# Patient Record
Sex: Female | Born: 1935 | Race: White | Hispanic: No | Smoking: Former smoker
Health system: Southern US, Community
[De-identification: ages and names within clinical notes are randomized; demographics above are authoritative.]

## PROBLEM LIST (undated history)

## (undated) DIAGNOSIS — G250 Essential tremor: Secondary | ICD-10-CM

## (undated) DIAGNOSIS — N816 Rectocele: Secondary | ICD-10-CM

## (undated) DIAGNOSIS — Z85828 Personal history of other malignant neoplasm of skin: Secondary | ICD-10-CM

## (undated) DIAGNOSIS — K5730 Diverticulosis of large intestine without perforation or abscess without bleeding: Secondary | ICD-10-CM

## (undated) DIAGNOSIS — R112 Nausea with vomiting, unspecified: Secondary | ICD-10-CM

## (undated) DIAGNOSIS — R579 Shock, unspecified: Secondary | ICD-10-CM

## (undated) DIAGNOSIS — F411 Generalized anxiety disorder: Secondary | ICD-10-CM

## (undated) DIAGNOSIS — G459 Transient cerebral ischemic attack, unspecified: Secondary | ICD-10-CM

## (undated) DIAGNOSIS — E538 Deficiency of other specified B group vitamins: Secondary | ICD-10-CM

## (undated) DIAGNOSIS — I480 Paroxysmal atrial fibrillation: Secondary | ICD-10-CM

## (undated) DIAGNOSIS — I10 Essential (primary) hypertension: Secondary | ICD-10-CM

## (undated) DIAGNOSIS — I442 Atrioventricular block, complete: Secondary | ICD-10-CM

## (undated) DIAGNOSIS — K219 Gastro-esophageal reflux disease without esophagitis: Secondary | ICD-10-CM

## (undated) DIAGNOSIS — Z87898 Personal history of other specified conditions: Secondary | ICD-10-CM

## (undated) DIAGNOSIS — Z9889 Other specified postprocedural states: Secondary | ICD-10-CM

## (undated) HISTORY — PX: TUBAL LIGATION: SHX77

## (undated) HISTORY — DX: Deficiency of other specified B group vitamins: E53.8

## (undated) HISTORY — DX: Generalized anxiety disorder: F41.1

## (undated) HISTORY — DX: Transient cerebral ischemic attack, unspecified: G45.9

## (undated) HISTORY — DX: Essential (primary) hypertension: I10

## (undated) HISTORY — DX: Atrioventricular block, complete: I44.2

## (undated) HISTORY — PX: CATARACT EXTRACTION W/ INTRAOCULAR LENS  IMPLANT, BILATERAL: SHX1307

## (undated) HISTORY — DX: Personal history of other specified conditions: Z87.898

## (undated) HISTORY — PX: OTHER SURGICAL HISTORY: SHX169

## (undated) HISTORY — DX: Paroxysmal atrial fibrillation: I48.0

## (undated) HISTORY — PX: COLONOSCOPY: SHX174

## (undated) HISTORY — DX: Shock, unspecified: R57.9

---

## 1986-03-03 HISTORY — PX: BREAST EXCISIONAL BIOPSY: SUR124

## 1993-03-03 HISTORY — PX: BREAST EXCISIONAL BIOPSY: SUR124

## 1997-10-18 ENCOUNTER — Other Ambulatory Visit: Admission: RE | Admit: 1997-10-18 | Discharge: 1997-10-18 | Payer: Self-pay | Admitting: Obstetrics and Gynecology

## 1998-05-18 ENCOUNTER — Encounter: Payer: Self-pay | Admitting: Obstetrics and Gynecology

## 1998-05-18 ENCOUNTER — Ambulatory Visit (HOSPITAL_COMMUNITY): Admission: RE | Admit: 1998-05-18 | Discharge: 1998-05-18 | Payer: Self-pay | Admitting: Obstetrics and Gynecology

## 1998-05-31 ENCOUNTER — Other Ambulatory Visit: Admission: RE | Admit: 1998-05-31 | Discharge: 1998-05-31 | Payer: Self-pay | Admitting: Obstetrics and Gynecology

## 1998-10-01 ENCOUNTER — Ambulatory Visit (HOSPITAL_COMMUNITY): Admission: RE | Admit: 1998-10-01 | Discharge: 1998-10-01 | Payer: Self-pay | Admitting: Obstetrics and Gynecology

## 1998-10-01 ENCOUNTER — Encounter: Payer: Self-pay | Admitting: Obstetrics and Gynecology

## 1998-10-05 ENCOUNTER — Other Ambulatory Visit: Admission: RE | Admit: 1998-10-05 | Discharge: 1998-10-05 | Payer: Self-pay | Admitting: Obstetrics and Gynecology

## 1998-10-05 ENCOUNTER — Encounter (INDEPENDENT_AMBULATORY_CARE_PROVIDER_SITE_OTHER): Payer: Self-pay | Admitting: Specialist

## 1999-05-21 ENCOUNTER — Encounter: Payer: Self-pay | Admitting: Obstetrics and Gynecology

## 1999-05-21 ENCOUNTER — Encounter: Admission: RE | Admit: 1999-05-21 | Discharge: 1999-05-21 | Payer: Self-pay | Admitting: Obstetrics and Gynecology

## 1999-05-24 ENCOUNTER — Other Ambulatory Visit: Admission: RE | Admit: 1999-05-24 | Discharge: 1999-05-24 | Payer: Self-pay | Admitting: Obstetrics and Gynecology

## 2000-05-15 ENCOUNTER — Other Ambulatory Visit: Admission: RE | Admit: 2000-05-15 | Discharge: 2000-05-15 | Payer: Self-pay | Admitting: Obstetrics and Gynecology

## 2000-05-22 ENCOUNTER — Encounter: Payer: Self-pay | Admitting: Obstetrics and Gynecology

## 2000-05-22 ENCOUNTER — Encounter: Admission: RE | Admit: 2000-05-22 | Discharge: 2000-05-22 | Payer: Self-pay | Admitting: Obstetrics and Gynecology

## 2000-05-26 ENCOUNTER — Encounter: Payer: Self-pay | Admitting: Obstetrics and Gynecology

## 2000-05-26 ENCOUNTER — Encounter: Admission: RE | Admit: 2000-05-26 | Discharge: 2000-05-26 | Payer: Self-pay | Admitting: Obstetrics and Gynecology

## 2001-05-17 ENCOUNTER — Other Ambulatory Visit: Admission: RE | Admit: 2001-05-17 | Discharge: 2001-05-17 | Payer: Self-pay | Admitting: Obstetrics and Gynecology

## 2001-05-28 ENCOUNTER — Encounter: Admission: RE | Admit: 2001-05-28 | Discharge: 2001-05-28 | Payer: Self-pay | Admitting: Obstetrics and Gynecology

## 2001-05-28 ENCOUNTER — Encounter: Payer: Self-pay | Admitting: Obstetrics and Gynecology

## 2001-12-29 ENCOUNTER — Encounter (INDEPENDENT_AMBULATORY_CARE_PROVIDER_SITE_OTHER): Payer: Self-pay | Admitting: Specialist

## 2001-12-29 ENCOUNTER — Ambulatory Visit (HOSPITAL_BASED_OUTPATIENT_CLINIC_OR_DEPARTMENT_OTHER): Admission: RE | Admit: 2001-12-29 | Discharge: 2001-12-29 | Payer: Self-pay | Admitting: *Deleted

## 2002-04-20 ENCOUNTER — Ambulatory Visit (HOSPITAL_COMMUNITY): Admission: RE | Admit: 2002-04-20 | Discharge: 2002-04-20 | Payer: Self-pay | Admitting: Ophthalmology

## 2002-05-04 ENCOUNTER — Ambulatory Visit (HOSPITAL_COMMUNITY): Admission: RE | Admit: 2002-05-04 | Discharge: 2002-05-04 | Payer: Self-pay | Admitting: *Deleted

## 2002-05-20 ENCOUNTER — Ambulatory Visit (HOSPITAL_COMMUNITY): Admission: RE | Admit: 2002-05-20 | Discharge: 2002-05-20 | Payer: Self-pay | Admitting: Obstetrics and Gynecology

## 2002-05-20 ENCOUNTER — Encounter: Payer: Self-pay | Admitting: Obstetrics and Gynecology

## 2002-05-31 ENCOUNTER — Encounter: Payer: Self-pay | Admitting: Obstetrics and Gynecology

## 2002-05-31 ENCOUNTER — Encounter: Admission: RE | Admit: 2002-05-31 | Discharge: 2002-05-31 | Payer: Self-pay | Admitting: Obstetrics and Gynecology

## 2003-06-02 ENCOUNTER — Encounter: Admission: RE | Admit: 2003-06-02 | Discharge: 2003-06-02 | Payer: Self-pay | Admitting: Surgery

## 2004-06-03 ENCOUNTER — Encounter: Admission: RE | Admit: 2004-06-03 | Discharge: 2004-06-03 | Payer: Self-pay | Admitting: Obstetrics and Gynecology

## 2005-06-05 ENCOUNTER — Encounter: Admission: RE | Admit: 2005-06-05 | Discharge: 2005-06-05 | Payer: Self-pay | Admitting: Obstetrics and Gynecology

## 2006-06-08 ENCOUNTER — Encounter: Admission: RE | Admit: 2006-06-08 | Discharge: 2006-06-08 | Payer: Self-pay | Admitting: Obstetrics and Gynecology

## 2007-06-10 ENCOUNTER — Encounter: Admission: RE | Admit: 2007-06-10 | Discharge: 2007-06-10 | Payer: Self-pay | Admitting: Obstetrics and Gynecology

## 2007-07-23 ENCOUNTER — Encounter: Admission: RE | Admit: 2007-07-23 | Discharge: 2007-07-23 | Payer: Self-pay | Admitting: Obstetrics and Gynecology

## 2007-07-27 ENCOUNTER — Encounter (INDEPENDENT_AMBULATORY_CARE_PROVIDER_SITE_OTHER): Payer: Self-pay | Admitting: Obstetrics and Gynecology

## 2007-07-27 ENCOUNTER — Ambulatory Visit (HOSPITAL_BASED_OUTPATIENT_CLINIC_OR_DEPARTMENT_OTHER): Admission: RE | Admit: 2007-07-27 | Discharge: 2007-07-27 | Payer: Self-pay | Admitting: Obstetrics and Gynecology

## 2007-11-26 ENCOUNTER — Encounter: Admission: RE | Admit: 2007-11-26 | Discharge: 2007-11-26 | Payer: Self-pay | Admitting: Obstetrics and Gynecology

## 2008-06-12 ENCOUNTER — Encounter: Admission: RE | Admit: 2008-06-12 | Discharge: 2008-06-12 | Payer: Self-pay | Admitting: Obstetrics and Gynecology

## 2009-06-13 ENCOUNTER — Encounter: Admission: RE | Admit: 2009-06-13 | Discharge: 2009-06-13 | Payer: Self-pay | Admitting: Internal Medicine

## 2010-03-03 DIAGNOSIS — Z9889 Other specified postprocedural states: Secondary | ICD-10-CM

## 2010-03-03 DIAGNOSIS — Z85828 Personal history of other malignant neoplasm of skin: Secondary | ICD-10-CM

## 2010-03-03 HISTORY — DX: Personal history of other malignant neoplasm of skin: Z85.828

## 2010-03-03 HISTORY — DX: Personal history of other malignant neoplasm of skin: Z98.890

## 2010-05-07 ENCOUNTER — Other Ambulatory Visit: Payer: Self-pay | Admitting: Internal Medicine

## 2010-05-07 DIAGNOSIS — Z1231 Encounter for screening mammogram for malignant neoplasm of breast: Secondary | ICD-10-CM

## 2010-06-11 ENCOUNTER — Ambulatory Visit (HOSPITAL_COMMUNITY)
Admission: RE | Admit: 2010-06-11 | Discharge: 2010-06-11 | Disposition: A | Discharge status: Home / Self Care | Payer: MEDICARE | Source: Ambulatory Visit | Attending: Surgery | Admitting: Surgery

## 2010-06-11 DIAGNOSIS — Z8371 Family history of colonic polyps: Secondary | ICD-10-CM | POA: Insufficient documentation

## 2010-06-11 DIAGNOSIS — Z83719 Family history of colon polyps, unspecified: Secondary | ICD-10-CM | POA: Insufficient documentation

## 2010-06-11 DIAGNOSIS — Z1211 Encounter for screening for malignant neoplasm of colon: Secondary | ICD-10-CM | POA: Insufficient documentation

## 2010-06-11 DIAGNOSIS — K5730 Diverticulosis of large intestine without perforation or abscess without bleeding: Secondary | ICD-10-CM | POA: Insufficient documentation

## 2010-06-17 ENCOUNTER — Ambulatory Visit
Admission: RE | Admit: 2010-06-17 | Discharge: 2010-06-17 | Disposition: A | Discharge status: Home / Self Care | Payer: MEDICARE | Source: Ambulatory Visit | Attending: Internal Medicine | Admitting: Internal Medicine

## 2010-06-17 DIAGNOSIS — Z1231 Encounter for screening mammogram for malignant neoplasm of breast: Secondary | ICD-10-CM

## 2010-06-17 NOTE — Op Note (Signed)
  NAMEDEVERY, ODWYER                ACCOUNT NO.:  [id number]  MEDICAL RECORD NO.:  [id number]           PATIENT TYPE:  O  LOCATION:  [facility]                         FACILITY:  [facility]  PHYSICIAN:  Sandria Bales. Ezzard Standing, M.D.  DATE OF BIRTH:  November 19, 1932  DATE OF PROCEDURE:  06/11/2010                              OPERATIVE REPORT   POSTOPERATIVE DIAGNOSIS:  Screening colonoscopy.  POSTOPERATIVE DIAGNOSIS:  Significant sigmoid colonic diverticulosis with rare scattered diverticula throughout colon. No mucosal lesions.  PROCEDURE:  Flexible colonoscopy.  SURGEON:  Sandria Bales. Ezzard Standing, M.D.  ANESTHESIA:  A 75 mcg of fentanyl, 6 mg of Versed.  COMPLICATIONS:  None.  INDICATIONS FOR PROCEDURE:  Ms. Sangiovanni is a 77 year old white female, patient of Dr. Buren Kos, who comes for colonoscopy.  She has never had a complete colonoscopy, she has had a sigmoidoscopy in the past. She has a son who has colonic polyps and I think this has prompted her screening colonoscopy.  The indications, potential complications were explained to the patient. The potential complications include, but are not limited to, perforation of bowel and bleeding if we were to remove a polyp.  She has completed a HalfLytely prep at home.  OPERATIVE PROCEDURE:  The patient was placed in left lateral decubitus position with IV in her right arm.  She was on 2 L nasal O2 and monitored with a pulse oximetry, EKG and blood pressure cuff.  A time out was held.  I used the Pentax flexible colonoscope and passed this upper rectum.  I had some difficulty negotiating her sigmoid colon in that she had significant sigmoid colonic diverticulosis with a fair amount of spasm of the colon wall but I was able to get through the sigmoid colon and get around to the cecum.    I visualized the ileocecal valve and cecum. The right colon had one or two scattered diverticula.  The transverse colon was unremarkable.  Left colon was unremarkable  except for an occasional diverticula.  The sigmoid colon had significant sigmoid colon diverticulosis with colonic spasm and her rectum was unremarkable.  There was, however, no mucosal lesion or polyp identified.    She is 77 years of age.  She could have her next colonoscopy in 10 years depending a lot on her overall health.  CONDITION ON DISCHARGE:  Discharge condition is good.   Sandria Bales. Ezzard Standing, M.D., FACS   DHN/MEDQ  D:  06/11/2010  T:  06/11/2010  Job:  [id number]  cc:   Kari Baars, M.D. Fax: [phone]  Malachi Pro. Ambrose Mantle, M.D. Fax: [phone]  Electronically Signed by Ovidio Kin M.D. on 06/16/2010 12:32:48 PM

## 2010-07-16 NOTE — Op Note (Signed)
Charlene Dickerson, Charlene Dickerson                ACCOUNT NO.:  [id number]   MEDICAL RECORD NO.:  [id number]          PATIENT TYPE:  AMB   LOCATION:  [facility]                         FACILITY:  [facility]   PHYSICIAN:  Malachi Pro. Ambrose Mantle, M.D. DATE OF BIRTH:  Apr 06, 1932   DATE OF PROCEDURE:  07/27/2007  DATE OF DISCHARGE:                               OPERATIVE REPORT   PREOPERATIVE DIAGNOSIS:  Cervical stenosis, endometrial lesion.   POSTOPERATIVE DIAGNOSIS:  Endometrial polyp and what appeared to be a  solid lesion on the right anterior endometrial surface.   OPERATION:  D&C hysteroscopy, removal of endometrial polyp, resection of  the right anterior endometrial lesion.   OPERATOR:  Malachi Pro. Ambrose Mantle, M.D.   ANESTHESIA:  General anesthesia.   DESCRIPTION OF PROCEDURE:  The patient was brought to the operating room  and given general anesthesia.  She was placed in lithotomy position.  The vulva, vagina and perineum were prepped with Betadine solution.  There was a moderate uterine prolapse.  The cervix came to the introitus  and there was a significant rectocele.  The rectocele was probably  second to third degree.  Exam revealed the uterus to be anterior and  normal-sized.  The adnexa were free of masses.  The cervix were quite  stenotic.  The area was then draped as a sterile field with a collection  chamber underneath the buttocks to collect the sorbitol.  The cervix was  drawn into the operative field after weighted speculum was placed  posteriorly and there was a dimple where the cervix had been.   I initially tried with a #7 Hank dilator and could not get any entry  into the endometrial cavity.  I asked for smaller dilators and adult  flexible dilators which were not effective.  So I tried again with a #7  Hank and this time I was able to enter the endometrial cavity.  I then  dilated the cervix progressively to a 30 Hank dilator and looked with  the hysteroscope and there was a large polyp  filling the entire  endometrial cavity.  I initially tried to remove it with the polyp  forceps.  That was ineffective.  I tried with the uterine dressing  forceps.  That was ineffective and finally with the small ring forceps,  I was able to grasp the polyp and pull it out as basically 1piece.  The  polyp measured about 2.5 cm long x 1-1.5 cm wide and 1.5 cm thick.   I then took another look with the hysteroscope, was able to see the  entire endometrial cavity at this point and there was a white lesion  just under the endometrial surface on the right anterior part of the  endometrium.  This appeared to be a fibroid but I went ahead and dilated  the cervix up more with Shawnie Pons dilators and used the resectoscope to  remove this lesion with a cutting current.  There was never any  bleeding.  I removed it in 3 pieces.  I then did an  endocervical curettage followed by an endometrial curettage.  The cavity  of the uterus appeared completely atrophic and without any lesions.  At  this point a picture was taken and the procedure was terminated.  Blood  loss was negligible.  The patient was returned to recovery in  satisfactory condition.      Malachi Pro. Ambrose Mantle, M.D.  Electronically Signed     TFH/MEDQ  D:  07/27/2007  T:  07/27/2007  Job:  [id number]

## 2010-07-18 ENCOUNTER — Encounter (INDEPENDENT_AMBULATORY_CARE_PROVIDER_SITE_OTHER): Payer: Self-pay | Admitting: Surgery

## 2010-11-27 LAB — COMPREHENSIVE METABOLIC PANEL
ALT: 23
Calcium: 9.9
Creatinine, Ser: 0.74
GFR calc non Af Amer: >60
Glucose, Bld: 98
Sodium: 139
Total Protein: 6.4

## 2010-11-27 LAB — CBC
Hemoglobin: 14.6
MCHC: 34.8
MCV: 96.2
RDW: 13.4

## 2011-03-28 ENCOUNTER — Ambulatory Visit (INDEPENDENT_AMBULATORY_CARE_PROVIDER_SITE_OTHER): Payer: Self-pay | Admitting: Surgery

## 2011-05-09 ENCOUNTER — Ambulatory Visit (INDEPENDENT_AMBULATORY_CARE_PROVIDER_SITE_OTHER): Payer: Medicare Other | Admitting: Surgery

## 2011-05-09 ENCOUNTER — Encounter (INDEPENDENT_AMBULATORY_CARE_PROVIDER_SITE_OTHER): Payer: Self-pay | Admitting: Surgery

## 2011-05-09 ENCOUNTER — Other Ambulatory Visit: Payer: Self-pay | Admitting: Internal Medicine

## 2011-05-09 VITALS — BP 146/88 | HR 70 | Temp 97.9°F | Resp 18 | Ht 62.5 in | Wt 149.0 lb

## 2011-05-09 DIAGNOSIS — Z1231 Encounter for screening mammogram for malignant neoplasm of breast: Secondary | ICD-10-CM

## 2011-05-09 DIAGNOSIS — N6019 Diffuse cystic mastopathy of unspecified breast: Secondary | ICD-10-CM

## 2011-05-09 HISTORY — DX: Diffuse cystic mastopathy of unspecified breast: N60.19

## 2011-05-09 NOTE — Progress Notes (Signed)
[facility]  Charlene Kin, MD,  FACS [address] [place]  [address] [place], [place]    [address] Phone:  [phone] FAX:  [phone]   Re:   Charlene Dickerson DOB:   10-Dec-1932 MRN:   [id number]  ASSESSMENT AND PLAN: 1.  Diffuse mastopathy.  Neg mammogram April 2012, she is up for mammogram next month.  Doing well.  She will see me in one year.  2.  Basal cell removed from right wrist - 02/11/2010  HISTORY OF PRESENT ILLNESS: Chief Complaint  Patient presents with  . Breast Cancer Long Term Follow Up  . Follow-up    Right wrist lesion    Charlene Dickerson is a 78 y.o. (DOB: October 07, 1932)  white female who is a patient of Charlene Baars, MD, MD and comes to me today for breast evaluation. She has a family history of breast cancer in which a sister and a daughter have had breast cancer.  Her last breast biopsy was by Charlene Dickerson of the left breast in 05/31/1993 and showed fibrocystic changes.  She is doing well.  She has no new mass or concern.  She is in good spirits.  Her son, Charlene Dickerson, had a 5 vessel bypass about 6 months ago. He's having some sternal problems now. We talked a while about Charlene Dickerson. She brought me some candy.  PHYSICAL EXAM: BP 146/88  Pulse 70  Temp(Src) 97.9 F (36.6 C) (Temporal)  Resp 18  Ht 5' 2.5" (1.588 m)  Wt 149 lb (67.586 kg)  BMI 26.82 kg/m2  HEENT:  Pupils equal.  Dentition good.  No injury. NECK:  Supple.  No thyroid mass. LYMPH NODES:  No cervical, supraclavicular, or axillary adenopathy. BREASTS -  RIGHT:  No palpable mass or nodule.  No nipple discharge.   LEFT:  No palpable mass or nodule.  No nipple discharge. UPPER EXTREMITIES:  No evidence of lymphedema.  DATA REVIEWED: Mammogram- 06/17/2010 - neg.  She gets her mammograms in April.   Charlene Kin, MD, FACS Office:  [phone]

## 2011-06-18 ENCOUNTER — Ambulatory Visit
Admission: RE | Admit: 2011-06-18 | Discharge: 2011-06-18 | Disposition: A | Discharge status: Home / Self Care | Payer: PRIVATE HEALTH INSURANCE | Source: Ambulatory Visit | Attending: Internal Medicine | Admitting: Internal Medicine

## 2011-06-18 DIAGNOSIS — Z1231 Encounter for screening mammogram for malignant neoplasm of breast: Secondary | ICD-10-CM

## 2012-05-17 ENCOUNTER — Other Ambulatory Visit: Payer: Self-pay

## 2012-05-17 DIAGNOSIS — Z1231 Encounter for screening mammogram for malignant neoplasm of breast: Secondary | ICD-10-CM

## 2012-06-18 ENCOUNTER — Ambulatory Visit
Admission: RE | Admit: 2012-06-18 | Discharge: 2012-06-18 | Disposition: A | Discharge status: Home / Self Care | Payer: Medicare Other | Source: Ambulatory Visit

## 2012-06-18 DIAGNOSIS — Z1231 Encounter for screening mammogram for malignant neoplasm of breast: Secondary | ICD-10-CM

## 2012-06-21 ENCOUNTER — Other Ambulatory Visit: Payer: Self-pay | Admitting: Internal Medicine

## 2012-06-21 DIAGNOSIS — R928 Other abnormal and inconclusive findings on diagnostic imaging of breast: Secondary | ICD-10-CM

## 2012-07-01 ENCOUNTER — Ambulatory Visit
Admission: RE | Admit: 2012-07-01 | Discharge: 2012-07-01 | Disposition: A | Discharge status: Home / Self Care | Payer: Medicare Other | Source: Ambulatory Visit | Attending: Internal Medicine | Admitting: Internal Medicine

## 2012-07-01 DIAGNOSIS — R928 Other abnormal and inconclusive findings on diagnostic imaging of breast: Secondary | ICD-10-CM

## 2012-12-01 ENCOUNTER — Encounter (INDEPENDENT_AMBULATORY_CARE_PROVIDER_SITE_OTHER): Payer: Self-pay | Admitting: Surgery

## 2012-12-01 ENCOUNTER — Ambulatory Visit (INDEPENDENT_AMBULATORY_CARE_PROVIDER_SITE_OTHER): Payer: Medicare Other | Admitting: Surgery

## 2012-12-01 VITALS — BP 130/76 | HR 67 | Temp 98.0°F | Resp 18 | Ht 63.0 in | Wt 150.0 lb

## 2012-12-01 DIAGNOSIS — N6019 Diffuse cystic mastopathy of unspecified breast: Secondary | ICD-10-CM

## 2012-12-01 NOTE — Progress Notes (Addendum)
[facility]  Ovidio Kin, MD,  FACS [address] [place]  [address] [place], [place]    [address] Phone:  [phone] FAX:  [phone]   Re:   Charlene Dickerson DOB:   1933/01/09 MRN:   [id number]  ASSESSMENT AND PLAN: 1.  Diffuse mastopathy.  Neg mammogram 08/03/12.  Doing well.  She will see me in one year.  2.  Basal cell removed from right wrist - 02/11/2010 3.  Crossed toes with nail impingement on the right.  HISTORY OF PRESENT ILLNESS: Chief Complaint  Patient presents with  . Breast Cancer Long Term Follow Up    Charlene Dickerson is a 79 y.o. (DOB: 05/22/1932)  white female who is a patient of Kari Baars, MD and comes to me today for breast evaluation.   She is doing well.  She has no new mass or concern.  She also showed me a place on her lateral lower left leg.  Breast History: She has a family history of breast cancer in which a sister and a daughter have had breast cancer.  Her last breast biopsy was by Dr. Elvina Mattes of the left breast in 05/31/1993 and showed fibrocystic changes.  She comes by herselft.  Review of Systems: Neuro:  She has had "far/near" implants for her vision and does not require glasses. GI:  I did a colonoscopy on her on 06/11/2010.  She had some diverticulosis. GYN:  Followed by Dr. Ronnell Freshwater.  Social History: Her husband, Deniece Portela, died August 03, 2008. Her son, [name], had a 5 vessel bypass around October 2012.  Her other son, Freida Busman, had 3 cardiac stents placed in 08-04-10. Her daughter, [name], and her husband, Jennette Kettle, have retired at age 74.  They are going to the beach near [place] to retire. She brought me some candy.  PHYSICAL EXAM: BP 130/76  Pulse 67  Temp(Src) 98 F (36.7 C)  Resp 18  Ht 5\' 3"  (1.6 m)  Wt 150 lb (68.04 kg)  BMI 26.58 kg/m2  HEENT:  Pupils equal.  Dentition good.  No injury.  Has slight head tremor. NECK:  Supple.  No thyroid mass. LYMPH NODES:  No cervical, supraclavicular, or axillary  adenopathy. BREASTS -  RIGHT:  No palpable mass or nodule.  No nipple discharge.  Scar at 9 o'clock.   LEFT:  No palpable mass or nodule.  No nipple discharge.  Scar at 9 o'clock and peri-areolar. UPPER EXTREMITIES:  No evidence of lymphedema.  DATA REVIEWED: Mammogram- 07/01/2012 - Negative.  0.7 cm cyst central left breast.  Ovidio Kin, MD, FACS Office:  [phone]

## 2013-05-23 ENCOUNTER — Other Ambulatory Visit: Payer: Self-pay

## 2013-05-23 DIAGNOSIS — Z1231 Encounter for screening mammogram for malignant neoplasm of breast: Secondary | ICD-10-CM

## 2013-06-20 ENCOUNTER — Ambulatory Visit
Admission: RE | Admit: 2013-06-20 | Discharge: 2013-06-20 | Disposition: A | Discharge status: Home / Self Care | Payer: Medicare Other | Source: Ambulatory Visit

## 2013-06-20 DIAGNOSIS — Z1231 Encounter for screening mammogram for malignant neoplasm of breast: Secondary | ICD-10-CM

## 2014-02-10 ENCOUNTER — Ambulatory Visit: Payer: Medicare Other | Admitting: Rehabilitative and Restorative Service Providers"

## 2014-02-14 ENCOUNTER — Ambulatory Visit: Payer: Medicare Other | Attending: Internal Medicine | Admitting: Rehabilitative and Restorative Service Providers"

## 2014-02-14 ENCOUNTER — Encounter: Payer: Self-pay | Admitting: Rehabilitative and Restorative Service Providers"

## 2014-02-14 DIAGNOSIS — R269 Unspecified abnormalities of gait and mobility: Secondary | ICD-10-CM | POA: Diagnosis present

## 2014-02-14 NOTE — Therapy (Signed)
[facility] [address] [place], [place], [address] Phone: [phone]   Fax:  [phone]  Physical Therapy Evaluation  Patient Details  Name: Charlene Dickerson MRN: [id number] Date of Birth: 07/20/1932  Encounter Date: 02/14/2014      PT End of Session - 02/14/14 1401    Visit Number 1  G code (1)   Number of Visits 8   Date for PT Re-Evaluation 03/16/14   PT Start Time 1145   PT Stop Time 1240   PT Time Calculation (min) 55 min   Activity Tolerance Patient tolerated treatment well      Past Medical History  Diagnosis Date  . Hypertension   . Osteoporosis     Past Surgical History  Procedure Laterality Date  . Breast surgery  1988    breast biopsy  . Breast surgery  1996    breast biopsy  . Basal cell lesion removed  2012    right wrist    There were no vitals taken for this visit.  Visit Diagnosis:  Abnormality of gait - Plan: PT plan of care cert/re-cert      Subjective Assessment - 02/14/14 1150    Symptoms The patient reports sudden onset of vertigo on 12/16/2013 while standing and turning.  Sensation of vertigo was accompanied by imbalance and nausea/vomitting.  Symptoms lasted for 4 hours and then began to improve.  She continues with intermittent spells of dizziness that begin with a sensation of nausea and progress to worsening balance and nausea with some reported dizziness.  Overall, she does not feel a sensation of true vertigo with room spinning.  She also notes tinnitus and a popping sensation in her head with turns.  Her symptoms have gotten worse in the past week and she is having more frequent episodes of instability.  Her dizziness is improved with meclizine and she has started taking it every 8 hours in the past week because she felt her symptoms were occuring more frequently.     Patient Stated Goals "Get rid of vertigo"   Currently in Pain? Yes   Pain Score --  not rated   Pain Location Head   Pain Orientation Right;Posterior   Pain Descriptors / Indicators Pressure   Pain Onset 1 to 4 weeks ago   Pain Frequency Several days a week   Aggravating Factors  Reading a computer use.   Pain Relieving Factors Rest.   Effect of Pain on Daily Activities "Feels like a tension headache"   Multiple Pain Sites No          [facility] PT Assessment - 02/14/14 0001    Balance Screen   Has the patient fallen in the past 6 months No   Has the patient had a decrease in activity level because of a fear of falling?  No   Is the patient reluctant to leave their home because of a fear of falling?  Yes  occasionally   Home Environment   Living Enviornment Private residence   Living Arrangements Alone   Type of Home House   Observation/Other Assessments   Focus on Therapeutic Outcomes (FOTO)  97   Other Surveys  --  DHI=26%   Ambulation/Gait   Ambulation/Gait Yes   Ambulation/Gait Assistance 7: Independent   Ambulation Distance (Feet) --  200   Gait Pattern Within Functional Limits   Standardized Balance Assessment   Standardized Balance Assessment Berg Balance Test   Balance Master Testing Sensory Organization Test  37%  equilibrium score with dependence on ground for balance   Berg Balance Test   Sit to Stand Able to stand without using hands and stabilize independently   Standing Unsupported Able to stand safely 2 minutes   Sitting with Back Unsupported but Feet Supported on Floor or Stool Able to sit safely and securely 2 minutes   Stand to Sit Sits safely with minimal use of hands   Transfers Able to transfer safely, minor use of hands   Standing Unsupported with Eyes Closed Able to stand 10 seconds safely   Standing Ubsupported with Feet Together Able to place feet together independently and stand 1 minute safely   From Standing, Reach Forward with Outstretched Arm Can reach confidently >25 cm (10")   From Standing Position, Pick up Object from Floor Able to pick up shoe safely and  easily   From Standing Position, Turn to Look Behind Over each Shoulder Looks behind from both sides and weight shifts well   Turn 360 Degrees Able to turn 360 degrees safely one side only in 4 seconds or less  Increased time to R with step to recover from loss of balanc   Standing Unsupported, Alternately Place Feet on Step/Stool Able to stand independently and safely and complete 8 steps in 20 seconds   Standing Unsupported, One Foot in Front Able to take small step independently and hold 30 seconds   Standing on One Leg Tries to lift leg/unable to hold 3 seconds but remains standing independently   Total Score 50/56 indicating low fall risk.            PT Education - 02/14/14 1400    Education provided Yes   Education Details Recommended patient take Meclizine as prescribed "as needed" and call MD if further questions on use of meds.  Home walking on level outdoor surfaces to return to prior walking program safely.   Person(s) Educated Patient   Methods Explanation   Comprehension Verbalized understanding          PT Short Term Goals - 02/14/14 1407    PT SHORT TERM GOAL #1   Title The patient will be indep with HEP for gaze x 1 viewing, high level balance and general mobility. Target date 03/16/2014.   Time 4   Period Weeks   PT SHORT TERM GOAL #2   Title The patient will improve sensory organization test from 37% up to 48 % to demo improved use of sensory systems for balance.  Target date 03/16/2014.   Time 4   Period Weeks   PT SHORT TERM GOAL #3   Title The patient will improve single limb stance to > or equal to 5 seconds bilaterally to demo improved balance control.  Target date 03/16/2014.   Time 4   Period Weeks   PT SHORT TERM GOAL #4   Title The patient will return to regular walking program per verbal report.  Target date 03/16/2014.   Time 4   Period Weeks          PT Long Term Goals - 02/14/14 1412    PT LONG TERM GOAL #1   Title The patient will be indep  with post d/c HEP for dynamic gait, high level balance and gaze activities as indicated.  Target date: 04/17/2014.   Time 8   Period Weeks   PT LONG TERM GOAL #2   Title The patient will improve sensory organization testing from 37% up to 55% to demo improving use of  sensory systems for balance.  Target date 04/03/2014.   Time 8   Period Weeks   PT LONG TERM GOAL #3   Title The patient will improve DGI by 4 points to demo improving dynamic gait.  Target date 04/03/2014.   Time 8   Period Weeks   PT LONG TERM GOAL #4   Title The patient will decrease dizziness handicap inventory from 26% down to 15% to demo improving subjective reports of dizziness. Target date 04/03/2014.   Time 8   Period Weeks          Plan - 02/14/14 1402    Clinical Impression Statement The patient is a 80 yo female with sudden onset of dizziness, imbalance and n/v in 12/2013.  Since that time, she has continued with intermittent spells of dizziness, nausea and imbalance that are hindering her abilities to participate in home walking program and regular community/social activities.  At today's evaluation, the patient was negative for positional vertigo, however she took meclizine prior to our session.  Her symptoms were most provoked by multi-sensory environments on the balance master (sensory organization testing) revealing dependence on the surface for balance and moderately diminished use of visual cues for balance with significantly diminished use of vestibular inputs for balance.  PT to reassess positional testing as indicated.  The patient also presented with decreased vestibular ocular reflex (gaze adaptations) noted through inability to gaze fixate during head turns. This may be hindering her ability to turn quickly during functional activities.                                                                                   Pt will benefit from skilled therapeutic intervention in order to improve on the following  deficits Abnormal gait;Decreased balance;Decreased mobility;Difficulty walking   Rehab Potential Good   PT Frequency 1x / week   PT Duration 8 weeks   PT Treatment/Interventions Functional mobility training;Neuromuscular re-education;Gait training;Balance training;Therapeutic exercise;Therapeutic activities;Patient/family education   PT Next Visit Plan Begin HEP: Gaze x 1 adaptation in sitting/standing, corner balance activities on compliant surfaces.  Dynamic gait training, high level balance training in clinic.   Consulted and Agree with Plan of Care Patient          G-Codes - 02/14/14 1414    Functional Assessment Tool Used Berg Balance Scale=50/56.  Sensory organization testing=37% compared to age/height normative value of 65% with significantly reduced use of visual and vestibular inputs for balance.  Dizziness handicap index=26%.     Functional Limitation Mobility: Walking and moving around   Mobility: Walking and Moving Around Current Status [phone](G8978) At least 1 percent but less than 20 percent impaired, limited or restricted   Mobility: Walking and Moving Around Goal Status [phone](G8979) At least 1 percent but less than 20 percent impaired, limited or restricted             Vestibular Assessment - 02/14/14 1202    Type of Dizziness Unsteady with head/body turns   Frequency of Dizziness frequent episodes   Duration of Dizziness minutes to hours   Aggravating Factors Turning body quickly;Turning head quickly   Relieving Factors Head stationary   Occulomotor Alignment Normal  Spontaneous Absent   Gaze-induced Absent   Smooth Pursuits Intact   Saccades Intact   VOR 1 Head Only (x 1 viewing) --  Unable to maintain gaze x 1 viewing with head thrust test Bilat   VOR to Slow Head Movement Positive bilaterally  difficult to maintain gaze on target with head movements   Dix-Hallpike Dix-Hallpike Right;Dix-Hallpike Left   Sidelying Test Sidelying Right;Sidelying Left   Horizontal  Canal Testing Horizontal Canal Right;Horizontal Canal Left   Dix-Hallpike Right Symptoms No nystagmus  No dizziness   Dix-Hallpike Left Symptoms No nystagmus  No dizziness   Sidelying Right Symptoms No nystagmus  no dizziness   Sidelying Left Symptoms No nystagmus  no dizziness   Horizontal Canal Right Symptoms Normal   Horizontal Canal Left Symptoms Normal      SELF CARE/HOME MANAGEMENT: Discussed safe return to home walking program and results from evaluation with recommendations to avoid unlevel ground until after further activity/exercise in the clinic.  Problem List Patient Active Problem List   Diagnosis Date Noted  . Diffuse cystic mastopathy of breast 05/09/2011     Thank you for the referral of this patient.   Margretta Ditty, PT, MPT 02/14/2014 2:21 PM [facility] Phone: [phone] Fax: [phone]  Riann Oman 02/14/2014, 2:17 PM

## 2014-02-22 ENCOUNTER — Encounter: Payer: Self-pay | Admitting: Rehabilitative and Restorative Service Providers"

## 2014-02-22 ENCOUNTER — Ambulatory Visit: Payer: Medicare Other | Admitting: Rehabilitative and Restorative Service Providers"

## 2014-02-22 DIAGNOSIS — R269 Unspecified abnormalities of gait and mobility: Secondary | ICD-10-CM

## 2014-02-22 NOTE — Patient Instructions (Signed)
  Gaze Stabilization: Tip Card 1.Target must remain in focus, not blurry, and appear stationary while head is in motion. 2.Perform exercises with small head movements (45 to either side of midline). 3.Increase speed of head motion so long as target is in focus. 4.If you wear eyeglasses, be sure you can see target through lens (therapist will give specific instructions for bifocal / progressive lenses). 5.These exercises may provoke dizziness or nausea. Work through these symptoms. If too dizzy, slow head movement slightly. Rest between each exercise. 6.Exercises demand concentration; avoid distractions. 7.For safety, perform standing exercises close to a counter, wall, corner, or next to someone.  Copyright  VHI. All rights reserved.  Gaze Stabilization: Standing Feet Apart   Feet shoulder width apart, keeping eyes on target on wall __3__ feet away, tilt head down 15-30 and move head side to side for __30__ seconds.  Do __2__ sessions per day.  Copyright  VHI. All rights reserved.  Feet Together (Compliant Surface) Varied Arm Positions - Eyes Closed   Stand on compliant surface: pillow with feet together and arms by your side. Close eyes and visualize upright position. Hold__30__ seconds. Repeat _3___ times per session. Do _2___ sessions per day.  Copyright  VHI. All rights reserved.  Feet Apart (Compliant Surface) Head Motion - Eyes Open   With eyes open, standing on compliant surface: __pillow_, feet shoulder width apart, move head slowly: side to side. Repeat __5__ times per session. Do _2___ sessions per day.  Copyright  VHI. All rights reserved.  Horizontal Abduction (Resistive Band)   With arms at shoulder level, keep elbows straight. HOLD THERABAND IN YOUR HANDS WITH PALMS UP.  Move hands apart bringing your arms out to the sides.  Repeat _10_ times. Do __2__ sessions per day.  Copyright  VHI. All rights reserved.

## 2014-02-22 NOTE — Therapy (Signed)
[facility] [address] [place], [place], [address] Phone: [phone]   Fax:  [phone]  Physical Therapy Treatment  Patient Details  Name: Charlene Dickerson MRN: [id number] Date of Birth: 10-Jan-1933  Encounter Date: 02/22/2014      PT End of Session - 02/22/14 1503    Visit Number 2  G code (2)   Number of Visits 8   Date for PT Re-Evaluation 03/16/14   PT Start Time 1405   PT Stop Time 1450   PT Time Calculation (min) 45 min   Activity Tolerance Patient tolerated treatment well      Past Medical History  Diagnosis Date  . Hypertension   . Osteoporosis     Past Surgical History  Procedure Laterality Date  . Breast surgery  1988    breast biopsy  . Breast surgery  1996    breast biopsy  . Basal cell lesion removed  2012    right wrist    There were no vitals taken for this visit.  Visit Diagnosis:  Abnormality of gait      Subjective Assessment - 02/22/14 1410    Symptoms The patient reports improvement since last session.  She has not taken any meclizine.  She reports no dizziness with bed mobility.  She has experienced passing symptoms of nausea and dizziness, but they lasted  short periods and cleared quickly.    Currently in Pain? Yes   Pain Score 0-No pain   Pain Location Head   Pain Orientation Right;Posterior   Pain Descriptors / Indicators Pressure   Pain Onset 1 to 4 weeks ago   Pain Frequency Several days a week   Aggravating Factors  reading and computer use.     NEUROMUSCULAR RE-EDUCATION: Gaze x 1 viewing x 30 seconds horiz/vertical x 3 repetitions. Corner balance with feet apart eyes closed, progressing to feet together. Corner balance on foam with head turns. Posture re-education with scapular retraction.  Gait: Ambulation with head turns in horiz and vertical planes without loss of balance. Discussed return to home walking program with patient and she plans to have friend  walk with her when initially [place].           PT Education - 02/22/14 1501    Education provided Yes   Education Details HEP: Gaze x 1 viewing, feet together with eyes closed, feet apart head turns, and scapular retraction.   Person(s) Educated Patient   Methods Explanation   Comprehension Verbalized understanding          PT Short Term Goals - 02/14/14 1407    PT SHORT TERM GOAL #1   Title The patient will be indep with HEP for gaze x 1 viewing, high level balance and general mobility. Target date 03/16/2014.   Time 4   Period Weeks   PT SHORT TERM GOAL #2   Title The patient will improve sensory organization test from 37% up to 48 % to demo improved use of sensory systems for balance.  Target date 03/16/2014.   Time 4   Period Weeks   PT SHORT TERM GOAL #3   Title The patient will improve single limb stance to > or equal to 5 seconds bilaterally to demo improved balance control.  Target date 03/16/2014.   Time 4   Period Weeks   PT SHORT TERM GOAL #4   Title The patient will return to regular walking program per verbal report.  Target date 03/16/2014.   Time 4   Period Weeks           PT Long Term Goals - 02/14/14 1412    PT LONG TERM GOAL #1   Title The patient will be indep with post d/c HEP for dynamic gait, high level balance and gaze activities as indicated.  Target date: 04/17/2014.   Time 8   Period Weeks   PT LONG TERM GOAL #2   Title The patient will improve sensory organization testing from 37% up to 55% to demo improving use of sensory systems for balance.  Target date 04/03/2014.   Time 8   Period Weeks   PT LONG TERM GOAL #3   Title The patient will improve DGI by 4 points to demo improving dynamic gait.  Target date 04/03/2014.   Time 8   Period Weeks   PT LONG TERM GOAL #4   Title The patient will decrease dizziness handicap inventory from 26% down to 15% to demo improving subjective reports of dizziness. Target date  04/03/2014.   Time 8   Period Weeks               Plan - 02/22/14 1503    Clinical Impression Statement The patient subjectively reports significantly improved symptoms since last session. PT added HEP to improve use of vestibular inputs and balance, as well as postural exercise.   PT Next Visit Plan Check HEP; d/c if patient returned to prior walking program and without subjective reports of vertigo.   Consulted and Agree with Plan of Care Patient        Problem List Patient Active Problem List   Diagnosis Date Noted  . Diffuse cystic mastopathy of breast 05/09/2011    Charlene Dickerson, PT 02/22/2014, 3:06 PM  [place] [address] [place], [place], [address] Phone: [phone]   Fax:  [phone]

## 2014-03-10 ENCOUNTER — Ambulatory Visit: Payer: Medicare Other | Attending: Internal Medicine | Admitting: Rehabilitative and Restorative Service Providers"

## 2014-03-10 ENCOUNTER — Encounter: Payer: Self-pay | Admitting: Rehabilitative and Restorative Service Providers"

## 2014-03-10 DIAGNOSIS — R269 Unspecified abnormalities of gait and mobility: Secondary | ICD-10-CM | POA: Diagnosis not present

## 2014-03-10 NOTE — Therapy (Signed)
[place] [address] [place], [place], [address] Phone: [phone]   Fax:  [phone]  Patient Details  Name: Charlene Dickerson MRN: [id number] Date of Birth: 1932/08/28 Referring Provider:  No ref. provider found  Encounter Date: 03/10/2014 PHYSICAL THERAPY DISCHARGE SUMMARY  Visits from Start of Care: 3  Current functional level related to goals / functional outcomes: PT Short Term Goals - 03/10/14 1540    PT SHORT TERM GOAL #1   Title The patient will be indep with HEP for gaze x 1 viewing, high level balance and general mobility. Target date 03/16/2014.   Baseline Goal met 03/10/2014-pt indep with HEP.   Time 4   Period Weeks   Status Achieved   PT SHORT TERM GOAL #2   Title The patient will improve sensory organization test from 37% up to 48 % to demo improved use of sensory systems for balance. Target date 03/16/2014.   Baseline Goal met on 03/10/2014- patient improved from 37% on sensory organization testing up to 62%. Normative value for age/height is 64%.   Time 4   Period Weeks   Status Achieved   PT SHORT TERM GOAL #3   Title The patient will improve single limb stance to > or equal to 5 seconds bilaterally to demo improved balance control. Target date 03/16/2014.   Baseline Goal met on 03/10/2014.   Time 4   Period Weeks   Status Achieved   PT SHORT TERM GOAL #4   Title The patient will return to regular walking program per verbal report. Target date 03/16/2014.   Baseline Goal met 03/10/2014. Pt has returned to walking in the park for exercise. She has also walked [place] a [place] and across unlevel parking lots without difficulty.   Time 4   Period Weeks   Status Achieved           PT Long Term Goals - 03/10/14 1615    PT LONG TERM GOAL #1   Title The patient will be indep with post d/c HEP for dynamic gait, high level  balance and gaze activities as indicated. Target date: 04/17/2014.   Baseline Goal met 03/10/2014.   Time 8   Period Weeks   Status Achieved   PT LONG TERM GOAL #2   Title The patient will improve sensory organization testing from 37% up to 55% to demo improving use of sensory systems for balance. Target date 04/03/2014.   Baseline Goal met 03/10/2014-see STG for status.   Time 8   Period Weeks   Status Achieved   PT LONG TERM GOAL #3   Title The patient will improve DGI by 4 points to demo improving dynamic gait. Target date 04/03/2014.   Baseline Patient demonstrated fast progress and DGI was not performed due to patient's return to prior walking program.   Time 8   Period Weeks   Status Deferred   PT LONG TERM GOAL #4   Title The patient will decrease dizziness handicap inventory from 26% down to 15% to demo improving subjective reports of dizziness. Target date 04/03/2014.   Baseline Goal met 03/10/2014 with patient improving DHI from 26% down to 0% indicating resolution of dizziness.   Time 8   Period Weeks   Status Achieved            Plan - 03/10/14 1626    Clinical Impression Statement The patient met 4/4 STGs and 3/4 LTGs. She has returned to prior activity level and is  independent with HEP to continue progressing high level balance activities. The patient is motivated to return to her prior activity level.    PT Next Visit Plan d/c from PT today.   Consulted and Agree with Plan of Care Patient        Remaining deficits: Addressed via HEP.   Education / Equipment: HEP, home walking program.  Plan: Patient agrees to discharge.  Patient goals were met. Patient is being discharged due to meeting the stated rehab goals.  ?????    Thank you for the referral of this patient.    [place], PT 03/10/2014, 4:36 PM

## 2014-03-10 NOTE — Patient Instructions (Signed)
Single Leg - Eyes Open   Holding support, lift right leg while maintaining balance over other leg. Progress to removing hands from support surface for longer periods of time. Hold_10___ seconds. Repeat __3__ times per session. Do __1-2__ sessions per day.  Copyright  VHI. All rights reserved.  Feet Heel-Toe "Tandem"   Arms outstretched, stand with feet positioned in a straight line.  Hold 30 seconds.  Switch feet and do 3 times with each foot forward. 1-2 times/day. Copyright  VHI. All rights reserved.

## 2014-03-10 NOTE — Therapy (Signed)
[facility] [address] [place], [place], [address] Phone: [phone]   Fax:  [phone]  Physical Therapy Treatment  Patient Details  Name: Charlene Dickerson MRN: [id number] Date of Birth: Jul 17, 1932 Referring Provider:  Marton Redwood, MD  Encounter Date: 03/10/2014      PT End of Session - 03/10/14 1614    Visit Number 3  G code (3)   Number of Visits 8   Date for PT Re-Evaluation 03/16/14   PT Start Time 1535   PT Stop Time 1610   PT Time Calculation (min) 35 min   Activity Tolerance Patient tolerated treatment well      Past Medical History  Diagnosis Date  . Hypertension   . Osteoporosis     Past Surgical History  Procedure Laterality Date  . Breast surgery  1988    breast biopsy  . Breast surgery  1996    breast biopsy  . Basal cell lesion removed  2012    right wrist    There were no vitals taken for this visit.  Visit Diagnosis:  Abnormality of gait      Subjective Assessment - 03/10/14 1538    Symptoms The patient reports no dizziness and she has returned to prior level of function.     Currently in Pain? No/denies          [facility] PT Assessment - 03/10/14 1543    Standardized Balance Assessment   Standardized Balance Assessment Berg Balance Test;Dynamic Gait Index   Berg Balance Test   Sit to Stand Able to stand without using hands and stabilize independently   Standing Unsupported Able to stand safely 2 minutes   Sitting with Back Unsupported but Feet Supported on Floor or Stool Able to sit safely and securely 2 minutes   Stand to Sit Sits safely with minimal use of hands   Transfers Able to transfer safely, minor use of hands   Standing Unsupported with Eyes Closed Able to stand 10 seconds safely   Standing Ubsupported with Feet Together Able to place feet together independently and stand 1 minute safely   From Standing, Reach Forward with Outstretched Arm Can reach confidently >25  cm (10")   From Standing Position, Pick up Object from Floor Able to pick up shoe safely and easily   From Standing Position, Turn to Look Behind Over each Shoulder Looks behind from both sides and weight shifts well   Turn 360 Degrees Able to turn 360 degrees safely in 4 seconds or less   Standing Unsupported, Alternately Place Feet on Step/Stool Able to stand independently and safely and complete 8 steps in 20 seconds   Standing Unsupported, One Foot in Front Able to plae foot ahead of the other independently and hold 30 seconds   Standing on One Leg Able to lift leg independently and hold 5-10 seconds   Total Score 54/56 indicating low fall risk.      NEUROMUSCULAR RE-EDUCATION: Berg=54/56 Sensory organization testing=62% with WNLs use of sensory systems for balance. Reviewed prior HEP and progressed to include single limb and tandem stance.  SELF CARE/HOME MANAGEMENT: Recommended patient continue to progress home walking program and continue current HEP until activities are easy to perform.        PT Education - 03/10/14 1613    Education provided Yes   Education Details HEP: single leg stance and tandem stance.   Person(s) Educated Patient   Methods Explanation   Comprehension Verbalized understanding  PT Short Term Goals - 03/10/14 1540    PT SHORT TERM GOAL #1   Title The patient will be indep with HEP for gaze x 1 viewing, high level balance and general mobility. Target date 03/16/2014.   Baseline Goal met 03/10/2014-pt indep with HEP.   Time 4   Period Weeks   Status Achieved   PT SHORT TERM GOAL #2   Title The patient will improve sensory organization test from 37% up to 48 % to demo improved use of sensory systems for balance.  Target date 03/16/2014.   Baseline Goal met on 03/10/2014- patient improved from 37% on sensory organization testing up to 62%.  Normative value for age/height is 64%.   Time 4   Period Weeks   Status Achieved   PT SHORT TERM GOAL #3    Title The patient will improve single limb stance to > or equal to 5 seconds bilaterally to demo improved balance control.  Target date 03/16/2014.   Baseline Goal met on 03/10/2014.   Time 4   Period Weeks   Status Achieved   PT SHORT TERM GOAL #4   Title The patient will return to regular walking program per verbal report.  Target date 03/16/2014.   Baseline Goal met 03/10/2014.  Pt has returned to walking in the park for exercise. She has also walked [place] and across un[place] parking lots without difficulty.   Time 4   Period Weeks   Status Achieved           PT Long Term Goals - 03/10/14 1615    PT LONG TERM GOAL #1   Title The patient will be indep with post d/c HEP for dynamic gait, high level balance and gaze activities as indicated.  Target date: 04/17/2014.   Baseline Goal met 03/10/2014.   Time 8   Period Weeks   Status Achieved   PT LONG TERM GOAL #2   Title The patient will improve sensory organization testing from 37% up to 55% to demo improving use of sensory systems for balance.  Target date 04/03/2014.   Baseline Goal met 03/10/2014-see STG for status.   Time 8   Period Weeks   Status Achieved   PT LONG TERM GOAL #3   Title The patient will improve DGI by 4 points to demo improving dynamic gait.  Target date 04/03/2014.   Baseline Patient demonstrated fast progress and DGI was not performed due to patient's return to prior walking program.   Time 8   Period Weeks   Status Deferred   PT LONG TERM GOAL #4   Title The patient will decrease dizziness handicap inventory from 26% down to 15% to demo improving subjective reports of dizziness. Target date 04/03/2014.   Baseline Goal met 03/10/2014 with patient improving DHI from 26% down to 0% indicating resolution of dizziness.   Time 8   Period Weeks   Status Achieved               Plan - 03/10/14 1626    Clinical Impression Statement The patient met 4/4 STGs and 3/4 LTGs.  She has returned to prior activity  level and is independent with HEP to continue progressing high level balance activities.  The patient is motivated to return to her prior activity level.    PT Next Visit Plan d/c from PT today.   Consulted and Agree with Plan of Care Patient          G-Codes -  03/10/14 1627    Functional Assessment Tool Used Berg=54/56, SOT=62%, dizziness handicap index=0%   Functional Limitation Mobility: Walking and moving around   Mobility: Walking and Moving Around Goal Status [phone]) At least 1 percent but less than 20 percent impaired, limited or restricted   Mobility: Walking and Moving Around Discharge Status [phone]) At least 1 percent but less than 20 percent impaired, limited or restricted      Problem List Patient Active Problem List   Diagnosis Date Noted  . Diffuse cystic mastopathy of breast 05/09/2011   Thank you for the referral of this patient.   L[place], [place] 03/10/2014, 4:30 PM,  [place] [address] [place], [place], [address] Phone: [phone]   Fax:  [phone]

## 2014-03-17 ENCOUNTER — Encounter: Payer: Medicare Other | Admitting: Rehabilitative and Restorative Service Providers"

## 2014-03-22 ENCOUNTER — Encounter: Payer: Medicare Other | Admitting: Rehabilitative and Restorative Service Providers"

## 2014-04-12 ENCOUNTER — Other Ambulatory Visit: Payer: Self-pay | Admitting: Surgery

## 2014-05-18 ENCOUNTER — Other Ambulatory Visit: Payer: Self-pay

## 2014-05-18 DIAGNOSIS — Z1231 Encounter for screening mammogram for malignant neoplasm of breast: Secondary | ICD-10-CM

## 2014-06-22 ENCOUNTER — Other Ambulatory Visit: Payer: Self-pay | Admitting: Surgery

## 2014-06-23 ENCOUNTER — Encounter (INDEPENDENT_AMBULATORY_CARE_PROVIDER_SITE_OTHER): Payer: Self-pay

## 2014-06-23 ENCOUNTER — Ambulatory Visit
Admission: RE | Admit: 2014-06-23 | Discharge: 2014-06-23 | Disposition: A | Discharge status: Home / Self Care | Payer: Medicare Other | Source: Ambulatory Visit

## 2014-06-23 DIAGNOSIS — Z1231 Encounter for screening mammogram for malignant neoplasm of breast: Secondary | ICD-10-CM

## 2014-06-26 ENCOUNTER — Other Ambulatory Visit: Payer: Self-pay | Admitting: Obstetrics and Gynecology

## 2014-06-26 DIAGNOSIS — R928 Other abnormal and inconclusive findings on diagnostic imaging of breast: Secondary | ICD-10-CM

## 2014-06-30 ENCOUNTER — Ambulatory Visit
Admission: RE | Admit: 2014-06-30 | Discharge: 2014-06-30 | Disposition: A | Discharge status: Home / Self Care | Payer: Medicare Other | Source: Ambulatory Visit | Attending: Obstetrics and Gynecology | Admitting: Obstetrics and Gynecology

## 2014-06-30 DIAGNOSIS — R928 Other abnormal and inconclusive findings on diagnostic imaging of breast: Secondary | ICD-10-CM

## 2015-05-23 ENCOUNTER — Other Ambulatory Visit: Payer: Self-pay

## 2015-05-23 DIAGNOSIS — Z1231 Encounter for screening mammogram for malignant neoplasm of breast: Secondary | ICD-10-CM

## 2015-06-26 ENCOUNTER — Ambulatory Visit
Admission: RE | Admit: 2015-06-26 | Discharge: 2015-06-26 | Disposition: A | Discharge status: Home / Self Care | Payer: Medicare Other | Source: Ambulatory Visit

## 2015-06-26 DIAGNOSIS — Z1231 Encounter for screening mammogram for malignant neoplasm of breast: Secondary | ICD-10-CM

## 2016-05-20 ENCOUNTER — Other Ambulatory Visit: Payer: Self-pay | Admitting: Internal Medicine

## 2016-05-20 DIAGNOSIS — Z1231 Encounter for screening mammogram for malignant neoplasm of breast: Secondary | ICD-10-CM

## 2016-06-26 ENCOUNTER — Ambulatory Visit
Admission: RE | Admit: 2016-06-26 | Discharge: 2016-06-26 | Disposition: A | Discharge status: Home / Self Care | Payer: Medicare Other | Source: Ambulatory Visit | Attending: Internal Medicine | Admitting: Internal Medicine

## 2016-06-26 DIAGNOSIS — Z1231 Encounter for screening mammogram for malignant neoplasm of breast: Secondary | ICD-10-CM

## 2017-05-20 ENCOUNTER — Other Ambulatory Visit: Payer: Self-pay | Admitting: Internal Medicine

## 2017-05-20 DIAGNOSIS — Z1231 Encounter for screening mammogram for malignant neoplasm of breast: Secondary | ICD-10-CM

## 2017-07-08 ENCOUNTER — Ambulatory Visit
Admission: RE | Admit: 2017-07-08 | Discharge: 2017-07-08 | Disposition: A | Discharge status: Home / Self Care | Payer: Medicare Other | Source: Ambulatory Visit | Attending: Internal Medicine | Admitting: Internal Medicine

## 2017-07-08 DIAGNOSIS — Z1231 Encounter for screening mammogram for malignant neoplasm of breast: Secondary | ICD-10-CM

## 2017-10-25 ENCOUNTER — Other Ambulatory Visit: Payer: Self-pay | Admitting: Obstetrics and Gynecology

## 2017-11-19 ENCOUNTER — Encounter (HOSPITAL_BASED_OUTPATIENT_CLINIC_OR_DEPARTMENT_OTHER): Payer: Self-pay | Admitting: *Deleted

## 2017-11-19 ENCOUNTER — Other Ambulatory Visit: Payer: Self-pay

## 2017-11-19 NOTE — Progress Notes (Addendum)
Spoke w/ pt via phone for pre-op interview.  Npo after mn.  Arrive at [facility].  Will take propranolol am dos w/ sips of water.  Getting CBCdiff, cmet, t&s, and ekg done Monday 11-23-2017.  Reviewed [facility] guidelines, asked pt to bring home meds in prescription bottles.  Received pt pcp clearance via fax from dr Ambrose Mantle, placed w/ chart.

## 2017-11-23 ENCOUNTER — Encounter (HOSPITAL_COMMUNITY)
Admission: RE | Admit: 2017-11-23 | Discharge: 2017-11-23 | Disposition: A | Discharge status: Home / Self Care | Payer: Medicare Other | Source: Ambulatory Visit | Attending: Obstetrics and Gynecology | Admitting: Obstetrics and Gynecology

## 2017-11-23 DIAGNOSIS — Z800 Family history of malignant neoplasm of digestive organs: Secondary | ICD-10-CM | POA: Diagnosis not present

## 2017-11-23 DIAGNOSIS — I10 Essential (primary) hypertension: Secondary | ICD-10-CM | POA: Diagnosis not present

## 2017-11-23 DIAGNOSIS — N816 Rectocele: Secondary | ICD-10-CM | POA: Insufficient documentation

## 2017-11-23 DIAGNOSIS — Z01818 Encounter for other preprocedural examination: Secondary | ICD-10-CM

## 2017-11-23 DIAGNOSIS — Z85828 Personal history of other malignant neoplasm of skin: Secondary | ICD-10-CM | POA: Diagnosis not present

## 2017-11-23 DIAGNOSIS — Z881 Allergy status to other antibiotic agents status: Secondary | ICD-10-CM | POA: Diagnosis not present

## 2017-11-23 DIAGNOSIS — K219 Gastro-esophageal reflux disease without esophagitis: Secondary | ICD-10-CM | POA: Diagnosis not present

## 2017-11-23 DIAGNOSIS — Z79899 Other long term (current) drug therapy: Secondary | ICD-10-CM | POA: Diagnosis not present

## 2017-11-23 DIAGNOSIS — H409 Unspecified glaucoma: Secondary | ICD-10-CM | POA: Diagnosis not present

## 2017-11-23 DIAGNOSIS — Z803 Family history of malignant neoplasm of breast: Secondary | ICD-10-CM | POA: Diagnosis not present

## 2017-11-23 DIAGNOSIS — R159 Full incontinence of feces: Secondary | ICD-10-CM | POA: Diagnosis not present

## 2017-11-23 DIAGNOSIS — M8580 Other specified disorders of bone density and structure, unspecified site: Secondary | ICD-10-CM | POA: Diagnosis not present

## 2017-11-23 DIAGNOSIS — Z87891 Personal history of nicotine dependence: Secondary | ICD-10-CM | POA: Diagnosis not present

## 2017-11-23 DIAGNOSIS — N8110 Cystocele, unspecified: Secondary | ICD-10-CM | POA: Diagnosis not present

## 2017-11-23 LAB — COMPREHENSIVE METABOLIC PANEL
ALT: 20 U/L (ref 0–44)
AST: 21 U/L (ref 15–41)
Albumin: 3.9 g/dL (ref 3.5–5.0)
Alkaline Phosphatase: 31 U/L — ABNORMAL LOW (ref 38–126)
Anion gap: 8 (ref 5–15)
BUN: 19 mg/dL (ref 8–23)
CALCIUM: 10.1 mg/dL (ref 8.9–10.3)
CHLORIDE: 103 mmol/L (ref 98–111)
CO2: 30 mmol/L (ref 22–32)
Creatinine, Ser: 0.91 mg/dL (ref 0.44–1.00)
GFR calc Af Amer: >60 mL/min (ref >60)
GFR calc non Af Amer: 57 mL/min — ABNORMAL LOW (ref >60)
Glucose, Bld: 87 mg/dL (ref 70–99)
POTASSIUM: 4.2 mmol/L (ref 3.5–5.1)
SODIUM: 141 mmol/L (ref 135–145)
TOTAL PROTEIN: 6.8 g/dL (ref 6.5–8.1)
Total Bilirubin: 0.7 mg/dL (ref 0.3–1.2)

## 2017-11-23 LAB — CBC WITH DIFFERENTIAL/PLATELET
BASOS ABS: 0 10*3/uL (ref 0.0–0.1)
Basophils Relative: 0 %
EOS ABS: 0.1 10*3/uL (ref 0.0–0.7)
EOS PCT: 2 %
HCT: 41.3 % (ref 36.0–46.0)
Hemoglobin: 13.6 g/dL (ref 12.0–15.0)
LYMPHS PCT: 27 %
Lymphs Abs: 1.3 10*3/uL (ref 0.7–4.0)
MCH: 32.9 pg (ref 26.0–34.0)
MCHC: 32.9 g/dL (ref 30.0–36.0)
MCV: 99.8 fL (ref 78.0–100.0)
Monocytes Absolute: 0.5 10*3/uL (ref 0.1–1.0)
Monocytes Relative: 9 %
Neutro Abs: 3 10*3/uL (ref 1.7–7.7)
Neutrophils Relative %: 62 %
PLATELETS: 210 10*3/uL (ref 150–400)
RBC: 4.14 MIL/uL (ref 3.87–5.11)
RDW: 13 % (ref 11.5–15.5)
WBC: 4.9 10*3/uL (ref 4.0–10.5)

## 2017-11-24 LAB — ABO/RH: ABO/RH(D): O POS

## 2017-11-24 NOTE — H&P (Signed)
NAME: Charlene Dickerson, Charlene Dickerson MEDICAL RECORD RX:[id number] ACCOUNT [id number] DATE OF BIRTH:[birth year] FACILITY: [facility] LOCATION: [facility] PHYSICIAN:Ainsleigh Kakos Corwin Levins, MD  HISTORY AND PHYSICAL  DATE OF ADMISSION:  11/26/2017  HISTORY OF PRESENT ILLNESS:  This is an 84 year old white female, para 3-0-0-3, who was admitted to the hospital for repair of a rectocele that she considers very symptomatic.  The patient has been noted to have a 4th degree rectocele.  Since 09/2015 she  did not complain significantly about the problem until 10/07/2017, when she came in for a breast and pelvic exam and stated categorically that she wanted her rectocele repaired.  She said that it was bothering her to sit down.  She could feel the bulge  when she sat.  She did not have a complaint of digital manipulation of her stool.  She said that when she would take a mirror and look at it, she did not like the idea that part of her body was protruding out through her vaginal introitus.  She does have  fecal incontinence.  She says that she has fecal incontinence a couple of times a week, sometimes it is with an urge, sometimes she just finds stool in her panties.  I advised her without any equivocation, that I would not expect that to necessarily  improve with repair of the rectocele.  I have offered her a visit to see a rectal surgeon and she declined.  She wants the rectocele repaired.  She understands that it will not necessarily favorably impact on this fecal incontinence.  She has a small  cystocele, but I do not want to repair the cystocele because it is asymptomatic and because I want to have full access to her cervix and she hopes to have sexual activity in the future and that would probably decrease the likelihood of successful  intercourse.  She was offered a pessary to insert to see if that would improve her symptoms and she has no desire to use a pessary.  PAST MEDICAL HISTORY:  Reveals  ALLERGY TO CLINDAMYCIN.  She  states that it caused C. difficile colitis.  MEDICATIONS:  She uses 0.25 mg of alprazolam on a p.r.n. basis.  She takes vitamin D, hydrochlorothiazide 25 mg daily.  She takes latanoprost eyedrops 0.005%, one drop in both eyes every day, losartan 100 mg every day, propranolol 10 mg twice a day.  FAMILY HISTORY:  Her sister has cancer of the breast and a cancer of the pancreas, died at age 65.  Maternal grandmother tumor of the breast, malignant.  Maternal aunt with malignant tumor of the breast and a daughter with malignant tumor of the breast.  SOCIAL HISTORY:  The patient does not smoke, never has smoked.  She drinks occasionally.  Denies illicit drugs.  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  She is widowed.  PAST SURGICAL HISTORY:  She had a T and A.  She had an endometrial biopsy.  She had a benign endometrial polyp removed at hysteroscopy.  Her last colonoscopy was in 06/2013.  Tubal ligation in 1974.  Last mammogram was in 07/2017.  Pap smear was done in  2014 and was normal.  She had 3 vaginal deliveries from [phone].  She had a basal cell carcinoma removed from her right wrist.  She has high blood pressure.  She has had osteopenia and she has had a workup for microscopic hematuria.  She also has  glaucoma.  PHYSICAL EXAMINATION: VITAL SIGNS:  Blood pressure is 146/90, pulse is 70,  5 feet 2 inches, 149 pounds.  BMI is 27.3.   HEENT:  No cranial abnormalities.  Extraocular movements are intact.  Nose and pharynx are clear. NECK:  Supple, without thyromegaly. HEART:  Normal size and sounds, no murmurs. LUNGS:  Clear to auscultation. BREASTS:  Soft, without masses. EXTREMITIES:  There is a prior biopsy scar in each breast.   ABDOMEN:  Soft and nontender.  No masses are palpable.   PELVIC:  The vulva is free of lesions.  Labia majora and minora are normal.  Introitus is normal.  The Bartholin glands are normal.  There is a stage IV rectocele, much smaller cystocele, stage II is present.   Uterus is normal size.  Cervix is clean.   Adnexa:  The left pelvis is thickened as it has been since 2013.  ASSESSMENT AND PLAN:  Fourth-degree rectocele, but smaller cystocele.    The patient has been advised that a planned repair of the rectocele and the cystocele only if that seems to be protruding quite a bit.  I want to preserve access to her cervix and want to preserve her vagina as much as possible for future sexual  activity, if the situation arises.  She has glaucoma, high blood pressure.  She has a history of having basal cell carcinoma removed from her skin patient has been counseled about the risks of surgery including but not limited to heart and lung problems,  hemorrhage with need for reoperation and transfusion, especially that rectocele is very close to her vagina and injury to the rectum is possible requiring repair and possible development of a rectovaginal fistula.  She understands these risks and is  ready to proceed.  She is very clear in her desire to eliminate the rectocele.  AN/NUANCE  D:11/23/2017 T:11/23/2017 JOB:[id number]

## 2017-11-26 ENCOUNTER — Ambulatory Visit (HOSPITAL_BASED_OUTPATIENT_CLINIC_OR_DEPARTMENT_OTHER): Payer: Medicare Other | Admitting: Anesthesiology

## 2017-11-26 ENCOUNTER — Encounter (HOSPITAL_BASED_OUTPATIENT_CLINIC_OR_DEPARTMENT_OTHER)
Admission: RE | Disposition: A | Discharge status: Home / Self Care | Payer: Self-pay | Source: Ambulatory Visit | Attending: Obstetrics and Gynecology

## 2017-11-26 ENCOUNTER — Encounter (HOSPITAL_BASED_OUTPATIENT_CLINIC_OR_DEPARTMENT_OTHER): Payer: Self-pay | Admitting: *Deleted

## 2017-11-26 ENCOUNTER — Ambulatory Visit (HOSPITAL_BASED_OUTPATIENT_CLINIC_OR_DEPARTMENT_OTHER)
Admission: RE | Admit: 2017-11-26 | Discharge: 2017-11-27 | Disposition: A | Discharge status: Home / Self Care | Payer: Medicare Other | Source: Ambulatory Visit | Attending: Obstetrics and Gynecology | Admitting: Obstetrics and Gynecology

## 2017-11-26 DIAGNOSIS — N8110 Cystocele, unspecified: Secondary | ICD-10-CM | POA: Insufficient documentation

## 2017-11-26 DIAGNOSIS — R159 Full incontinence of feces: Secondary | ICD-10-CM | POA: Insufficient documentation

## 2017-11-26 DIAGNOSIS — Z800 Family history of malignant neoplasm of digestive organs: Secondary | ICD-10-CM | POA: Insufficient documentation

## 2017-11-26 DIAGNOSIS — Z881 Allergy status to other antibiotic agents status: Secondary | ICD-10-CM | POA: Diagnosis not present

## 2017-11-26 DIAGNOSIS — N816 Rectocele: Secondary | ICD-10-CM

## 2017-11-26 DIAGNOSIS — Z79899 Other long term (current) drug therapy: Secondary | ICD-10-CM | POA: Insufficient documentation

## 2017-11-26 DIAGNOSIS — Z803 Family history of malignant neoplasm of breast: Secondary | ICD-10-CM | POA: Insufficient documentation

## 2017-11-26 DIAGNOSIS — M8580 Other specified disorders of bone density and structure, unspecified site: Secondary | ICD-10-CM | POA: Insufficient documentation

## 2017-11-26 DIAGNOSIS — Z85828 Personal history of other malignant neoplasm of skin: Secondary | ICD-10-CM | POA: Insufficient documentation

## 2017-11-26 DIAGNOSIS — H409 Unspecified glaucoma: Secondary | ICD-10-CM | POA: Insufficient documentation

## 2017-11-26 DIAGNOSIS — K219 Gastro-esophageal reflux disease without esophagitis: Secondary | ICD-10-CM | POA: Insufficient documentation

## 2017-11-26 DIAGNOSIS — Z87891 Personal history of nicotine dependence: Secondary | ICD-10-CM | POA: Insufficient documentation

## 2017-11-26 DIAGNOSIS — I10 Essential (primary) hypertension: Secondary | ICD-10-CM | POA: Insufficient documentation

## 2017-11-26 HISTORY — DX: Personal history of other malignant neoplasm of skin: Z85.828

## 2017-11-26 HISTORY — DX: Diverticulosis of large intestine without perforation or abscess without bleeding: K57.30

## 2017-11-26 HISTORY — DX: Rectocele: N81.6

## 2017-11-26 HISTORY — PX: RECTOCELE REPAIR: SHX761

## 2017-11-26 HISTORY — DX: Gastro-esophageal reflux disease without esophagitis: K21.9

## 2017-11-26 HISTORY — DX: Other specified postprocedural states: Z98.890

## 2017-11-26 HISTORY — DX: Rectocele: N81.10

## 2017-11-26 HISTORY — DX: Essential tremor: G25.0

## 2017-11-26 HISTORY — DX: Other specified postprocedural states: R11.2

## 2017-11-26 LAB — TYPE AND SCREEN
ABO/RH(D): O POS
Antibody Screen: NEGATIVE

## 2017-11-26 SURGERY — COLPORRHAPHY, POSTERIOR, FOR RECTOCELE REPAIR
Anesthesia: General | Site: Vagina

## 2017-11-26 MED ORDER — LACTATED RINGERS IV SOLN
INTRAVENOUS | Status: DC
  Administered 2017-11-26 (×2): via INTRAVENOUS
  Filled 2017-11-26: qty 1000

## 2017-11-26 MED ORDER — LIDOCAINE 2% (20 MG/ML) 5 ML SYRINGE
INTRAMUSCULAR | Status: AC
  Filled 2017-11-26: qty 5

## 2017-11-26 MED ORDER — ROCURONIUM BROMIDE 10 MG/ML (PF) SYRINGE
PREFILLED_SYRINGE | INTRAVENOUS | Status: DC | PRN
  Administered 2017-11-26: 40 mg via INTRAVENOUS
  Administered 2017-11-26 (×2): 10 mg via INTRAVENOUS

## 2017-11-26 MED ORDER — PROPOFOL 10 MG/ML IV BOLUS
INTRAVENOUS | Status: DC | PRN
  Administered 2017-11-26: 100 mg via INTRAVENOUS

## 2017-11-26 MED ORDER — MIDAZOLAM HCL 2 MG/2ML IJ SOLN
INTRAMUSCULAR | Status: DC | PRN
  Administered 2017-11-26: 0.5 mg via INTRAVENOUS

## 2017-11-26 MED ORDER — ONDANSETRON HCL 4 MG/2ML IJ SOLN
INTRAMUSCULAR | Status: AC
  Filled 2017-11-26: qty 2

## 2017-11-26 MED ORDER — ACETAMINOPHEN 10 MG/ML IV SOLN
INTRAVENOUS | Status: AC
  Filled 2017-11-26: qty 100

## 2017-11-26 MED ORDER — ACETAMINOPHEN 10 MG/ML IV SOLN
1000.0000 mg | Freq: Once | INTRAVENOUS | Status: DC | PRN
  Filled 2017-11-26: qty 100

## 2017-11-26 MED ORDER — ACETAMINOPHEN 325 MG PO TABS
650.0000 mg | ORAL_TABLET | Freq: Four times a day (QID) | ORAL | Status: DC | PRN
  Filled 2017-11-26: qty 2

## 2017-11-26 MED ORDER — LIDOCAINE 2% (20 MG/ML) 5 ML SYRINGE
INTRAMUSCULAR | Status: DC | PRN
  Administered 2017-11-26: 40 mg via INTRAVENOUS

## 2017-11-26 MED ORDER — ENOXAPARIN SODIUM 40 MG/0.4ML ~~LOC~~ SOLN
40.0000 mg | SUBCUTANEOUS | Status: DC
  Filled 2017-11-26: qty 0.4

## 2017-11-26 MED ORDER — DEXAMETHASONE SODIUM PHOSPHATE 10 MG/ML IJ SOLN
INTRAMUSCULAR | Status: DC | PRN
  Administered 2017-11-26: 8 mg via INTRAVENOUS

## 2017-11-26 MED ORDER — PROMETHAZINE HCL 25 MG/ML IJ SOLN
6.2500 mg | INTRAMUSCULAR | Status: DC | PRN
  Filled 2017-11-26: qty 1

## 2017-11-26 MED ORDER — FENTANYL CITRATE (PF) 250 MCG/5ML IJ SOLN
INTRAMUSCULAR | Status: AC
  Filled 2017-11-26: qty 5

## 2017-11-26 MED ORDER — PROPRANOLOL HCL 10 MG PO TABS
10.0000 mg | ORAL_TABLET | Freq: Two times a day (BID) | ORAL | Status: DC
  Administered 2017-11-26 (×2): 10 mg via ORAL
  Filled 2017-11-26: qty 1

## 2017-11-26 MED ORDER — FENTANYL CITRATE (PF) 100 MCG/2ML IJ SOLN
INTRAMUSCULAR | Status: AC
  Filled 2017-11-26: qty 2

## 2017-11-26 MED ORDER — DEXAMETHASONE SODIUM PHOSPHATE 10 MG/ML IJ SOLN
INTRAMUSCULAR | Status: AC
  Filled 2017-11-26: qty 1

## 2017-11-26 MED ORDER — PROPOFOL 500 MG/50ML IV EMUL
INTRAVENOUS | Status: AC
  Filled 2017-11-26: qty 50

## 2017-11-26 MED ORDER — PROPOFOL 10 MG/ML IV BOLUS
INTRAVENOUS | Status: AC
  Filled 2017-11-26: qty 20

## 2017-11-26 MED ORDER — FENTANYL CITRATE (PF) 100 MCG/2ML IJ SOLN
INTRAMUSCULAR | Status: DC | PRN
  Administered 2017-11-26 (×2): 50 ug via INTRAVENOUS

## 2017-11-26 MED ORDER — ROCURONIUM BROMIDE 10 MG/ML (PF) SYRINGE
PREFILLED_SYRINGE | INTRAVENOUS | Status: AC
  Filled 2017-11-26: qty 10

## 2017-11-26 MED ORDER — ONDANSETRON HCL 4 MG/2ML IJ SOLN
4.0000 mg | Freq: Four times a day (QID) | INTRAMUSCULAR | Status: DC | PRN
  Filled 2017-11-26: qty 2

## 2017-11-26 MED ORDER — SUGAMMADEX SODIUM 200 MG/2ML IV SOLN
INTRAVENOUS | Status: DC | PRN
  Administered 2017-11-26: 150 mg via INTRAVENOUS

## 2017-11-26 MED ORDER — PHENYLEPHRINE 40 MCG/ML (10ML) SYRINGE FOR IV PUSH (FOR BLOOD PRESSURE SUPPORT)
PREFILLED_SYRINGE | INTRAVENOUS | Status: AC
  Filled 2017-11-26: qty 10

## 2017-11-26 MED ORDER — ENOXAPARIN SODIUM 40 MG/0.4ML ~~LOC~~ SOLN
40.0000 mg | SUBCUTANEOUS | Status: AC
  Administered 2017-11-26: 40 mg via SUBCUTANEOUS
  Filled 2017-11-26: qty 0.4

## 2017-11-26 MED ORDER — LOSARTAN POTASSIUM 50 MG PO TABS
100.0000 mg | ORAL_TABLET | Freq: Every morning | ORAL | Status: DC
  Administered 2017-11-26: 100 mg via ORAL
  Filled 2017-11-26: qty 2

## 2017-11-26 MED ORDER — ENOXAPARIN SODIUM 40 MG/0.4ML ~~LOC~~ SOLN
SUBCUTANEOUS | Status: AC
  Filled 2017-11-26: qty 0.4

## 2017-11-26 MED ORDER — ENOXAPARIN SODIUM 40 MG/0.4ML ~~LOC~~ SOLN
40.0000 mg | SUBCUTANEOUS | Status: DC
  Administered 2017-11-27: 40 mg via SUBCUTANEOUS
  Filled 2017-11-26: qty 0.4

## 2017-11-26 MED ORDER — PHENYLEPHRINE 40 MCG/ML (10ML) SYRINGE FOR IV PUSH (FOR BLOOD PRESSURE SUPPORT)
PREFILLED_SYRINGE | INTRAVENOUS | Status: DC | PRN
  Administered 2017-11-26: 80 ug via INTRAVENOUS

## 2017-11-26 MED ORDER — CEFAZOLIN SODIUM-DEXTROSE 2-4 GM/100ML-% IV SOLN
2.0000 g | INTRAVENOUS | Status: AC
  Administered 2017-11-26: 2 g via INTRAVENOUS
  Filled 2017-11-26: qty 100

## 2017-11-26 MED ORDER — ONDANSETRON HCL 4 MG PO TABS
4.0000 mg | ORAL_TABLET | Freq: Four times a day (QID) | ORAL | Status: DC | PRN
  Filled 2017-11-26: qty 1

## 2017-11-26 MED ORDER — OXYCODONE HCL 5 MG/5ML PO SOLN
5.0000 mg | Freq: Once | ORAL | Status: DC | PRN
  Filled 2017-11-26: qty 5

## 2017-11-26 MED ORDER — ACETAMINOPHEN 10 MG/ML IV SOLN
INTRAVENOUS | Status: DC | PRN
  Administered 2017-11-26: 1000 mg via INTRAVENOUS

## 2017-11-26 MED ORDER — FENTANYL CITRATE (PF) 100 MCG/2ML IJ SOLN
25.0000 ug | INTRAMUSCULAR | Status: DC | PRN
  Administered 2017-11-26: 25 ug via INTRAVENOUS
  Filled 2017-11-26: qty 1

## 2017-11-26 MED ORDER — LATANOPROST 0.005 % OP SOLN
1.0000 [drp] | Freq: Every day | OPHTHALMIC | Status: DC
  Administered 2017-11-26: 1 [drp] via OPHTHALMIC
  Filled 2017-11-26: qty 2.5

## 2017-11-26 MED ORDER — LORATADINE 10 MG PO TABS
10.0000 mg | ORAL_TABLET | Freq: Every day | ORAL | Status: DC | PRN
  Administered 2017-11-26: 10 mg via ORAL
  Filled 2017-11-26: qty 1

## 2017-11-26 MED ORDER — ALPRAZOLAM 0.5 MG PO TABS
0.5000 mg | ORAL_TABLET | Freq: Three times a day (TID) | ORAL | Status: DC | PRN
  Administered 2017-11-26: 0.5 mg via ORAL
  Filled 2017-11-26: qty 1

## 2017-11-26 MED ORDER — PROPOFOL 500 MG/50ML IV EMUL
INTRAVENOUS | Status: DC | PRN
  Administered 2017-11-26: 100 ug/kg/min via INTRAVENOUS

## 2017-11-26 MED ORDER — SUGAMMADEX SODIUM 200 MG/2ML IV SOLN
INTRAVENOUS | Status: AC
  Filled 2017-11-26: qty 2

## 2017-11-26 MED ORDER — MIDAZOLAM HCL 2 MG/2ML IJ SOLN
INTRAMUSCULAR | Status: AC
  Filled 2017-11-26: qty 2

## 2017-11-26 MED ORDER — HYDROCHLOROTHIAZIDE 25 MG PO TABS
25.0000 mg | ORAL_TABLET | Freq: Every morning | ORAL | Status: DC
  Administered 2017-11-26: 25 mg via ORAL
  Filled 2017-11-26: qty 1

## 2017-11-26 MED ORDER — OXYCODONE HCL 5 MG PO TABS
5.0000 mg | ORAL_TABLET | Freq: Once | ORAL | Status: DC | PRN
  Filled 2017-11-26: qty 1

## 2017-11-26 MED ORDER — OXYCODONE HCL 5 MG PO TABS
5.0000 mg | ORAL_TABLET | ORAL | Status: DC | PRN
  Filled 2017-11-26: qty 1

## 2017-11-26 MED ORDER — ONDANSETRON HCL 4 MG/2ML IJ SOLN
INTRAMUSCULAR | Status: DC | PRN
  Administered 2017-11-26: 4 mg via INTRAVENOUS

## 2017-11-26 MED ORDER — ARTIFICIAL TEARS OPHTHALMIC OINT
TOPICAL_OINTMENT | OPHTHALMIC | Status: AC
  Filled 2017-11-26: qty 3.5

## 2017-11-26 MED ORDER — CEFAZOLIN SODIUM-DEXTROSE 2-4 GM/100ML-% IV SOLN
INTRAVENOUS | Status: AC
  Filled 2017-11-26: qty 100

## 2017-11-26 MED ORDER — DEXTROSE IN LACTATED RINGERS 5 % IV SOLN
INTRAVENOUS | Status: DC
  Administered 2017-11-26 – 2017-11-27 (×2): via INTRAVENOUS
  Filled 2017-11-26 (×2): qty 1000

## 2017-11-26 SURGICAL SUPPLY — 22 items
CONT PATH 16OZ SNAP LID 3702 (MISCELLANEOUS) IMPLANT
DECANTER SPIKE VIAL GLASS SM (MISCELLANEOUS) IMPLANT
DRAPE STERI URO 9X17 APER PCH (DRAPES) IMPLANT
GAUZE PACKING IODOFORM 2 (PACKING) ×4 IMPLANT
GLOVE BIO SURGEON STRL SZ7.5 (GLOVE) ×8 IMPLANT
GLOVE BIOGEL PI IND STRL 7.0 (GLOVE) ×2 IMPLANT
GLOVE BIOGEL PI IND STRL 7.5 (GLOVE) ×4 IMPLANT
GLOVE BIOGEL PI INDICATOR 7.0 (GLOVE) ×2
GLOVE BIOGEL PI INDICATOR 7.5 (GLOVE) ×4
GOWN STRL REUS W/TWL LRG LVL3 (GOWN DISPOSABLE) ×8 IMPLANT
NS IRRIG 1000ML POUR BTL (IV SOLUTION) ×4 IMPLANT
PACK VAGINAL WOMENS (CUSTOM PROCEDURE TRAY) ×4 IMPLANT
SUT VIC AB 0 CT1 18XCR BRD8 (SUTURE) ×4 IMPLANT
SUT VIC AB 0 CT1 27 (SUTURE) ×8
SUT VIC AB 0 CT1 27XBRD ANBCTR (SUTURE) ×4 IMPLANT
SUT VIC AB 0 CT1 8-18 (SUTURE) ×8
SUT VIC AB 3-0 PS2 18 (SUTURE)
SUT VIC AB 3-0 PS2 18XBRD (SUTURE) IMPLANT
SUT VIC AB 3-0 SH 27 (SUTURE) ×4
SUT VIC AB 3-0 SH 27X BRD (SUTURE) ×2 IMPLANT
TOWEL OR 17X24 6PK STRL BLUE (TOWEL DISPOSABLE) ×8 IMPLANT
TRAY FOLEY W/BAG SLVR 14FR (SET/KITS/TRAYS/PACK) ×4 IMPLANT

## 2017-11-26 NOTE — Anesthesia Postprocedure Evaluation (Signed)
Anesthesia Post Note  Patient: Charlene Dickerson  Procedure(s) Performed: POSTERIOR REPAIR (RECTOCELE) (N/A Vagina )     Patient location during evaluation: PACU Anesthesia Type: General Level of consciousness: awake and alert Pain management: pain level controlled Vital Signs Assessment: post-procedure vital signs reviewed and stable Respiratory status: spontaneous breathing, nonlabored ventilation, respiratory function stable and patient connected to nasal cannula oxygen Cardiovascular status: blood pressure returned to baseline and stable Postop Assessment: no apparent nausea or vomiting Anesthetic complications: no    Last Vitals:  Vitals:   11/26/17 1125 11/26/17 1157  BP: 129/76 127/74  Pulse: 76 78  Resp: 16   Temp: (!) 36.4 C   SpO2: 98%     Last Pain:  Vitals:   11/26/17 1100  TempSrc:   PainSc: 2                  Dariah Mcsorley S

## 2017-11-26 NOTE — Transfer of Care (Signed)
Immediate Anesthesia Transfer of Care Note  Patient: Charlene Dickerson  Procedure(s) Performed: Procedure(s) (LRB): POSTERIOR REPAIR (RECTOCELE) (N/A)  Patient Location: PACU  Anesthesia Type: General  Level of Consciousness: awake, alert  and oriented  Airway & Oxygen Therapy: Patient Spontanous Breathing and Patient connected to face mask oxygen  Post-op Assessment: Report given to PACU RN and Post -op Vital signs reviewed and stable  Post vital signs: Reviewed and stable  Complications: No apparent anesthesia complications  Last Vitals:  Vitals Value Taken Time  BP    Temp    Pulse    Resp    SpO2      Last Pain:  Vitals:   11/26/17 1100  TempSrc:   PainSc: 2       Patients Stated Pain Goal: 9 (11/26/17 1050)

## 2017-11-26 NOTE — Op Note (Signed)
NAME: Charlene Dickerson, [place] MEDICAL RECORD [id number] ACCOUNT [id number] DATE OF BIRTH:Oct 20, 1932 FACILITY: [facility] LOCATION: [facility] PHYSICIAN:Delquan Poucher Corwin Levins, MD  OPERATIVE REPORT  DATE OF PROCEDURE:  11/26/2017  PREOPERATIVE DIAGNOSIS:  Fourth-degree rectocele that is quite symptomatic.  POSTOPERATIVE DIAGNOSIS:  Fourth-degree rectocele that is quite symptomatic.  OPERATION:  Repair of 4th degree rectocele.    SURGEON:  Tracey Harries, MD  ASSISTANT:  Dr. Mindi Slicker  ANESTHESIA:  General anesthesia.  DESCRIPTION OF PROCEDURE:  The patient was brought to the operating room and placed under satisfactory general anesthesia and placed in the lithotomy position with the Allen stirrups.  The vulva, vagina, perineum and urethra were prepped with Betadine  solution and a Foley catheter was inserted to drainage and left in situ.  The patient's posterior vaginal wall was on the perineum without any straining well outside the introitus.  There was a smaller cystocele that we had decided not to repair because  I wanted to preserve access to her cervix and also preserve her vagina as much as possible for future potential sexual activity.  The perineum was grasped with Allis clamps at the level of the introitus.  A transverse incision was made through the skin  and then the Mayo scissors were undermined the vaginal mucosa in the distal vagina, taking great care to avoid injury to the rectum.  After the introitus was navigated, the remainder of the vaginal mucosa separated very easily from the rectocele.  I went  up close to the cervix, but felt that I was above the upper most part of the rectocele.  I then developed the rectocele by sharply dissecting the membrane from the inner aspect of the vaginal mucosa.  The only area that did not dissect well was at the  lower most point of the rectocele on her left and this did not dissect at all, so I did not make any continued attempt to divide this tissue.  After  the rectocele had been developed, I did a rectal exam to be sure there had been no rectal injury.  I then  imbricated the fibromuscular tissue above the rectum with interrupted sutures of 0 Vicryl starting at the top of the rectocele and proceeding down to the perineum.  I then did another rectal exam to ensure that there were no sutures in the vagina.  The  rectocele seemed to have been obliterated.  I cut off redundant vaginal mucosa reapproximated the vaginal mucosa in the midline using multiple interrupted figure-of-eight sutures of 0 Vicryl.  The perineum was reconstructed with 3-0 Vicryl.   Again, another rectal exam was done and it appeared that the rectocele had been obliterated.  Support was good and hemostasis was adequate.  A 2-inch iodoform pack was placed into the vagina and I will leave this in overnight.  Foley catheter was left in  place and she will have this for 7 hours postop.  No complications were encountered during the surgery either from a surgical or anesthetic standpoint.  The patient was returned to recovery.  She will be evaluated there.  Blood loss was estimated at less  than 50 mL.  TN/NUANCE  D:11/26/2017 T:11/26/2017 JOB:[id number]

## 2017-11-26 NOTE — Interval H&P Note (Signed)
History and Physical Interval Note:  11/26/2017 8:30 AM  Charlene Dickerson  has presented today for surgery, with the diagnosis of rectocele, poss cystocele  The various methods of treatment have been discussed with the patient and family. After consideration of risks, benefits and other options for treatment, the patient has consented to  Procedure(s) with comments: POSTERIOR REPAIR (RECTOCELE) (N/A) - OUTPT IN BED ANTERIOR REPAIR (CYSTOCELE) (N/A) - possible as a surgical intervention .  The patient's history has been reviewed, patient examined, no change in status, stable for surgery.  I have reviewed the patient's chart and labs.  Questions were answered to the patient's satisfaction.     Bing Plume

## 2017-11-26 NOTE — Progress Notes (Signed)
VS stable. She is in no pain Pack removed minimally stained with blood. Large amount of clear yellow urine in the bag.

## 2017-11-26 NOTE — Anesthesia Preprocedure Evaluation (Addendum)
Anesthesia Evaluation  Patient identified by MRN, date of birth, ID band Patient awake    Reviewed: Allergy & Precautions, NPO status , Patient's Chart, lab work & pertinent test results  History of Anesthesia Complications (+) PONV  Airway Mallampati: II  TM Distance: >3 FB Neck ROM: Full    Dental no notable dental hx.    Pulmonary neg pulmonary ROS, former smoker,    Pulmonary exam normal breath sounds clear to auscultation       Cardiovascular hypertension, Normal cardiovascular exam Rhythm:Regular Rate:Normal     Neuro/Psych negative neurological ROS  negative psych ROS   GI/Hepatic Neg liver ROS, GERD  ,  Endo/Other  negative endocrine ROS  Renal/GU negative Renal ROS  negative genitourinary   Musculoskeletal negative musculoskeletal ROS (+)   Abdominal   Peds negative pediatric ROS (+)  Hematology negative hematology ROS (+)   Anesthesia Other Findings   Reproductive/Obstetrics negative OB ROS                             Anesthesia Physical Anesthesia Plan  ASA: II  Anesthesia Plan: General   Post-op Pain Management:    Induction: Intravenous  PONV Risk Score and Plan: 4 or greater and Ondansetron, Dexamethasone, Treatment may vary due to age or medical condition and TIVA  Airway Management Planned: Oral ETT  Additional Equipment:   Intra-op Plan:   Post-operative Plan: Extubation in OR  Informed Consent: I have reviewed the patients History and Physical, chart, labs and discussed the procedure including the risks, benefits and alternatives for the proposed anesthesia with the patient or authorized representative who has indicated his/her understanding and acceptance.   Dental advisory given  Plan Discussed with: CRNA and Surgeon  Anesthesia Plan Comments:        Anesthesia Quick Evaluation

## 2017-11-26 NOTE — Op Note (Signed)
Op note:   Pre op dx : 4th degree rectocele Post op dx: Same   Op: Repair of rectocele Op: Milbert Bixler Asst: Bang[place] General anesthesia EBL < 50 cc's.

## 2017-11-26 NOTE — Anesthesia Procedure Notes (Signed)
Procedure Name: Intubation Date/Time: 11/26/2017 8:40 AM Performed by: Myrtie Soman, MD Pre-anesthesia Checklist: Patient identified, Emergency Drugs available, Suction available and Patient being monitored Patient Re-evaluated:Patient Re-evaluated prior to induction Oxygen Delivery Method: Circle system utilized Preoxygenation: Pre-oxygenation with 100% oxygen Induction Type: IV induction Ventilation: Mask ventilation without difficulty Laryngoscope Size: Mac and 3 Grade View: Grade I Tube type: Oral Tube size: 7.0 mm Number of attempts: 1 Airway Equipment and Method: Stylet and Oral airway Placement Confirmation: ETT inserted through vocal cords under direct vision,  positive ETCO2 and breath sounds checked- equal and bilateral Secured at: 21 cm Tube secured with: Tape Dental Injury: Teeth and Oropharynx as per pre-operative assessment

## 2017-11-27 ENCOUNTER — Encounter (HOSPITAL_BASED_OUTPATIENT_CLINIC_OR_DEPARTMENT_OTHER): Payer: Self-pay | Admitting: Obstetrics and Gynecology

## 2017-11-27 DIAGNOSIS — N816 Rectocele: Secondary | ICD-10-CM | POA: Diagnosis not present

## 2017-11-27 LAB — CREATININE, SERUM
CREATININE: 0.78 mg/dL (ref 0.44–1.00)
GFR calc Af Amer: >60 mL/min (ref >60)
GFR calc non Af Amer: >60 mL/min (ref >60)

## 2017-11-27 LAB — CBC
HCT: 37.2 % (ref 36.0–46.0)
Hemoglobin: 12.6 g/dL (ref 12.0–15.0)
MCH: 33.2 pg (ref 26.0–34.0)
MCHC: 33.9 g/dL (ref 30.0–36.0)
MCV: 98.2 fL (ref 78.0–100.0)
PLATELETS: 185 10*3/uL (ref 150–400)
RBC: 3.79 MIL/uL — AB (ref 3.87–5.11)
RDW: 12.8 % (ref 11.5–15.5)
WBC: 10.1 10*3/uL (ref 4.0–10.5)

## 2017-11-27 MED ORDER — ENOXAPARIN SODIUM 40 MG/0.4ML ~~LOC~~ SOLN
SUBCUTANEOUS | Status: AC
  Filled 2017-11-27: qty 0.4

## 2017-11-27 NOTE — Discharge Summary (Signed)
NAME: Charlene Dickerson, Charlene Dickerson MEDICAL RECORD [id number] ACCOUNT [id number] DATE OF BIRTH:1932-09-28 FACILITY: [facility] LOCATION: [facility] PHYSICIAN:Leasa Kincannon Corwin Levins, MD  DISCHARGE SUMMARY  DATE OF DISCHARGE:  11/27/2017  NO DICTATION.Redictated 12-10-17  AN/NUANCE D:11/27/2017 T:11/27/2017 JOB:[id number]

## 2017-11-27 NOTE — Discharge Instructions (Signed)
No vaginal entrance. Call with temp > 100.4 degrees,avoid constipation. Call with severe pain or any other problems+

## 2017-11-27 NOTE — Progress Notes (Signed)
Patient ID: Charlene Dickerson, female   DOB: Apr 18, 1932, 84 y.o.   MRN: [id number] #1 afebrile no problems. Tolerating a regular diet, ambulating well, no vaginal bleeding. Pack was removed yesterday. For discharge

## 2017-12-10 NOTE — Discharge Summary (Signed)
NAME: Charlene Dickerson, [place] MEDICAL RECORD [id number] ACCOUNT [id number] DATE OF BIRTH:11/21/1932 FACILITY: [facility] LOCATION: [facility] PHYSICIAN:Kamali Nephew Corwin Levins, MD  DISCHARGE SUMMARY  DATE OF DISCHARGE:  11/27/2017  This is an 84 year old white female who was admitted to the hospital for repair of rectocele.  The patient underwent a posterior colporrhaphy by Dr. Ambrose Mantle with Dr. Mindi Slicker assisting under general anesthesia without complications.  She was kept overnight.   Her Foley catheter was removed at 5 p.m. on the day of surgery.  The vaginal packing was removed shortly thereafter.  She did well overnight and was discharged on the first postop day without complications.  FINAL DIAGNOSIS:  Fourth degree rectocele, very symptomatic.  OPERATION:  Posterior repair.    FINAL CONDITION:  Improved.    INSTRUCTIONS:  No vaginal instruments.  No heavy lifting or strenuous activity.  Call with any fever above 100.4 degrees.  Call with any unusual problems.  Return to the office in 2 weeks for followup examination.  Resume her regular medications at home.   Tylenol for pain.  TN/NUANCE D:12/10/2017 T:12/10/2017 JOB:[id number]

## 2018-02-18 ENCOUNTER — Emergency Department (HOSPITAL_COMMUNITY): Payer: Medicare Other

## 2018-02-18 ENCOUNTER — Inpatient Hospital Stay (HOSPITAL_COMMUNITY)
Admission: EM | Admit: 2018-02-18 | Discharge: 2018-02-24 | DRG: 246 | Disposition: A | Discharge status: Home Health | Payer: Medicare Other | Attending: Cardiology | Admitting: Cardiology

## 2018-02-18 ENCOUNTER — Encounter (HOSPITAL_COMMUNITY): Payer: Self-pay | Admitting: Student

## 2018-02-18 ENCOUNTER — Inpatient Hospital Stay (HOSPITAL_COMMUNITY)
Admission: EM | Disposition: A | Discharge status: Home Health | Payer: Self-pay | Source: Home / Self Care | Attending: Cardiology

## 2018-02-18 DIAGNOSIS — I213 ST elevation (STEMI) myocardial infarction of unspecified site: Secondary | ICD-10-CM

## 2018-02-18 DIAGNOSIS — I2119 ST elevation (STEMI) myocardial infarction involving other coronary artery of inferior wall: Principal | ICD-10-CM | POA: Diagnosis present

## 2018-02-18 DIAGNOSIS — R579 Shock, unspecified: Secondary | ICD-10-CM | POA: Diagnosis not present

## 2018-02-18 DIAGNOSIS — I442 Atrioventricular block, complete: Secondary | ICD-10-CM

## 2018-02-18 DIAGNOSIS — Z85828 Personal history of other malignant neoplasm of skin: Secondary | ICD-10-CM

## 2018-02-18 DIAGNOSIS — G250 Essential tremor: Secondary | ICD-10-CM | POA: Diagnosis present

## 2018-02-18 DIAGNOSIS — I2510 Atherosclerotic heart disease of native coronary artery without angina pectoris: Secondary | ICD-10-CM

## 2018-02-18 DIAGNOSIS — I10 Essential (primary) hypertension: Secondary | ICD-10-CM | POA: Diagnosis present

## 2018-02-18 DIAGNOSIS — R570 Cardiogenic shock: Secondary | ICD-10-CM | POA: Diagnosis present

## 2018-02-18 DIAGNOSIS — E876 Hypokalemia: Secondary | ICD-10-CM | POA: Diagnosis not present

## 2018-02-18 DIAGNOSIS — E872 Acidosis: Secondary | ICD-10-CM | POA: Diagnosis present

## 2018-02-18 DIAGNOSIS — I351 Nonrheumatic aortic (valve) insufficiency: Secondary | ICD-10-CM | POA: Diagnosis not present

## 2018-02-18 DIAGNOSIS — Z8249 Family history of ischemic heart disease and other diseases of the circulatory system: Secondary | ICD-10-CM | POA: Diagnosis not present

## 2018-02-18 DIAGNOSIS — J9601 Acute respiratory failure with hypoxia: Secondary | ICD-10-CM | POA: Diagnosis present

## 2018-02-18 DIAGNOSIS — I219 Acute myocardial infarction, unspecified: Secondary | ICD-10-CM | POA: Diagnosis present

## 2018-02-18 DIAGNOSIS — Z803 Family history of malignant neoplasm of breast: Secondary | ICD-10-CM | POA: Diagnosis not present

## 2018-02-18 DIAGNOSIS — T80212A Local infection due to central venous catheter, initial encounter: Secondary | ICD-10-CM

## 2018-02-18 DIAGNOSIS — I2129 ST elevation (STEMI) myocardial infarction involving other sites: Secondary | ICD-10-CM | POA: Diagnosis not present

## 2018-02-18 DIAGNOSIS — I4891 Unspecified atrial fibrillation: Secondary | ICD-10-CM | POA: Diagnosis present

## 2018-02-18 DIAGNOSIS — R0989 Other specified symptoms and signs involving the circulatory and respiratory systems: Secondary | ICD-10-CM | POA: Diagnosis not present

## 2018-02-18 DIAGNOSIS — Z955 Presence of coronary angioplasty implant and graft: Secondary | ICD-10-CM

## 2018-02-18 DIAGNOSIS — I253 Aneurysm of heart: Secondary | ICD-10-CM | POA: Diagnosis present

## 2018-02-18 DIAGNOSIS — I4819 Other persistent atrial fibrillation: Secondary | ICD-10-CM | POA: Diagnosis not present

## 2018-02-18 DIAGNOSIS — Z789 Other specified health status: Secondary | ICD-10-CM | POA: Diagnosis not present

## 2018-02-18 DIAGNOSIS — N179 Acute kidney failure, unspecified: Secondary | ICD-10-CM | POA: Diagnosis present

## 2018-02-18 DIAGNOSIS — K7200 Acute and subacute hepatic failure without coma: Secondary | ICD-10-CM | POA: Diagnosis present

## 2018-02-18 DIAGNOSIS — I361 Nonrheumatic tricuspid (valve) insufficiency: Secondary | ICD-10-CM | POA: Diagnosis not present

## 2018-02-18 DIAGNOSIS — Z452 Encounter for adjustment and management of vascular access device: Secondary | ICD-10-CM

## 2018-02-18 DIAGNOSIS — I2111 ST elevation (STEMI) myocardial infarction involving right coronary artery: Secondary | ICD-10-CM | POA: Diagnosis not present

## 2018-02-18 HISTORY — DX: ST elevation (STEMI) myocardial infarction of unspecified site: I21.3

## 2018-02-18 HISTORY — PX: CORONARY STENT INTERVENTION: CATH118234

## 2018-02-18 HISTORY — PX: LEFT HEART CATH AND CORONARY ANGIOGRAPHY: CATH118249

## 2018-02-18 HISTORY — PX: TEMPORARY PACEMAKER: CATH118268

## 2018-02-18 LAB — CBC
HCT: 32.9 % — ABNORMAL LOW (ref 36.0–46.0)
Hemoglobin: 11.1 g/dL — ABNORMAL LOW (ref 12.0–15.0)
MCH: 33.8 pg (ref 26.0–34.0)
MCHC: 33.7 g/dL (ref 30.0–36.0)
MCV: 100.3 fL — ABNORMAL HIGH (ref 80.0–100.0)
Platelets: 99 10*3/uL — ABNORMAL LOW (ref 150–400)
RBC: 3.28 MIL/uL — ABNORMAL LOW (ref 3.87–5.11)
RDW: 13.1 % (ref 11.5–15.5)
WBC: 10.6 10*3/uL — ABNORMAL HIGH (ref 4.0–10.5)
nRBC: 0 % (ref 0.0–0.2)

## 2018-02-18 LAB — COMPREHENSIVE METABOLIC PANEL
ALT: 972 U/L — ABNORMAL HIGH (ref 0–44)
AST: 970 U/L — ABNORMAL HIGH (ref 15–41)
Albumin: 3.7 g/dL (ref 3.5–5.0)
Alkaline Phosphatase: 26 U/L — ABNORMAL LOW (ref 38–126)
Anion gap: 20 — ABNORMAL HIGH (ref 5–15)
BUN: 66 mg/dL — ABNORMAL HIGH (ref 8–23)
CO2: 16 mmol/L — ABNORMAL LOW (ref 22–32)
Calcium: 9.9 mg/dL (ref 8.9–10.3)
Chloride: 96 mmol/L — ABNORMAL LOW (ref 98–111)
Creatinine, Ser: 3.8 mg/dL — ABNORMAL HIGH (ref 0.44–1.00)
GFR calc Af Amer: 12 mL/min — ABNORMAL LOW (ref >60)
GFR, EST NON AFRICAN AMERICAN: 10 mL/min — AB (ref >60)
Glucose, Bld: 103 mg/dL — ABNORMAL HIGH (ref 70–99)
Potassium: 5.1 mmol/L (ref 3.5–5.1)
Sodium: 132 mmol/L — ABNORMAL LOW (ref 135–145)
Total Bilirubin: 2.5 mg/dL — ABNORMAL HIGH (ref 0.3–1.2)
Total Protein: 6.2 g/dL — ABNORMAL LOW (ref 6.5–8.1)

## 2018-02-18 LAB — I-STAT CHEM 8, ED
BUN: 64 mg/dL — AB (ref 8–23)
CREATININE: 4 mg/dL — AB (ref 0.44–1.00)
Calcium, Ion: 1.22 mmol/L (ref 1.15–1.40)
Chloride: 100 mmol/L (ref 98–111)
Glucose, Bld: 100 mg/dL — ABNORMAL HIGH (ref 70–99)
HCT: 37 % (ref 36.0–46.0)
Hemoglobin: 12.6 g/dL (ref 12.0–15.0)
Potassium: 5 mmol/L (ref 3.5–5.1)
Sodium: 130 mmol/L — ABNORMAL LOW (ref 135–145)
TCO2: 19 mmol/L — ABNORMAL LOW (ref 22–32)

## 2018-02-18 LAB — I-STAT TROPONIN, ED: Troponin i, poc: 23.56 ng/mL (ref 0.00–0.08)

## 2018-02-18 LAB — CBC WITH DIFFERENTIAL/PLATELET
Abs Immature Granulocytes: 0.09 10*3/uL — ABNORMAL HIGH (ref 0.00–0.07)
Basophils Absolute: 0 10*3/uL (ref 0.0–0.1)
Basophils Relative: 0 %
Eosinophils Absolute: 0 10*3/uL (ref 0.0–0.5)
Eosinophils Relative: 0 %
HCT: 38.9 % (ref 36.0–46.0)
Hemoglobin: 12.4 g/dL (ref 12.0–15.0)
IMMATURE GRANULOCYTES: 1 %
Lymphocytes Relative: 9 %
Lymphs Abs: 1.1 10*3/uL (ref 0.7–4.0)
MCH: 32.5 pg (ref 26.0–34.0)
MCHC: 31.9 g/dL (ref 30.0–36.0)
MCV: 102.1 fL — ABNORMAL HIGH (ref 80.0–100.0)
Monocytes Absolute: 1.5 10*3/uL — ABNORMAL HIGH (ref 0.1–1.0)
Monocytes Relative: 12 %
NEUTROS PCT: 78 %
NRBC: 0 % (ref 0.0–0.2)
Neutro Abs: 9.8 10*3/uL — ABNORMAL HIGH (ref 1.7–7.7)
Platelets: 102 10*3/uL — ABNORMAL LOW (ref 150–400)
RBC: 3.81 MIL/uL — ABNORMAL LOW (ref 3.87–5.11)
RDW: 13 % (ref 11.5–15.5)
WBC: 12.6 10*3/uL — ABNORMAL HIGH (ref 4.0–10.5)

## 2018-02-18 LAB — I-STAT CG4 LACTIC ACID, ED: Lactic Acid, Venous: 5.37 mmol/L (ref 0.5–1.9)

## 2018-02-18 LAB — PROTIME-INR
INR: 1.63
PROTHROMBIN TIME: 19.1 s — AB (ref 11.4–15.2)

## 2018-02-18 LAB — LIPID PANEL
Cholesterol: 168 mg/dL (ref 0–200)
HDL: 62 mg/dL (ref >40)
LDL Cholesterol: 92 mg/dL (ref 0–99)
Total CHOL/HDL Ratio: 2.7 RATIO
Triglycerides: 70 mg/dL (ref <150)
VLDL: 14 mg/dL (ref 0–40)

## 2018-02-18 LAB — POCT I-STAT 3, ART BLOOD GAS (G3+)
Acid-base deficit: 6 mmol/L — ABNORMAL HIGH (ref 0.0–2.0)
BICARBONATE: 19.3 mmol/L — AB (ref 20.0–28.0)
O2 Saturation: 100 %
Patient temperature: 97.2
TCO2: 20 mmol/L — AB (ref 22–32)
pCO2 arterial: 34.1 mmHg (ref 32.0–48.0)
pH, Arterial: 7.356 (ref 7.350–7.450)
pO2, Arterial: 391 mmHg — ABNORMAL HIGH (ref 83.0–108.0)

## 2018-02-18 LAB — POCT ACTIVATED CLOTTING TIME
Activated Clotting Time: 439 seconds
Activated Clotting Time: 213 seconds

## 2018-02-18 LAB — GLUCOSE, CAPILLARY: Glucose-Capillary: 97 mg/dL (ref 70–99)

## 2018-02-18 LAB — TROPONIN I
Troponin I: 39.64 ng/mL (ref <0.03)
Troponin I: >65 ng/mL (ref <0.03)
Troponin I: >65 ng/mL (ref <0.03)

## 2018-02-18 LAB — APTT: aPTT: 30 seconds (ref 24–36)

## 2018-02-18 LAB — MRSA PCR SCREENING: MRSA by PCR: NEGATIVE

## 2018-02-18 SURGERY — LEFT HEART CATH AND CORONARY ANGIOGRAPHY
Anesthesia: LOCAL

## 2018-02-18 MED ORDER — ATROPINE SULFATE 1 MG/10ML IJ SOSY
1.0000 mg | PREFILLED_SYRINGE | Freq: Once | INTRAMUSCULAR | Status: AC
  Administered 2018-02-18: 1 mg via INTRAVENOUS

## 2018-02-18 MED ORDER — ONDANSETRON HCL 4 MG/2ML IJ SOLN
4.0000 mg | Freq: Four times a day (QID) | INTRAMUSCULAR | Status: DC | PRN
  Administered 2018-02-21: 4 mg via INTRAVENOUS
  Filled 2018-02-18: qty 2

## 2018-02-18 MED ORDER — TIROFIBAN (AGGRASTAT) BOLUS VIA INFUSION
25.0000 ug/kg | Freq: Once | INTRAVENOUS | Status: DC
  Filled 2018-02-18: qty 34

## 2018-02-18 MED ORDER — LABETALOL HCL 5 MG/ML IV SOLN: 10.0000 mg | INTRAVENOUS | Status: AC | PRN

## 2018-02-18 MED ORDER — SODIUM CHLORIDE 0.9 % IV SOLN
INTRAVENOUS | Status: DC
  Administered 2018-02-18 – 2018-02-19 (×2): via INTRAVENOUS

## 2018-02-18 MED ORDER — SODIUM CHLORIDE 0.9% FLUSH: 3.0000 mL | INTRAVENOUS | Status: DC | PRN

## 2018-02-18 MED ORDER — BIVALIRUDIN TRIFLUOROACETATE 250 MG IV SOLR
INTRAVENOUS | Status: AC
  Filled 2018-02-18: qty 250

## 2018-02-18 MED ORDER — ASPIRIN 81 MG PO CHEW
324.0000 mg | CHEWABLE_TABLET | Freq: Once | ORAL | Status: DC
  Filled 2018-02-18: qty 4

## 2018-02-18 MED ORDER — MIDAZOLAM BOLUS VIA INFUSION
INTRAVENOUS | Status: DC | PRN
  Administered 2018-02-18 (×2): 3 mg via INTRAVENOUS

## 2018-02-18 MED ORDER — HEPARIN SODIUM (PORCINE) 1000 UNIT/ML IJ SOLN
4000.0000 [IU] | Freq: Once | INTRAMUSCULAR | Status: AC
  Administered 2018-02-18: 4000 [IU] via INTRAVENOUS

## 2018-02-18 MED ORDER — HEPARIN SODIUM (PORCINE) 5000 UNIT/ML IJ SOLN: 60.0000 [IU]/kg | Freq: Once | INTRAMUSCULAR | Status: DC

## 2018-02-18 MED ORDER — FENTANYL 2500MCG IN NS 250ML (10MCG/ML) PREMIX INFUSION
0.0000 ug/h | INTRAVENOUS | Status: DC
  Filled 2018-02-18: qty 250

## 2018-02-18 MED ORDER — SODIUM CHLORIDE 0.9 % IV SOLN
INTRAVENOUS | Status: AC
  Administered 2018-02-18: 18:00:00 via INTRAVENOUS

## 2018-02-18 MED ORDER — SODIUM CHLORIDE 0.9 % IV SOLN
INTRAVENOUS | Status: DC | PRN
  Administered 2018-02-18: 1 mg/kg/h via INTRAVENOUS

## 2018-02-18 MED ORDER — HEPARIN (PORCINE) IN NACL 1000-0.9 UT/500ML-% IV SOLN
INTRAVENOUS | Status: AC
  Filled 2018-02-18: qty 500

## 2018-02-18 MED ORDER — BIVALIRUDIN BOLUS VIA INFUSION - CUPID
INTRAVENOUS | Status: DC | PRN
  Administered 2018-02-18: 51 mg via INTRAVENOUS

## 2018-02-18 MED ORDER — ASPIRIN 300 MG RE SUPP
300.0000 mg | Freq: Once | RECTAL | Status: AC
  Administered 2018-02-18: 300 mg via RECTAL

## 2018-02-18 MED ORDER — NOREPINEPHRINE BITARTRATE 1 MG/ML IV SOLN
0.0000 ug/min | INTRAVENOUS | Status: DC
  Administered 2018-02-18 – 2018-02-19 (×2): 10 ug/min via INTRAVENOUS
  Administered 2018-02-19: 14 ug/min via INTRAVENOUS
  Administered 2018-02-19: 17 ug/min via INTRAVENOUS
  Administered 2018-02-19 (×2): 16 ug/min via INTRAVENOUS
  Administered 2018-02-20: 11 ug/min via INTRAVENOUS
  Administered 2018-02-20: 16 ug/min via INTRAVENOUS
  Administered 2018-02-20: 20 ug/min via INTRAVENOUS
  Administered 2018-02-20 (×2): 18 ug/min via INTRAVENOUS
  Administered 2018-02-20: 22 ug/min via INTRAVENOUS
  Administered 2018-02-21 (×2): 16 ug/min via INTRAVENOUS
  Filled 2018-02-18 (×13): qty 4

## 2018-02-18 MED ORDER — TICAGRELOR 90 MG PO TABS
90.0000 mg | ORAL_TABLET | Freq: Two times a day (BID) | ORAL | Status: DC
  Administered 2018-02-18 – 2018-02-23 (×10): 90 mg via ORAL
  Filled 2018-02-18 (×11): qty 1

## 2018-02-18 MED ORDER — FENTANYL CITRATE (PF) 100 MCG/2ML IJ SOLN
50.0000 ug | Freq: Once | INTRAMUSCULAR | Status: AC
  Administered 2018-02-18: 50 ug via INTRAVENOUS

## 2018-02-18 MED ORDER — HYDRALAZINE HCL 20 MG/ML IJ SOLN: 5.0000 mg | INTRAMUSCULAR | Status: AC | PRN

## 2018-02-18 MED ORDER — ETOMIDATE 2 MG/ML IV SOLN
15.0000 mg | Freq: Once | INTRAVENOUS | Status: AC
  Administered 2018-02-18: 15 mg via INTRAVENOUS

## 2018-02-18 MED ORDER — ATROPINE SULFATE 1 MG/10ML IJ SOSY
PREFILLED_SYRINGE | INTRAMUSCULAR | Status: AC
  Filled 2018-02-18: qty 10

## 2018-02-18 MED ORDER — "THROMBI-PAD 3""X3"" EX PADS"
1.0000 | MEDICATED_PAD | Freq: Once | CUTANEOUS | Status: AC
  Administered 2018-02-18: 1 via TOPICAL
  Filled 2018-02-18: qty 1

## 2018-02-18 MED ORDER — ROCURONIUM BROMIDE 50 MG/5ML IV SOLN
1.0000 mg/kg | Freq: Once | INTRAVENOUS | Status: AC
  Administered 2018-02-18: 50 mg via INTRAVENOUS

## 2018-02-18 MED ORDER — ASPIRIN 81 MG PO CHEW
81.0000 mg | CHEWABLE_TABLET | Freq: Every day | ORAL | Status: DC
  Administered 2018-02-19 – 2018-02-24 (×6): 81 mg via ORAL
  Filled 2018-02-18 (×7): qty 1

## 2018-02-18 MED ORDER — MIDAZOLAM BOLUS VIA INFUSION
2.0000 mg | Freq: Once | INTRAVENOUS | Status: AC
  Administered 2018-02-18: 2 mg via INTRAVENOUS
  Filled 2018-02-18: qty 2

## 2018-02-18 MED ORDER — LIDOCAINE HCL (PF) 1 % IJ SOLN
INTRAMUSCULAR | Status: DC | PRN
  Administered 2018-02-18: 15 mL

## 2018-02-18 MED ORDER — TICAGRELOR 90 MG PO TABS
ORAL_TABLET | ORAL | Status: AC
  Filled 2018-02-18: qty 1

## 2018-02-18 MED ORDER — MIDAZOLAM HCL 2 MG/2ML IJ SOLN: 1.0000 mg | INTRAMUSCULAR | Status: DC | PRN

## 2018-02-18 MED ORDER — FENTANYL BOLUS VIA INFUSION
25.0000 ug | INTRAVENOUS | Status: DC | PRN
  Filled 2018-02-18: qty 25

## 2018-02-18 MED ORDER — TICAGRELOR 90 MG PO TABS
ORAL_TABLET | ORAL | Status: DC | PRN
  Administered 2018-02-18: 180 mg via ORAL

## 2018-02-18 MED ORDER — TIROFIBAN HCL IN NACL 5-0.9 MG/100ML-% IV SOLN
INTRAVENOUS | Status: AC
  Filled 2018-02-18: qty 100

## 2018-02-18 MED ORDER — SODIUM CHLORIDE 0.9 % IV SOLN: 250.0000 mL | INTRAVENOUS | Status: DC | PRN

## 2018-02-18 MED ORDER — TIROFIBAN (AGGRASTAT) BOLUS VIA INFUSION
INTRAVENOUS | Status: DC | PRN
  Administered 2018-02-18: 1700 ug via INTRAVENOUS

## 2018-02-18 MED ORDER — TIROFIBAN HCL IV 12.5 MG/250 ML
0.0750 ug/kg/min | INTRAVENOUS | Status: DC
  Administered 2018-02-18: 0.075 ug/kg/min via INTRAVENOUS
  Filled 2018-02-18: qty 250

## 2018-02-18 MED ORDER — SODIUM CHLORIDE 0.9 % IV SOLN
INTRAVENOUS | Status: AC | PRN
  Administered 2018-02-18: 10 mL/h via INTRAVENOUS

## 2018-02-18 MED ORDER — LIDOCAINE HCL (PF) 1 % IJ SOLN
INTRAMUSCULAR | Status: AC
  Filled 2018-02-18: qty 30

## 2018-02-18 MED ORDER — TIROFIBAN HCL IN NACL 5-0.9 MG/100ML-% IV SOLN
INTRAVENOUS | Status: AC | PRN
  Administered 2018-02-18: 0.075 ug/kg/min via INTRAVENOUS

## 2018-02-18 MED ORDER — IOHEXOL 350 MG/ML SOLN
INTRAVENOUS | Status: DC | PRN
  Administered 2018-02-18: 190 mL via INTRA_ARTERIAL

## 2018-02-18 MED ORDER — SODIUM CHLORIDE 0.9 % IV SOLN
0.5000 mg/h | INTRAVENOUS | Status: DC
  Filled 2018-02-18: qty 10

## 2018-02-18 MED ORDER — SODIUM CHLORIDE 0.9 % IV SOLN
0.0000 mg/h | INTRAVENOUS | Status: DC
  Administered 2018-02-18: 0.5 mg/h via INTRAVENOUS
  Filled 2018-02-18: qty 10

## 2018-02-18 MED ORDER — PANTOPRAZOLE SODIUM 40 MG IV SOLR
40.0000 mg | INTRAVENOUS | Status: DC
  Administered 2018-02-18: 40 mg via INTRAVENOUS
  Filled 2018-02-18: qty 40

## 2018-02-18 MED ORDER — SODIUM CHLORIDE 0.9% FLUSH
3.0000 mL | Freq: Two times a day (BID) | INTRAVENOUS | Status: DC
  Administered 2018-02-19 – 2018-02-24 (×10): 3 mL via INTRAVENOUS

## 2018-02-18 MED ORDER — FENTANYL 2500MCG IN NS 250ML (10MCG/ML) PREMIX INFUSION
25.0000 ug/h | INTRAVENOUS | Status: DC
  Administered 2018-02-18: 25 ug/h via INTRAVENOUS

## 2018-02-18 MED ORDER — ACETAMINOPHEN 325 MG PO TABS
650.0000 mg | ORAL_TABLET | ORAL | Status: DC | PRN
  Filled 2018-02-18: qty 2

## 2018-02-18 MED ORDER — MIDAZOLAM BOLUS VIA INFUSION
1.0000 mg | INTRAVENOUS | Status: DC | PRN
  Filled 2018-02-18: qty 2

## 2018-02-18 MED FILL — Medication: Qty: 1 | Status: AC

## 2018-02-18 SURGICAL SUPPLY — 31 items
BALLN EMERGE MR 2.0X8 (BALLOONS) ×3
BALLN EMERGE MR 2.5X8 (BALLOONS) ×3
BALLN SAPPHIRE 1.5X12 (BALLOONS) ×3
BALLN SAPPHIRE 2.0X12 (BALLOONS) ×6
BALLN SAPPHIRE 2.25X20 (BALLOONS) ×3
BALLN ~~LOC~~ EMERGE MR 2.75X12 (BALLOONS) ×3
BALLN ~~LOC~~ EMERGE MR 2.75X6 (BALLOONS) ×3
BALLOON EMERGE MR 2.0X8 (BALLOONS) ×2 IMPLANT
BALLOON EMERGE MR 2.5X8 (BALLOONS) ×2 IMPLANT
BALLOON SAPPHIRE 1.5X12 (BALLOONS) ×2 IMPLANT
BALLOON SAPPHIRE 2.0X12 (BALLOONS) ×4 IMPLANT
BALLOON SAPPHIRE 2.25X20 (BALLOONS) ×2 IMPLANT
BALLOON ~~LOC~~ EMERGE MR 2.75X12 (BALLOONS) ×2 IMPLANT
BALLOON ~~LOC~~ EMERGE MR 2.75X6 (BALLOONS) ×2 IMPLANT
CABLE ADAPT CONN TEMP 6FT (ADAPTER) ×3 IMPLANT
CATH EXTRAC PRONTO LP 6F RND (CATHETERS) ×3 IMPLANT
CATH INFINITI 5FR MULTPACK ANG (CATHETERS) ×3 IMPLANT
CATH LAUNCHER 6FR JR4 (CATHETERS) ×3 IMPLANT
CATH S G BIP PACING (CATHETERS) ×3 IMPLANT
GUIDELINER 6F (CATHETERS) ×3 IMPLANT
KIT ENCORE 26 ADVANTAGE (KITS) ×3 IMPLANT
KIT HEART LEFT (KITS) ×3 IMPLANT
PACK CARDIAC CATHETERIZATION (CUSTOM PROCEDURE TRAY) ×3 IMPLANT
SHEATH PINNACLE 6F 10CM (SHEATH) ×6 IMPLANT
STENT SYNERGY DES 2.50X8 (Permanent Stent) ×3 IMPLANT
STENT SYNERGY DES 2.75X16 (Permanent Stent) ×3 IMPLANT
STENT SYNERGY DES 3X12 (Permanent Stent) ×3 IMPLANT
TRANSDUCER W/STOPCOCK (MISCELLANEOUS) ×3 IMPLANT
TUBING CIL FLEX 10 FLL-RA (TUBING) ×3 IMPLANT
WIRE ASAHI PROWATER 180CM (WIRE) ×3 IMPLANT
WIRE EMERALD 3MM-J .035X150CM (WIRE) ×3 IMPLANT

## 2018-02-18 NOTE — ED Provider Notes (Addendum)
[facility] Provider Note   CSN: [id number] Arrival date & time: 02/18/18  1354     History   Chief Complaint No chief complaint on file.   HPI Charlene Dickerson is a 84 y.o. female.  HPI   84 yo F with PMHx HTN, GERD here w/ hypotension and confusion. History limited 2/2 marked confusion on arrival. Pt reportedly was found confused today. Last normal was yesterday. Pt found confused by neighbor who called 911.  Per EMS, pt bradycardic to 20s en route, encephalopathic. On arrival, pt arousable but confused. Unable to provide history. Daughter is en route.  Past Medical History:  Diagnosis Date  . Diverticulosis of colon   . Essential tremor   . GERD (gastroesophageal reflux disease)    occasional (no meds)  . History of basal cell carcinoma excision 2012  . Hypertension   . Osteoporosis   . PONV (postoperative nausea and vomiting)   . Rectocele    symptomatic    Patient Active Problem List   Diagnosis Date Noted  . STEMI (ST elevation myocardial infarction) (HCC) 02/18/2018  . Rectocele 11/26/2017  . Cystocele with rectocele 11/26/2017  . Diffuse cystic mastopathy of breast 05/09/2011    Past Surgical History:  Procedure Laterality Date  . BREAST EXCISIONAL BIOPSY Left 1995  . BREAST EXCISIONAL BIOPSY Right 1988  . CATARACT EXTRACTION W/ INTRAOCULAR LENS  IMPLANT, BILATERAL  right 09/ 2013;  left 10/ 2013  . COLONOSCOPY  last one 04/10/ 2012  . D & C HYSTEROSCOPY W/ RESECTION ENDOMETRIAL POLYP AND LESION  07-27-2007   dr Ambrose Mantlehenley @[facility]   . RECTOCELE REPAIR N/A 11/26/2017   Procedure: POSTERIOR REPAIR (RECTOCELE);  Surgeon: Tracey HarriesHenley, Thomas, MD;  [facility];  Service: Gynecology;  Laterality: N/A;  OUTPT IN BED  . TUBAL LIGATION Bilateral yrs ago     OB History   No obstetric history on file.      Home Medications    Prior to Admission medications   Medication Sig Start Date End Date Taking?  Authorizing Provider  ALPRAZolam Prudy Feeler(XANAX) 0.5 MG tablet Take 0.5 mg by mouth 3 (three) times daily as needed for anxiety.    [provider]  latanoprost (XALATAN) 0.005 % ophthalmic solution Place 1 drop into both eyes at bedtime.  04/17/11   [provider]  losartan (COZAAR) 100 MG tablet Take 100 mg by mouth every morning.    [provider]  propranolol (INDERAL) 10 MG tablet Take 10 mg by mouth 2 (two) times daily. (2) 1 times daily     [provider]    Family History Family History  Problem Relation Age of Onset  . Cancer Sister        Breast  . Heart disease Sister   . Breast cancer Sister   . Heart disease Father   . Cancer Daughter        breast  . Breast cancer Daughter   . Cancer Maternal Aunt        breast  . Breast cancer Maternal Aunt   . Cancer Maternal Grandmother        breast  . Breast cancer Maternal Grandmother     Social History Social History   Tobacco Use  . Smoking status: Former Smoker    Last attempt to quit: 05/08/1961    Years since quitting: 56.8  . Smokeless tobacco: Never Used  Substance Use Topics  . Alcohol use: No  .  Drug use: Not on file     Allergies   Clindamycin/lincomycin   Review of Systems Review of Systems  Unable to perform ROS: Mental status change     Physical Exam Updated Vital Signs BP (!) 85/69   Pulse (!) 36   Resp 19   Ht 5\' 3"  (1.6 m)   Wt 68 kg   SpO2 92%   BMI 26.56 kg/m   Physical Exam Vitals signs reviewed.  Constitutional:      Appearance: She is ill-appearing and diaphoretic.     Comments: Drowsy, confused.  HENT:     Head: Normocephalic and atraumatic.     Mouth/Throat:     Mouth: Mucous membranes are moist.  Eyes:     Pupils: Pupils are equal, round, and reactive to light.  Neck:     Musculoskeletal: Neck supple.  Cardiovascular:     Rate and Rhythm: Bradycardia present. Rhythm irregular.  Pulmonary:     Effort: Pulmonary effort is normal.    Musculoskeletal:        General: No swelling.  Skin:    Capillary Refill: Capillary refill takes 2 to 3 seconds.     Coloration: Skin is pale.  Neurological:     Comments: Drowsy, slurred speech, intermittently awake and responsive then falls asleep. Noted to move all extremities spontaneously. When awake, will answer questions inappropriately. No facial asymmetry. Tongue is midline. Withdraws to painful stimuli b/l UE and LE.      ED Treatments / Results  Labs (all labs ordered are listed, but only abnormal results are displayed) Labs Reviewed  CBC WITH DIFFERENTIAL/PLATELET - Abnormal; Notable for the following components:      Result Value   WBC 12.6 (*)    RBC 3.81 (*)    MCV 102.1 (*)    Platelets 102 (*)    Neutro Abs 9.8 (*)    Monocytes Absolute 1.5 (*)    Abs Immature Granulocytes 0.09 (*)    All other components within normal limits  PROTIME-INR - Abnormal; Notable for the following components:   Prothrombin Time 19.1 (*)    All other components within normal limits  COMPREHENSIVE METABOLIC PANEL - Abnormal; Notable for the following components:   Sodium 132 (*)    Chloride 96 (*)    CO2 16 (*)    Glucose, Bld 103 (*)    BUN 66 (*)    Creatinine, Ser 3.80 (*)    Total Protein 6.2 (*)    AST 970 (*)    ALT 972 (*)    Alkaline Phosphatase 26 (*)    Total Bilirubin 2.5 (*)    GFR calc non Af Amer 10 (*)    GFR calc Af Amer 12 (*)    Anion gap 20 (*)    All other components within normal limits  TROPONIN I - Abnormal; Notable for the following components:   Troponin I 39.64 (*)    All other components within normal limits  I-STAT CHEM 8, ED - Abnormal; Notable for the following components:   Sodium 130 (*)    BUN 64 (*)    Creatinine, Ser 4.00 (*)    Glucose, Bld 100 (*)    TCO2 19 (*)    All other components within normal limits  I-STAT TROPONIN, ED - Abnormal; Notable for the following components:   Troponin i, poc 23.56 (*)    All other components  within normal limits  I-STAT CG4 LACTIC ACID, ED - Abnormal; Notable for  the following components:   Lactic Acid, Venous 5.37 (*)    All other components within normal limits  APTT  LIPID PANEL  I-STAT CG4 LACTIC ACID, ED    EKG EKG Interpretation  Date/Time:  Thursday February 18 2018 13:58:16 EST Ventricular Rate:  29 PR Interval:    QRS Duration: 111 QT Interval:  571 QTC Calculation: 396 R Axis:   89 Text Interpretation:  Uncertain rhythm: review Prolonged PR interval Consider right atrial enlargement Inferior infarct, acute (RCA) Anteroseptal infarct, age indeterminate Lateral leads are also involved Probable RV involvement, suggest recording right precordial leads >>> Acute MI <<< Confirmed by Shaune Pollack [phone]) on 02/18/2018 2:03:43 PM   Radiology Dg Chest Portable 1 View  Result Date: 02/18/2018 CLINICAL DATA:  84 y/o  F; status post intubation and STEMI. EXAM: PORTABLE CHEST 1 VIEW COMPARISON:  11/26/2007 chest radiograph. FINDINGS: Stable cardiac silhouette given projection and technique. Transcutaneous pacing pads noted. Endotracheal tube tip is just above the carina, the area is obscured by electronic device. Enteric tube tip projects over the proximal stomach. Pulmonary vascular congestion. No consolidation, effusion, or pneumothorax. No acute osseous abnormality is evident. IMPRESSION: Endotracheal tube tip just above the carina, the area is obscured by electronic device, consider repeat radiographs. Enteric tube tip projects over proximal stomach. Pulmonary vascular congestion. Electronically Signed   By: Mitzi Hansen M.D.   On: 02/18/2018 14:40    Procedures .Critical Care Performed by: Shaune Pollack, MD Authorized by: Shaune Pollack, MD   Critical care provider statement:    Critical care time (minutes):  65   Critical care time was exclusive of:  Separately billable procedures and treating other patients and teaching time   Critical care was  necessary to treat or prevent imminent or life-threatening deterioration of the following conditions:  Circulatory failure and cardiac failure   Critical care was time spent personally by me on the following activities:  Development of treatment plan with patient or surrogate, discussions with consultants, evaluation of patient's response to treatment, examination of patient, obtaining history from patient or surrogate, ordering and performing treatments and interventions, ordering and review of laboratory studies, ordering and review of radiographic studies, pulse oximetry, re-evaluation of patient's condition and review of old charts   I assumed direction of critical care for this patient from another provider in my specialty: no   External pacer Date/Time: 02/18/2018 3:38 PM Performed by: Shaune Pollack, MD Authorized by: Shaune Pollack, MD  Consent: The procedure was performed in an emergent situation.  Sedation: Patient sedated: yes Sedatives: etomidate Analgesia: fentanyl  Patient tolerance: Patient tolerated the procedure well with no immediate complications  Procedure Name: Intubation Date/Time: 02/18/2018 3:38 PM Performed by: Shaune Pollack, MD Pre-anesthesia Checklist: Patient identified, Patient being monitored, Emergency Drugs available, Timeout performed and Suction available Oxygen Delivery Method: Non-rebreather mask Preoxygenation: Pre-oxygenation with 100% oxygen Induction Type: Rapid sequence Ventilation: Mask ventilation without difficulty Laryngoscope Size: Glidescope and 3 Grade View: Grade I Tube size: 7.5 mm Number of attempts: 1 Airway Equipment and Method: Rigid stylet and Video-laryngoscopy Placement Confirmation: ETT inserted through vocal cords under direct vision,  CO2 detector and Breath sounds checked- equal and bilateral Tube secured with: Tape Dental Injury: Teeth and Oropharynx as per pre-operative assessment  Future Recommendations: Recommend-  induction with short-acting agent, and alternative techniques readily available      (including critical care time)  Medications Ordered in ED Medications  0.9 %  sodium chloride infusion ( Intravenous New Bag/Given 02/18/18 1505)  aspirin chewable tablet 324 mg ( Oral MAR Hold 02/18/18 1438)  norepinephrine (LEVOPHED) 4 mg in dextrose 5 % 250 mL (0.016 mg/mL) infusion (5 mcg/min Intravenous Rate/Dose Change 02/18/18 1431)  fentaNYL in NS (71mcg/ml) infusion-PREMIX (25 mcg/hr Intravenous Rate/Dose Change 02/18/18 1500)  midazolam (VERSED) 50 mg in sodium chloride 0.9 % 50 mL (1 mg/mL) infusion (0.5 mg/hr Intravenous Rate/Dose Change 02/18/18 1459)  lidocaine (PF) (XYLOCAINE) 1 % injection (15 mLs Infiltration Given 02/18/18 1450)  tirofiban (AGGRASTAT) bolus via infusion 1,700 mcg (has no administration in time range)  tirofiban (AGGRASTAT) infusion 50 mcg/mL 250 mL (has no administration in time range)  bivalirudin (ANGIOMAX) BOLUS via infusion (51 mg Intravenous Given 02/18/18 1505)  bivalirudin (ANGIOMAX) 250 mg in sodium chloride 0.9 % 50 mL (5 mg/mL) infusion (1 mg/kg/hr  68 kg Intravenous New Bag/Given 02/18/18 1508)  tirofiban (AGGRASTAT) bolus via infusion (1,700 mcg Intravenous Given 02/18/18 1515)  tirofiban (AGGRASTAT) infusion 50 mcg/mL 100 mL (0.075 mcg/kg/min  68 kg Intravenous New Bag/Given 02/18/18 1520)  ticagrelor (BRILINTA) tablet (180 mg Oral Given 02/18/18 1514)  atropine 1 MG/10ML injection 1 mg (1 mg Intravenous Given 02/18/18 1415)  atropine 1 MG/10ML injection 1 mg (1 mg Intravenous Given 02/18/18 1400)  etomidate (AMIDATE) injection 15 mg (15 mg Intravenous Given 02/18/18 1417)  rocuronium (ZEMURON) injection 1 mg/kg (50 mg Intravenous Given 02/18/18 1420)  heparin injection 4,000 Units (4,000 Units Intravenous Given 02/18/18 1436)  aspirin suppository 300 mg (300 mg Rectal Given 02/18/18 1436)  midazolam (VERSED) bolus via infusion 2 mg (2 mg  Intravenous Bolus from Bag 02/18/18 1458)     Initial Impression / Assessment and Plan / ED Course  I have reviewed the triage vital signs and the nursing notes.  Pertinent labs & imaging results that were available during my care of the patient were reviewed by me and considered in my medical decision making (see chart for details).     84 yo F here with bradycardia, AMS. Initial EKG shows complete heart block. Pt cool, diaphoretic on exam. Due to new onset CHB with low voltage but flat inferior ST segments, CODE STEMI activated. Repeat EKG shows inferior STEMI with CHB. Pt given atropine, placed on pads immediately on arrival. Minimal to no response noted with atropine. Cards at bedside and decision made to intubate for airway protection. Levophed gtt started while intubating and pt paced once sedated, with good electrical-mechanical capture. BP improved after intubation. CXR shows ET tube slightly deep - retracted 1 cm, OG in place. Taken to cath lab emergently. ASA, heparin administered during resuscitation.  Final Clinical Impressions(s) / ED Diagnoses   Final diagnoses:  ST elevation myocardial infarction (STEMI) involving other coronary artery (HCC)  Complete heart block [facility])    ED Discharge Orders    None       Shaune Pollack, MD 02/18/18 1541    Shaune Pollack, MD 02/18/18 [phone]

## 2018-02-18 NOTE — ED Notes (Signed)
Patient transcutaneously paced at 70, initiated by Dr Erma Heritage.

## 2018-02-18 NOTE — H&P (Addendum)
Cardiology Admission History and Physical:   Patient ID: Charlene Footancy C Falls MRN: [id number]; DOB: 1932-09-18   Admission date: 02/18/2018  Primary Care Provider: Martha ClanShaw, William, MD Primary Cardiologist: New Patient Primary Electrophysiologist:  None   Chief Complaint:  Altered Mental Status   Patient Profile:   Charlene Dickerson is a 84 y.o. female with a history of hypertension but no known cardiac history who [facility] after being found by a neighbor with acute onset of altered mental status. She was found to have an inferior STEMI with complete heart block.   History of Present Illness:   History was obtained from patient's daughter due to patients critical condition and need to be intubated: Patient has reportedly had two days of indigestion and the "stomach flu" with nausea, vomiting, and diarrhea. Patient also reportedly told a friend yesterday that she was having some fluttering in her chest.  A family member called the patient this morning around 7am to check on her but she did not answer the phone. Family member then went over to her house but patient did not open the door. Family member went back over around 9am and knocked on patient's window. Patient eventually came to door and said she felt a little "foggy" in her head but was otherwise feeling well. Daughter called patient again around 12:30pm but patient did not answer the phone. Daughter asked her neighbor to go and check on the patient, and patient was found sitting on the couch with her pants down. Patient appeared altered, was unable to speak clearly, and was unable to lift her arms. Neighbor was on the phone with patient's daughter. Upon hearing this report from the neighbor, her daughter thought she may be having a stroke and told the neighbor to call 911.  ED Course:  Patient was severely bradycardic and hypotensive in the ED with heart rates in the 30's and systolic BP in the 60's. EKG showed inferior  STEMI with complete heart block.  Initial troponin 23.56. Lactic acid 5.37. Na 130, K 5.0, Glucose 100, SCr 4.00. Patient was started on transcutaneous pacer and was given pressors. Systolic BP improved to as high as the 160's once patient was being paced. She was intubated by Dr. Erma HeritageIsaacs. She was given Aspirin suppository and Heparin bolus. Patient then taken to cath lab for emergency cardiac catheterization and temporary pacing wire.  Past Medical History:  Diagnosis Date  . Diverticulosis of colon   . Essential tremor   . GERD (gastroesophageal reflux disease)    occasional (no meds)  . History of basal cell carcinoma excision 2012  . Hypertension   . Osteoporosis   . PONV (postoperative nausea and vomiting)   . Rectocele    symptomatic    Past Surgical History:  Procedure Laterality Date  . BREAST EXCISIONAL BIOPSY Left 1995  . BREAST EXCISIONAL BIOPSY Right 1988  . CATARACT EXTRACTION W/ INTRAOCULAR LENS  IMPLANT, BILATERAL  right 09/ 2013;  left 10/ 2013  . COLONOSCOPY  last one 04/10/ 2012  . D & C HYSTEROSCOPY W/ RESECTION ENDOMETRIAL POLYP AND LESION  07-27-2007   dr Ambrose Mantlehenley @[facility]   . RECTOCELE REPAIR N/A 11/26/2017   Procedure: POSTERIOR REPAIR (RECTOCELE);  Surgeon: Tracey HarriesHenley, Thomas, MD;  [facility];  Service: Gynecology;  Laterality: N/A;  OUTPT IN BED  . TUBAL LIGATION Bilateral yrs ago     Medications Prior to Admission: Prior to Admission medications   Medication Sig Start Date  End Date Taking? Authorizing Provider  ALPRAZolam Prudy Feeler) 0.5 MG tablet Take 0.5 mg by mouth 3 (three) times daily as needed for anxiety.    [provider]  latanoprost (XALATAN) 0.005 % ophthalmic solution Place 1 drop into both eyes at bedtime.  04/17/11   [provider]  losartan (COZAAR) 100 MG tablet Take 100 mg by mouth every morning.    [provider]  propranolol (INDERAL) 10 MG tablet Take 10 mg by mouth 2 (two) times daily. (2) 1 times  daily     [provider]     Allergies:    Allergies  Allergen Reactions  . Clindamycin/Lincomycin Diarrhea    "c-diff"    Social History:   Social History   Socioeconomic History  . Marital status: Widowed    Spouse name: Not on file  . Number of children: Not on file  . Years of education: Not on file  . Highest education level: Not on file  Occupational History  . Not on file  Social Needs  . Financial resource strain: Not on file  . Food insecurity:    Worry: Not on file    Inability: Not on file  . Transportation needs:    Medical: Not on file    Non-medical: Not on file  Tobacco Use  . Smoking status: Former Smoker    Last attempt to quit: 05/08/1961    Years since quitting: 56.8  . Smokeless tobacco: Never Used  Substance and Sexual Activity  . Alcohol use: No  . Drug use: Not on file  . Sexual activity: Not on file  Lifestyle  . Physical activity:    Days per week: Not on file    Minutes per session: Not on file  . Stress: Not on file  Relationships  . Social connections:    Talks on phone: Not on file    Gets together: Not on file    Attends religious service: Not on file    Active member of club or organization: Not on file    Attends meetings of clubs or organizations: Not on file    Relationship status: Not on file  . Intimate partner violence:    Fear of current or ex partner: Not on file    Emotionally abused: Not on file    Physically abused: Not on file    Forced sexual activity: Not on file  Other Topics Concern  . Not on file  Social History Narrative  . Not on file    Family History:   The patient's family history includes Breast cancer in her daughter, maternal aunt, maternal grandmother, and sister; Cancer in her daughter, maternal aunt, maternal grandmother, and sister; Heart disease in her father and sister.    ROS:  Please see the history of present illness.  Review of Systems  Unable to perform ROS: Acuity of  condition (requiring intubation)    Physical Exam/Data:   Vitals:   02/18/18 1402 02/18/18 1409 02/18/18 1410 02/18/18 1500  BP: (!) 65/26 (!) 85/69 (!) 85/69   Pulse: 69 (!) 42 (!) 36   Resp: 14 (!) 21 19   SpO2: (!) 64% 98% 92%   Weight:    68 kg  Height:  5\' 3"  (1.6 m)     No intake or output data in the 24 hours ending 02/18/18 1523 Filed Weights   02/18/18 1500  Weight: 68 kg   Body mass index is 26.56 kg/m.  General:  85 year  old Caucasian female in acute distress with some altered mental status. HEENT: Normal. Neck: JVD unable to be assessed due to patient's position. Endocrine:  No thyromegaly. Vascular:  Radial pulses and distal pedal pulses present. Cardiac:  Severely bradycardic with heart rate in the 30's with regular rhythm. No significant murmurs, gallops, or rubs appreciated. Lungs: Clear to auscultation anteriorly (unable to sit patient up due to critical condition). No significant crackles appreciated. Abd: Soft, non-tender, and non-distended. Ext: No lower extremity edema. Musculoskeletal:  No deformities. Skin: Warm and dry  Neuro:  No focal deficits noted. Psych:  Slightly agitated at times with altered mental status.   EKG:  The ECG that was done was personally reviewed and demonstrates ST elevations in inferior leads with complete heart block with ventricular rate of 29 bpm.  Relevant CV Studies: None.  Laboratory Data:  Chemistry Recent Labs  Lab 02/18/18 1411  NA 130*  K 5.0  CL 100  GLUCOSE 100*  BUN 64*  CREATININE 4.00*    No results for input(s): PROT, ALBUMIN, AST, ALT, ALKPHOS, BILITOT in the last 168 hours. Hematology Recent Labs  Lab 02/18/18 1405 02/18/18 1411  WBC 12.6*  --   RBC 3.81*  --   HGB 12.4 12.6  HCT 38.9 37.0  MCV 102.1*  --   MCH 32.5  --   MCHC 31.9  --   RDW 13.0  --   PLT 102*  --    Cardiac EnzymesNo results for input(s): TROPONINI in the last 168 hours.  Recent Labs  Lab 02/18/18 1410  TROPIPOC  23.56*    BNPNo results for input(s): BNP, PROBNP in the last 168 hours.  DDimer No results for input(s): DDIMER in the last 168 hours.  Radiology/Studies:  Dg Chest Portable 1 View  Result Date: 02/18/2018 CLINICAL DATA:  84 y/o  F; status post intubation and STEMI. EXAM: PORTABLE CHEST 1 VIEW COMPARISON:  11/26/2007 chest radiograph. FINDINGS: Stable cardiac silhouette given projection and technique. Transcutaneous pacing pads noted. Endotracheal tube tip is just above the carina, the area is obscured by electronic device. Enteric tube tip projects over the proximal stomach. Pulmonary vascular congestion. No consolidation, effusion, or pneumothorax. No acute osseous abnormality is evident. IMPRESSION: Endotracheal tube tip just above the carina, the area is obscured by electronic device, consider repeat radiographs. Enteric tube tip projects over proximal stomach. Pulmonary vascular congestion. Electronically Signed   By: Mitzi Hansen M.D.   On: 02/18/2018 14:40    Assessment and Plan:   Inferior STEMI with Complete Heart Block - Patient presented to the ED via EMS with altered mental status. - Initial EKG showed complete heart blocker. Repeat EKG showed ST elevations in inferior lead and complete heart block with ventricular rate of 29 bpm. - Initial troponin 23.56 - Lactic acid 5.37. - Serum creatinine 4.0. - In the ED: Patient received atropine with no improvement. Patient then transcutaneously paced, given Levophed, and intubated. She also received Aspirin suppository and Heparin bolus. - Patient taken to cath lab for emergency cardiac catheterization and temporary pacing wire. - Critical care consulted.  Severity of Illness: The appropriate patient status for this patient is INPATIENT. Inpatient status is judged to be reasonable and necessary in order to provide the required intensity of service to ensure the patient's safety. The patient's presenting symptoms, physical  exam findings, and initial radiographic and laboratory data in the context of their chronic comorbidities is felt to place them at high risk for further clinical deterioration.  Furthermore, it is not anticipated that the patient will be medically stable for discharge from the hospital within 2 midnights of admission. The following factors support the patient status of inpatient.   " The patient's presenting symptoms include altered mental status. " The worrisome physical exam findings include severe bradycardia and hypotension. " The initial radiographic and laboratory data are worrisome because of troponin of 23.56, lactic acid 5.37, serum creatinine 4.0. EKG showed inferior STEMI with complete heart block. " The chronic co-morbidities include hypertension.   * I certify that at the point of admission it is my clinical judgment that the patient will require inpatient hospital care spanning beyond 2 midnights from the point of admission due to high intensity of service, high risk for further deterioration and high frequency of surveillance required.*    For questions or updates, please contact [facility] Please consult [email or web address] for contact info under        Signed, Corrin Parker, PA-C  02/18/2018 3:23 PM

## 2018-02-18 NOTE — Consult Note (Addendum)
NAME:  Charlene Dickerson, MRN:  [id number], DOB:  [birth year], LOS: 0 ADMISSION DATE:  02/18/2018, CONSULTATION DATE:  02/18/18 REFERRING MD:  Allyson SabalBerry - Cardiology-, CHIEF COMPLAINT:  AMS, STEMI  Brief History   Charlene Dickerson is an 84 yo F with PMH significant for HTN who was found with acute AMS. She was found to have an inferior STEMI with associated complete heart block and underwent PCI and is admitted to [facility] ICU. She remains intubated, sedated.  History of present illness   Patient is 84 yo female presenting to ED with acute onset AMS, arriving via EMS transport. Patient reportedly experienced feelings of n/v/d, indigestion x 2 days and palpitations x 1 day. At approximately 0900 the patient was seen by a family member and complained of head fogginess at this time. Per family request, a neighbor checked on the patient in the afternoon after the patient did not answer the phone at approx 1230. The patient was found with acute AMS, impaired speech, impaired upper extremity mobility. 911 was called at this time with concern for CVA and the patient was subsequently transported to ED.  Upon arrival to the ED the patient was hypotensive with SBPs 60s and bradycardic to HR 30s. ECG revealed inferior STEMI with complete heart block. Patient received atropine without improvement and was then transcutaneously paced to rate 70 yielding SBP improvement to 160s. Initial labs significant for trop-I 39.64, Serum Creatinine 4.00, BUN 64, K+ 5.0, Na+ 130, Lactic Acid 5.37, Hgb/HCT 12.6/37. While in the ED the patient was intubated, given ASA and started heparin bolus. She was then transported for cardiac catheterization.  After completion of procedure patient transported to ICU for recover. PCCM asked to consult for management of vent, shock.   Past Medical History  HTN  Significant Hospital Events   12/19- Presentation to ED, inferior STEMI 12/19- cardiac cath   Consults:  PCCM consulted to manage vent,  pressors/shock   Procedures:  12/19 ETT intubation (in ED) 12/19 Cardiac Catheterization- Middle RCA to distal RCA 100% stenosed. Prox RCA 99% stenosis. Circumflex 80% stenosed. LAD 50% stenosis.  3x drug eluting stents at RCA    Significant Diagnostic Tests:  12/19 CXR- personally reviewed- TC pacing pads partially obscure lung field view, cardiac silhouette. Appreciate appearance of generalized pulmonary vascular congestion.  Micro Data:  N/a at this time  Antimicrobials:  N/a at this time   Interim history/subjective:  12/19 Patient presented to ED with AMS, hypotension and bradycardia requiring TC pacing. She was found to have inferior STEMI with complete heart block and underwent cardiac catheterization with subsequent admission to ICU. On 2.5 norepi, fentanyl gtt, midazolam gtt for sedation, intubated.  Objective   Blood pressure 112/67, pulse 70, resp. rate 20, height 5\' 3"  (1.6 m), weight 68 kg, SpO2 90 %.    FiO2 (%):  [100 %] 100 %  No intake or output data in the 24 hours ending 02/18/18 1656 Filed Weights   02/18/18 1500  Weight: 68 kg    Examination: General: frail appearing elderly adult female, sedated, on vent.  HENT: Normocephalic, atraumatic. Patent nares. Moist mucus mumbranes.  Lungs: ETT present, on vent. Lungs CTA  Cardiovascular: Mild JVD. Dopplerable pedal pulses bilaterally 1+ radial pulses bilaterally. S1S2 no r/g/m. Abdomen: Soft, flat, normoactive x4 quadrants.  Extremities: No obvious joint deformity. No pedal edema.  Neuro: RASS -2.  Unable to assess CAM-ICU. Does not follow commands.  Skin: Warm, dry. R femoral dressing with serous fluid.   Resolved  Hospital Problem list     Assessment & Plan:   Shock -favor cardiogenic origin given inferior STEMI -CXR reveals pulmonary vasculature congestion  -SBP 60s HR 30s on arrival to ED -s/p PCI now Plan -wean norepinephrine gtt as able for MAP gal > 65  -per bedside ECHO, IVC diameter suggests  adequate to hypervolemic status.  -Recommend KVO at this time.  Inferior STEMI with complete heart block -Trop-I 39 -ECG reviewed -Patient underwent PCI  -Hgb/HCT 12.6/37 -ASA and Heparin given -Transvenous pacer placed 12/19 Plan -Manage per primary team (Cardiology)  -trend trops q6hr -Continuous cardiac monitoring  -Monitor R fem sheath site for bleeding -Trend H/H with hgb goal > 8.0 with CBC qD -Antiplt/anticoag per primary    Respiratory Failure in post-procedural setting -Intubated in ED in anticipation of PCI -Remains intubated in ICU Plan -Wean vent as able -Sedation-- Fentanyl gtt and Midazolam gtt -Wean sedation as patient tolerates and as safely able -Avoid Dexmet, prop given r/o hemodynamic instability   Acute Kidney Injury -BUN Cr on arrival 64 and 4.00 respectively -Likely etiology related to malperfusion 2/2/ cardiogenic shock  Plan -Trend daily BMP to assess kidney function -Maintain MAPs > 65 -Monitor for metabolic derangements  Lactic Acidosis -LA 5.37 on arrival to ED -likely secondary to shock Plan -Maintain SBP > 65 -trend LA PRN   Transaminitis -etiology likely related to shock -AST 970 ALT 972 Plan -Trend PRN -Monitor for clinical signs of synthetic liver dysfunction (bleeding)  HTN -chronic disease process -Holding home Cozaar, propranolol Plan -PRN labetalol, hydralazine for high blood pressure -while in ICU care, MAP goal > 65 -prior to discharge will need close coordination and planning for home cardiovascular medication regimen   Best practice:  Diet: NPO Pain/Anxiety/Delirium protocol (if indicated): Fentanyl, Midazolam VAP protocol (if indicated): Yes DVT prophylaxis: per primary GI prophylaxis: Protonix 40mg  qD  Glucose control:  Mobility: bedrest Code Status: Full  Family Communication: Daughter at bedside Disposition: ICU   Labs   CBC: Recent Labs  Lab 02/18/18 1405 02/18/18 1411  WBC 12.6*  --   NEUTROABS  9.8*  --   HGB 12.4 12.6  HCT 38.9 37.0  MCV 102.1*  --   PLT 102*  --     Basic Metabolic Panel: Recent Labs  Lab 02/18/18 1405 02/18/18 1411  NA 132* 130*  K 5.1 5.0  CL 96* 100  CO2 16*  --   GLUCOSE 103* 100*  BUN 66* 64*  CREATININE 3.80* 4.00*  CALCIUM 9.9  --    GFR: Estimated Creatinine Clearance: 9.9 mL/min (A) (by C-G formula based on SCr of 4 mg/dL (H)). Recent Labs  Lab 02/18/18 1405 02/18/18 1412  WBC 12.6*  --   LATICACIDVEN  --  5.37*    Liver Function Tests: Recent Labs  Lab 02/18/18 1405  AST 970*  ALT 972*  ALKPHOS 26*  BILITOT 2.5*  PROT 6.2*  ALBUMIN 3.7   No results for input(s): LIPASE, AMYLASE in the last 168 hours. No results for input(s): AMMONIA in the last 168 hours.  ABG    Component Value Date/Time   TCO2 19 (L) 02/18/2018 1411     Coagulation Profile: Recent Labs  Lab 02/18/18 1405  INR 1.63    Cardiac Enzymes: Recent Labs  Lab 02/18/18 1405  TROPONINI 39.64*    HbA1C: No results found for: HGBA1C  CBG: No results for input(s): GLUCAP in the last 168 hours.  Review of Systems:   Unable to obtain as  patient is encephalopathic, intubated, sedated  Past Medical History  She,  has a past medical history of Diverticulosis of colon, Essential tremor, GERD (gastroesophageal reflux disease), History of basal cell carcinoma excision (2012), Hypertension, Osteoporosis, PONV (postoperative nausea and vomiting), and Rectocele.   Surgical History    Past Surgical History:  Procedure Laterality Date  . BREAST EXCISIONAL BIOPSY Left 1995  . BREAST EXCISIONAL BIOPSY Right 1988  . CATARACT EXTRACTION W/ INTRAOCULAR LENS  IMPLANT, BILATERAL  right 09/ 2013;  left 10/ 2013  . COLONOSCOPY  last one 04/10/ 2012  . D & C HYSTEROSCOPY W/ RESECTION ENDOMETRIAL POLYP AND LESION  07-27-2007   dr Ambrose Mantle @[facility]   . RECTOCELE REPAIR N/A 11/26/2017   Procedure: POSTERIOR REPAIR (RECTOCELE);  Surgeon: Tracey Harries, MD;  Location:  [facility];  Service: Gynecology;  Laterality: N/A;  OUTPT IN BED  . TUBAL LIGATION Bilateral yrs ago     Social History   reports that she quit smoking about 56 years ago. She has never used smokeless tobacco. She reports that she does not drink alcohol.   Family History   Her family history includes Breast cancer in her daughter, maternal aunt, maternal grandmother, and sister; Cancer in her daughter, maternal aunt, maternal grandmother, and sister; Heart disease in her father and sister.   Allergies Allergies  Allergen Reactions  . Clindamycin/Lincomycin Diarrhea    "c-diff"     Home Medications  Prior to Admission medications   Medication Sig Start Date End Date Taking? Authorizing Provider  ALPRAZolam Prudy Feeler) 0.5 MG tablet Take 0.5 mg by mouth 3 (three) times daily as needed for anxiety.    [provider]  latanoprost (XALATAN) 0.005 % ophthalmic solution Place 1 drop into both eyes at bedtime.  04/17/11   [provider]  losartan (COZAAR) 100 MG tablet Take 100 mg by mouth every morning.    [provider]  propranolol (INDERAL) 10 MG tablet Take 10 mg by mouth 2 (two) times daily. (2) 1 times daily     [provider]     Critical care time:     CRITICAL CARE Performed by: Lanier Clam MSN, AGACNP-BC   Total critical care time: 50 minutes  Critical care time was exclusive of separately billable procedures and treating other patients. Critical care was necessary to treat or prevent imminent or life-threatening deterioration.  Critical care was time spent personally by me on the following activities: development of treatment plan with patient and/or surrogate as well as nursing, discussions with consultants, evaluation of patient's response to treatment, examination of patient, obtaining history from patient or surrogate, ordering and performing treatments and interventions, ordering and review of laboratory studies,  ordering and review of radiographic studies, pulse oximetry and re-evaluation of patient's condition.

## 2018-02-18 NOTE — Progress Notes (Signed)
ACT 135. Femoral arterial sheath removed. Vascular site level 0. Levophed was titrated to maintain MAP > 65. No complications.

## 2018-02-18 NOTE — ED Notes (Signed)
CRITICAL VALUE ALERT  Critical Value:  Lactic 5.37  Date & Time Notied:  02/18/18 1416  Provider Notified: isaacs  Orders Received/Actions taken: MD in room, code stemi

## 2018-02-18 NOTE — Progress Notes (Signed)
PT was intubated and bagged to Cath Lab

## 2018-02-19 ENCOUNTER — Encounter (HOSPITAL_COMMUNITY): Payer: Self-pay | Admitting: Cardiovascular Disease

## 2018-02-19 ENCOUNTER — Inpatient Hospital Stay (HOSPITAL_COMMUNITY): Payer: Medicare Other

## 2018-02-19 DIAGNOSIS — I351 Nonrheumatic aortic (valve) insufficiency: Secondary | ICD-10-CM

## 2018-02-19 DIAGNOSIS — I2111 ST elevation (STEMI) myocardial infarction involving right coronary artery: Secondary | ICD-10-CM

## 2018-02-19 DIAGNOSIS — N179 Acute kidney failure, unspecified: Secondary | ICD-10-CM

## 2018-02-19 DIAGNOSIS — J9601 Acute respiratory failure with hypoxia: Secondary | ICD-10-CM

## 2018-02-19 DIAGNOSIS — I361 Nonrheumatic tricuspid (valve) insufficiency: Secondary | ICD-10-CM

## 2018-02-19 DIAGNOSIS — K7200 Acute and subacute hepatic failure without coma: Secondary | ICD-10-CM

## 2018-02-19 LAB — BASIC METABOLIC PANEL
ANION GAP: 18 — AB (ref 5–15)
BUN: 73 mg/dL — ABNORMAL HIGH (ref 8–23)
CO2: 15 mmol/L — ABNORMAL LOW (ref 22–32)
Calcium: 8.9 mg/dL (ref 8.9–10.3)
Chloride: 102 mmol/L (ref 98–111)
Creatinine, Ser: 2.94 mg/dL — ABNORMAL HIGH (ref 0.44–1.00)
GFR calc Af Amer: 16 mL/min — ABNORMAL LOW (ref >60)
GFR calc non Af Amer: 14 mL/min — ABNORMAL LOW (ref >60)
Glucose, Bld: 102 mg/dL — ABNORMAL HIGH (ref 70–99)
POTASSIUM: 4.8 mmol/L (ref 3.5–5.1)
Sodium: 135 mmol/L (ref 135–145)

## 2018-02-19 LAB — ECHOCARDIOGRAM COMPLETE
Height: 63 in
Weight: 2398.6 oz

## 2018-02-19 LAB — CBC
HCT: 37.4 % (ref 36.0–46.0)
Hemoglobin: 12.1 g/dL (ref 12.0–15.0)
MCH: 33.3 pg (ref 26.0–34.0)
MCHC: 32.4 g/dL (ref 30.0–36.0)
MCV: 103 fL — ABNORMAL HIGH (ref 80.0–100.0)
NRBC: 0 % (ref 0.0–0.2)
Platelets: 115 10*3/uL — ABNORMAL LOW (ref 150–400)
RBC: 3.63 MIL/uL — ABNORMAL LOW (ref 3.87–5.11)
RDW: 13.1 % (ref 11.5–15.5)
WBC: 16.9 10*3/uL — ABNORMAL HIGH (ref 4.0–10.5)

## 2018-02-19 LAB — POCT ACTIVATED CLOTTING TIME: ACTIVATED CLOTTING TIME: 136 s

## 2018-02-19 LAB — TROPONIN I: Troponin I: 57.84 ng/mL (ref <0.03)

## 2018-02-19 MED ORDER — ENOXAPARIN SODIUM 30 MG/0.3ML ~~LOC~~ SOLN
30.0000 mg | SUBCUTANEOUS | Status: DC
  Administered 2018-02-20 – 2018-02-21 (×2): 30 mg via SUBCUTANEOUS
  Filled 2018-02-19 (×2): qty 0.3

## 2018-02-19 MED ORDER — FUROSEMIDE 10 MG/ML IJ SOLN
80.0000 mg | Freq: Once | INTRAMUSCULAR | Status: AC
  Administered 2018-02-19: 80 mg via INTRAVENOUS
  Filled 2018-02-19: qty 8

## 2018-02-19 MED ORDER — ORAL CARE MOUTH RINSE
15.0000 mL | OROMUCOSAL | Status: DC
  Administered 2018-02-19 (×4): 15 mL via OROMUCOSAL

## 2018-02-19 MED ORDER — PANTOPRAZOLE SODIUM 40 MG PO PACK
40.0000 mg | PACK | Freq: Every day | ORAL | Status: DC
  Administered 2018-02-19 – 2018-02-21 (×3): 40 mg
  Filled 2018-02-19 (×5): qty 20

## 2018-02-19 MED ORDER — CHLORHEXIDINE GLUCONATE 0.12% ORAL RINSE (MEDLINE KIT)
15.0000 mL | Freq: Two times a day (BID) | OROMUCOSAL | Status: DC
  Administered 2018-02-19: 15 mL via OROMUCOSAL

## 2018-02-19 MED FILL — Heparin Sod (Porcine)-NaCl IV Soln 1000 Unit/500ML-0.9%: INTRAVENOUS | Qty: 1000 | Status: AC

## 2018-02-19 NOTE — Procedures (Signed)
Extubation Procedure Note  Patient Details:   Name: Charlene Dickerson DOB: 08-13-1932 MRN: [id number]   Airway Documentation:    Vent end date: 02/19/18 Vent end time: 1222   Evaluation  O2 sats: stable throughout Complications: No apparent complications Patient did tolerate procedure well. Bilateral Breath Sounds: Clear, Diminished   Yes   Patient extubated per order to 4L [place] with no apparent complications. Positive cuff leak was noted prior to extubation. Patient is alert and oriented to place and is able to speak. Vitals are stable. RT will continue to monitor.   Terris Germano Lajuana Ripple 02/19/2018, 12:26 PM

## 2018-02-19 NOTE — Progress Notes (Signed)
SLP Cancellation Note  Patient Details Name: Charlene Dickerson MRN: [id number] DOB: [birth year]   Cancelled treatment:       Reason Eval/Treat Not Completed: Patient unavailable (Per RN, pt passed initial screen, and is tolerating ice chips.) Will continue efforts to assess swallow function and safety.  Emmalou Hunger B. [facility], CCC-SLP Speech Language Pathologist [phone]  Leigh Aurora 02/19/2018, 1:22 PM

## 2018-02-19 NOTE — Progress Notes (Signed)
NAME:  Charlene Dickerson, MRN:  [id number], DOB:  May 12, 1932, LOS: 1 ADMISSION DATE:  02/18/2018, CONSULTATION DATE:  02/18/2018 REFERRING MD:  Erlene QuanBerry, J - [facility], CHIEF COMPLAINT:  Respiratory failure.   HPI/course in hospital  84 year old woman referred for ventilator management. She suffered an IW STEMI with cardiogenic shock, requiring vasopressors and intubation. She had two Synergy DES deployed into the proximal RCA, placement of a temporary pacing wire. She was maintained on Aggrastat infusion to control luminal thrombus.   Past Medical History  She,  has a past medical history of Diverticulosis of colon, Essential tremor, GERD (gastroesophageal reflux disease), History of basal cell carcinoma excision (2012), Hypertension, Osteoporosis, PONV (postoperative nausea and vomiting), and Rectocele.  Past Surgical History:  Procedure Laterality Date  . BREAST EXCISIONAL BIOPSY Left 1995  . BREAST EXCISIONAL BIOPSY Right 1988  . CATARACT EXTRACTION W/ INTRAOCULAR LENS  IMPLANT, BILATERAL  right 09/ 2013;  left 10/ 2013  . COLONOSCOPY  last one 04/10/ 2012  . CORONARY STENT INTERVENTION N/A 02/18/2018   Procedure: CORONARY STENT INTERVENTION;  Surgeon: Runell GessBerry, Jonathan J, MD;  [facility]: [facility];  Service: Cardiovascular;  Laterality: N/A;  . D & C HYSTEROSCOPY W/ RESECTION ENDOMETRIAL POLYP AND LESION  07-27-2007   dr Ambrose Mantlehenley @[facility]   . LEFT HEART CATH AND CORONARY ANGIOGRAPHY N/A 02/18/2018   Procedure: LEFT HEART CATH AND CORONARY ANGIOGRAPHY;  Surgeon: Runell GessBerry, Jonathan J, MD;  Location: [facility];  Service: Cardiovascular;  Laterality: N/A;  . RECTOCELE REPAIR N/A 11/26/2017   Procedure: POSTERIOR REPAIR (RECTOCELE);  Surgeon: Tracey HarriesHenley, Thomas, MD;  [facility];  Service: Gynecology;  Laterality: N/A;  OUTPT IN BED  . TEMPORARY PACEMAKER N/A 02/18/2018   Procedure: TEMPORARY PACEMAKER;  Surgeon: Runell GessBerry, Jonathan J, MD;  Location: [facility] INVASIVE CV LAB;   Service: Cardiovascular;  Laterality: N/A;  . TUBAL LIGATION Bilateral yrs ago      Interim history/subjective:  Bleeding from cath site controlled with overstitch and thrombi-pad. Tolerating SBT this morning.  Objective   Blood pressure 93/77, pulse 70, temperature 98.4 F (36.9 C), temperature source Oral, resp. rate 20, height 5\' 3"  (1.6 m), weight 68 kg, SpO2 100 %.    Vent Mode: PSV;CPAP FiO2 (%):  [40 %-100 %] 40 % Set Rate:  [18 bmp] 18 bmp Vt Set:  [420 mL] 420 mL PEEP:  [5 cmH20] 5 cmH20 Pressure Support:  [10 cmH20] 10 cmH20 Plateau Pressure:  [12 cmH20-16 cmH20] 13 cmH20   Intake/Output Summary (Last 24 hours) at 02/19/2018 1005 Last data filed at 02/19/2018 1000 Gross per 24 hour  Intake 1079.3 ml  Output 735 ml  Net 344.3 ml   Filed Weights   02/18/18 1500  Weight: 68 kg    Examination: Physical Exam  Constitutional: She appears well-developed.  HENT:  Head: Normocephalic and atraumatic.  Eyes: Pupils are equal, round, and reactive to light.  Neck: Neck supple. JVD present.  Cardiovascular: Normal heart sounds. Exam reveals no gallop and no friction rub.  No murmur heard. Respiratory: Effort normal and breath sounds normal.  No ventilator asynchrony  GI: Soft. Bowel sounds are normal. There is no abdominal tenderness.  Neurological: She is alert. She has normal strength. GCS eye subscore is 4. GCS verbal subscore is 1. GCS motor subscore is 6.  Skin: Skin is warm, dry and intact. No cyanosis. Nails show no clubbing.     Ancillary tests (personally reviewed)  CBC: Recent Labs  Lab 02/18/18 1405 02/18/18 1411 02/18/18 1840 02/19/18 0516  WBC 12.6*  --  10.6* 16.9*  NEUTROABS 9.8*  --   --   --   HGB 12.4 12.6 11.1* 12.1  HCT 38.9 37.0 32.9* 37.4  MCV 102.1*  --  100.3* 103.0*  PLT 102*  --  99* 115*    Basic Metabolic Panel: Recent Labs  Lab 02/18/18 1405 02/18/18 1411 02/19/18 0516  NA 132* 130* 135  K 5.1 5.0 4.8  CL 96* 100 102    CO2 16*  --  15*  GLUCOSE 103* 100* 102*  BUN 66* 64* 73*  CREATININE 3.80* 4.00* 2.94*  CALCIUM 9.9  --  8.9   GFR: Estimated Creatinine Clearance: 13.4 mL/min (A) (by C-G formula based on SCr of 2.94 mg/dL (H)). Recent Labs  Lab 02/18/18 1405 02/18/18 1412 02/18/18 1840 02/19/18 0516  WBC 12.6*  --  10.6* 16.9*  LATICACIDVEN  --  5.37*  --   --     Liver Function Tests: Recent Labs  Lab 02/18/18 1405  AST 970*  ALT 972*  ALKPHOS 26*  BILITOT 2.5*  PROT 6.2*  ALBUMIN 3.7   No results for input(s): LIPASE, AMYLASE in the last 168 hours. No results for input(s): AMMONIA in the last 168 hours.  ABG    Component Value Date/Time   PHART 7.356 02/18/2018 1828   PCO2ART 34.1 02/18/2018 1828   PO2ART 391.0 (H) 02/18/2018 1828   HCO3 19.3 (L) 02/18/2018 1828   TCO2 20 (L) 02/18/2018 1828   ACIDBASEDEF 6.0 (H) 02/18/2018 1828   O2SAT 100.0 02/18/2018 1828     Coagulation Profile: Recent Labs  Lab 02/18/18 1405  INR 1.63    Cardiac Enzymes: Recent Labs  Lab 02/18/18 1405 02/18/18 1840 02/18/18 2245 02/19/18 0516  TROPONINI 39.64* >65.00* >65.00* 57.84*    HbA1C: No results found for: HGBA1C  CBG: Recent Labs  Lab 02/18/18 1852  GLUCAP 97   Echocardiogram (personally reviewed): Inferior LV WMA, but global function normal. RV at least. moderately enlarged and moderately impaired function. Elevated left-sided filling pressures by Doppler.  Assessment & Plan:   Critically ill due to acute respiratory failure requiring mechanical ventilation Cardiogenic/vasodilatory shock requiring titration of Levophed. S/P STEMI with evidence of RV involvement. AKI  Plan: SBT today and potential extubation. May need BiPAP as backup Extubation may help RV function. Attempt to diurese to facilitate separation from mechanical ventilation. Follow renal function and monitor volume status. Continue to pace at rate of 90 to improve CO especially in face of RV  dysfunction.  Best practice:  Diet: NPO, but advance once extubated.  Pain/Anxiety/Delirium protocol (if indicated): fentanyl and versed. Titrate to RASS 0. At high risk of accumulation given age and renal function. VAP protocol (if indicated): bundle in place. DVT prophylaxis: On Aggrestat. GI prophylaxis: Famotidine Glucose control: Phase I Mobility: Bed rest, Phase I Cardiac rehabilitation once extubated. Code Status: Full. Family Communication: Updated at bedside. Disposition:  ICU   Critical care time: 40 min including assessment of patient, review of echo and labs, and supervision and adjustment of mechanical ventilation.    Lynnell Catalan, MD [facility] ICU Physician [facility]  Pager: [phone] Mobile: [phone] After hours: [phone].  02/19/2018, 10:05 AM

## 2018-02-19 NOTE — Care Management (Signed)
#  3.  S/W  LEANNE  @  [employer]  RX # [phone]  TICAGRELOR : NONE FORMULARY  BRILINTA 90 MG BID COVER- YES CO-PAY- $ 2,531.00 TIER- 2 DRUG PRIOR APPROVAL- NO  DEDUCTIBLE : NOT MET  PREFERRED PHARMACY : YES -  [facility]  ALTERNATIVE: 1. CLOPIDOGREL  75 MG  DAILY COVER- YES CO-PAY - $ 5.45 TIER- 1 DRUG PRIOR APPROVAL- NO  2. CLOPIDOGREL 75 MG BID COVER- YES CO-PAY- $ 10.00 TIER- 1 DRUG PRIOR APPROVAL- NO

## 2018-02-19 NOTE — Plan of Care (Signed)
  Problem: Activity: Goal: Ability to return to baseline activity level will improve Outcome: Progressing   Problem: Cardiovascular: Goal: Ability to achieve and maintain adequate cardiovascular perfusion will improve Outcome: Progressing Goal: Vascular access site(s) Level 0-1 will be maintained Outcome: Progressing   

## 2018-02-19 NOTE — Progress Notes (Signed)
 of Fentanyl and  75ml of Versed wasted in waste bin in med room. Toniann Fail witnessed waste.

## 2018-02-19 NOTE — Progress Notes (Signed)
  Echocardiogram 2D Echocardiogram has been performed.  Adamae Ricklefs T Dan Dissinger 02/19/2018, 9:58 AM

## 2018-02-19 NOTE — Progress Notes (Signed)
Progress Note  Patient Name: Charlene Dickerson Date of Encounter: 02/19/2018  Primary Cardiologist: Buford Dresser, MD, PhD (new)  Subjective   Intubated this AM but awake and alert. When I was talking to her daughter, she was making hand signals to try to correct information. Weaning pressors, but blood pressure still borderline. Sedation on hold for SBT and possible extubation.  Confirmed with daughter that she was very healthy at baseline, walked 3 miles/day, nonsmoker, lived independently. No prior cardiac history.  Inpatient Medications    Scheduled Meds: . aspirin  81 mg Oral Daily  . chlorhexidine gluconate (MEDLINE KIT)  15 mL Mouth Rinse BID  . mouth rinse  15 mL Mouth Rinse 10 times per day  . pantoprazole sodium  40 mg Per Tube QHS  . sodium chloride flush  3 mL Intravenous Q12H  . ticagrelor  90 mg Oral BID   Continuous Infusions: . sodium chloride 50 mL/hr at 02/18/18 1615  . sodium chloride    . fentaNYL infusion INTRAVENOUS 25 mcg/hr (02/19/18 0600)  . midazolam (VERSED) infusion 0.5 mg/hr (02/19/18 0600)  . norepinephrine (LEVOPHED) Adult infusion 17 mcg/min (02/19/18 0751)   PRN Meds: sodium chloride, acetaminophen, fentaNYL, midazolam, ondansetron (ZOFRAN) IV, sodium chloride flush   Vital Signs    Vitals:   02/19/18 0746 02/19/18 0747 02/19/18 0758 02/19/18 0800  BP: (!) 106/92   93/77  Pulse: 76   70  Resp: (!) 26   20  Temp:      TempSrc:      SpO2: 100% 100% 100% 100%  Weight:      Height:        Intake/Output Summary (Last 24 hours) at 02/19/2018 0924 Last data filed at 02/19/2018 0600 Gross per 24 hour  Intake 1079.3 ml  Output 585 ml  Net 494.3 ml   Filed Weights   02/18/18 1500  Weight: 68 kg    Telemetry    RV paced- Personally Reviewed  ECG    This am, baseline artifact, question afib vs junctional rhythm at a rate near 50. Resolving inferior ST elevations with developing Q waves - Personally Reviewed  Physical  Exam   GEN: Intubated but alert, responds to questions and commands. Neck: JVD just above clavicle at 30 degrees Cardiac: regular paced rhythm on exam, no murmurs, rubs, or gallops.  Respiratory: Clear to auscultation, mechanical vent sounds GI: Soft, nontender, non-distended  MS: No edema; No deformity. Neuro:  Nonfocal. Moves all extremities independently, follows commands Psych: intubated but not agitated off sedation  Labs    Chemistry Recent Labs  Lab 02/18/18 1405 02/18/18 1411 02/19/18 0516  NA 132* 130* 135  K 5.1 5.0 4.8  CL 96* 100 102  CO2 16*  --  15*  GLUCOSE 103* 100* 102*  BUN 66* 64* 73*  CREATININE 3.80* 4.00* 2.94*  CALCIUM 9.9  --  8.9  PROT 6.2*  --   --   ALBUMIN 3.7  --   --   AST 970*  --   --   ALT 972*  --   --   ALKPHOS 26*  --   --   BILITOT 2.5*  --   --   GFRNONAA 10*  --  14*  GFRAA 12*  --  16*  ANIONGAP 20*  --  18*     Hematology Recent Labs  Lab 02/18/18 1405 02/18/18 1411 02/18/18 1840 02/19/18 0516  WBC 12.6*  --  10.6* 16.9*  RBC 3.81*  --  3.28* 3.63*  HGB 12.4 12.6 11.1* 12.1  HCT 38.9 37.0 32.9* 37.4  MCV 102.1*  --  100.3* 103.0*  MCH 32.5  --  33.8 33.3  MCHC 31.9  --  33.7 32.4  RDW 13.0  --  13.1 13.1  PLT 102*  --  99* 115*    Cardiac Enzymes Recent Labs  Lab 02/18/18 1405 02/18/18 1840 02/18/18 2245 02/19/18 0516  TROPONINI 39.64* >65.00* >65.00* 57.84*    Recent Labs  Lab 02/18/18 1410  TROPIPOC 23.56*     BNPNo results for input(s): BNP, PROBNP in the last 168 hours.   DDimer No results for input(s): DDIMER in the last 168 hours.   Radiology    Dg Chest Portable 1 View  Result Date: 02/18/2018 CLINICAL DATA:  84 y/o  F; status post intubation and STEMI. EXAM: PORTABLE CHEST 1 VIEW COMPARISON:  11/26/2007 chest radiograph. FINDINGS: Stable cardiac silhouette given projection and technique. Transcutaneous pacing pads noted. Endotracheal tube tip is just above the carina, the area is obscured  by electronic device. Enteric tube tip projects over the proximal stomach. Pulmonary vascular congestion. No consolidation, effusion, or pneumothorax. No acute osseous abnormality is evident. IMPRESSION: Endotracheal tube tip just above the carina, the area is obscured by electronic device, consider repeat radiographs. Enteric tube tip projects over proximal stomach. Pulmonary vascular congestion. Electronically Signed   By: Kristine Garbe M.D.   On: 02/18/2018 14:40    Cardiac Studies   Emergent cath 02/18/18 [name] had an acute inferior STEMI complicated by complete heart block and cardiogenic shock.  Her heart rate improved with placement of a temporary transvenous pacemaker.  Her RCA intervention was difficult because of the proximal lesion which was highly calcified and resistant to balloon dilatation as well as extensive thrombus throughout the remainder of the vessel.  Ultimately I was able to restore antegrade flow with placement of 3 stents and aggressive angioplasty.  She was placed on Aggrastat drip.  The Angiomax was discontinued at the end of the case.  Patient Profile     84 y.o. female with a history of hypertension but no known cardiac history who presented to the [facility] ED via EMS after being found by a neighbor with acute onset of altered mental status. She was found to have an inferior STEMI with complete heart block.  Assessment & Plan    Inferior STEMI, complicated by complete heart block, cardiogenic shock, acute kidney injury, and acute liver injury, with elevated lactate: -presented with hypotension and altered mental status, required emergent intubation for airway protection, temporary transcutaneous pacing, and vasopressors -she is now s/p emergent cath for STEMI on 12/19, receiving 3 RCA stents as noted -troponin peaked >65, now downtrending -she continues to required transvenous pacing, will continue to monitor. If extubated, will recheck this afternoon  to see if her HR improves post extubation -remains hypotensive, requiring levophed -echo pending -appreciate PCCM assistance with vent and pressor management -will continue to monitor liver injury, secondary to hypoperfusion. Holding statin initiation given shock liver -acute kidney injury improving, making urine at >0.5 ml/kg/hr -continue aspirin and ticagrelor, aggrastat complete -will need cardiac rehab eval when ambulatory  She was very functional and healthy prior to this. I suspect this infarct had started at least several hours prior to presentation, possibly with intermittent episodes prior appearing as GI symptoms, given the lactate, troponin, liver enzymes, and Creatinine on presentation.   As she recovers from acute episode, we will see what her cardiac  function is and if she will require permanent pacing.  CRITICAL CARE Patient is critically ill with multiple organ systems affected and requires high complexity decision making. Total critical care time: 50 minutes. This time includes gathering of history, evaluation of patient's response to treatment, examination of patient, review of laboratory and imaging studies, and coordination with consultants.   For questions or updates, please contact [place] Please consult [email or web address] for contact info under     Signed, Buford Dresser, MD  02/19/2018, 9:24 AM

## 2018-02-19 NOTE — Evaluation (Signed)
Clinical/Bedside Swallow Evaluation Patient Details  Name: Charlene Dickerson MRN: [id number] Date of Birth: 03-07-1932  Today's Date: 02/19/2018 Time: SLP Start Time (ACUTE ONLY): 1440 SLP Stop Time (ACUTE ONLY): 1551 SLP Time Calculation (min) (ACUTE ONLY): 71 min  Past Medical History:  Past Medical History:  Diagnosis Date  . Diverticulosis of colon   . Essential tremor   . GERD (gastroesophageal reflux disease)    occasional (no meds)  . History of basal cell carcinoma excision 2012  . Hypertension   . Osteoporosis   . PONV (postoperative nausea and vomiting)   . Rectocele    symptomatic   Past Surgical History:  Past Surgical History:  Procedure Laterality Date  . BREAST EXCISIONAL BIOPSY Left 1995  . BREAST EXCISIONAL BIOPSY Right 1988  . CATARACT EXTRACTION W/ INTRAOCULAR LENS  IMPLANT, BILATERAL  right 09/ 2013;  left 10/ 2013  . COLONOSCOPY  last one 04/10/ 2012  . CORONARY STENT INTERVENTION N/A 02/18/2018   Procedure: CORONARY STENT INTERVENTION;  Surgeon: Runell Gess, MD;  Location: [facility] INVASIVE [facility];  Service: Cardiovascular;  Laterality: N/A;  . D & C HYSTEROSCOPY W/ RESECTION ENDOMETRIAL POLYP AND LESION  07-27-2007   dr Ambrose Mantle @[facility]   . LEFT HEART CATH AND CORONARY ANGIOGRAPHY N/A 02/18/2018   Procedure: LEFT HEART CATH AND CORONARY ANGIOGRAPHY;  Surgeon: Runell Gess, MD;  Location: [facility];  Service: Cardiovascular;  Laterality: N/A;  . RECTOCELE REPAIR N/A 11/26/2017   Procedure: POSTERIOR REPAIR (RECTOCELE);  Surgeon: Tracey Harries, MD;  Location: [facility];  Service: Gynecology;  Laterality: N/A;  OUTPT IN BED  . TEMPORARY PACEMAKER N/A 02/18/2018   Procedure: TEMPORARY PACEMAKER;  Surgeon: Runell Gess, MD;  Location: [facility] INVASIVE CV LAB;  Service: Cardiovascular;  Laterality: N/A;  . TUBAL LIGATION Bilateral yrs ago   HPI:  Pt suffered an IW STEMI with cardiogenic shock, requiring vasopressors and intubation,  extubated next day.    Assessment / Plan / Recommendation Clinical Impression  Pt demonstrates signs of a very mild post extubation dysphagia. Vocal quality is mildly dysphonic. 3 oz water test resulted in a slight throat clear, no cough. Pt grimaces and reports mild pain with swallow. She habitually takes small careful sips. Pt may advance diet as tolerated with close RN supervision. No f/u warranted, reorder SLP if pt has poor diet tolerance.  SLP Visit Diagnosis: Dysphagia, oropharyngeal phase (R13.12)    Aspiration Risk  Mild aspiration risk    Diet Recommendation Regular;Thin liquid   Liquid Administration via: Cup;Straw Medication Administration: Whole meds with liquid Supervision: Patient able to self feed    Other  Recommendations     Follow up Recommendations None      Frequency and Duration            Prognosis        Swallow Study   General HPI: Pt suffered an IW STEMI with cardiogenic shock, requiring vasopressors and intubation, extubated next day.  Type of Study: Bedside Swallow Evaluation Diet Prior to this Study: Regular;Thin liquids Respiratory Status: Nasal cannula History of Recent Intubation: Yes Length of Intubations (days): 1 days Date extubated: 02/19/18 Behavior/Cognition: Alert;Cooperative;Pleasant mood Oral Cavity Assessment: Within Functional Limits Oral Care Completed by SLP: No Oral Cavity - Dentition: Adequate natural dentition Self-Feeding Abilities: Able to feed self Baseline Vocal Quality: Hoarse    Oral/Motor/Sensory Function Overall Oral Motor/Sensory Function: Within functional limits   Ice Chips     Thin Liquid Thin  Liquid: Impaired Presentation: Cup;Straw Pharyngeal  Phase Impairments: Throat Clearing - Immediate    Nectar Thick Nectar Thick Liquid: Not tested   Honey Thick Honey Thick Liquid: Not tested   Puree Puree: Within functional limits   Solid     Solid: Not tested     [name], MA CCC-SLP  Acute  Rehabilitation Services Pager [phone] Office [phone]  [name], [name] 02/19/2018,3:01 PM

## 2018-02-20 ENCOUNTER — Encounter (HOSPITAL_COMMUNITY): Payer: Self-pay

## 2018-02-20 ENCOUNTER — Inpatient Hospital Stay (HOSPITAL_COMMUNITY): Payer: Medicare Other

## 2018-02-20 ENCOUNTER — Other Ambulatory Visit: Payer: Self-pay

## 2018-02-20 ENCOUNTER — Inpatient Hospital Stay: Payer: Self-pay

## 2018-02-20 DIAGNOSIS — R0989 Other specified symptoms and signs involving the circulatory and respiratory systems: Secondary | ICD-10-CM

## 2018-02-20 DIAGNOSIS — Z789 Other specified health status: Secondary | ICD-10-CM

## 2018-02-20 LAB — COMPREHENSIVE METABOLIC PANEL
ALT: 1524 U/L — ABNORMAL HIGH (ref 0–44)
AST: 814 U/L — ABNORMAL HIGH (ref 15–41)
Albumin: 2.9 g/dL — ABNORMAL LOW (ref 3.5–5.0)
Alkaline Phosphatase: 33 U/L — ABNORMAL LOW (ref 38–126)
Anion gap: 15 (ref 5–15)
BUN: 57 mg/dL — ABNORMAL HIGH (ref 8–23)
CO2: 20 mmol/L — ABNORMAL LOW (ref 22–32)
Calcium: 8.4 mg/dL — ABNORMAL LOW (ref 8.9–10.3)
Chloride: 99 mmol/L (ref 98–111)
Creatinine, Ser: 1.82 mg/dL — ABNORMAL HIGH (ref 0.44–1.00)
GFR calc Af Amer: 29 mL/min — ABNORMAL LOW (ref >60)
GFR calc non Af Amer: 25 mL/min — ABNORMAL LOW (ref >60)
Glucose, Bld: 143 mg/dL — ABNORMAL HIGH (ref 70–99)
POTASSIUM: 3.5 mmol/L (ref 3.5–5.1)
Sodium: 134 mmol/L — ABNORMAL LOW (ref 135–145)
Total Bilirubin: 2.3 mg/dL — ABNORMAL HIGH (ref 0.3–1.2)
Total Protein: 5.6 g/dL — ABNORMAL LOW (ref 6.5–8.1)

## 2018-02-20 LAB — CBC
HCT: 33.8 % — ABNORMAL LOW (ref 36.0–46.0)
Hemoglobin: 11.6 g/dL — ABNORMAL LOW (ref 12.0–15.0)
MCH: 33.6 pg (ref 26.0–34.0)
MCHC: 34.3 g/dL (ref 30.0–36.0)
MCV: 98 fL (ref 80.0–100.0)
Platelets: 115 10*3/uL — ABNORMAL LOW (ref 150–400)
RBC: 3.45 MIL/uL — ABNORMAL LOW (ref 3.87–5.11)
RDW: 12.9 % (ref 11.5–15.5)
WBC: 11.3 10*3/uL — ABNORMAL HIGH (ref 4.0–10.5)
nRBC: 0.2 % (ref 0.0–0.2)

## 2018-02-20 LAB — LACTIC ACID, PLASMA
Lactic Acid, Venous: 2.3 mmol/L (ref 0.5–1.9)
Lactic Acid, Venous: 1.8 mmol/L (ref 0.5–1.9)

## 2018-02-20 MED ORDER — LATANOPROST 0.005 % OP SOLN
1.0000 [drp] | Freq: Every day | OPHTHALMIC | Status: DC
  Administered 2018-02-20 – 2018-02-24 (×4): 1 [drp] via OPHTHALMIC
  Filled 2018-02-20 (×2): qty 2.5

## 2018-02-20 MED ORDER — NAPHAZOLINE-GLYCERIN 0.012-0.2 % OP SOLN
1.0000 [drp] | Freq: Four times a day (QID) | OPHTHALMIC | Status: DC | PRN
  Administered 2018-02-20: 1 [drp] via OPHTHALMIC
  Filled 2018-02-20: qty 15

## 2018-02-20 MED ORDER — BOOST / RESOURCE BREEZE PO LIQD CUSTOM
1.0000 | Freq: Three times a day (TID) | ORAL | Status: DC
  Administered 2018-02-21 – 2018-02-24 (×9): 1 via ORAL

## 2018-02-20 NOTE — Progress Notes (Signed)
Progress Note  Patient Name: Charlene Dickerson Date of Encounter: 02/20/2018  Primary Cardiologist: Jodelle Red, MD   Subjective   Denies CP or dyspnea  Inpatient Medications    Scheduled Meds: . aspirin  81 mg Oral Daily  . enoxaparin (LOVENOX) injection  30 mg Subcutaneous Q24H  . pantoprazole sodium  40 mg Per Tube QHS  . sodium chloride flush  3 mL Intravenous Q12H  . ticagrelor  90 mg Oral BID   Continuous Infusions: . sodium chloride 10 mL/hr at 02/20/18 0600  . sodium chloride    . norepinephrine (LEVOPHED) Adult infusion 11 mcg/min (02/20/18 0743)   PRN Meds: sodium chloride, acetaminophen, ondansetron (ZOFRAN) IV, sodium chloride flush   Vital Signs    Vitals:   02/20/18 0730 02/20/18 0745 02/20/18 0800 02/20/18 0810  BP: (!) 87/61 99/64 101/65   Pulse: 88 89 89   Resp: 19 16 14    Temp:    98 F (36.7 C)  TempSrc:    Oral  SpO2: 98% 100% 100%   Weight:      Height:        Intake/Output Summary (Last 24 hours) at 02/20/2018 0901 Last data filed at 02/20/2018 0600 Gross per 24 hour  Intake 1393.14 ml  Output 3100 ml  Net -1706.86 ml   Filed Weights   02/18/18 1500  Weight: 68 kg    Telemetry    Vpaced; possible underlying atrial fibrillation- Personally Reviewed   Physical Exam   GEN: No acute distress.   Neck: No JVD Cardiac: irregular Respiratory: Clear to auscultation bilaterally. GI: Soft, nontender, non-distended  MS: No edema; Right groin with pacing wire in place Neuro:  Nonfocal  Psych: Normal affect   Labs    Chemistry Recent Labs  Lab 02/18/18 1405 02/18/18 1411 02/19/18 0516 02/20/18 0329  NA 132* 130* 135 134*  K 5.1 5.0 4.8 3.5  CL 96* 100 102 99  CO2 16*  --  15* 20*  GLUCOSE 103* 100* 102* 143*  BUN 66* 64* 73* 57*  CREATININE 3.80* 4.00* 2.94* 1.82*  CALCIUM 9.9  --  8.9 8.4*  PROT 6.2*  --   --  5.6*  ALBUMIN 3.7  --   --  2.9*  AST 970*  --   --  814*  ALT 972*  --   --  1,524*  ALKPHOS 26*   --   --  33*  BILITOT 2.5*  --   --  2.3*  GFRNONAA 10*  --  14* 25*  GFRAA 12*  --  16* 29*  ANIONGAP 20*  --  18* 15     Hematology Recent Labs  Lab 02/18/18 1840 02/19/18 0516 02/20/18 0329  WBC 10.6* 16.9* 11.3*  RBC 3.28* 3.63* 3.45*  HGB 11.1* 12.1 11.6*  HCT 32.9* 37.4 33.8*  MCV 100.3* 103.0* 98.0  MCH 33.8 33.3 33.6  MCHC 33.7 32.4 34.3  RDW 13.1 13.1 12.9  PLT 99* 115* 115*    Cardiac Enzymes Recent Labs  Lab 02/18/18 1405 02/18/18 1840 02/18/18 2245 02/19/18 0516  TROPONINI 39.64* >65.00* >65.00* 57.84*    Recent Labs  Lab 02/18/18 1410  TROPIPOC 23.56*      Radiology    Dg Chest Port 1 View  Result Date: 02/19/2018 CLINICAL DATA:  Status post intravenous line placement. EXAM: PORTABLE CHEST 1 VIEW COMPARISON:  Portable chest x-ray of February 18, 2018 FINDINGS: No PICC line or internal jugular or subclavian catheter is observed. There is  a tubular structure that appears new that may reflect a femoral venous catheter. Its tip appears to overlie the electrode connection for 1 of the external pacemaker defibrillator pads. This may be in the right atrium. The trachea and esophagus have been extubated. There is a small left pleural effusion. The cardiac silhouette is top-normal in size. The pulmonary vascularity is not engorged. The bony thorax exhibits no acute abnormality. IMPRESSION: Patient may have undergone placement of a femoral venous catheter. The tip is difficult to localized precisely but it may lie in the right ventricle. Correlation with the type of venous catheter placed and the desire target location of its tip is the. Small left pleural effusion. Stable cardiomegaly without pulmonary vascular congestion. Electronically Signed   By: David  SwazilandJordan M.D.   On: 02/19/2018 14:37   Dg Chest Portable 1 View  Result Date: 02/18/2018 CLINICAL DATA:  84 y/o  F; status post intubation and STEMI. EXAM: PORTABLE CHEST 1 VIEW COMPARISON:  11/26/2007 chest  radiograph. FINDINGS: Stable cardiac silhouette given projection and technique. Transcutaneous pacing pads noted. Endotracheal tube tip is just above the carina, the area is obscured by electronic device. Enteric tube tip projects over the proximal stomach. Pulmonary vascular congestion. No consolidation, effusion, or pneumothorax. No acute osseous abnormality is evident. IMPRESSION: Endotracheal tube tip just above the carina, the area is obscured by electronic device, consider repeat radiographs. Enteric tube tip projects over proximal stomach. Pulmonary vascular congestion. Electronically Signed   By: Mitzi HansenLance  Furusawa-Stratton M.D.   On: 02/18/2018 14:40    Patient Profile     84 y.o. female with a history of hypertension but no known cardiac history who [facility] after being found by a neighbor with acute onset of altered mental status. She was found to have an inferior STEMI with complete heart block.  Cardiac catheterization December 19 showed an 80% proximal left circumflex, 50% proximal LAD, 99% proximal right coronary artery followed by 100% mid.  Patient had PCI of the RCA.  Echocardiogram shows ejection fraction 50 to 55% with inferobasal wall motion abnormality.  There is mild aortic insufficiency, right side enlargement and severe RV dysfunction.  Assessment & Plan    1 status post inferior infarct-complicated by complete heart block and RV infarct.  Patient slowly improving.  Continue aspirin and Brilinta.  Will add statin as liver functions improved.  No beta-blocker at present given recent complete heart block and hypotension.  2 complete heart block-pacemaker rate decreased to 50.  Patient now has a heart rate of 60 but may be in atrial fibrillation.  Will check electrocardiogram.  This should improve with time.  Backup rate will be left at 50.  3 hypotension-this is likely secondary to RV infarct.  Wean pressors as tolerated.  We will gently hydrate.  4  acute renal insufficiency-renal function is improving.  Will hydrate and follow.  5 elevated liver functions-likely secondary to shock liver.  Continue to follow.  For questions or updates, please contact [facility] Please consult [email or web address] for contact info under        Signed, Olga MillersBrian Lanesha Azzaro, MD  02/20/2018, 9:01 AM

## 2018-02-20 NOTE — Progress Notes (Signed)
Paged on call MD as pt looks like she is in a slow atrial flutter and despite significant efforts nursing has not been able to wean the levophed down. MD verbal order for lactic acid stat, and to call E-link for central line placement so that we don't have to run levophed peripherally. EKG done and MD notified. Lactic acid drawn and sent. MD at bedside to have central line placed. Will continue to monitor closely.  Delories Heinz, RN

## 2018-02-20 NOTE — Progress Notes (Signed)
Attempted to place PICC in RA basilic vessel.  Able to pass guidewire easily, but unable to thread PICC. LUA assessed, will attempt PICC placement in LA on 02/21/18.  Pt and dtr agreeable and aware of the plan.  RN notified.

## 2018-02-20 NOTE — Procedures (Signed)
Central Venous Catheter Insertion Procedure Note AMORA RUBLEY [id number] 1932-04-14  Procedure: Insertion of Central Venous Catheter Indications: Assessment of intravascular volume, Drug and/or fluid administration, Frequent blood sampling and vasoactive infusions  Procedure Details Consent: Risks of procedure as well as the alternatives and risks of each were explained to the (patient/caregiver).  Consent for procedure obtained. Time Out: Verified patient identification, verified procedure, site/side was marked, verified correct patient position, special equipment/implants available, medications/allergies/relevent history reviewed, required imaging and test results available.  Performed  Maximum sterile technique was used including antiseptics, cap, gloves, gown, hand hygiene, mask and sheet. Skin prep: Chlorhexidine; local anesthetic administered Pt placed in trendelenburg for procedure  A antimicrobial bonded/coated triple lumen catheter was placed in the right internal jugular vein using the Seldinger technique.  Evaluation Blood flow good Complications: No apparent complications Patient did tolerate procedure well. Chest X-ray ordered to verify placement.  CXR: pending.  Gypsy Balsam Scatliffe 02/20/2018, 9:15 PM

## 2018-02-20 NOTE — Progress Notes (Signed)
PT Cancellation Note  Patient Details Name: Charlene Dickerson MRN: [id number] DOB: [birth year]   Cancelled Treatment:    Reason Eval/Treat Not Completed: Medical issues which prohibited therapy Awaiting removal of groin pacer for mobility assessment.  Laurina Bustle, PT, DPT Acute Rehabilitation Services Pager [phone] Office [phone]    Vanetta Mulders 02/20/2018, 1:30 PM

## 2018-02-20 NOTE — Progress Notes (Addendum)
NAME:  Charlene Dickerson, MRN:  [id number], DOB:  1933-01-26, LOS: 2 ADMISSION DATE:  02/18/2018, CONSULTATION DATE:  02/18/2018 REFERRING MD:  Erlene Quan - [facility], CHIEF COMPLAINT:  Respiratory failure.   HPI/course in hospital  84 year old woman referred for ventilator management. She suffered an IW STEMI with cardiogenic shock, requiring vasopressors and intubation. She had two Synergy DES deployed into the proximal RCA, placement of a temporary pacing wire. She was maintained on Aggrastat infusion to control luminal thrombus.   Past Medical History  She,  has a past medical history of Diverticulosis of colon, Essential tremor, GERD (gastroesophageal reflux disease), History of basal cell carcinoma excision (2012), Hypertension, Osteoporosis, PONV (postoperative nausea and vomiting), and Rectocele.  Past Surgical History:  Procedure Laterality Date  . BREAST EXCISIONAL BIOPSY Left 1995  . BREAST EXCISIONAL BIOPSY Right 1988  . CATARACT EXTRACTION W/ INTRAOCULAR LENS  IMPLANT, BILATERAL  right 09/ 2013;  left 10/ 2013  . COLONOSCOPY  last one 04/10/ 2012  . CORONARY STENT INTERVENTION N/A 02/18/2018   Procedure: CORONARY STENT INTERVENTION;  Surgeon: Runell Gess, MD;  Location: [facility] INVASIVE CV LAB;  Service: Cardiovascular;  Laterality: N/A;  . D & C HYSTEROSCOPY W/ RESECTION ENDOMETRIAL POLYP AND LESION  07-27-2007   dr Ambrose Mantle @[facility]   . LEFT HEART CATH AND CORONARY ANGIOGRAPHY N/A 02/18/2018   Procedure: LEFT HEART CATH AND CORONARY ANGIOGRAPHY;  Surgeon: Runell Gess, MD;  Location: [facility] INVASIVE CV LAB;  Service: Cardiovascular;  Laterality: N/A;  . RECTOCELE REPAIR N/A 11/26/2017   Procedure: POSTERIOR REPAIR (RECTOCELE);  Surgeon: Tracey Harries, MD;  Location: [facility];  Service: Gynecology;  Laterality: N/A;  OUTPT IN BED  . TEMPORARY PACEMAKER N/A 02/18/2018   Procedure: TEMPORARY PACEMAKER;  Surgeon: Runell Gess, MD;  Location: [facility] INVASIVE CV LAB;   Service: Cardiovascular;  Laterality: N/A;  . TUBAL LIGATION Bilateral yrs ago      Interim history/subjective:  Bleeding from cath site controlled with overstitch and thrombi-pad. Tolerating SBT this morning.  Objective   Blood pressure (!) 85/53, pulse (!) 56, temperature 98.1 F (36.7 C), temperature source Oral, resp. rate (!) 24, height 5\' 3"  (1.6 m), weight 68 kg, SpO2 100 %.        Intake/Output Summary (Last 24 hours) at 02/20/2018 1419 Last data filed at 02/20/2018 1300 Gross per 24 hour  Intake 1797.02 ml  Output 2625 ml  Net -827.98 ml   Filed Weights   02/18/18 1500  Weight: 68 kg       Ancillary tests (personally reviewed)  CBC: Recent Labs  Lab 02/18/18 1405 02/18/18 1411 02/18/18 1840 02/19/18 0516 02/20/18 0329  WBC 12.6*  --  10.6* 16.9* 11.3*  NEUTROABS 9.8*  --   --   --   --   HGB 12.4 12.6 11.1* 12.1 11.6*  HCT 38.9 37.0 32.9* 37.4 33.8*  MCV 102.1*  --  100.3* 103.0* 98.0  PLT 102*  --  99* 115* 115*    Basic Metabolic Panel: Recent Labs  Lab 02/18/18 1405 02/18/18 1411 02/19/18 0516 02/20/18 0329  NA 132* 130* 135 134*  K 5.1 5.0 4.8 3.5  CL 96* 100 102 99  CO2 16*  --  15* 20*  GLUCOSE 103* 100* 102* 143*  BUN 66* 64* 73* 57*  CREATININE 3.80* 4.00* 2.94* 1.82*  CALCIUM 9.9  --  8.9 8.4*   GFR: Estimated Creatinine Clearance: 21.7 mL/min (A) (by C-G formula based  on SCr of 1.82 mg/dL (H)). Recent Labs  Lab 02/18/18 1405 02/18/18 1412 02/18/18 1840 02/19/18 0516 02/20/18 0329  WBC 12.6*  --  10.6* 16.9* 11.3*  LATICACIDVEN  --  5.37*  --   --   --     Liver Function Tests: Recent Labs  Lab 02/18/18 1405 02/20/18 0329  AST 970* 814*  ALT 972* 1,524*  ALKPHOS 26* 33*  BILITOT 2.5* 2.3*  PROT 6.2* 5.6*  ALBUMIN 3.7 2.9*   No results for input(s): LIPASE, AMYLASE in the last 168 hours. No results for input(s): AMMONIA in the last 168 hours.  ABG    Component Value Date/Time   PHART 7.356 02/18/2018 1828    PCO2ART 34.1 02/18/2018 1828   PO2ART 391.0 (H) 02/18/2018 1828   HCO3 19.3 (L) 02/18/2018 1828   TCO2 20 (L) 02/18/2018 1828   ACIDBASEDEF 6.0 (H) 02/18/2018 1828   O2SAT 100.0 02/18/2018 1828     Coagulation Profile: Recent Labs  Lab 02/18/18 1405  INR 1.63    Cardiac Enzymes: Recent Labs  Lab 02/18/18 1405 02/18/18 1840 02/18/18 2245 02/19/18 0516  TROPONINI 39.64* >65.00* >65.00* 57.84*    HbA1C: No results found for: HGBA1C  CBG: Recent Labs  Lab 02/18/18 1852  GLUCAP 97   Echocardiogram (personally reviewed): Inferior LV WMA, but global function normal. RV at least. moderately enlarged and moderately impaired function. Elevated left-sided filling pressures by Doppler.  Assessment & Plan:   Critically ill due to acute respiratory failure requiring mechanical ventilation; resolved. Cardiogenic/vasodilatory shock requiring titration of Levophed. S/P STEMI with evidence of RV involvement. AKI  Plan: On nasal canula o2. Maintain spo2 > 92% RV dysfunction persists; receiving iv fluids per cardiology's recommendation and peripheral levophed; will assess for PICC insertion due to the unknown duration of levophed administration. Follow renal function and monitor volume status. Continue to pace at rate of 90 to improve CO especially in face of RV dysfunction.  Best practice:  Diet: PO  Pain/Anxiety/Delirium protocol (if indicated): fentanyl and versed. Titrate to RASS 0. At high risk of accumulation given age and renal function. VAP protocol (if indicated): bundle in place. DVT prophylaxis: On Aggrestat. GI prophylaxis: Famotidine Glucose control: Phase I Mobility: Bed rest, Phase I Cardiac rehabilitation once extubated, when cleared by cardiology. Code Status: Full. Family Communication: Updated patient at bedside. Disposition:  ICU   Critical care time: 35 min including assessment of patient, review of echo and labs, and supervision and adjustment of  mechanical ventilation.    Jannet AskewMichael Davelle Anselmi, MD [facility]  [phone].  02/20/2018, 2:19 PM

## 2018-02-21 ENCOUNTER — Encounter (HOSPITAL_COMMUNITY): Payer: Self-pay

## 2018-02-21 LAB — BASIC METABOLIC PANEL
Anion gap: 12 (ref 5–15)
BUN: 32 mg/dL — ABNORMAL HIGH (ref 8–23)
CHLORIDE: 100 mmol/L (ref 98–111)
CO2: 24 mmol/L (ref 22–32)
Calcium: 8.4 mg/dL — ABNORMAL LOW (ref 8.9–10.3)
Creatinine, Ser: 1.17 mg/dL — ABNORMAL HIGH (ref 0.44–1.00)
GFR calc Af Amer: 50 mL/min — ABNORMAL LOW (ref >60)
GFR calc non Af Amer: 43 mL/min — ABNORMAL LOW (ref >60)
Glucose, Bld: 157 mg/dL — ABNORMAL HIGH (ref 70–99)
Potassium: 3.4 mmol/L — ABNORMAL LOW (ref 3.5–5.1)
Sodium: 136 mmol/L (ref 135–145)

## 2018-02-21 LAB — HEPARIN LEVEL (UNFRACTIONATED): Heparin Unfractionated: 0.39 IU/mL (ref 0.30–0.70)

## 2018-02-21 LAB — CBC
HCT: 33.8 % — ABNORMAL LOW (ref 36.0–46.0)
Hemoglobin: 11.1 g/dL — ABNORMAL LOW (ref 12.0–15.0)
MCH: 32.6 pg (ref 26.0–34.0)
MCHC: 32.8 g/dL (ref 30.0–36.0)
MCV: 99.1 fL (ref 80.0–100.0)
NRBC: 0.3 % — AB (ref 0.0–0.2)
Platelets: 124 10*3/uL — ABNORMAL LOW (ref 150–400)
RBC: 3.41 MIL/uL — AB (ref 3.87–5.11)
RDW: 12.8 % (ref 11.5–15.5)
WBC: 10.5 10*3/uL (ref 4.0–10.5)

## 2018-02-21 MED ORDER — NOREPINEPHRINE 16 MG/250ML-% IV SOLN
0.0000 ug/min | INTRAVENOUS | Status: DC
  Administered 2018-02-21: 12 ug/min via INTRAVENOUS
  Administered 2018-02-21: 14 ug/min via INTRAVENOUS
  Administered 2018-02-22: 9 ug/min via INTRAVENOUS
  Filled 2018-02-21 (×2): qty 250

## 2018-02-21 MED ORDER — IBUPROFEN 200 MG PO TABS
400.0000 mg | ORAL_TABLET | Freq: Once | ORAL | Status: AC
  Administered 2018-02-21: 400 mg via ORAL
  Filled 2018-02-21: qty 2

## 2018-02-21 MED ORDER — ATORVASTATIN CALCIUM 40 MG PO TABS
40.0000 mg | ORAL_TABLET | Freq: Every day | ORAL | Status: DC
  Administered 2018-02-21 – 2018-02-23 (×3): 40 mg via ORAL
  Filled 2018-02-21 (×3): qty 1

## 2018-02-21 MED ORDER — DOCUSATE SODIUM 100 MG PO CAPS
100.0000 mg | ORAL_CAPSULE | Freq: Every day | ORAL | Status: DC
  Administered 2018-02-21 – 2018-02-23 (×3): 100 mg via ORAL
  Filled 2018-02-21 (×4): qty 1

## 2018-02-21 MED ORDER — POTASSIUM CHLORIDE CRYS ER 20 MEQ PO TBCR
60.0000 meq | EXTENDED_RELEASE_TABLET | Freq: Once | ORAL | Status: AC
  Administered 2018-02-21: 60 meq via ORAL
  Filled 2018-02-21: qty 3

## 2018-02-21 MED ORDER — SODIUM CHLORIDE 0.9 % IV BOLUS
250.0000 mL | Freq: Once | INTRAVENOUS | Status: AC
  Administered 2018-02-21: 250 mL via INTRAVENOUS

## 2018-02-21 MED ORDER — HEPARIN (PORCINE) 25000 UT/250ML-% IV SOLN
950.0000 [IU]/h | INTRAVENOUS | Status: DC
  Administered 2018-02-21: 950 [IU]/h via INTRAVENOUS
  Filled 2018-02-21 (×2): qty 250

## 2018-02-21 MED ORDER — POLYETHYLENE GLYCOL 3350 17 G PO PACK: 17.0000 g | PACK | Freq: Every day | ORAL | Status: DC | PRN

## 2018-02-21 NOTE — Plan of Care (Signed)
  Problem: Education: Goal: Understanding of CV disease, CV risk reduction, and recovery process will improve Outcome: Progressing   Problem: Activity: Goal: Ability to return to baseline activity level will improve Outcome: Progressing Note:  Pt now up to and from [facility] with assistance    Problem: Cardiovascular: Goal: Ability to achieve and maintain adequate cardiovascular perfusion will improve Outcome: Progressing Goal: Vascular access site(s) Level 0-1 will be maintained Outcome: Progressing   Problem: Education: Goal: Understanding of medication regimen will improve Outcome: Progressing   Problem: Activity: Goal: Ability to tolerate increased activity will improve Outcome: Progressing   Problem: Education: Goal: Knowledge of General Education information will improve Description Including pain rating scale, medication(s)/side effects and non-pharmacologic comfort measures Outcome: Progressing   Problem: Health Behavior/Discharge Planning: Goal: Ability to manage health-related needs will improve Outcome: Progressing   Problem: Clinical Measurements: Goal: Ability to maintain clinical measurements within normal limits will improve Outcome: Progressing Goal: Will remain free from infection Outcome: Progressing Goal: Diagnostic test results will improve Outcome: Progressing Goal: Respiratory complications will improve Outcome: Progressing Goal: Cardiovascular complication will be avoided Outcome: Progressing   Problem: Nutrition: Goal: Adequate nutrition will be maintained Outcome: Progressing   Problem: Coping: Goal: Level of anxiety will decrease Outcome: Progressing   Problem: Elimination: Goal: Will not experience complications related to bowel motility Outcome: Progressing Note:  Started on Colace first dose scheduled for 2100  Goal: Will not experience complications related to urinary retention Outcome: Progressing   Problem: Pain  Managment: Goal: General experience of comfort will improve Outcome: Progressing   Problem: Safety: Goal: Ability to remain free from injury will improve Outcome: Progressing   Problem: Skin Integrity: Goal: Risk for impaired skin integrity will decrease Outcome: Progressing

## 2018-02-21 NOTE — Progress Notes (Addendum)
Progress Note  Patient Name: Charlene Dickerson Date of Encounter: 02/21/2018  Primary Cardiologist: Jodelle Red, MD   Subjective   No CP or dyspnea  Inpatient Medications    Scheduled Meds: . aspirin  81 mg Oral Daily  . enoxaparin (LOVENOX) injection  30 mg Subcutaneous Q24H  . feeding supplement  1 Container Oral TID BM  . latanoprost  1 drop Both Eyes QHS  . pantoprazole sodium  40 mg Per Tube QHS  . sodium chloride flush  3 mL Intravenous Q12H  . ticagrelor  90 mg Oral BID   Continuous Infusions: . sodium chloride    . norepinephrine (LEVOPHED) Adult infusion 14 mcg/min (02/21/18 0834)   PRN Meds: sodium chloride, acetaminophen, naphazoline-glycerin, ondansetron (ZOFRAN) IV, sodium chloride flush   Vital Signs    Vitals:   02/21/18 0815 02/21/18 0821 02/21/18 0830 02/21/18 0841  BP: (!) 65/50 114/67 116/70   Pulse: 87 71 96 82  Resp: (!) 22 (!) 23 (!) 22 (!) 21  Temp:      TempSrc:      SpO2: 97% 98% 97% 96%  Weight:      Height:        Intake/Output Summary (Last 24 hours) at 02/21/2018 0846 Last data filed at 02/21/2018 0800 Gross per 24 hour  Intake 2787.12 ml  Output 1425 ml  Net 1362.12 ml   Filed Weights   02/18/18 1500  Weight: 68 kg    Telemetry    Atrial fibrillation- Personally Reviewed   Physical Exam   GEN: No acute distress.  WD WN Neck: supple Cardiac: irregular, no murmur Respiratory: CTA GI: Soft, NT/ND, right groin with no hematoma MS: trace edema Neuro:  grossly intact  Labs    Chemistry Recent Labs  Lab 02/18/18 1405  02/19/18 0516 02/20/18 0329 02/21/18 0440  NA 132*   < > 135 134* 136  K 5.1   < > 4.8 3.5 3.4*  CL 96*   < > 102 99 100  CO2 16*  --  15* 20* 24  GLUCOSE 103*   < > 102* 143* 157*  BUN 66*   < > 73* 57* 32*  CREATININE 3.80*   < > 2.94* 1.82* 1.17*  CALCIUM 9.9  --  8.9 8.4* 8.4*  PROT 6.2*  --   --  5.6*  --   ALBUMIN 3.7  --   --  2.9*  --   AST 970*  --   --  814*  --   ALT  972*  --   --  1,524*  --   ALKPHOS 26*  --   --  33*  --   BILITOT 2.5*  --   --  2.3*  --   GFRNONAA 10*  --  14* 25* 43*  GFRAA 12*  --  16* 29* 50*  ANIONGAP 20*  --  18* 15 12   < > = values in this interval not displayed.     Hematology Recent Labs  Lab 02/19/18 0516 02/20/18 0329 02/21/18 0440  WBC 16.9* 11.3* 10.5  RBC 3.63* 3.45* 3.41*  HGB 12.1 11.6* 11.1*  HCT 37.4 33.8* 33.8*  MCV 103.0* 98.0 99.1  MCH 33.3 33.6 32.6  MCHC 32.4 34.3 32.8  RDW 13.1 12.9 12.8  PLT 115* 115* 124*    Cardiac Enzymes Recent Labs  Lab 02/18/18 1405 02/18/18 1840 02/18/18 2245 02/19/18 0516  TROPONINI 39.64* >65.00* >65.00* 57.84*    Recent Labs  Lab  02/18/18 1410  TROPIPOC 23.56*      Radiology    Dg Chest Port 1 View  Result Date: 02/20/2018 CLINICAL DATA:  Central line placement EXAM: PORTABLE CHEST 1 VIEW COMPARISON:  One day prior FINDINGS: Right-sided femoral line terminating over the right ventricle. Placement of a right internal jugular line with tip at high right atrium. Numerous leads and wires project over the chest. Midline trachea. Mild cardiomegaly. Small left pleural effusion. No pneumothorax. Resolution of pulmonary venous congestion. Persistent mild left base airspace disease. IMPRESSION: 1. Placement of a right internal jugular line with tip at high right atrium. No pneumothorax. 2. Cardiomegaly with resolution of pulmonary venous congestion. 3. Persistent small left pleural effusion with adjacent atelectasis. Electronically Signed   By: Jeronimo Greaves M.D.   On: 02/20/2018 21:42   Dg Chest Port 1 View  Result Date: 02/19/2018 CLINICAL DATA:  Status post intravenous line placement. EXAM: PORTABLE CHEST 1 VIEW COMPARISON:  Portable chest x-ray of February 18, 2018 FINDINGS: No PICC line or internal jugular or subclavian catheter is observed. There is a tubular structure that appears new that may reflect a femoral venous catheter. Its tip appears to overlie the  electrode connection for 1 of the external pacemaker defibrillator pads. This may be in the right atrium. The trachea and esophagus have been extubated. There is a small left pleural effusion. The cardiac silhouette is top-normal in size. The pulmonary vascularity is not engorged. The bony thorax exhibits no acute abnormality. IMPRESSION: Patient may have undergone placement of a femoral venous catheter. The tip is difficult to localized precisely but it may lie in the right ventricle. Correlation with the type of venous catheter placed and the desire target location of its tip is the. Small left pleural effusion. Stable cardiomegaly without pulmonary vascular congestion. Electronically Signed   By: David  [place] M.D.   On: 02/19/2018 14:37   [place] Ekg Site Rite  Result Date: 02/20/2018 If Site Rite image not attached, placement could not be confirmed due to current cardiac rhythm.   Patient Profile     84 y.o. female with a history of hypertension but no known cardiac history who presented to the [facility] ED via EMS after being found by a neighbor with acute onset of altered mental status. She was found to have an inferior STEMI with complete heart block.  Cardiac catheterization December 19 showed an 80% proximal left circumflex, 50% proximal LAD, 99% proximal right coronary artery followed by 100% mid.  Patient had PCI of the RCA.  Echocardiogram shows ejection fraction 50 to 55% with inferobasal wall motion abnormality.  There is mild aortic insufficiency, right side enlargement and severe RV dysfunction.  Assessment & Plan    1 status post inferior infarct-course was complicated by complete heart block requiring temporary pacing and also RV involvement.  Patient continues to improve.  Heart rate much better today.  Will discontinue temporary pacemaker.  Blood pressure also improving.  Continue to wean pressors as tolerated.  Continue aspirin and Brilinta.  Add Lipitor 40 mg daily.  We will continue  to avoid beta-blocker for now until heart rate better.    2 complete heart block-patient now in atrial fibrillation with heart rate in the 80s.  Discontinue temporary pacemaker and follow.  3 hypotension-felt secondary to RV involvement.  Slowly improving.  Wean pressors as tolerated.  4 acute renal insufficiency-renal function continues to improve.  We will continue to follow.  5 elevated liver functions-likely secondary to shock  liver.  Recheck in AM  6 atrial fibrillation-patient now in atrial fibrillation.  We will begin IV heparin following pacemaker removal and will need transition to apixaban.  Planned cardioversion 4 weeks following discharge if atrial fibrillation persists.  Given need for apixaban will change Brilinta to Plavix at discharge.  7 hypokalemia-supplement  For questions or updates, please contact [facility] Please consult [email or web address] for contact info under        Signed, Olga MillersBrian Jaquarius Seder, MD  02/21/2018, 8:46 AM

## 2018-02-21 NOTE — Progress Notes (Signed)
TVP wires removed, minimal ectopy x 1 pvc noted. Sheath removed, pressure held x 20 mins. Gauze and tegaderm placed, no noted bleeding or hematoma. Dressing CDI. SBP remains stable, titrating levo

## 2018-02-21 NOTE — Progress Notes (Signed)
Called critical lab value Lactic acid 2.3 to cardiology on call. Also spoke with cards regarding plan for tonight for pt, plan to leave pt on pacer with a back up rate of 50. Orders to discontinue normal saline fluids running at 75 mls an hour. RN will continue to monitor pt closely.

## 2018-02-21 NOTE — Progress Notes (Signed)
ANTICOAGULATION CONSULT NOTE   Pharmacy Consult for heparin Indication: atrial fibrillation  Allergies  Allergen Reactions  . Clindamycin/Lincomycin Diarrhea    "c-diff"    Patient Measurements: Height: 5\' 3"  (160 cm) Weight: 149 lb 14.6 oz (68 kg) IBW/kg (Calculated) : 52.4 Heparin Dosing Weight: 68kg  Vital Signs: Temp: 98 F (36.7 C) (12/22 1558) Temp Source: Oral (12/22 1558) BP: 91/51 (12/22 2030) Pulse Rate: 62 (12/22 2030)  Labs: Recent Labs    02/18/18 2245  02/19/18 0516 02/20/18 0329 02/21/18 0440 02/21/18 1932  HGB  --    < > 12.1 11.6* 11.1*  --   HCT  --   --  37.4 33.8* 33.8*  --   PLT  --   --  115* 115* 124*  --   HEPARINUNFRC  --   --   --   --   --  0.39  CREATININE  --   --  2.94* 1.82* 1.17*  --   TROPONINI >65.00*  --  57.84*  --   --   --    < > = values in this interval not displayed.    Estimated Creatinine Clearance: 33.7 mL/min (A) (by C-G formula based on SCr of 1.17 mg/dL (H)).  Assessment: 84 year old female s/p MI with PCI to RCA last week. Pt started on heparin for new afib. Heparin level 0.39 (therapeutic) on gtt at 950 units/hr. No bleeding noted.  Hgb stable in 11s, plt count low but stable at 124.   Goal of Therapy:  Heparin level 0.3-0.7 units/ml Monitor platelets by anticoagulation protocol: Yes   Plan:  Continue heparin infusion at 950 units/hr F/u daily heparin level and CBC   Christoper Fabian, PharmD, BCPS Clinical pharmacist  **Pharmacist phone directory can now be found on [email or web address] ([email or web address]).  Listed under [facility]. 02/21/2018 8:36 PM

## 2018-02-21 NOTE — Plan of Care (Signed)
  Problem: Cardiovascular: Goal: Vascular access site(s) Level 0-1 will be maintained Outcome: Progressing   Problem: Activity: Goal: Ability to return to baseline activity level will improve Outcome: Not Progressing Note:  Pt on bed rest due to transvenous pacer

## 2018-02-21 NOTE — Progress Notes (Signed)
NAME:  Charlene Dickerson, MRN:  [id number], DOB:  05/24/1932, LOS: 3 ADMISSION DATE:  02/18/2018, CONSULTATION DATE:  02/18/2018 REFERRING MD:  Erlene Quan - [facility], CHIEF COMPLAINT:  Respiratory failure.   HPI/course in hospital  84 year old woman referred for ventilator management. She suffered an IW STEMI with cardiogenic shock, requiring vasopressors and intubation. She had two Synergy DES deployed into the proximal RCA, placement of a temporary pacing wire. She was maintained on Aggrastat infusion to control luminal thrombus.   Past Medical History  She,  has a past medical history of Diverticulosis of colon, Essential tremor, GERD (gastroesophageal reflux disease), History of basal cell carcinoma excision (2012), Hypertension, Osteoporosis, PONV (postoperative nausea and vomiting), and Rectocele.  Past Surgical History:  Procedure Laterality Date  . BREAST EXCISIONAL BIOPSY Left 1995  . BREAST EXCISIONAL BIOPSY Right 1988  . CATARACT EXTRACTION W/ INTRAOCULAR LENS  IMPLANT, BILATERAL  right 09/ 2013;  left 10/ 2013  . COLONOSCOPY  last one 04/10/ 2012  . CORONARY STENT INTERVENTION N/A 02/18/2018   Procedure: CORONARY STENT INTERVENTION;  Surgeon: Runell Gess, MD;  Location: [facility] INVASIVE CV LAB;  Service: Cardiovascular;  Laterality: N/A;  . D & C HYSTEROSCOPY W/ RESECTION ENDOMETRIAL POLYP AND LESION  07-27-2007   dr Ambrose Mantle @[facility]   . LEFT HEART CATH AND CORONARY ANGIOGRAPHY N/A 02/18/2018   Procedure: LEFT HEART CATH AND CORONARY ANGIOGRAPHY;  Surgeon: Runell Gess, MD;  Location: [facility] INVASIVE CV LAB;  Service: Cardiovascular;  Laterality: N/A;  . RECTOCELE REPAIR N/A 11/26/2017   Procedure: POSTERIOR REPAIR (RECTOCELE);  Surgeon: Tracey Harries, MD;  Location: [facility];  Service: Gynecology;  Laterality: N/A;  OUTPT IN BED  . TEMPORARY PACEMAKER N/A 02/18/2018   Procedure: TEMPORARY PACEMAKER;  Surgeon: Runell Gess, MD;  Location: [facility] INVASIVE CV LAB;   Service: Cardiovascular;  Laterality: N/A;  . TUBAL LIGATION Bilateral yrs ago      Interim history/subjective:  Bleeding from cath site controlled with overstitch and thrombi-pad. Tolerating SBT this morning.  Objective   Blood pressure (!) 119/96, pulse 77, temperature 98 F (36.7 C), temperature source Oral, resp. rate (!) 22, height 5\' 3"  (1.6 m), weight 68 kg, SpO2 100 %. CVP:  [9 mmHg-25 mmHg] 18 mmHg      Intake/Output Summary (Last 24 hours) at 02/21/2018 1754 Last data filed at 02/21/2018 1652 Gross per 24 hour  Intake 3113.24 ml  Output 2020 ml  Net 1093.24 ml   Filed Weights   02/18/18 1500  Weight: 68 kg   PE: HEENT: PERL, No icterus. Chest: clear to auscultation. No wheezes, crackles or rhonchi. Cor: s1s2, irregular rhythm. No murmur. No gallop. Abd: soft, non tender. No hepatomegaly. Ext: warm to touch. +1 pulses. No mottling, cyanosis. Trace pedal edema. Neuro: awake, alert and oriented. No gross motor deficits.    Ancillary tests (personally reviewed)  CBC: Recent Labs  Lab 02/18/18 1405 02/18/18 1411 02/18/18 1840 02/19/18 0516 02/20/18 0329 02/21/18 0440  WBC 12.6*  --  10.6* 16.9* 11.3* 10.5  NEUTROABS 9.8*  --   --   --   --   --   HGB 12.4 12.6 11.1* 12.1 11.6* 11.1*  HCT 38.9 37.0 32.9* 37.4 33.8* 33.8*  MCV 102.1*  --  100.3* 103.0* 98.0 99.1  PLT 102*  --  99* 115* 115* 124*    Basic Metabolic Panel: Recent Labs  Lab 02/18/18 1405 02/18/18 1411 02/19/18 0516 02/20/18 0329 02/21/18 0440  NA 132* 130* 135 134* 136  K 5.1 5.0 4.8 3.5 3.4*  CL 96* 100 102 99 100  CO2 16*  --  15* 20* 24  GLUCOSE 103* 100* 102* 143* 157*  BUN 66* 64* 73* 57* 32*  CREATININE 3.80* 4.00* 2.94* 1.82* 1.17*  CALCIUM 9.9  --  8.9 8.4* 8.4*   GFR: Estimated Creatinine Clearance: 33.7 mL/min (A) (by C-G formula based on SCr of 1.17 mg/dL (H)). Recent Labs  Lab 02/18/18 1412 02/18/18 1840 02/19/18 0516 02/20/18 0329 02/20/18 2021 02/20/18 2305  02/21/18 0440  WBC  --  10.6* 16.9* 11.3*  --   --  10.5  LATICACIDVEN 5.37*  --   --   --  2.3* 1.8  --     Liver Function Tests: Recent Labs  Lab 02/18/18 1405 02/20/18 0329  AST 970* 814*  ALT 972* 1,524*  ALKPHOS 26* 33*  BILITOT 2.5* 2.3*  PROT 6.2* 5.6*  ALBUMIN 3.7 2.9*   No results for input(s): LIPASE, AMYLASE in the last 168 hours. No results for input(s): AMMONIA in the last 168 hours.  ABG    Component Value Date/Time   PHART 7.356 02/18/2018 1828   PCO2ART 34.1 02/18/2018 1828   PO2ART 391.0 (H) 02/18/2018 1828   HCO3 19.3 (L) 02/18/2018 1828   TCO2 20 (L) 02/18/2018 1828   ACIDBASEDEF 6.0 (H) 02/18/2018 1828   O2SAT 100.0 02/18/2018 1828     Coagulation Profile: Recent Labs  Lab 02/18/18 1405  INR 1.63    Cardiac Enzymes: Recent Labs  Lab 02/18/18 1405 02/18/18 1840 02/18/18 2245 02/19/18 0516  TROPONINI 39.64* >65.00* >65.00* 57.84*    HbA1C: No results found for: HGBA1C  CBG: Recent Labs  Lab 02/18/18 1852  GLUCAP 97   Echocardiogram (personally reviewed): Inferior LV WMA, but global function normal. RV at least. moderately enlarged and moderately impaired function. Elevated left-sided filling pressures by Doppler.  Assessment & Plan:   Critically ill due to acute respiratory failure requiring mechanical ventilation; resolved. Cardiogenic/vasodilatory shock requiring titration of Levophed. S/P STEMI with evidence of RV involvement. AKI  Plan: On nasal canula o2. Maintain spo2 > 92% RV dysfunction persists; receiving iv fluids per cardiology's recommendation and peripheral levophed; will assess for PICC insertion due to the unknown duration of levophed administration. Reduced to 10 mcg overnight.  Follow renal function and monitor volume status. Pacemaker removed by cardiology. Patient current in a. Fib, rate 85/min  Best practice:  Diet: PO  Pain/Anxiety/Delirium protocol (if indicated): n/a. VAP protocol (if  indicated):n/a DVT prophylaxis: On Aggrestat. GI prophylaxis: Famotidine Glucose control: Phase I Mobility: Bed rest, Phase I Cardiac rehabilitation once extubated, when cleared by cardiology. Code Status: Full. Family Communication: Updated patient at bedside. Disposition:  ICU   Critical care time: 35 min including assessment of patient, review of echo and labs, and supervision and adjustment of mechanical ventilation.    Jannet AskewMichael Kathy Wares, MD [facility]  [phone].  02/21/2018, 5:54 PM

## 2018-02-21 NOTE — Progress Notes (Addendum)
At bedside to place PICC.  RN states CVC was placed last HS and no need for PICC placement.  Pt A&O aware of conversation. Will complete PICC order.  RN aware to d/c all PIV's that are not needed.

## 2018-02-21 NOTE — Progress Notes (Signed)
ANTICOAGULATION CONSULT NOTE - Initial Consult  Pharmacy Consult for heparin Indication: atrial fibrillation  Allergies  Allergen Reactions  . Clindamycin/Lincomycin Diarrhea    "c-diff"    Patient Measurements: Height: 5\' 3"  (160 cm) Weight: 149 lb 14.6 oz (68 kg) IBW/kg (Calculated) : 52.4 Heparin Dosing Weight: 68kg  Vital Signs: Temp: 97.8 F (36.6 C) (12/22 0400) Temp Source: Oral (12/22 0400) BP: 129/91 (12/22 0915) Pulse Rate: 85 (12/22 0915)  Labs: Recent Labs    02/18/18 1405  02/18/18 1840 02/18/18 2245 02/19/18 0516 02/20/18 0329 02/21/18 0440  HGB 12.4   < > 11.1*  --  12.1 11.6* 11.1*  HCT 38.9   < > 32.9*  --  37.4 33.8* 33.8*  PLT 102*  --  99*  --  115* 115* 124*  APTT 30  --   --   --   --   --   --   LABPROT 19.1*  --   --   --   --   --   --   INR 1.63  --   --   --   --   --   --   CREATININE 3.80*   < >  --   --  2.94* 1.82* 1.17*  TROPONINI 39.64*  --  >65.00* >65.00* 57.84*  --   --    < > = values in this interval not displayed.    Estimated Creatinine Clearance: 33.7 mL/min (A) (by C-G formula based on SCr of 1.17 mg/dL (H)).   Medical History: Past Medical History:  Diagnosis Date  . Diverticulosis of colon   . Essential tremor   . GERD (gastroesophageal reflux disease)    occasional (no meds)  . History of basal cell carcinoma excision 2012  . Hypertension   . Osteoporosis   . PONV (postoperative nausea and vomiting)   . Rectocele    symptomatic   Assessment: 84 year old female s/p MI with PCI to RCA last week. Patient now with new afib and orders to start IV heparin.   Hgb stable in 11s, plt count low but stable at 124.   Lovenox 30mg  given this am ~0800.   Goal of Therapy:  Heparin level 0.3-0.7 units/ml Monitor platelets by anticoagulation protocol: Yes   Plan:  Will not bolus with lovenox given at this am Start heparin infusion at 950 units/hr Check anti-Xa level in 8 hours and daily while on heparin Continue  to monitor H&H and platelets  Sheppard Coil PharmD., BCPS Clinical Pharmacist 02/21/2018 10:02 AM

## 2018-02-21 NOTE — Progress Notes (Signed)
Nonpitting slightly puffy lower extremities. Seems intervascularly dry. Trying to wean levophed, bp map still hanging around 60 at times, sbp 80-115. Asymptomatic even with ambulation to commode when bp increased. Order obtained for ns 250 bolus. Given now

## 2018-02-21 NOTE — Progress Notes (Signed)
OT Cancellation Note  Patient Details Name: Charlene Dickerson MRN: [id number] DOB: [birth year]   Cancelled Treatment:    Reason Eval/Treat Not Completed: Active bedrest order;Medical issues which prohibited therapy(transvenous pacer) Will return as pt is medically ready and as schedule allows. Thank you.   Kasheena Sambrano M Jama Krichbaum Delorese Sellin MSOT, OTR/L Acute Rehab Pager: [phone] Office: [phone] 02/21/2018, 7:49 AM

## 2018-02-21 NOTE — Progress Notes (Signed)
Neuro intact, denies pain. Afebrile. Levo gtts weaned to 10, SBP 80-110, MAP >60. Asymptomatic. RC afib, no noted ectopy. Heparin gtts infusing. RA LSCTA. + pulses, trace nonpitting edema in lower extremities. Fair appetite, fluids encouraged, no further nausea since zofran this AM. Bowel meds added. Foley dc'd, + void using BSC without issue. Labs WNL. Up with 1-2 assist, unsteady. Family @ bedside throughout shift

## 2018-02-21 NOTE — Progress Notes (Signed)
eLink Physician-Brief Progress Note Patient Name: Charlene Dickerson DOB: 07-Nov-1932 MRN: [id number]   Date of Service  02/21/2018  HPI/Events of Note  Patient c/o foot pain. Foot is reported to by warm and DP and PT pulses intact. Tylenol ordered, however, AST/ALT elevated several days ago. Creatinine = 1.17.  eICU Interventions  Will order: 1. Motrin 400 mg PO X 1 now.  2. CMP already pending for AM.     Intervention Category Intermediate Interventions: Pain - evaluation and management  Sommer,Steven Eugene 02/21/2018, 8:08 PM

## 2018-02-22 DIAGNOSIS — R579 Shock, unspecified: Secondary | ICD-10-CM

## 2018-02-22 DIAGNOSIS — I4819 Other persistent atrial fibrillation: Secondary | ICD-10-CM

## 2018-02-22 LAB — COMPREHENSIVE METABOLIC PANEL
ALT: 840 U/L — ABNORMAL HIGH (ref 0–44)
AST: 171 U/L — ABNORMAL HIGH (ref 15–41)
Albumin: 2.4 g/dL — ABNORMAL LOW (ref 3.5–5.0)
Alkaline Phosphatase: 50 U/L (ref 38–126)
Anion gap: 8 (ref 5–15)
BUN: 22 mg/dL (ref 8–23)
CO2: 26 mmol/L (ref 22–32)
Calcium: 7.8 mg/dL — ABNORMAL LOW (ref 8.9–10.3)
Chloride: 101 mmol/L (ref 98–111)
Creatinine, Ser: 1 mg/dL (ref 0.44–1.00)
GFR calc Af Amer: >60 mL/min (ref >60)
GFR, EST NON AFRICAN AMERICAN: 52 mL/min — AB (ref >60)
Glucose, Bld: 121 mg/dL — ABNORMAL HIGH (ref 70–99)
Potassium: 3.6 mmol/L (ref 3.5–5.1)
Sodium: 135 mmol/L (ref 135–145)
Total Bilirubin: 1.2 mg/dL (ref 0.3–1.2)
Total Protein: 5.1 g/dL — ABNORMAL LOW (ref 6.5–8.1)

## 2018-02-22 LAB — CBC
HCT: 32.3 % — ABNORMAL LOW (ref 36.0–46.0)
Hemoglobin: 10.8 g/dL — ABNORMAL LOW (ref 12.0–15.0)
MCH: 34.3 pg — ABNORMAL HIGH (ref 26.0–34.0)
MCHC: 33.4 g/dL (ref 30.0–36.0)
MCV: 102.5 fL — ABNORMAL HIGH (ref 80.0–100.0)
Platelets: 152 10*3/uL (ref 150–400)
RBC: 3.15 MIL/uL — ABNORMAL LOW (ref 3.87–5.11)
RDW: 13.3 % (ref 11.5–15.5)
WBC: 8.6 10*3/uL (ref 4.0–10.5)
nRBC: 0.6 % — ABNORMAL HIGH (ref 0.0–0.2)

## 2018-02-22 LAB — CORTISOL: CORTISOL PLASMA: 24.7 ug/dL

## 2018-02-22 LAB — HEPARIN LEVEL (UNFRACTIONATED): Heparin Unfractionated: 0.35 IU/mL (ref 0.30–0.70)

## 2018-02-22 MED ORDER — APIXABAN 5 MG PO TABS
5.0000 mg | ORAL_TABLET | Freq: Two times a day (BID) | ORAL | Status: DC
  Administered 2018-02-22 – 2018-02-24 (×5): 5 mg via ORAL
  Filled 2018-02-22 (×5): qty 1

## 2018-02-22 MED ORDER — PANTOPRAZOLE SODIUM 40 MG PO TBEC
40.0000 mg | DELAYED_RELEASE_TABLET | Freq: Every day | ORAL | Status: DC
  Administered 2018-02-22 – 2018-02-23 (×2): 40 mg via ORAL
  Filled 2018-02-22 (×2): qty 1

## 2018-02-22 NOTE — Evaluation (Signed)
Occupational Therapy Evaluation Patient Details Name: Charlene Dickerson MRN: [id number] DOB: Sep 23, 1932 Today's Date: 02/22/2018    History of Present Illness 84 y.o. female with a history of hypertension but no known cardiac history who [facility] after being found by a neighbor with acute onset of altered mental status. She was found to have an inferior STEMI with complete heart block.  Cardiac catheterization December 19 showed an 80% proximal left circumflex, 50% proximal LAD, 99% proximal right coronary artery followed by 100% mid.  Patient had PCI of the RCA   Clinical Impression   Pt was independent and active prior to admission. Presents with generalized weakness, decreased activity tolerance and impaired standing balance. Pt with decreased awareness of deficits. Pt will need post acute rehab prior to return home as she lives alone with intermittent assistance of her neighbors. Will follow.   Follow Up Recommendations  CIR    Equipment Recommendations  3 in 1 bedside commode    Recommendations for Other Services       Precautions / Restrictions Precautions Precautions: Fall Precaution Comments: watch VS and BP Restrictions Weight Bearing Restrictions: No      Mobility Bed Mobility               General bed mobility comments: received in chair  Transfers Overall transfer level: Needs assistance Equipment used: Rolling walker (2 wheeled) Transfers: Sit to/from Stand Sit to Stand: Min assist;+2 physical assistance;+2 safety/equipment         General transfer comment: Min assist for stability, posterior list in upright. heavy relaince on RW    Balance Overall balance assessment: Needs assistance Sitting-balance support: Feet supported Sitting balance-Leahy Scale: Fair   Postural control: Posterior lean Standing balance support: During functional activity;Bilateral upper extremity supported Standing balance-Leahy Scale:  Poor Standing balance comment: reliance on RW for UE support in addition to external assist due to posterior list                           ADL either performed or assessed with clinical judgement   ADL Overall ADL's : Needs assistance/impaired Eating/Feeding: Independent;Sitting   Grooming: Standing;Min guard   Upper Body Bathing: Minimal assistance;Sitting   Lower Body Bathing: Moderate assistance;Sit to/from stand   Upper Body Dressing : Minimal assistance;Sitting   Lower Body Dressing: Moderate assistance;Sit to/from stand   Toilet Transfer: Minimal assistance;RW;Transfer board;+2 for safety/equipment   Toileting- Clothing Manipulation and Hygiene: Minimal assistance;Sit to/from stand       Functional mobility during ADLs: +2 for safety/equipment;Minimal assistance;Rolling walker       Vision Patient Visual Report: No change from baseline       Perception     Praxis      Pertinent Vitals/Pain Pain Assessment: Faces Faces Pain Scale: Hurts little more Pain Location: calves Pain Descriptors / Indicators: Sore Pain Intervention(s): Monitored during session     Hand Dominance Right   Extremity/Trunk Assessment Upper Extremity Assessment Upper Extremity Assessment: Generalized weakness   Lower Extremity Assessment Lower Extremity Assessment: Generalized weakness       Communication Communication Communication: HOH   Cognition Arousal/Alertness: Awake/alert Behavior During Therapy: WFL for tasks assessed/performed Overall Cognitive Status: Impaired/Different from baseline Area of Impairment: Following commands;Safety/judgement;Awareness;Problem solving                       Following Commands: Follows multi-step commands with increased time Safety/Judgement: Decreased  awareness of safety Awareness: Emergent Problem Solving: Requires verbal cues;Requires tactile cues     General Comments       Exercises     Shoulder  Instructions      Home Living Family/patient expects to be discharged to:: Private residence Living Arrangements: Alone Available Help at Discharge: Family;Friend(s);Available PRN/intermittently Type of Home: House Home Access: Stairs to enter [employer] of Steps: 3 Entrance Stairs-Rails: Right;Left;Can reach both Home Layout: Two level;Able to live on main level with bedroom/bathroom Alternate Level Stairs-Number of Steps: flight Alternate Level Stairs-Rails: Can reach both Bathroom Shower/Tub: Producer, television/film/video: Standard     Home Equipment: Cane - single point;Walker - 4 wheels;Shower seat - built in          Prior Functioning/Environment Level of Independence: Independent        Comments: walks in the park every day (weather permitting)        OT Problem List: Decreased activity tolerance;Impaired balance (sitting and/or standing);Decreased knowledge of use of DME or AE;Pain;Decreased cognition;Decreased safety awareness;Decreased strength      OT Treatment/Interventions: Self-care/ADL training;Energy conservation;DME and/or AE instruction;Cognitive remediation/compensation;Patient/family education;Balance training;Therapeutic activities    OT Goals(Current goals can be found in the care plan section) Acute Rehab OT Goals Patient Stated Goal: to go home OT Goal Formulation: With patient Time For Goal Achievement: 03/08/18 Potential to Achieve Goals: Good ADL Goals Pt Will Perform Grooming: with supervision;standing Pt Will Perform Upper Body Bathing: with set-up;sitting Pt Will Perform Lower Body Bathing: with supervision;sit to/from stand Pt Will Perform Upper Body Dressing: with set-up;sitting Pt Will Perform Lower Body Dressing: with supervision;sit to/from stand Pt Will Transfer to Toilet: with supervision;ambulating;bedside commode(over toilet) Pt Will Perform Toileting - Clothing Manipulation and hygiene: with supervision;sit  to/from stand  OT Frequency: Min 2X/week   Barriers to D/C: Decreased caregiver support          Co-evaluation PT/OT/SLP Co-Evaluation/Treatment: Yes Reason for Co-Treatment: Complexity of the patient's impairments (multi-system involvement) PT goals addressed during session: Mobility/safety with mobility;Balance OT goals addressed during session: ADL's and self-care      AM-PAC OT "6 Clicks" Daily Activity     Outcome Measure Help from another person eating meals?: None Help from another person taking care of personal grooming?: A Little Help from another person toileting, which includes using toliet, bedpan, or urinal?: A Little Help from another person bathing (including washing, rinsing, drying)?: A Lot Help from another person to put on and taking off regular upper body clothing?: A Little Help from another person to put on and taking off regular lower body clothing?: A Lot 6 Click Score: 17   End of Session Equipment Utilized During Treatment: Gait belt;Rolling walker Nurse Communication: Mobility status  Activity Tolerance: Patient tolerated treatment well(VSS) Patient left: in chair;with call bell/phone within reach  OT Visit Diagnosis: Unsteadiness on feet (R26.81);Other abnormalities of gait and mobility (R26.89);Muscle weakness (generalized) (M62.81);Pain;Other symptoms and signs involving cognitive function                Time: 4585-9292 OT Time Calculation (min): 21 min Charges:  OT General Charges $OT Visit: 1 Visit OT Evaluation $OT Eval Moderate Complexity: 1 Mod  Martie Round, OTR/L Acute Rehabilitation Services Pager: [phone] Office: [phone]  Evern Bio 02/22/2018, 10:38 AM

## 2018-02-22 NOTE — Care Management Note (Signed)
Case Management Note  Patient Details  Name: CHARMA MOCARSKI MRN: [id number] Date of Birth: [birth year]  Subjective/Objective: 84 yo patient presented after being found with AMS; s/p cath with PCI of the RCA.                   Action/Plan: CM met with patient to discuss transitional needs and PT/OT recommendations for CIR. Patient reports living at home alone and was independent with ADLs PTA; walked 2 miles in the park every day when weather permitted. PCP verified as: Dr. Marton Redwood; pharmacy of choice: [facility]. CM discussed PT/OT recommendation for CIR, with patient stating her daughter from [place] has been visiting and would be able to assist post-transition and right now she's "not sure" about CIR and then stated,"I don't think I will need it." CM team will continue following to assist with dispositional plan.   Expected Discharge Date:                  Expected Discharge Plan:  [place]  In-House Referral:  NA  Discharge planning Services  CM Consult  Post Acute Care Choice:  NA Choice offered to:  NA  DME Arranged:  N/A DME Agency:  NA  HH Arranged:  NA HH Agency:  NA  Status of Service:  In process, will continue to follow  If discussed at Long Length of Stay Meetings, dates discussed:    Additional Comments:  Midge Minium RN, BSN, NCM-BC, ACM-RN [phone] 02/22/2018, 2:53 PM

## 2018-02-22 NOTE — Progress Notes (Signed)
NAME:  Charlene Dickerson [place], MRN:  [id number], DOB:  10-01-1932, LOS: 4 ADMISSION DATE:  02/18/2018, CONSULTATION DATE:  02/18/2018 REFERRING MD:  Erlene QuanBerry, J - [facility], CHIEF COMPLAINT:  Respiratory failure.   BRIEF  84 year old woman referred for ventilator management. She suffered an IW STEMI with cardiogenic shock, requiring vasopressors and intubation. She had two Synergy DES deployed into the proximal RCA, placement of a temporary pacing wire. She was maintained on Aggrastat infusion to control luminal thrombus.    EVENTS 02/18/2018 - admit.  Echocardiogram : Inferior LV WMA, but global function normal. RV at least. moderately enlarged and moderately impaired function. Elevated left-sided filling pressures by Doppler.  02/21/18 - Bleeding from cath site controlled with overstitch and thrombi-pad. Tolerating SBT this morning.   SUBJECTIVE/OVERNIGHT/INTERVAL HX    12/23 - resolved AKI. Extubated. CVL stil in place. Pacer wires off. Ambulating with PT but needing 11mcg of levophed for MAP goal > 65, Afebrile without evidence of sepsis. Feels well. Remains in A Fib with HR 100  Objective   Blood pressure 114/84, pulse 96, temperature 97.9 F (36.6 Dickerson), temperature source Oral, resp. rate (!) 24, height 5\' 3"  (1.6 m), weight 68 kg, SpO2 100 %. CVP:  [10 mmHg-25 mmHg] 13 mmHg      Intake/Output Summary (Last 24 hours) at 02/22/2018 0920 Last data filed at 02/22/2018 0900 Gross per 24 hour  Intake 2616.55 ml  Output 1720 ml  Net 896.55 ml   Filed Weights   02/18/18 1500  Weight: 68 kg   General Appearance:  Looks well. Standing up with PT Head:  Normocephalic, without obvious abnormality, atraumatic Eyes:  PERRL - yes, conjunctiva/corneas - clear     Ears:  Normal external ear canals, both ears Nose:  G tube - no Throat:  ETT TUBE - no , OG tube - no Neck:  Supple,  No enlargement/tenderness/nodules Lungs: Clear to auscultation bilaterally,  Heart:  S1 and S2 normal, no  murmur, CVP - x.  Pressors - levophed + at 11mcg Abdomen:  Soft, no masses, no organomegaly Genitalia / Rectal:  Not done Extremities:  Extremities- intact Skin:  ntact in exposed areas . Neurologic:  Sedation - none -> RASS - +1 . Moves all 4s - yes. CAM-ICU - neg . Orientation - x3+     LABS    PULMONARY Recent Labs  Lab 02/18/18 1411 02/18/18 1828  PHART  --  7.356  PCO2ART  --  34.1  PO2ART  --  391.0*  HCO3  --  19.3*  TCO2 19* 20*  O2SAT  --  100.0    CBC Recent Labs  Lab 02/20/18 0329 02/21/18 0440 02/22/18 0511  HGB 11.6* 11.1* 10.8*  HCT 33.8* 33.8* 32.3*  WBC 11.3* 10.5 8.6  PLT 115* 124* 152    COAGULATION Recent Labs  Lab 02/18/18 1405  INR 1.63    CARDIAC   Recent Labs  Lab 02/18/18 1405 02/18/18 1840 02/18/18 2245 02/19/18 0516  TROPONINI 39.64* >65.00* >65.00* 57.84*   No results for input(s): PROBNP in the last 168 hours.   CHEMISTRY Recent Labs  Lab 02/18/18 1405 02/18/18 1411 02/19/18 0516 02/20/18 0329 02/21/18 0440 02/22/18 0511  NA 132* 130* 135 134* 136 135  K 5.1 5.0 4.8 3.5 3.4* 3.6  CL 96* 100 102 99 100 101  CO2 16*  --  15* 20* 24 26  GLUCOSE 103* 100* 102* 143* 157* 121*  BUN 66* 64* 73* 57* 32* 22  CREATININE 3.80* 4.00* 2.94* 1.82* 1.17* 1.00  CALCIUM 9.9  --  8.9 8.4* 8.4* 7.8*   Estimated Creatinine Clearance: 39.4 mL/min (by Dickerson-G formula based on SCr of 1 mg/dL).   LIVER Recent Labs  Lab 02/18/18 1405 02/20/18 0329 02/22/18 0511  AST 970* 814* 171*  ALT 972* 1,524* 840*  ALKPHOS 26* 33* 50  BILITOT 2.5* 2.3* 1.2  PROT 6.2* 5.6* 5.1*  ALBUMIN 3.7 2.9* 2.4*  INR 1.63  --   --      INFECTIOUS Recent Labs  Lab 02/18/18 1412 02/20/18 2021 02/20/18 2305  LATICACIDVEN 5.37* 2.3* 1.8     ENDOCRINE CBG (last 3)  No results for input(s): GLUCAP in the last 72 hours.       IMAGING x48h  - image(s) personally visualized  -   highlighted in bold Dg Chest Port 1 View  Result Date:  02/20/2018 CLINICAL DATA:  Central line placement EXAM: PORTABLE CHEST 1 VIEW COMPARISON:  One day prior FINDINGS: Right-sided femoral line terminating over the right ventricle. Placement of a right internal jugular line with tip at high right atrium. Numerous leads and wires project over the chest. Midline trachea. Mild cardiomegaly. Small left pleural effusion. No pneumothorax. Resolution of pulmonary venous congestion. Persistent mild left base airspace disease. IMPRESSION: 1. Placement of a right internal jugular line with tip at high right atrium. No pneumothorax. 2. Cardiomegaly with resolution of pulmonary venous congestion. 3. Persistent small left pleural effusion with adjacent atelectasis. Electronically Signed   By: Jeronimo Greaves M.D.   On: 02/20/2018 21:42   [place] Ekg Site Rite  Result Date: 02/20/2018 If Site Rite image not attached, placement could not be confirmed due to current cardiac rhythm.    Assessment & Plan:   Circulatory shock  -due to RV failure; improving  Plan  - reduce map goal to > 60 with sbp goal > 95 an wean pressos off (esp with concomitant A fib) - check lytes -check random cortisol  - if pressor needs do not improve consider hydrocort (will check random cortisol) +/- fluid bolus   Best practice:  Diet: PO  Pain/Anxiety/Delirium protocol (if indicated): n/a. VAP protocol (if indicated):n/a DVT prophylaxis: On Aggrestat. GI prophylaxis: Famotidine Glucose control: Phase I Mobility: Bed rest, Phase I Cardiac rehabilitation once extubated, when cleared by cardiology. Code Status: Full. Family Communication: Updated patient at bedside. D.w Dr Elease Hashimoto at bedside Disposition:  ICU   ATTESTATION & SIGNATURE   The patient Charlene Dickerson is critically ill with multiple organ systems failure and requires high complexity decision making for assessment and support, frequent evaluation and titration of therapies, application of advanced monitoring technologies and  extensive interpretation of multiple databases.   Critical Care Time devoted to patient care services described in this note is  30  Minutes. This time reflects time of care of this signee Dr Kalman Shan. This critical care time does not reflect procedure time, or teaching time or supervisory time of PA/NP/Med student/Med Resident etc but could involve care discussion time     Dr. Kalman Shan, M.D., [facility].Dickerson.P Pulmonary and Critical Care Medicine Staff Physician [place] [facility]  Pulmonary and Critical Care Pager: [phone], If no answer or between  15:00h - 7:00h: call [phone]  02/22/2018 9:21 AM

## 2018-02-22 NOTE — Progress Notes (Addendum)
Rehab Admissions Coordinator Note:  Patient was screened by Clois Dupes for appropriateness for an Inpatient Acute Rehab Consult per PT and OT recommendations.   At this time, we are recommending Inpatient Rehab consult. Please place order when felt appropriate.  Ottie Glazier, RN, MSN Rehab Admissions Coordinator [phone] 02/22/2018 12:00 PM

## 2018-02-22 NOTE — Progress Notes (Signed)
Pt complains of pain 5/10 in the right foot. Pt states it feels achy and sore. Palpable pedal pulse of +2. Tylenol was ordered as a PRN but d/t elevated liver enzymes on 12/21 this RN did not administer. Elink Nurse notified. New orders for PO motrin. RN will continue monitor pt closely.

## 2018-02-22 NOTE — Progress Notes (Signed)
Progress Note  Patient Name: Charlene Dickerson Date of Encounter: 02/22/2018  Primary Cardiologist: Jodelle RedBridgette Christopher, MD   Subjective   84 year old female with a recent inferior wall myocardial infarction complicated by heart block.  She had a temporary pacing.  She also had an RV infarction.  She is gradually improved.  Has gone into atrial fib  HR is now fast.    Inpatient Medications    Scheduled Meds: . aspirin  81 mg Oral Daily  . atorvastatin  40 mg Oral q1800  . docusate sodium  100 mg Oral Daily  . feeding supplement  1 Container Oral TID BM  . latanoprost  1 drop Both Eyes QHS  . pantoprazole sodium  40 mg Per Tube QHS  . sodium chloride flush  3 mL Intravenous Q12H  . ticagrelor  90 mg Oral BID   Continuous Infusions: . sodium chloride    . heparin 950 Units/hr (02/22/18 0800)  . norepinephrine (LEVOPHED) Adult infusion 11 mcg/min (02/22/18 0800)   PRN Meds: sodium chloride, acetaminophen, naphazoline-glycerin, ondansetron (ZOFRAN) IV, polyethylene glycol, sodium chloride flush   Vital Signs    Vitals:   02/22/18 0718 02/22/18 0748 02/22/18 0800 02/22/18 0900  BP: 99/74  (!) 101/58 114/84  Pulse:   (!) 103 96  Resp: (!) 24  18 (!) 24  Temp:  97.9 F (36.6 C)    TempSrc:  Oral    SpO2:   94% 100%  Weight:      Height:        Intake/Output Summary (Last 24 hours) at 02/22/2018 0931 Last data filed at 02/22/2018 0900 Gross per 24 hour  Intake 2616.55 ml  Output 1720 ml  Net 896.55 ml   Filed Weights   02/18/18 1500  Weight: 68 kg    Telemetry      Atrial fib with RVR -  Personally Reviewed  ECG     Physical Exam    GEN:  Elderly female.  No acute distress.    Is in good spirits. Neck: No JVD Cardiac:  Regularly irregular. Respiratory: Clear to auscultation bilaterally. GI: Soft, nontender, non-distended  MS: No edema; No deformity. Neuro:  Nonfocal  Psych: Normal affect   Labs    Chemistry Recent Labs  Lab 02/18/18 1405   02/20/18 0329 02/21/18 0440 02/22/18 0511  NA 132*   < > 134* 136 135  K 5.1   < > 3.5 3.4* 3.6  CL 96*   < > 99 100 101  CO2 16*   < > 20* 24 26  GLUCOSE 103*   < > 143* 157* 121*  BUN 66*   < > 57* 32* 22  CREATININE 3.80*   < > 1.82* 1.17* 1.00  CALCIUM 9.9   < > 8.4* 8.4* 7.8*  PROT 6.2*  --  5.6*  --  5.1*  ALBUMIN 3.7  --  2.9*  --  2.4*  AST 970*  --  814*  --  171*  ALT 972*  --  1,524*  --  840*  ALKPHOS 26*  --  33*  --  50  BILITOT 2.5*  --  2.3*  --  1.2  GFRNONAA 10*   < > 25* 43* 52*  GFRAA 12*   < > 29* 50* >60  ANIONGAP 20*   < > 15 12 8    < > = values in this interval not displayed.     Hematology Recent Labs  Lab 02/20/18 [id number] 02/21/18 0440  02/22/18 0511  WBC 11.3* 10.5 8.6  RBC 3.45* 3.41* 3.15*  HGB 11.6* 11.1* 10.8*  HCT 33.8* 33.8* 32.3*  MCV 98.0 99.1 102.5*  MCH 33.6 32.6 34.3*  MCHC 34.3 32.8 33.4  RDW 12.9 12.8 13.3  PLT 115* 124* 152    Cardiac Enzymes Recent Labs  Lab 02/18/18 1405 02/18/18 1840 02/18/18 2245 02/19/18 0516  TROPONINI 39.64* >65.00* >65.00* 57.84*    Recent Labs  Lab 02/18/18 1410  TROPIPOC 23.56*     BNPNo results for input(s): BNP, PROBNP in the last 168 hours.   DDimer No results for input(s): DDIMER in the last 168 hours.   Radiology    Dg Chest Port 1 View  Result Date: 02/20/2018 CLINICAL DATA:  Central line placement EXAM: PORTABLE CHEST 1 VIEW COMPARISON:  One day prior FINDINGS: Right-sided femoral line terminating over the right ventricle. Placement of a right internal jugular line with tip at high right atrium. Numerous leads and wires project over the chest. Midline trachea. Mild cardiomegaly. Small left pleural effusion. No pneumothorax. Resolution of pulmonary venous congestion. Persistent mild left base airspace disease. IMPRESSION: 1. Placement of a right internal jugular line with tip at high right atrium. No pneumothorax. 2. Cardiomegaly with resolution of pulmonary venous congestion. 3.  Persistent small left pleural effusion with adjacent atelectasis. Electronically Signed   By: Jeronimo Greaves M.D.   On: 02/20/2018 21:42   [place] Ekg Site Rite  Result Date: 02/20/2018 If Site Rite image not attached, placement could not be confirmed due to current cardiac rhythm.   Cardiac Studies     Patient Profile     84 y.o. female with a recent inferior wall myocardial infarction complicated by a right ventricular infarction.  Was complicated by complete heart block and she required a temporary pacemaker.  Assessment & Plan    .  Inferior wall myocardial infarction/right ventricular infarction She seems to be doing better.  She is no longer bradycardic.  She is developed atrial fibrillation her rate is on the rapid side.  He still on low-dose Levophed.  I discussed this with Dr. Marchelle Gearing.  We will correct gradually titrate the Levophed to off.  2.  Atrial fibrillation: She remains on heparin. Discontinue heparin and start her on Eliquis. We will likely change the Brilinta to Plavix since she will also be on Eliquis. Spaete stopping aspirin in 1 month.  For questions or updates, please contact [facility] Please consult [email or web address] for contact info under        Signed, Kristeen Miss, MD  02/22/2018, 9:31 AM

## 2018-02-22 NOTE — Evaluation (Signed)
Physical Therapy Evaluation Patient Details Name: Sherlean Footancy C Imler MRN: [id number] DOB: 17-Jun-1932 Today's Date: 02/22/2018   History of Present Illness  84 y.o. female with a history of hypertension but no known cardiac history who [facility] after being found by a neighbor with acute onset of altered mental status. She was found to have an inferior STEMI with complete heart block.  Cardiac catheterization December 19 showed an 80% proximal left circumflex, 50% proximal LAD, 99% proximal right coronary artery followed by 100% mid.  Patient had PCI of the RCA  Clinical Impression  Orders received for PT evaluation. Patient demonstrates deficits in functional mobility as indicated below. Will benefit from continued skilled PT to address deficits and maximize function. Will see as indicated and progress as tolerated.  Prior to admission, patient was extremely independent with activity, [place], lives alone but has good neighbor and family support. Currently, feel patient would benefit from comprehensive inpatient therapiest upon acute discharge to maximize recovery of independence.    Follow Up Recommendations CIR    Equipment Recommendations  (TBD)    Recommendations for Other Services Rehab consult     Precautions / Restrictions Precautions Precautions: Fall Precaution Comments: watch VS and BP Restrictions Weight Bearing Restrictions: No      Mobility  Bed Mobility               General bed mobility comments: received in chair  Transfers Overall transfer level: Needs assistance Equipment used: Rolling walker (2 wheeled) Transfers: Sit to/from Stand Sit to Stand: Min assist;+2 physical assistance;+2 safety/equipment         General transfer comment: Min assist for stability, posterior list in upright. heavy relaince on RW  Ambulation/Gait Ambulation/Gait assistance: Min assist;Mod assist;+2  safety/equipment(mod assist for instability with 3 LOB) Gait Distance (Feet): 18 Feet Assistive device: Rolling walker (2 wheeled) Gait Pattern/deviations: Step-through pattern;Decreased stride length;Shuffle;Trunk flexed Gait velocity: decreased Gait velocity interpretation: <1.31 ft/sec, indicative of household ambulator General Gait Details: patient with posterior bias, min assist for stability during short distance in room ambulation. Heavy reliance on UE support via RW  Stairs            Wheelchair Mobility    Modified Rankin (Stroke Patients Only)       Balance Overall balance assessment: Needs assistance Sitting-balance support: Feet supported Sitting balance-Leahy Scale: Fair   Postural control: Posterior lean Standing balance support: During functional activity;Bilateral upper extremity supported Standing balance-Leahy Scale: Poor Standing balance comment: reliance on RW for UE support in addition to external assist due to posterior list                             Pertinent Vitals/Pain Pain Assessment: Faces Pain Location: right foot Pain Descriptors / Indicators: Sore Pain Intervention(s): Monitored during session    Home Living Family/patient expects to be discharged to:: Private residence Living Arrangements: Alone Available Help at Discharge: Family;Friend(s);Available PRN/intermittently Type of Home: House Home Access: Stairs to enter Entrance Stairs-Rails: Right;Left;Can reach both Entrance Stairs-Number of Steps: 3 Home Layout: Two level;Able to live on main level with bedroom/bathroom Home Equipment: Gilmer MorCane - single point;Walker - 4 wheels;Shower seat - built in      Prior Function Level of Independence: Independent         Comments: walks in the park every day (weather permitting)     Hand Dominance   Dominant  Hand: Right    Extremity/Trunk Assessment   Upper Extremity Assessment Upper Extremity Assessment: Generalized  weakness    Lower Extremity Assessment Lower Extremity Assessment: Generalized weakness       Communication   Communication: HOH  Cognition Arousal/Alertness: Awake/alert Behavior During Therapy: WFL for tasks assessed/performed Overall Cognitive Status: Impaired/Different from baseline Area of Impairment: Following commands;Safety/judgement;Awareness;Problem solving                       Following Commands: Follows multi-step commands with increased time Safety/Judgement: Decreased awareness of safety Awareness: Emergent Problem Solving: Requires verbal cues;Requires tactile cues        General Comments      Exercises     Assessment/Plan    PT Assessment Patient needs continued PT services  PT Problem List Decreased strength;Decreased activity tolerance;Decreased balance;Decreased mobility;Decreased cognition;Decreased knowledge of use of DME;Decreased safety awareness;Cardiopulmonary status limiting activity       PT Treatment Interventions DME instruction;Gait training;Stair training;Functional mobility training;Therapeutic activities;Therapeutic exercise;Balance training;Cognitive remediation;Patient/family education    PT Goals (Current goals can be found in the Care Plan section)  Acute Rehab PT Goals Patient Stated Goal: to go home PT Goal Formulation: With patient Time For Goal Achievement: 03/08/18 Potential to Achieve Goals: Good    Frequency Min 3X/week   Barriers to discharge        Co-evaluation PT/OT/SLP Co-Evaluation/Treatment: Yes Reason for Co-Treatment: Complexity of the patient's impairments (multi-system involvement);For patient/therapist safety;To address functional/ADL transfers PT goals addressed during session: Mobility/safety with mobility;Balance OT goals addressed during session: ADL's and self-care       AM-PAC PT "6 Clicks" Mobility  Outcome Measure Help needed turning from your back to your side while in a flat bed  without using bedrails?: A Little Help needed moving from lying on your back to sitting on the side of a flat bed without using bedrails?: A Little Help needed moving to and from a bed to a chair (including a wheelchair)?: A Little Help needed standing up from a chair using your arms (e.g., wheelchair or bedside chair)?: A Lot Help needed to walk in hospital room?: A Lot Help needed climbing 3-5 steps with a railing? : A Lot 6 Click Score: 15    End of Session   Activity Tolerance: Patient tolerated treatment well;Patient limited by fatigue Patient left: in chair;with call bell/phone within reach Nurse Communication: Mobility status PT Visit Diagnosis: Unsteadiness on feet (R26.81);Muscle weakness (generalized) (M62.81);Difficulty in walking, not elsewhere classified (R26.2)    Time: 9983-3825 PT Time Calculation (min) (ACUTE ONLY): 21 min   Charges:   PT Evaluation $PT Eval Moderate Complexity: 1 Mod          Charlotte Crumb, PT DPT  Board Certified Neurologic Specialist Acute Rehabilitation Services Pager [phone] Office [phone]   Fabio Asa 02/22/2018, 10:24 AM

## 2018-02-22 NOTE — Progress Notes (Addendum)
ANTICOAGULATION CONSULT NOTE   Pharmacy Consult for heparin Indication: atrial fibrillation   Patient Measurements: Height: 5\' 3"  (160 cm) Weight: 149 lb 14.6 oz (68 kg) IBW/kg (Calculated) : 52.4 Heparin Dosing Weight: 68kg  Vital Signs: Temp: 97.9 F (36.6 C) (12/23 0748) Temp Source: Oral (12/23 0748) BP: 101/58 (12/23 0800) Pulse Rate: 103 (12/23 0800)  Labs: Recent Labs    02/20/18 0329 02/21/18 0440 02/21/18 1932 02/22/18 0511  HGB 11.6* 11.1*  --  10.8*  HCT 33.8* 33.8*  --  32.3*  PLT 115* 124*  --  152  HEPARINUNFRC  --   --  0.39 0.35  CREATININE 1.82* 1.17*  --  1.00    Assessment: 84 year old female s/p MI with PCI to RCA last week. Pt started on heparin for new afib. Heparin level is therapeutic at current rate. H/h 10.8/102, plts wnl.  Goal of Therapy:  Heparin level 0.3-0.7 units/ml Monitor platelets by anticoagulation protocol: Yes    Plan:  Continue heparin infusion at 950 units/hr Daily HL, CBC  F/u plan for longterm anticoagulation   Charlene Dickerson Friday 02/22/2018 8:37 AM   Addendum Stopping heparin Changing to Eliquis Eliquis 5 mg po bid Age is only criteria to necessitate lower dose, body weight and Scr are ok.    Charlene Dickerson Friday 02/22/2018 10:08 AM

## 2018-02-22 NOTE — Plan of Care (Signed)
  Problem: Education: Goal: Understanding of CV disease, CV risk reduction, and recovery process will improve Outcome: Progressing   Problem: Activity: Goal: Ability to return to baseline activity level will improve Outcome: Progressing   Problem: Cardiovascular: Goal: Ability to achieve and maintain adequate cardiovascular perfusion will improve Outcome: Progressing Goal: Vascular access site(s) Level 0-1 will be maintained Outcome: Progressing   Problem: Health Behavior/Discharge Planning: Goal: Ability to safely manage health-related needs after discharge will improve Outcome: Progressing   Problem: Cardiac: Goal: Ability to achieve and maintain adequate cardiopulmonary perfusion will improve Outcome: Progressing   Problem: Clinical Measurements: Goal: Ability to maintain clinical measurements within normal limits will improve Outcome: Progressing Goal: Will remain free from infection Outcome: Progressing Goal: Diagnostic test results will improve Outcome: Progressing Goal: Respiratory complications will improve Outcome: Progressing   Problem: Nutrition: Goal: Adequate nutrition will be maintained Outcome: Progressing   Problem: Coping: Goal: Level of anxiety will decrease Outcome: Progressing   Problem: Elimination: Goal: Will not experience complications related to bowel motility Outcome: Progressing

## 2018-02-23 LAB — CBC WITH DIFFERENTIAL/PLATELET
Abs Immature Granulocytes: 0.08 10*3/uL — ABNORMAL HIGH (ref 0.00–0.07)
Basophils Absolute: 0 10*3/uL (ref 0.0–0.1)
Basophils Relative: 0 %
Eosinophils Absolute: 0.3 10*3/uL (ref 0.0–0.5)
Eosinophils Relative: 5 %
HCT: 30.5 % — ABNORMAL LOW (ref 36.0–46.0)
Hemoglobin: 9.9 g/dL — ABNORMAL LOW (ref 12.0–15.0)
IMMATURE GRANULOCYTES: 1 %
Lymphocytes Relative: 13 %
Lymphs Abs: 0.9 10*3/uL (ref 0.7–4.0)
MCH: 33.8 pg (ref 26.0–34.0)
MCHC: 32.5 g/dL (ref 30.0–36.0)
MCV: 104.1 fL — ABNORMAL HIGH (ref 80.0–100.0)
Monocytes Absolute: 1.1 10*3/uL — ABNORMAL HIGH (ref 0.1–1.0)
Monocytes Relative: 16 %
NEUTROS PCT: 65 %
Neutro Abs: 4.5 10*3/uL (ref 1.7–7.7)
Platelets: 154 10*3/uL (ref 150–400)
RBC: 2.93 MIL/uL — ABNORMAL LOW (ref 3.87–5.11)
RDW: 13.4 % (ref 11.5–15.5)
WBC: 6.9 10*3/uL (ref 4.0–10.5)
nRBC: 0.7 % — ABNORMAL HIGH (ref 0.0–0.2)

## 2018-02-23 LAB — BASIC METABOLIC PANEL
Anion gap: 8 (ref 5–15)
BUN: 18 mg/dL (ref 8–23)
CALCIUM: 7.9 mg/dL — AB (ref 8.9–10.3)
CO2: 24 mmol/L (ref 22–32)
Chloride: 103 mmol/L (ref 98–111)
Creatinine, Ser: 0.82 mg/dL (ref 0.44–1.00)
GFR calc Af Amer: >60 mL/min (ref >60)
GFR calc non Af Amer: >60 mL/min (ref >60)
Glucose, Bld: 105 mg/dL — ABNORMAL HIGH (ref 70–99)
Potassium: 3.8 mmol/L (ref 3.5–5.1)
Sodium: 135 mmol/L (ref 135–145)

## 2018-02-23 LAB — HEPATIC FUNCTION PANEL
ALT: 580 U/L — ABNORMAL HIGH (ref 0–44)
AST: 89 U/L — ABNORMAL HIGH (ref 15–41)
Albumin: 2.3 g/dL — ABNORMAL LOW (ref 3.5–5.0)
Alkaline Phosphatase: 45 U/L (ref 38–126)
Bilirubin, Direct: 0.4 mg/dL — ABNORMAL HIGH (ref 0.0–0.2)
Indirect Bilirubin: 0.8 mg/dL (ref 0.3–0.9)
TOTAL PROTEIN: 4.9 g/dL — AB (ref 6.5–8.1)
Total Bilirubin: 1.2 mg/dL (ref 0.3–1.2)

## 2018-02-23 LAB — MAGNESIUM: Magnesium: 1.9 mg/dL (ref 1.7–2.4)

## 2018-02-23 LAB — PHOSPHORUS: PHOSPHORUS: 1.8 mg/dL — AB (ref 2.5–4.6)

## 2018-02-23 MED ORDER — ALPRAZOLAM 0.5 MG PO TABS
0.5000 mg | ORAL_TABLET | Freq: Once | ORAL | Status: AC
  Administered 2018-02-24: 0.5 mg via ORAL
  Filled 2018-02-23: qty 1

## 2018-02-23 MED ORDER — CLOPIDOGREL BISULFATE 75 MG PO TABS
75.0000 mg | ORAL_TABLET | Freq: Every day | ORAL | Status: DC
  Administered 2018-02-24: 75 mg via ORAL
  Filled 2018-02-23: qty 1

## 2018-02-23 MED ORDER — IBUPROFEN 200 MG PO TABS
400.0000 mg | ORAL_TABLET | Freq: Once | ORAL | Status: AC
  Administered 2018-02-23: 400 mg via ORAL
  Filled 2018-02-23: qty 2

## 2018-02-23 MED ORDER — CLOPIDOGREL BISULFATE 300 MG PO TABS
300.0000 mg | ORAL_TABLET | Freq: Once | ORAL | Status: AC
  Administered 2018-02-23: 300 mg via ORAL
  Filled 2018-02-23: qty 1

## 2018-02-23 MED ORDER — SODIUM CHLORIDE 0.9 % IV BOLUS
250.0000 mL | Freq: Once | INTRAVENOUS | Status: AC
  Administered 2018-02-23: 250 mL via INTRAVENOUS

## 2018-02-23 MED ORDER — PROPRANOLOL HCL 10 MG PO TABS
10.0000 mg | ORAL_TABLET | Freq: Two times a day (BID) | ORAL | Status: DC
  Administered 2018-02-23 – 2018-02-24 (×2): 10 mg via ORAL
  Filled 2018-02-23 (×3): qty 1

## 2018-02-23 NOTE — Progress Notes (Signed)
CARDIAC REHAB PHASE I   PRE:  Rate/Rhythm: 103 afib    BP: sitting 95/69    SaO2:   MODE:  Ambulation: 370 ft   POST:  Rate/Rhythm: 121 afib    BP: sitting 107/64     SaO2:   Pt able to walk to bathroom then around unit with RW and gait belt. She is somewhat weak and off balance, esp with standing initially. To recliner. Daughter present and very supportive. She sts she is able to stay with pt here for as long as she needs (daughter is retired, lives at [facility]). Ed completed with pt and daughter. Discussed MI, stent, Brilinta, Eliquis, NTG, diet, ex, and CRPII. Will refer to G'sO CRPII. Also gave her Off the Beat book for new afib. Good reception. Will offer videos as well. Encouraged more walking with staff.  [phone]   Harriet Masson CES, ACSM 02/23/2018 12:42 PM

## 2018-02-23 NOTE — Consult Note (Signed)
Huntley Dec RN aware of Dc central line order

## 2018-02-23 NOTE — Progress Notes (Signed)
Progress Note  Patient Name: Charlene Dickerson Date of Encounter: 02/23/2018  Primary Cardiologist: Jodelle RedBridgette Christopher, MD   Subjective   84 year old female with a recent inferior wall myocardial infarction complicated by heart block.  She had a temporary pacing.  She also had an RV infarction.  She is gradually improved.  Feeling much stronger today Has converted to NSR    Inpatient Medications    Scheduled Meds: . apixaban  5 mg Oral BID  . aspirin  81 mg Oral Daily  . atorvastatin  40 mg Oral q1800  . docusate sodium  100 mg Oral Daily  . feeding supplement  1 Container Oral TID BM  . latanoprost  1 drop Both Eyes QHS  . pantoprazole  40 mg Oral QHS  . sodium chloride flush  3 mL Intravenous Q12H  . ticagrelor  90 mg Oral BID   Continuous Infusions: . sodium chloride    . norepinephrine (LEVOPHED) Adult infusion 3 mcg/min (02/23/18 0827)   PRN Meds: sodium chloride, naphazoline-glycerin, ondansetron (ZOFRAN) IV, polyethylene glycol, sodium chloride flush   Vital Signs    Vitals:   02/23/18 0530 02/23/18 0630 02/23/18 0747 02/23/18 0827  BP: 109/62 (!) 103/52  (!) 99/58  Pulse: 77 70  73  Resp: 16 20  20   Temp:   97.7 F (36.5 C)   TempSrc:   Oral   SpO2: 97% (!) 83%  99%  Weight:      Height:        Intake/Output Summary (Last 24 hours) at 02/23/2018 0835 Last data filed at 02/23/2018 0800 Gross per 24 hour  Intake 1080.7 ml  Output 2450 ml  Net -1369.3 ml   Filed Weights   02/18/18 1500  Weight: 68 kg    Telemetry    NSR -  Personally Reviewed  ECG     Physical Exam   Physical Exam: Blood pressure (!) 99/58, pulse 73, temperature 97.7 F (36.5 C), temperature source Oral, resp. rate 20, height 5\' 3"  (1.6 m), weight 68 kg, SpO2 99 %.  GEN:  Well nourished, well developed in no acute distress HEENT: Normal NECK: No JVD; No carotid bruits LYMPHATICS: No lymphadenopathy CARDIAC: RRR  RESPIRATORY:  Clear to auscultation without rales,  wheezing or rhonchi  ABDOMEN: Soft, non-tender, non-distended MUSCULOSKELETAL:  No edema; No deformity  SKIN: Warm and dry NEUROLOGIC:  Alert and oriented x 3   Labs    Chemistry Recent Labs  Lab 02/20/18 0329 02/21/18 0440 02/22/18 0511 02/23/18 0038 02/23/18 0530  NA 134* 136 135 135  --   K 3.5 3.4* 3.6 3.8  --   CL 99 100 101 103  --   CO2 20* 24 26 24   --   GLUCOSE 143* 157* 121* 105*  --   BUN 57* 32* 22 18  --   CREATININE 1.82* 1.17* 1.00 0.82  --   CALCIUM 8.4* 8.4* 7.8* 7.9*  --   PROT 5.6*  --  5.1*  --  4.9*  ALBUMIN 2.9*  --  2.4*  --  2.3*  AST 814*  --  171*  --  89*  ALT 1,524*  --  840*  --  580*  ALKPHOS 33*  --  50  --  45  BILITOT 2.3*  --  1.2  --  1.2  GFRNONAA 25* 43* 52* >60  --   GFRAA 29* 50* >60 >60  --   ANIONGAP 15 12 8 8   --  Hematology Recent Labs  Lab 02/21/18 0440 02/22/18 0511 02/23/18 0530  WBC 10.5 8.6 6.9  RBC 3.41* 3.15* 2.93*  HGB 11.1* 10.8* 9.9*  HCT 33.8* 32.3* 30.5*  MCV 99.1 102.5* 104.1*  MCH 32.6 34.3* 33.8  MCHC 32.8 33.4 32.5  RDW 12.8 13.3 13.4  PLT 124* 152 154    Cardiac Enzymes Recent Labs  Lab 02/18/18 1405 02/18/18 1840 02/18/18 2245 02/19/18 0516  TROPONINI 39.64* >65.00* >65.00* 57.84*    Recent Labs  Lab 02/18/18 1410  TROPIPOC 23.56*     BNPNo results for input(s): BNP, PROBNP in the last 168 hours.   DDimer No results for input(s): DDIMER in the last 168 hours.   Radiology    No results found.  Cardiac Studies     Patient Profile     84 y.o. female with a recent inferior wall myocardial infarction complicated by a right ventricular infarction.  Was complicated by complete heart block and she required a temporary pacemaker.  Assessment & Plan    1.  Inferior wall myocardial infarction/right ventricular infarction  .  She is progressing steadily.  She is a little bit stronger every day. Blood pressure remains marginal but she is on a tiny dose of Levophed.  Will  discontinue the Levophed.  Assuming that she does well we will transfer her later this morning out to the telemetry floor.  2.  Atrial fibrillation:   Converted back to sinus rhythm today. Continue Eliquis 5 mg twice a day.  For questions or updates, please contact [facility] Please consult [email or web address] for contact info under        Signed, Kristeen Miss, MD  02/23/2018, 8:35 AM

## 2018-02-23 NOTE — Progress Notes (Signed)
Physical Therapy Treatment Patient Details Name: Charlene Dickerson MRN: [id number] DOB: 11-Aug-1932 Today's Date: 02/23/2018    History of Present Illness 84 y.o. female with a history of hypertension but no known cardiac history who [facility] after being found by a neighbor with acute onset of altered mental status. She was found to have an inferior STEMI with complete heart block.  Cardiac catheterization December 19 showed an 80% proximal left circumflex, 50% proximal LAD, 99% proximal right coronary artery followed by 100% mid.  Patient had PCI of the RCA    PT Comments    Patient seen for activity progression. Improvements in overall stability today but continues to demonstrate modest LOB at times. HR elevations with ambulation to upper 140s. Rest break required.  Spoke with patient at length regarding desire to go home and mobility expectations during hospital course. Patient initially stating daughter will come provide 24/7 assist at discharge, but at end of session stated that her neighbor would help her. If daughter is able to provide 24/7 then home with HHPT, however, unclear and inconsistent responses as to who will be helping and their availability. If patient does not have 24/7 then post acute rehab may still be most appropriate.   Follow Up Recommendations  Home health PT;Supervision/Assistance - 24 hour(if can confirm daughter to provide 24/7 )     Equipment Recommendations  (TBD)    Recommendations for Other Services Rehab consult     Precautions / Restrictions Precautions Precautions: Fall Precaution Comments: watch VS and BP Restrictions Weight Bearing Restrictions: No RLE Weight Bearing: Non weight bearing    Mobility  Bed Mobility Overal bed mobility: Needs Assistance Bed Mobility: Sit to Supine       Sit to supine: Min assist   General bed mobility comments: min assist for positioning  Transfers Overall transfer level: Needs  assistance Equipment used: Rolling walker (2 wheeled) Transfers: Sit to/from Stand Sit to Stand: Min assist         General transfer comment: Min assist for stability with Vcs for hand placement and positionign  Ambulation/Gait Ambulation/Gait assistance: Min guard;Min assist(mod assist for instability with 3 LOB) Gait Distance (Feet): 280 Feet Assistive device: Rolling walker (2 wheeled) Gait Pattern/deviations: Step-through pattern;Decreased stride length;Shuffle;Trunk flexed Gait velocity: decreased Gait velocity interpretation: <1.31 ft/sec, indicative of household ambulator General Gait Details: Significant improvements in stability, min guard for safety with 2 incidents of min assist for modest LOB. HR elevation to 140s with activity of ambulation 2 standing rest breaks   Stairs             Wheelchair Mobility    Modified Rankin (Stroke Patients Only)       Balance Overall balance assessment: Needs assistance Sitting-balance support: Feet supported Sitting balance-Leahy Scale: Fair   Postural control: Posterior lean Standing balance support: During functional activity;Bilateral upper extremity supported Standing balance-Leahy Scale: Fair Standing balance comment: Rw for support but able to release UE support for functional task today                            Cognition Arousal/Alertness: Awake/alert Behavior During Therapy: WFL for tasks assessed/performed Overall Cognitive Status: Impaired/Different from baseline Area of Impairment: Following commands;Safety/judgement;Awareness;Problem solving                       Following Commands: Follows multi-step commands with increased time Safety/Judgement: Decreased awareness of  safety Awareness: Emergent Problem Solving: Requires verbal cues;Requires tactile cues        Exercises      General Comments        Pertinent Vitals/Pain Pain Assessment: Faces Faces Pain Scale: Hurts  little more Pain Location: calves Pain Descriptors / Indicators: Sore Pain Intervention(s): Monitored during session    Home Living                      Prior Function            PT Goals (current goals can now be found in the care plan section) Acute Rehab PT Goals Patient Stated Goal: to go home PT Goal Formulation: With patient Time For Goal Achievement: 03/08/18 Potential to Achieve Goals: Good Progress towards PT goals: Progressing toward goals    Frequency    Min 3X/week      PT Plan Discharge plan needs to be updated    Co-evaluation              AM-PAC PT "6 Clicks" Mobility   Outcome Measure  Help needed turning from your back to your side while in a flat bed without using bedrails?: None Help needed moving from lying on your back to sitting on the side of a flat bed without using bedrails?: A Little Help needed moving to and from a bed to a chair (including a wheelchair)?: A Little Help needed standing up from a chair using your arms (e.g., wheelchair or bedside chair)?: A Little Help needed to walk in hospital room?: A Lot Help needed climbing 3-5 steps with a railing? : A Lot 6 Click Score: 17    End of Session   Activity Tolerance: Patient tolerated treatment well;Patient limited by fatigue Patient left: in bed;with call bell/phone within reach Nurse Communication: Mobility status PT Visit Diagnosis: Unsteadiness on feet (R26.81);Muscle weakness (generalized) (M62.81);Difficulty in walking, not elsewhere classified (R26.2)     Time: 3335-4562 PT Time Calculation (min) (ACUTE ONLY): 17 min  Charges:  $Gait Training: 8-22 mins                     Charlotte Crumb, PT DPT  Board Certified Neurologic Specialist Acute Rehabilitation Services Pager [phone] Office [phone]    Charlene Dickerson 02/23/2018, 11:57 AM

## 2018-02-23 NOTE — Progress Notes (Signed)
MD aware of hypotension, patient asymptomatic. MD ordered NS bolus.

## 2018-02-23 NOTE — Progress Notes (Signed)
Sitting eating Cardiology weaning off levophed pccm will sign off     SIGNATURE    Dr. Kalman Shan, M.D., F.C.C.P,  Pulmonary and Critical Care Medicine Staff Physician, [facility] Director - Interstitial Lung Disease  Program  Pulmonary Fibrosis [facility] [place], [place], [address]  Pager: [phone], If no answer or between  15:00h - 7:00h: call [phone] Telephone: [phone]  9:12 AM 02/23/2018

## 2018-02-24 LAB — CBC WITH DIFFERENTIAL/PLATELET
Abs Immature Granulocytes: 0.1 10*3/uL — ABNORMAL HIGH (ref 0.00–0.07)
Basophils Absolute: 0 10*3/uL (ref 0.0–0.1)
Basophils Relative: 0 %
Eosinophils Absolute: 0.3 10*3/uL (ref 0.0–0.5)
Eosinophils Relative: 5 %
HCT: 29.6 % — ABNORMAL LOW (ref 36.0–46.0)
Hemoglobin: 9.7 g/dL — ABNORMAL LOW (ref 12.0–15.0)
IMMATURE GRANULOCYTES: 2 %
Lymphocytes Relative: 13 %
Lymphs Abs: 0.8 10*3/uL (ref 0.7–4.0)
MCH: 33.8 pg (ref 26.0–34.0)
MCHC: 32.8 g/dL (ref 30.0–36.0)
MCV: 103.1 fL — ABNORMAL HIGH (ref 80.0–100.0)
Monocytes Absolute: 0.9 10*3/uL (ref 0.1–1.0)
Monocytes Relative: 15 %
NEUTROS PCT: 65 %
NRBC: 0.3 % — AB (ref 0.0–0.2)
Neutro Abs: 3.8 10*3/uL (ref 1.7–7.7)
Platelets: 147 10*3/uL — ABNORMAL LOW (ref 150–400)
RBC: 2.87 MIL/uL — ABNORMAL LOW (ref 3.87–5.11)
RDW: 13.6 % (ref 11.5–15.5)
WBC: 5.9 10*3/uL (ref 4.0–10.5)

## 2018-02-24 LAB — PHOSPHORUS: Phosphorus: 1.7 mg/dL — ABNORMAL LOW (ref 2.5–4.6)

## 2018-02-24 LAB — MAGNESIUM: Magnesium: 1.9 mg/dL (ref 1.7–2.4)

## 2018-02-24 MED ORDER — NITROGLYCERIN 0.4 MG SL SUBL: 0.4000 mg | SUBLINGUAL_TABLET | SUBLINGUAL | 3 refills | Status: DC | PRN

## 2018-02-24 MED ORDER — PANTOPRAZOLE SODIUM 40 MG PO TBEC: 40.0000 mg | DELAYED_RELEASE_TABLET | Freq: Every day | ORAL | 0 refills | Status: DC

## 2018-02-24 MED ORDER — ASPIRIN 81 MG PO CHEW: 81.0000 mg | CHEWABLE_TABLET | Freq: Every day | ORAL | Status: DC

## 2018-02-24 MED ORDER — CLOPIDOGREL BISULFATE 75 MG PO TABS: 75.0000 mg | ORAL_TABLET | Freq: Every day | ORAL | 5 refills | Status: DC

## 2018-02-24 MED ORDER — ATORVASTATIN CALCIUM 40 MG PO TABS: 40.0000 mg | ORAL_TABLET | Freq: Every day | ORAL | 5 refills | Status: DC

## 2018-02-24 MED ORDER — APIXABAN 5 MG PO TABS: 5.0000 mg | ORAL_TABLET | Freq: Two times a day (BID) | ORAL | 5 refills | Status: DC

## 2018-02-24 NOTE — Progress Notes (Signed)
Progress Note  Patient Name: Charlene Dickerson Date of Encounter: 02/24/2018  Primary Cardiologist: Jodelle RedBridgette Christopher, MD   Subjective   84 year old female with a recent inferior wall myocardial infarction complicated by heart block.  She had a temporary pacing.  She also had an RV infarction.  She is gradually improved.  Feeling much stronger today  BP has been marginal but stable She is asymptomatic    Inpatient Medications    Scheduled Meds: . apixaban  5 mg Oral BID  . aspirin  81 mg Oral Daily  . atorvastatin  40 mg Oral q1800  . clopidogrel  75 mg Oral Daily  . docusate sodium  100 mg Oral Daily  . feeding supplement  1 Container Oral TID BM  . latanoprost  1 drop Both Eyes QHS  . pantoprazole  40 mg Oral QHS  . propranolol  10 mg Oral BID  . sodium chloride flush  3 mL Intravenous Q12H   Continuous Infusions: . sodium chloride     PRN Meds: sodium chloride, naphazoline-glycerin, ondansetron (ZOFRAN) IV, polyethylene glycol, sodium chloride flush   Vital Signs    Vitals:   02/24/18 0000 02/24/18 0400 02/24/18 0500 02/24/18 0729  BP: (!) 102/59 97/67 93/70  99/60  Pulse: 66 60 66 80  Resp: (!) 24 (!) 21 17 19   Temp:    98.1 F (36.7 C)  TempSrc:    Oral  SpO2: 99% 99% 100% 100%  Weight:      Height:        Intake/Output Summary (Last 24 hours) at 02/24/2018 0858 Last data filed at 02/23/2018 2000 Gross per 24 hour  Intake 987.77 ml  Output 100 ml  Net 887.77 ml   Filed Weights   02/18/18 1500  Weight: 68 kg    Telemetry    NSR -  Personally Reviewed  ECG     Physical Exam   Physical Exam: Blood pressure 99/60, pulse 80, temperature 98.1 F (36.7 C), temperature source Oral, resp. rate 19, height 5\' 2"  (1.575 m), weight 68 kg, SpO2 100 %.  GEN:  Well nourished, elderly female, no acute distress HEENT: Normal NECK: No JVD; No carotid bruits LYMPHATICS: No lymphadenopathy CARDIAC: RRR   RESPIRATORY:  Clear to auscultation without  rales, wheezing or rhonchi  ABDOMEN: Soft, non-tender, non-distended MUSCULOSKELETAL:  No edema; No deformity  SKIN: Warm and dry NEUROLOGIC:  Alert and oriented x 3    Labs    Chemistry Recent Labs  Lab 02/20/18 0329 02/21/18 0440 02/22/18 0511 02/23/18 0038 02/23/18 0530  NA 134* 136 135 135  --   K 3.5 3.4* 3.6 3.8  --   CL 99 100 101 103  --   CO2 20* 24 26 24   --   GLUCOSE 143* 157* 121* 105*  --   BUN 57* 32* 22 18  --   CREATININE 1.82* 1.17* 1.00 0.82  --   CALCIUM 8.4* 8.4* 7.8* 7.9*  --   PROT 5.6*  --  5.1*  --  4.9*  ALBUMIN 2.9*  --  2.4*  --  2.3*  AST 814*  --  171*  --  89*  ALT 1,524*  --  840*  --  580*  ALKPHOS 33*  --  50  --  45  BILITOT 2.3*  --  1.2  --  1.2  GFRNONAA 25* 43* 52* >60  --   GFRAA 29* 50* >60 >60  --   ANIONGAP 15 12 8 8   --  Hematology Recent Labs  Lab 02/22/18 0511 02/23/18 0530 02/24/18 0240  WBC 8.6 6.9 5.9  RBC 3.15* 2.93* 2.87*  HGB 10.8* 9.9* 9.7*  HCT 32.3* 30.5* 29.6*  MCV 102.5* 104.1* 103.1*  MCH 34.3* 33.8 33.8  MCHC 33.4 32.5 32.8  RDW 13.3 13.4 13.6  PLT 152 154 147*    Cardiac Enzymes Recent Labs  Lab 02/18/18 1405 02/18/18 1840 02/18/18 2245 02/19/18 0516  TROPONINI 39.64* >65.00* >65.00* 57.84*    Recent Labs  Lab 02/18/18 1410  TROPIPOC 23.56*     BNPNo results for input(s): BNP, PROBNP in the last 168 hours.   DDimer No results for input(s): DDIMER in the last 168 hours.   Radiology    No results found.  Cardiac Studies     Patient Profile     84 y.o. female with a recent inferior wall myocardial infarction complicated by a right ventricular infarction.  Was complicated by complete heart block and she required a temporary pacemaker.  Assessment & Plan    1.  Inferior wall myocardial infarction/right ventricular infarction  .  She is progressing steadily.  She is a little bit stronger every day. Blood pressure remains marginal but she is on a tiny dose of Levophed.   Will discontinue the Levophed.  She is ambulating without any difficulty. Will ambulate around the halls.  If she is stable she may be discharged today.  She will be discharged on atorvastatin 40 mg a day, Plavix 75 mg a day, aspirin 81 mg a day,  Eliquis 5 mg twice a day Anticipate stopping aspirin in 1 month.  Because her blood pressure has been low we will be stopping her HCTZ and losartan.   She  takes propranolol 10 mg twice a day for a tremor.  We will continue this medication  2.  Atrial fibrillation:   Converted back to sinus rhythm today. Continue Eliquis 5 mg twice a day.  For questions or updates, please contact [facility] Please consult [email or web address] for contact info under        Signed, Kristeen Miss, MD  02/24/2018, 8:58 AM

## 2018-02-24 NOTE — Plan of Care (Signed)
Patient is stable and well for discharge.  Discussed plan with provider and family as well as patient.

## 2018-02-24 NOTE — Plan of Care (Signed)
Problem: Education: Goal: Understanding of CV disease, CV risk reduction, and recovery process will improve 02/24/2018 1223 by Perlie Mayo, RN Outcome: Completed/Met 02/24/2018 1051 by Perlie Mayo, RN Outcome: Adequate for Discharge Goal: Individualized Educational Video(s) 02/24/2018 1223 by Perlie Mayo, RN Outcome: Completed/Met 02/24/2018 1051 by Perlie Mayo, RN Outcome: Adequate for Discharge   Problem: Activity: Goal: Ability to return to baseline activity level will improve 02/24/2018 1223 by Perlie Mayo, RN Outcome: Completed/Met 02/24/2018 1051 by Perlie Mayo, RN Outcome: Not Met (add Reason)   Problem: Cardiovascular: Goal: Ability to achieve and maintain adequate cardiovascular perfusion will improve 02/24/2018 1223 by Perlie Mayo, RN Outcome: Completed/Met 02/24/2018 1051 by Perlie Mayo, RN Outcome: Adequate for Discharge Goal: Vascular access site(s) Level 0-1 will be maintained 02/24/2018 1223 by Perlie Mayo, RN Outcome: Completed/Met 02/24/2018 1051 by Perlie Mayo, RN Outcome: Adequate for Discharge   Problem: Health Behavior/Discharge Planning: Goal: Ability to safely manage health-related needs after discharge will improve 02/24/2018 1223 by Perlie Mayo, RN Outcome: Completed/Met 02/24/2018 1051 by Perlie Mayo, RN Outcome: Adequate for Discharge   Problem: Education: Goal: Understanding of cardiac disease, CV risk reduction, and recovery process will improve 02/24/2018 1223 by Perlie Mayo, RN Outcome: Completed/Met 02/24/2018 1051 by Perlie Mayo, RN Outcome: Adequate for Discharge Goal: Understanding of medication regimen will improve 02/24/2018 1223 by Perlie Mayo, RN Outcome: Completed/Met 02/24/2018 1051 by Perlie Mayo, RN Outcome: Adequate for Discharge Goal: Individualized Educational Video(s) 02/24/2018 1223 by Perlie Mayo, RN Outcome: Completed/Met 02/24/2018 1051 by Perlie Mayo, RN Outcome: Adequate for Discharge   Problem: Activity: Goal: Ability to tolerate increased activity will improve 02/24/2018 1223 by Perlie Mayo, RN Outcome: Completed/Met 02/24/2018 1051 by Perlie Mayo, RN Outcome: Adequate for Discharge   Problem: Cardiac: Goal: Ability to achieve and maintain adequate cardiopulmonary perfusion will improve 02/24/2018 1223 by Perlie Mayo, RN Outcome: Completed/Met 02/24/2018 1051 by Perlie Mayo, RN Outcome: Adequate for Discharge Goal: Vascular access site(s) Level 0-1 will be maintained 02/24/2018 1223 by Perlie Mayo, RN Outcome: Completed/Met 02/24/2018 1051 by Perlie Mayo, RN Outcome: Adequate for Discharge   Problem: Health Behavior/Discharge Planning: Goal: Ability to safely manage health-related needs after discharge will improve 02/24/2018 1223 by Perlie Mayo, RN Outcome: Completed/Met 02/24/2018 1051 by Perlie Mayo, RN Outcome: Adequate for Discharge   Problem: Activity: Goal: Ability to tolerate increased activity will improve 02/24/2018 1223 by Perlie Mayo, RN Outcome: Completed/Met 02/24/2018 1051 by Perlie Mayo, RN Outcome: Adequate for Discharge   Problem: Education: Goal: Knowledge of cardiac device and self-care will improve 02/24/2018 1223 by Perlie Mayo, RN Outcome: Completed/Met 02/24/2018 1051 by Perlie Mayo, RN Outcome: Adequate for Discharge Goal: Ability to safely manage health related needs after discharge will improve 02/24/2018 1223 by Perlie Mayo, RN Outcome: Completed/Met 02/24/2018 1051 by Perlie Mayo, RN Outcome: Adequate for Discharge Goal: Individualized Educational Video(s) 02/24/2018 1223 by Perlie Mayo, RN Outcome: Completed/Met 02/24/2018 1051 by Perlie Mayo, RN Outcome: Adequate for Discharge   Problem: Cardiac: Goal: Ability to achieve and maintain adequate cardiopulmonary perfusion will improve 02/24/2018 1223 by  Perlie Mayo, RN Outcome: Completed/Met 02/24/2018 1051 by Perlie Mayo, RN Outcome: Adequate for Discharge   Problem: Education: Goal: Knowledge of General Education information will improve Description Including pain rating scale, medication(s)/side effects and non-pharmacologic comfort measures 02/24/2018 1223 by  Perlie Mayo, RN Outcome: Completed/Met 02/24/2018 1051 by Perlie Mayo, RN Outcome: Adequate for Discharge   Problem: Health Behavior/Discharge Planning: Goal: Ability to manage health-related needs will improve 02/24/2018 1223 by Perlie Mayo, RN Outcome: Completed/Met 02/24/2018 1051 by Perlie Mayo, RN Outcome: Adequate for Discharge   Problem: Clinical Measurements: Goal: Ability to maintain clinical measurements within normal limits will improve 02/24/2018 1223 by Perlie Mayo, RN Outcome: Completed/Met 02/24/2018 1051 by Perlie Mayo, RN Outcome: Adequate for Discharge Goal: Will remain free from infection 02/24/2018 1223 by Perlie Mayo, RN Outcome: Completed/Met 02/24/2018 1051 by Perlie Mayo, RN Outcome: Adequate for Discharge Goal: Diagnostic test results will improve 02/24/2018 1223 by Perlie Mayo, RN Outcome: Completed/Met 02/24/2018 1051 by Perlie Mayo, RN Outcome: Adequate for Discharge Goal: Respiratory complications will improve 02/24/2018 1223 by Perlie Mayo, RN Outcome: Completed/Met 02/24/2018 1051 by Perlie Mayo, RN Outcome: Adequate for Discharge Goal: Cardiovascular complication will be avoided 02/24/2018 1223 by Perlie Mayo, RN Outcome: Completed/Met 02/24/2018 1051 by Perlie Mayo, RN Outcome: Adequate for Discharge   Problem: Activity: Goal: Risk for activity intolerance will decrease 02/24/2018 1223 by Perlie Mayo, RN Outcome: Completed/Met 02/24/2018 1051 by Perlie Mayo, RN Outcome: Adequate for Discharge   Problem: Nutrition: Goal: Adequate nutrition will be  maintained 02/24/2018 1223 by Perlie Mayo, RN Outcome: Completed/Met 02/24/2018 1051 by Perlie Mayo, RN Outcome: Adequate for Discharge   Problem: Coping: Goal: Level of anxiety will decrease 02/24/2018 1223 by Perlie Mayo, RN Outcome: Completed/Met 02/24/2018 1051 by Perlie Mayo, RN Outcome: Adequate for Discharge   Problem: Elimination: Goal: Will not experience complications related to bowel motility 02/24/2018 1223 by Perlie Mayo, RN Outcome: Completed/Met 02/24/2018 1051 by Perlie Mayo, RN Outcome: Adequate for Discharge Goal: Will not experience complications related to urinary retention 02/24/2018 1223 by Perlie Mayo, RN Outcome: Completed/Met 02/24/2018 1051 by Perlie Mayo, RN Outcome: Adequate for Discharge   Problem: Pain Managment: Goal: General experience of comfort will improve 02/24/2018 1223 by Perlie Mayo, RN Outcome: Completed/Met 02/24/2018 1051 by Perlie Mayo, RN Outcome: Adequate for Discharge   Problem: Safety: Goal: Ability to remain free from injury will improve 02/24/2018 1223 by Perlie Mayo, RN Outcome: Completed/Met 02/24/2018 1051 by Perlie Mayo, RN Outcome: Adequate for Discharge   Problem: Skin Integrity: Goal: Risk for impaired skin integrity will decrease 02/24/2018 1223 by Perlie Mayo, RN Outcome: Completed/Met 02/24/2018 1051 by Perlie Mayo, RN Outcome: Adequate for Discharge

## 2018-02-24 NOTE — Discharge Summary (Addendum)
Discharge Summary    Patient ID: Charlene Dickerson MRN: [id number]; DOB: 10-18-1932  Admit date: 02/18/2018 Discharge date: 02/24/2018  Primary Care Provider: Martha Clan, MD  Primary Cardiologist: Dr. Elease Hashimoto Primary Electrophysiologist:  None   Discharge Diagnoses    Active Problems:   STEMI (ST elevation myocardial infarction) [facility])   Acute myocardial infarction [facility])   Complete heart block (HCC)   Hemodynamic instability   Central venous catheter in place   Shock circulatory (HCC)   Allergies Allergies  Allergen Reactions  . Clindamycin/Lincomycin Diarrhea    "c-diff"    Diagnostic Studies/Procedures    LHC 02/18/18 Procedures   CORONARY STENT INTERVENTION  LEFT HEART CATH AND CORONARY ANGIOGRAPHY  TEMPORARY PACEMAKER  Conclusion     Prox Cx to Mid Cx lesion is 80% stenosed.  Prox LAD to Mid LAD lesion is 50% stenosed.  Prox RCA lesion is 99% stenosed.  Mid RCA to Dist RCA lesion is 100% stenosed.  A drug-eluting stent was successfully placed.  Post intervention, there is a 0% residual stenosis.  A stent was successfully placed.  Post intervention, there is a 0% residual stenosis.     2D Echo 02/19/18 Study Conclusions  - Left ventricle: The cavity size was normal. Wall thickness was   normal. Systolic function was normal. The estimated ejection   fraction was in the range of 50% to 55%. There is hypokinesis of   the basalinferior myocardium. Doppler parameters are consistent   with high ventricular filling pressure. - Ventricular septum: The contour showed diastolic flattening and   systolic flattening. - Aortic valve: There was mild regurgitation. - Mitral valve: Calcified annulus. - Right ventricle: The cavity size was moderately dilated. Systolic   function was severely reduced. - Right atrium: The atrium was severely dilated. - Atrial septum: There was an atrial septal aneurysm. - Pulmonary arteries: PA peak pressure: 39 mm Hg  (S).  Impressions:  - Inferobasal hypokinesis with overall low normal LV systolic   function; mild AI; moderate RVE with severe RV dysfunction;   severe RAE; mild TR.   Coronary Diagrams   Diagnostic  Dominance: Right    Intervention       History of Present Illness / Hospital Course    Charlene Dickerson is a 84 y.o. female with a history of hypertension but no known cardiac history who presented to the [facility] ED on 02/18/18 via EMS after being found by a neighbor with acute onset of altered mental status. She was found to have an inferior STEMI with complete heart block.   ED Course:  Patient was severely bradycardic and hypotensive in the ED with heart rates in the 30's and systolic BP in the 60's. EKG showed inferior STEMI with complete heart block.  Initial troponin 23.56. Lactic acid 5.37. Na 130, K 5.0, Glucose 100, SCr 4.00. Patient was started on transcutaneous pacer and was given pressors. Systolic BP improved to as high as the 160's once patient was being paced. She was intubated by Dr. Erma Heritage. She was given Aspirin suppository and Heparin bolus. Patient then taken to cath lab for emergency cardiac catheterization and temporary pacing wire.   Her heart rate improved with placement of a temporary transvenous pacemaker. Cath showed 3V CAD but the RCA was the culprit. There was 99% proximal RCA disease and 100% mid RCA occlusion. Both the LAD and LCx had moderate disease, treated medically (50% prox-mid LAD and 80% prox to mid Cx). Her RCA intervention was difficult because  of the proximal lesion which was highly calcified and resistant to balloon dilatation as well as extensive thrombus throughout the remainder of the vessel.  Ultimately Dr. Allyson Sabal was able to restore antegrade flow with placement of 3 stents and aggressive angioplasty.  She was placed on Aggrastat drip. She left the cath lab in stable condition. 2D Echo showed normal LVEF with inferior hypokinesis.   Post PCI,  her CHB resolved and temporary pacer was removed. She did develop atrial fibrillation but rates were controlled. She require addition of Eliquis, 5 mg BID, for stroke prophylaxis. She was also treated w/ ASA and Plavix for newly placed DES. Plan is to stop ASA after 30 days. PPI was added for GI protection. She was also treated with high dose statin, Lipitor. She had issues with low BP and required Levophed, but this was ultimately stopped. BP stabilized but remained soft. Subsequently, her losartan and HCTZ were discontinued. She was however continued on propanolol, which she takes for resting tremor. Pt otherwise had no other major issues. She had no CP or dyspnea post PCI. She ambulated w/ nursing staff prior to discharge and did well w/o difficulty. She was last seen and examined by Dr. Melburn Popper who determined she was stable for discharge home.    Consultants: none    Discharge Vitals Blood pressure 99/60, pulse 80, temperature 98.1 F (36.7 C), temperature source Oral, resp. rate 19, height 5\' 2"  (1.575 m), weight 68 kg, SpO2 100 %.  Filed Weights   02/18/18 1500  Weight: 68 kg    Labs & Radiologic Studies    CBC Recent Labs    02/23/18 0530 02/24/18 0240  WBC 6.9 5.9  NEUTROABS 4.5 3.8  HGB 9.9* 9.7*  HCT 30.5* 29.6*  MCV 104.1* 103.1*  PLT 154 147*   Basic Metabolic Panel Recent Labs    16/10/96 0511 02/23/18 0038 02/23/18 0530 02/24/18 0240  NA 135 135  --   --   K 3.6 3.8  --   --   CL 101 103  --   --   CO2 26 24  --   --   GLUCOSE 121* 105*  --   --   BUN 22 18  --   --   CREATININE 1.00 0.82  --   --   CALCIUM 7.8* 7.9*  --   --   MG  --   --  1.9 1.9  PHOS  --   --  1.8* 1.7*   Liver Function Tests Recent Labs    02/22/18 0511 02/23/18 0530  AST 171* 89*  ALT 840* 580*  ALKPHOS 50 45  BILITOT 1.2 1.2  PROT 5.1* 4.9*  ALBUMIN 2.4* 2.3*   No results for input(s): LIPASE, AMYLASE in the last 72 hours. Cardiac Enzymes No results for input(s):  CKTOTAL, CKMB, CKMBINDEX, TROPONINI in the last 72 hours. BNP Invalid input(s): POCBNP D-Dimer No results for input(s): DDIMER in the last 72 hours. Hemoglobin A1C No results for input(s): HGBA1C in the last 72 hours. Fasting Lipid Panel No results for input(s): CHOL, HDL, LDLCALC, TRIG, CHOLHDL, LDLDIRECT in the last 72 hours. Thyroid Function Tests No results for input(s): TSH, T4TOTAL, T3FREE, THYROIDAB in the last 72 hours.  Invalid input(s): FREET3 _____________  Dg Chest Port 1 View  Result Date: 02/20/2018 CLINICAL DATA:  Central line placement EXAM: PORTABLE CHEST 1 VIEW COMPARISON:  One day prior FINDINGS: Right-sided femoral line terminating over the right ventricle. Placement of a right internal jugular  line with tip at high right atrium. Numerous leads and wires project over the chest. Midline trachea. Mild cardiomegaly. Small left pleural effusion. No pneumothorax. Resolution of pulmonary venous congestion. Persistent mild left base airspace disease. IMPRESSION: 1. Placement of a right internal jugular line with tip at high right atrium. No pneumothorax. 2. Cardiomegaly with resolution of pulmonary venous congestion. 3. Persistent small left pleural effusion with adjacent atelectasis. Electronically Signed   By: Jeronimo Greaves M.D.   On: 02/20/2018 21:42   Dg Chest Port 1 View  Result Date: 02/19/2018 CLINICAL DATA:  Status post intravenous line placement. EXAM: PORTABLE CHEST 1 VIEW COMPARISON:  Portable chest x-ray of February 18, 2018 FINDINGS: No PICC line or internal jugular or subclavian catheter is observed. There is a tubular structure that appears new that may reflect a femoral venous catheter. Its tip appears to overlie the electrode connection for 1 of the external pacemaker defibrillator pads. This may be in the right atrium. The trachea and esophagus have been extubated. There is a small left pleural effusion. The cardiac silhouette is top-normal in size. The pulmonary  vascularity is not engorged. The bony thorax exhibits no acute abnormality. IMPRESSION: Patient may have undergone placement of a femoral venous catheter. The tip is difficult to localized precisely but it may lie in the right ventricle. Correlation with the type of venous catheter placed and the desire target location of its tip is the. Small left pleural effusion. Stable cardiomegaly without pulmonary vascular congestion. Electronically Signed   By: David  [place] M.D.   On: 02/19/2018 14:37   Dg Chest Portable 1 View  Result Date: 02/18/2018 CLINICAL DATA:  84 y/o  F; status post intubation and STEMI. EXAM: PORTABLE CHEST 1 VIEW COMPARISON:  11/26/2007 chest radiograph. FINDINGS: Stable cardiac silhouette given projection and technique. Transcutaneous pacing pads noted. Endotracheal tube tip is just above the carina, the area is obscured by electronic device. Enteric tube tip projects over the proximal stomach. Pulmonary vascular congestion. No consolidation, effusion, or pneumothorax. No acute osseous abnormality is evident. IMPRESSION: Endotracheal tube tip just above the carina, the area is obscured by electronic device, consider repeat radiographs. Enteric tube tip projects over proximal stomach. Pulmonary vascular congestion. Electronically Signed   By: Mitzi Hansen M.D.   On: 02/18/2018 14:40   [place] Ekg Site Rite  Result Date: 02/20/2018 If Site Rite image not attached, placement could not be confirmed due to current cardiac rhythm.  Disposition   Pt is being discharged home today in good condition.  Follow-up Plans & Appointments    Follow-up Information    Kashawn Manzano, Deloris Ping, MD Follow up.   Specialty:  Cardiology Why:  our office will call you with a hospital follow-up visit  Contact information: [address] [place] [address] [phone]          Discharge Instructions    Amb Referral to Cardiac Rehabilitation   Complete by:  As directed     Diagnosis:   Coronary Stents PTCA STEMI     Diet - low sodium heart healthy   Complete by:  As directed    Increase activity slowly   Complete by:  As directed       Discharge Medications   Allergies as of 02/24/2018      Reactions   Clindamycin/lincomycin Diarrhea   "c-diff"      Medication List    STOP taking these medications   ALPRAZolam 0.5 MG tablet Commonly known as:  XANAX   aspirin 325 MG EC tablet Replaced by:  aspirin 81 MG chewable tablet   hydrochlorothiazide 25 MG tablet Commonly known as:  HYDRODIURIL   losartan 100 MG tablet Commonly known as:  COZAAR     TAKE these medications   acetaminophen 500 MG tablet Commonly known as:  TYLENOL Take 1,000 mg by mouth as needed for mild pain.   apixaban 5 MG Tabs tablet Commonly known as:  ELIQUIS Take 1 tablet (5 mg total) by mouth 2 (two) times daily.   aspirin 81 MG chewable tablet Chew 1 tablet (81 mg total) by mouth daily. Start taking on:  February 25, 2018 Replaces:  aspirin 325 MG EC tablet   atorvastatin 40 MG tablet Commonly known as:  LIPITOR Take 1 tablet (40 mg total) by mouth daily at 6 PM.   calcium citrate 950 MG tablet Commonly known as:  CALCITRATE - dosed in mg elemental calcium Take 200 mg of elemental calcium by mouth daily.   clopidogrel 75 MG tablet Commonly known as:  PLAVIX Take 1 tablet (75 mg total) by mouth daily.   fluticasone 50 MCG/ACT nasal spray Commonly known as:  FLONASE Place 1 spray into both nostrils as needed for allergies or rhinitis.   latanoprost 0.005 % ophthalmic solution Commonly known as:  XALATAN Place 1 drop into both eyes at bedtime.   loratadine 10 MG tablet Commonly known as:  CLARITIN Take 10 mg by mouth daily.   multivitamin with minerals Tabs tablet Take 1 tablet by mouth daily.   nitroGLYCERIN 0.4 MG SL tablet Commonly known as:  NITROSTAT Place 1 tablet (0.4 mg total) under the tongue every 5 (five) minutes as needed for chest  pain.   pantoprazole 40 MG tablet Commonly known as:  PROTONIX Take 1 tablet (40 mg total) by mouth at bedtime.   PEPCID AC 10 MG tablet Generic drug:  famotidine Take 10 mg by mouth as needed for heartburn or indigestion.   propranolol 10 MG tablet Commonly known as:  INDERAL Take 10 mg by mouth 2 (two) times daily. (2) 1 times daily   simethicone 80 MG chewable tablet Commonly known as:  MYLICON Chew 160 mg by mouth every 6 (six) hours as needed for flatulence.   Vitamin D (Ergocalciferol) 1.25 MG (50000 UT) Caps capsule Commonly known as:  DRISDOL Take 1 capsule by mouth once a week.        Acute coronary syndrome (MI, NSTEMI, STEMI, etc) this admission?: Yes.     AHA/ACC Clinical Performance & Quality Measures: 1. Aspirin prescribed? - Yes 2. ADP Receptor Inhibitor (Plavix/Clopidogrel, Brilinta/Ticagrelor or Effient/Prasugrel) prescribed (includes medically managed patients)? - Yes 3. Beta Blocker prescribed? - Yes 4. High Intensity Statin (Lipitor 40-80mg  or Crestor 20-40mg ) prescribed? - Yes 5. EF assessed during THIS hospitalization? - Yes 6. For EF <40%, was ACEI/ARB prescribed? - No - Reason:  hypotension 7. For EF <40%, Aldosterone Antagonist (Spironolactone or Eplerenone) prescribed? - Not Applicable (EF >/= 40%) 8. Cardiac Rehab Phase II ordered (Included Medically managed Patients)? - Yes     Outstanding Labs/Studies   FLP and HFTs 6-8 weeks, LDL goal < 70   Duration of Discharge Encounter   Greater than 30 minutes including physician time.  Signed, Robbie Lis, PA-C 02/24/2018, 11:17 AM  Attending Note:   The patient was seen and examined.  Agree with assessment and plan as noted above.  Changes made to the above note as needed.  Patient seen and independently examined  with Robbie LisBrittainy Simmons, PA .   We discussed all aspects of the encounter. I agree with the assessment and plan as stated above.  1.  Status post inferior wall myocardial  infarction complicated by right ventricular infarction: Charlene Dickerson seems to be doing better.  She has had hypertension for several days.  She required Levophed for several days but now is slowly improving.  I have encouraged her to stay well-hydrated. Hold her hydrochlorothiazide and losartan.  The myocardial infarction was also complicated by transient complete heart block.  She required a temporary pacer transiently but her AV conduction improved.  We will see her back in the office in several weeks.   I have spent a total of 40 minutes with patient reviewing hospital  notes , telemetry, EKGs, labs and examining patient as well as establishing an assessment and plan that was discussed with the patient. > 50% of time was spent in direct patient care.    Vesta MixerPhilip J. Meyer Arora, Montez HagemanJr., MD, Jefferson Medical CenterFACC 02/25/2018, 2:09 PM [address],  [address] Office [phone] Pager [phone]

## 2018-02-24 NOTE — Discharge Instructions (Signed)
You will only need to take aspirin (81 mg) for 1 month. You can stop ASA after 03/21/2018 (1 month from day of stent placement).

## 2018-02-25 ENCOUNTER — Telehealth: Payer: Self-pay | Admitting: Nurse Practitioner

## 2018-02-25 ENCOUNTER — Telehealth (HOSPITAL_COMMUNITY): Payer: Self-pay

## 2018-02-25 LAB — CALCIUM, IONIZED: CALCIUM, IONIZED, SERUM: 4.3 mg/dL — AB (ref 4.5–5.6)

## 2018-02-25 NOTE — Telephone Encounter (Signed)
Pt insurance is active and benefits verified through [facility]. Co-pay $0.00, DED $290.00/$290.00 met, out of pocket $3,290.00/$1,303.71 met, co-insurance 20%. No pre-authorization required. Passport, 02/25/18 @ 3:56PM, REF# [id number]  Will contact patient to see if she is interested in the Cardiac Rehab Program. If interested, patient will need to complete follow up appt. Once completed, patient will be contacted for scheduling upon review by the RN Navigator.

## 2018-02-25 NOTE — Telephone Encounter (Signed)
Called and spoke to pt daughter [name] who stated pt was sleeping and she will have her return our phone call once she wake up. No follow up appt on file, place paperwork in "no f/u appt" folder.

## 2018-02-25 NOTE — Telephone Encounter (Signed)
-----   Message from April Garrison, [place] sent at 02/25/2018  4:59 PM EST ----- Pt needs TOC f/u in 7-14 days w/ APP or Dr. Elease Hashimoto

## 2018-02-25 NOTE — Telephone Encounter (Signed)
Patient contacted regarding discharge from [facility] on 02/24/18.  Patient understands to follow up with provider Dr. Elease Hashimoto on 03/12/18 at 8:20 at [facility]. Patient understands discharge instructions? Yes Patient understands medications and regiment? Yes Patient understands to bring all medications to this visit? Yes  Received permission from patient to speak with daughter, Wende Mott who is at home with the patient. She denies complaints except mild leg pain from cath site. She is giving patient Tylenol 1000 mg for pain. I advised her of cath site changes to watch for and to call our office with any questions or concerns. She verbalized understanding and agreement and thanked me for the call.

## 2018-02-26 ENCOUNTER — Telehealth: Payer: Self-pay | Admitting: Cardiovascular Disease

## 2018-02-26 MED ORDER — POTASSIUM CHLORIDE CRYS ER 10 MEQ PO TBCR: 10.0000 meq | EXTENDED_RELEASE_TABLET | Freq: Two times a day (BID) | ORAL | 6 refills | Status: DC

## 2018-02-26 NOTE — Telephone Encounter (Signed)
Pt gave verbal permission to speak with daughter Merlene Laughter.  Per Merlene Laughter pt is having swelling at feet and ankles.  Pt denies SOB and has not been weighing but feels like she is "filling up with fluid."  During pt's recent hospitalization both her HCTZ and xanax were stopped d/t soft BP.  Fm has been checking her BP three times a day and her last one was at 1 pm 112/63.  They are asking if she can 1) restart HCTZ 25 mg daily for her edema and 2) if she can restart Xanax at bedtime as she has been having some anxiety and usually takes this at bedtime for sleep.  Advised I will forward this information to Dr Elease Hashimoto for review and will c/b with his orders.  Both pt and daughter state understanding.

## 2018-02-26 NOTE — Telephone Encounter (Signed)
Yes, We held the HCTZ and Losartan due to marked hypotension due to her RV infarction Lets restart the HCTZ 25 mg a day Also add Kdur 10 meq a day  Please check a BMP in 1-2 weeks

## 2018-02-26 NOTE — Telephone Encounter (Signed)
Follow up   Pt daughter just wants to update phone number for nurse to call her back [phone]

## 2018-02-26 NOTE — Telephone Encounter (Signed)
Pt and daughter both are aware OK to restart HCTZ 25 mg daily.  Aware to start potassium chloride 10 MEQ once daily and to have blood work in 1 -2 weeks per Dr Elease Hashimoto.  RX sent into [facility].  Patient will have blood draw at f/u appt 1/10 with Dr Elease Hashimoto.  They were greatly appreciative of the call and information.

## 2018-02-26 NOTE — Telephone Encounter (Signed)
New Message   Pt c/o swelling: STAT is pt has developed SOB within 24 hours  1) How much weight have you gained and in what time span? Unknown, patient is not weighed daily   2) If swelling, where is the swelling located? Feet and ankles (but the foot is so swollen that the ankles can not be seen and the feet are cold to the touch)  3) Are you currently taking a fluid pill? No, the hydralazine was stopped Are you currently SOB? No  4) Do you have a log of your daily weights (if so, list)? No   5) Have you gained 3 pounds in a day or 5 pounds in a week? Unknown   6) Have you traveled recently? No    Also wants to know can patient start xanax again or something similar and if she needs to restart the fluid pill. Patients daughter [name] is calling. Also the patient is complaining of pain in the groin and she has been given her 500mg  of tylenol every 6 hours and that seems to help as well. She wants to make sure that the Tylenol is not contributing to the fluid retention.

## 2018-03-12 ENCOUNTER — Ambulatory Visit (INDEPENDENT_AMBULATORY_CARE_PROVIDER_SITE_OTHER): Payer: Medicare Other | Admitting: Cardiovascular Disease

## 2018-03-12 ENCOUNTER — Encounter: Payer: Self-pay | Admitting: Cardiovascular Disease

## 2018-03-12 VITALS — BP 116/72 | HR 40 | Ht 62.0 in | Wt 151.0 lb

## 2018-03-12 DIAGNOSIS — I2111 ST elevation (STEMI) myocardial infarction involving right coronary artery: Secondary | ICD-10-CM | POA: Diagnosis not present

## 2018-03-12 DIAGNOSIS — I4819 Other persistent atrial fibrillation: Secondary | ICD-10-CM | POA: Diagnosis not present

## 2018-03-12 NOTE — Progress Notes (Signed)
Cardiology Office Note:    Date:  03/12/2018   ID:  Charlene Dickerson, DOB 15-Oct-1932, MRN [id number]  PCP:  Martha Clan, MD  Cardiologist:  Kristeen Miss, MD  Electrophysiologist:  None   Referring MD: Martha Clan, MD   Chief Complaint  Patient presents with  . Coronary Artery Disease  . Atrial Fibrillation     Jan. 10, 2020   Charlene Dickerson is a 85 y.o. female with a recent admission to the hospital with an acute inferior wall myocardial infarction.  Was complicated by right ventricular infarction.  Was seen with [name], ( daughter)   She initially had lots of problems with hypotension and required Levophed for several days while in hospital.  But has made a great recovery.   Also had paroxysmal atrial fibrillation as a complication of her heart attack.  She converted back to sinus rhythm on December 24    She is now feeling quite a bit better. No syncope or presyncope.  She is not had any episodes of angina.     Past Medical History:  Diagnosis Date  . Diverticulosis of colon   . Essential tremor   . GERD (gastroesophageal reflux disease)    occasional (no meds)  . History of basal cell carcinoma excision 2012  . Hypertension   . Osteoporosis   . PONV (postoperative nausea and vomiting)   . Rectocele    symptomatic    Past Surgical History:  Procedure Laterality Date  . BREAST EXCISIONAL BIOPSY Left 1995  . BREAST EXCISIONAL BIOPSY Right 1988  . CATARACT EXTRACTION W/ INTRAOCULAR LENS  IMPLANT, BILATERAL  right 09/ 2013;  left 10/ 2013  . COLONOSCOPY  last one 04/10/ 2012  . CORONARY STENT INTERVENTION N/A 02/18/2018   Procedure: CORONARY STENT INTERVENTION;  Surgeon: Runell Gess, MD;  Location: [facility]IVE [facility];  Service: Cardiovascular;  Laterality: N/A;  . D & C HYSTEROSCOPY W/ RESECTION ENDOMETRIAL POLYP AND LESION  07-27-2007   dr Ambrose Mantle @[facility]   . LEFT HEART CATH AND CORONARY ANGIOGRAPHY N/A 02/18/2018   Procedure: LEFT HEART CATH AND CORONARY  ANGIOGRAPHY;  Surgeon: Runell Gess, MD;  Location: [facility]AS[facility] CV [facility];  Service: Cardiovascular;  Laterality: N/A;  . RECTOCELE REPAIR N/A 11/26/2017   Procedure: POSTERIOR REPAIR (RECTOCELE);  Surgeon: Tracey Harries, MD;  Location: [facility];  Service: Gynecology;  Laterality: N/A;  OUTPT IN BED  . TEMPORARY PACEMAKER N/A 02/18/2018   Procedure: TEMPORARY PACEMAKER;  Surgeon: Runell Gess, MD;  Location: [facility] INVASIVE CV LAB;  Service: Cardiovascular;  Laterality: N/A;  . TUBAL LIGATION Bilateral yrs ago    Current Medications: Current Meds  Medication Sig  . acetaminophen (TYLENOL) 500 MG tablet Take 1,000 mg by mouth as needed for mild pain.  Marland Kitchen apixaban (ELIQUIS) 5 MG TABS tablet Take 1 tablet (5 mg total) by mouth 2 (two) times daily.  Marland Kitchen aspirin 81 MG chewable tablet Chew 1 tablet (81 mg total) by mouth daily.  Marland Kitchen atorvastatin (LIPITOR) 40 MG tablet Take 1 tablet (40 mg total) by mouth daily at 6 PM.  . calcium citrate (CALCITRATE - DOSED IN MG ELEMENTAL CALCIUM) 950 MG tablet Take 200 mg of elemental calcium by mouth daily.  . clopidogrel (PLAVIX) 75 MG tablet Take 1 tablet (75 mg total) by mouth daily.  . famotidine (PEPCID AC) 10 MG tablet Take 10 mg by mouth as needed for heartburn or indigestion.  . fluticasone (FLONASE) 50 MCG/ACT nasal spray Place  1 spray into both nostrils as needed for allergies or rhinitis.  Marland Kitchen latanoprost (XALATAN) 0.005 % ophthalmic solution Place 1 drop into both eyes at bedtime.   Marland Kitchen loratadine (CLARITIN) 10 MG tablet Take 10 mg by mouth daily.  . Multiple Vitamin (MULTIVITAMIN WITH MINERALS) TABS tablet Take 1 tablet by mouth daily.  . nitroGLYCERIN (NITROSTAT) 0.4 MG SL tablet Place 1 tablet (0.4 mg total) under the tongue every 5 (five) minutes as needed for chest pain.  . pantoprazole (PROTONIX) 40 MG tablet Take 1 tablet (40 mg total) by mouth at bedtime.  . potassium chloride (K-DUR,KLOR-CON) 10 MEQ tablet Take 1 tablet (10 mEq  total) by mouth 2 (two) times daily.  . propranolol (INDERAL) 10 MG tablet Take 10 mg by mouth 2 (two) times daily. (2) 1 times daily   . simethicone (MYLICON) 80 MG chewable tablet Chew 160 mg by mouth every 6 (six) hours as needed for flatulence.  . Vitamin D, Ergocalciferol, (DRISDOL) 1.25 MG (50000 UT) CAPS capsule Take 1 capsule by mouth once a week.     Allergies:   Clindamycin/lincomycin   Social History   Socioeconomic History  . Marital status: Widowed    Spouse name: Not on file  . Number of children: Not on file  . Years of education: Not on file  . Highest education level: Not on file  Occupational History  . Not on file  Social Needs  . Financial resource strain: Not on file  . Food insecurity:    Worry: Not on file    Inability: Not on file  . Transportation needs:    Medical: Not on file    Non-medical: Not on file  Tobacco Use  . Smoking status: Former Smoker    Last attempt to quit: 05/08/1961    Years since quitting: 56.8  . Smokeless tobacco: Never Used  Substance and Sexual Activity  . Alcohol use: No  . Drug use: Not on file  . Sexual activity: Not on file  Lifestyle  . Physical activity:    Days per week: Not on file    Minutes per session: Not on file  . Stress: Not on file  Relationships  . Social connections:    Talks on phone: Not on file    Gets together: Not on file    Attends religious service: Not on file    Active member of club or organization: Not on file    Attends meetings of clubs or organizations: Not on file    Relationship status: Not on file  Other Topics Concern  . Not on file  Social History Narrative  . Not on file     Family History: The patient's family history includes Breast cancer in her daughter, maternal aunt, maternal grandmother, and sister; Cancer in her daughter, maternal aunt, maternal grandmother, and sister; Heart disease in her father and sister.  ROS:   Please see the history of present illness.      All other systems reviewed and are negative.  EKGs/Labs/Other Studies Reviewed:    The following studies were reviewed today:   EKG:    Jan. 10, 2020 Atrial fib with HR of 91.    Inf. MI   Recent Labs: 02/23/2018: ALT 580; BUN 18; Creatinine, Ser 0.82; Potassium 3.8; Sodium 135 02/24/2018: Hemoglobin 9.7; Magnesium 1.9; Platelets 147  Recent Lipid Panel    Component Value Date/Time   CHOL 168 02/18/2018 1405   TRIG 70 02/18/2018 1405   HDL 62  02/18/2018 1405   CHOLHDL 2.7 02/18/2018 1405   VLDL 14 02/18/2018 1405   LDLCALC 92 02/18/2018 1405    Physical Exam:    VS:  BP 116/72   Pulse (!) 40   Ht 5\' 2"  (1.575 m)   Wt 151 lb (68.5 kg)   SpO2 92%   BMI 27.62 kg/m     Wt Readings from Last 3 Encounters:  03/12/18 151 lb (68.5 kg)  02/18/18 149 lb 14.6 oz (68 kg)  11/26/17 150 lb 1.6 oz (68.1 kg)     GEN: Elderly female, no acute distress HEENT: Normal NECK: No JVD; No carotid bruits LYMPHATICS: No lymphadenopathy CARDIAC: Irre. Irreg.  RESPIRATORY:  Clear to auscultation without rales, wheezing or rhonchi  ABDOMEN: Soft, non-tender, non-distended MUSCULOSKELETAL:  No edema; No deformity  SKIN: Warm and dry NEUROLOGIC:  Alert and oriented x 3 PSYCHIATRIC:  Normal affect   ASSESSMENT:    No diagnosis found. PLAN:    In order of problems listed above:  1. Inferior wall myocardial infarction-complicated by right ventricular myocardial infarction and atrial fibrillation:  Seems to be making good progress.  Potential has resolved.  Continue Eliquis and Plavix.  2.  Paroxysmal atrial fibrillation: He is gone back into atrial fibrillation.  Continue Eliquis.  Her heart rate is well controlled.   Medication Adjustments/Labs and Tests Ordered: Current medicines are reviewed at length with the patient today.  Concerns regarding medicines are outlined above.  No orders of the defined types were placed in this encounter.  No orders of the defined types were  placed in this encounter.   Patient Instructions  Medication Instructions:  Your physician recommends that you continue on your current medications as directed. Please refer to the Current Medication list given to you today.  If you need a refill on your cardiac medications before your next appointment, please call your pharmacy.   Lab work: None Ordered   Testing/Procedures: None Ordered    Follow-Up: At [facility], you and your health needs are our priority.  As part of our continuing mission to provide you with exceptional heart care, we have created designated Provider Care Teams.  These Care Teams include your primary Cardiologist (physician) and Advanced Practice Providers (APPs -  Physician Assistants and Nurse Practitioners) who all work together to provide you with the care you need, when you need it. You will need a follow up appointment in:  3 months.  Please call our office 2 months in advance to schedule this appointment.  You may see Kristeen Miss, MD or one of the following Advanced Practice Providers on your designated Care Team: Tereso Newcomer, PA-C [place], [place] . Berton Bon, NP       Signed, Kristeen Miss, MD  03/12/2018 11:25 AM    [facility]

## 2018-03-12 NOTE — Patient Instructions (Signed)
Medication Instructions:  Your physician recommends that you continue on your current medications as directed. Please refer to the Current Medication list given to you today.  If you need a refill on your cardiac medications before your next appointment, please call your pharmacy.    Lab work: None Ordered    Testing/Procedures: None Ordered   Follow-Up: At [facility], you and your health needs are our priority.  As part of our continuing mission to provide you with exceptional heart care, we have created designated Provider Care Teams.  These Care Teams include your primary Cardiologist (physician) and Advanced Practice Providers (APPs -  Physician Assistants and Nurse Practitioners) who all work together to provide you with the care you need, when you need it. You will need a follow up appointment in:  3 months.  Please call our office 2 months in advance to schedule this appointment.  You may see Philip Nahser, MD or one of the following Advanced Practice Providers on your designated Care Team: Scott Weaver, PA-C [place], PA-C . Janine Hammond, NP   

## 2018-03-12 NOTE — Addendum Note (Signed)
Addended by: Levi Aland on: 03/12/2018 04:11 PM   Modules accepted: Orders

## 2018-03-17 ENCOUNTER — Telehealth: Payer: Self-pay | Admitting: Cardiovascular Disease

## 2018-03-17 NOTE — Telephone Encounter (Signed)
New Message:    Pt wants to know if she can she light any weight at this time? She is concerned about lifting her cat, which weighs 161/2 lbs. She had stents put in 02-18-18.

## 2018-03-17 NOTE — Telephone Encounter (Signed)
Spoke with patient and advised her that she may lift her cat who weighs 16 1/2 lb. I advised her that she does not have a lifting restriction but to gradually increase the amount of weight she is lifting. She states she is walking daily and feeling well. She thanked me for the call.

## 2018-03-23 ENCOUNTER — Telehealth (HOSPITAL_COMMUNITY): Payer: Self-pay

## 2018-03-23 NOTE — Telephone Encounter (Signed)
Attempted to call patient in regards to Cardiac Rehab - LM on VM 

## 2018-03-25 ENCOUNTER — Telehealth: Payer: Self-pay | Admitting: Cardiovascular Disease

## 2018-03-25 MED ORDER — PANTOPRAZOLE SODIUM 40 MG PO TBEC: 40.0000 mg | DELAYED_RELEASE_TABLET | Freq: Every day | ORAL | 1 refills | Status: DC

## 2018-03-25 MED ORDER — ATORVASTATIN CALCIUM 40 MG PO TABS: 40.0000 mg | ORAL_TABLET | Freq: Every day | ORAL | 1 refills | Status: DC

## 2018-03-25 MED ORDER — CLOPIDOGREL BISULFATE 75 MG PO TABS: 75.0000 mg | ORAL_TABLET | Freq: Every day | ORAL | 1 refills | Status: DC

## 2018-03-25 NOTE — Telephone Encounter (Signed)
Patient called again

## 2018-03-25 NOTE — Telephone Encounter (Signed)
Spoke with patient and asked if she has been collecting BP readings at home. She reports BP today 126/83, pulse 91. States weight yesterday was 149.7 lb, today 151.4 but when she weighed today she was wearing more clothing. I advised her to continue to weigh daily in the same clothing at the same time.  I was explaining the reason for not starting diuretics with her and she states she is taking HCTZ. I reviewed her record and advised that she was supposed to stop HCTZ at the time of her discharge. She has continued to take it daily. I reviewed all of her medications with her and she labeled her bottles at home.  I advised her to be very careful about measuring sodium and to restrict herself to 2000 mg daily. We discussed leg elevation and I offered the loungedoctor as an option for her to look at. She agrees to wear compression stockings daily and to call back Monday if the swelling does not improve. I advised her to call the office number for advice or for the on-call provider if needed prior to Monday. She verbalized understanding and agreement with plan and thanked me for the call.

## 2018-03-25 NOTE — Telephone Encounter (Signed)
Pt c/o swelling: STAT is pt has developed SOB within 24 hours  1) How much weight have you gained and in what time span? Not sure  2) If swelling, where is the swelling located? Feet Ankles and legs  3) Are you currently taking a fluid pill? Yes  4) Are you currently SOB? No   5) Do you have a log of your daily weights (if so, list)? No   6) Have you gained 3 pounds in a day or 5 pounds in a week? 3 maybe   7) Have you traveled recently? no

## 2018-03-25 NOTE — Telephone Encounter (Signed)
Spoke with patient who states her feet began swelling on Sunday. Complains of some redness in bilateral feet. She denies injury or infection. Denies increased sodium in diet, states she is preparing her meals which have been low sodium; states she has had one Healthy [employer] freezer meal. Weights have been: Sunday 151 lb Monday 149 lb Tuesday 149 lb Wednesday 149 lb  States she walked approximately 1/2 mile on 2 occasions this week without SOB.  Denies SOB currently Elevating feet and legs as often as she can; notes improvement in the mornings after being off feet all night. States she has compression stockings but has trouble getting them on; states she can get her family to help her put them on. I advised that I will forward message to Dr. Elease Hashimoto for advice and call her back. I asked if she could upload a picture to her MyChart account and she states she does not know how to do that. She thanked me for my help.

## 2018-03-25 NOTE — Telephone Encounter (Signed)
Has she been measuring her BP ? She has had significant hypotension related to her RV infarction so we have not had her on a diuretic and I had her possibly eating more salt that she should temporarily after this RV infarction Please make sure she starts to cut back on her salt and elevates her legs higher than her heart for 20 - 30 min a day . Compression hose may help

## 2018-03-31 ENCOUNTER — Telehealth: Payer: Self-pay | Admitting: Cardiovascular Disease

## 2018-03-31 NOTE — Telephone Encounter (Signed)
Spoke with patient who called to ask if she should remain off HCTZ. She states her lower extremity swelling has improved with the use of compression stockings and leg elevation. I advised that she does need to stay off HCTZ and per Dr. Elease Hashimoto he would like to see her in a few weeks. I scheduled an appointment for her on 2/10 and advised her to call back sooner with questions or concerns. She verbalized understanding and agreement with plan of care and thanked me for the call.

## 2018-03-31 NOTE — Telephone Encounter (Signed)
New Message:   Patient calling and would like to know should she continue to take HCT 25 mg.Patient states that it is for feet and legs.

## 2018-04-06 ENCOUNTER — Telehealth: Payer: Self-pay | Admitting: Cardiovascular Disease

## 2018-04-06 MED ORDER — POTASSIUM CHLORIDE CRYS ER 10 MEQ PO TBCR: 20.0000 meq | EXTENDED_RELEASE_TABLET | Freq: Two times a day (BID) | ORAL | 11 refills | Status: DC

## 2018-04-06 MED ORDER — FUROSEMIDE 20 MG PO TABS: 20.0000 mg | ORAL_TABLET | Freq: Every day | ORAL | 11 refills | Status: DC

## 2018-04-06 NOTE — Telephone Encounter (Signed)
Called patient back with Dr. Harvie Bridge recommendations. Patient will try lasix 20 mg daily and increase her Kdur to 20 meq BID. Patient has appt on 04/12/18. Patient would like to get lab work at the appointment, so she will not be making any extra trips. Informed patient that should be fine. Will send message to Dr. Harvie Bridge nurse so she is aware.

## 2018-04-06 NOTE — Telephone Encounter (Signed)
Called patient back about her message. Patient complaining of gaining 5 lbs in one day and swelling in BLE. Patient stated she is following Dr. Harvie Bridge recommendations, elevating legs, wearing compression stockings, and keeping a low salt diet.  Patient's BP good at 136/87 and 117/69, HR 80's. Will send to Dr. Elease Hashimoto to advise.

## 2018-04-06 NOTE — Telephone Encounter (Signed)
Will try low dose Lasix 20 mg a day  Please have her increase her Kdur to 20 meq BI Check BMP in 1-2 weeks

## 2018-04-06 NOTE — Telephone Encounter (Signed)
Pt c/o swelling: STAT is pt has developed SOB within 24 hours  1) How much weight have you gained and in what time span? 5 in one day  2) If swelling, where is the swelling located? Legs feet and knees   3) Are you currently taking a fluid pill? Taking one pill  4) Are you currently SOB? No just little when walking  5) Do you have a log of your daily weights (if so, list)? Last three days. 153.9, 152.9, today 157  6) Have you gained 3 pounds in a day or 5 pounds in a week? 5 in one day  7) Have you traveled recently? No

## 2018-04-09 ENCOUNTER — Telehealth (HOSPITAL_COMMUNITY): Payer: Self-pay

## 2018-04-09 ENCOUNTER — Encounter (HOSPITAL_COMMUNITY): Payer: Self-pay

## 2018-04-09 NOTE — Telephone Encounter (Signed)
Pt returned CR phone call, stated she will think about participate and will give [place] a call back.  If pt does not follow up, will follow up with her in a week.

## 2018-04-09 NOTE — Telephone Encounter (Signed)
Attempted to call pt a 2nd time- LM ON VM ° °Mailed letter out °

## 2018-04-12 ENCOUNTER — Ambulatory Visit (INDEPENDENT_AMBULATORY_CARE_PROVIDER_SITE_OTHER): Payer: Medicare Other | Admitting: Cardiovascular Disease

## 2018-04-12 ENCOUNTER — Encounter: Payer: Self-pay | Admitting: Cardiovascular Disease

## 2018-04-12 VITALS — BP 126/98 | HR 97 | Ht 62.0 in | Wt 154.9 lb

## 2018-04-12 DIAGNOSIS — I4819 Other persistent atrial fibrillation: Secondary | ICD-10-CM

## 2018-04-12 DIAGNOSIS — I2510 Atherosclerotic heart disease of native coronary artery without angina pectoris: Secondary | ICD-10-CM

## 2018-04-12 MED ORDER — FUROSEMIDE 20 MG PO TABS: 40.0000 mg | ORAL_TABLET | Freq: Every day | ORAL | 3 refills | Status: DC

## 2018-04-12 NOTE — Progress Notes (Signed)
Cardiology Office Note:    Date:  04/12/2018   ID:  DORAN DEC, DOB September 30, 1932, MRN [id number]  PCP:  Martha Clan, MD  Cardiologist:  Kristeen Miss, MD  Electrophysiologist:  None   Referring MD: Martha Clan, MD   Chief Complaint  Patient presents with  . Coronary Artery Disease     Jan. 10, 2020   Charlene Dickerson is a 85 y.o. female with a recent admission to the hospital with an acute inferior wall myocardial infarction.  Was complicated by right ventricular infarction.  Was seen with [name], ( daughter)   She initially had lots of problems with hypotension and required Levophed for several days while in hospital.  But has made a great recovery.   Also had paroxysmal atrial fibrillation as a complication of her heart attack.  She converted back to sinus rhythm on December 24    She is now feeling quite a bit better. No syncope or presyncope.  She is not had any episodes of angina.  April 12, 2018: See seen back today for follow-up visit.  She has a history of an inferior wall myocardial infarction complicated by right ventricular myocardial infarction.  She is developed atrial fibrillation. Gaining weight and her blood pressures been elevated.  We started her on furosemide 20 mg a day . Marland Kitchen  Swelling has improved significantly after starting Lasix but she still very uncomfortable. In discussing with her daughter, [name] , it is clear that Charlene Dickerson is not putting out much urine even on the Lasix.   Past Medical History:  Diagnosis Date  . Diverticulosis of colon   . Essential tremor   . GERD (gastroesophageal reflux disease)    occasional (no meds)  . History of basal cell carcinoma excision 2012  . Hypertension   . Osteoporosis   . PONV (postoperative nausea and vomiting)   . Rectocele    symptomatic    Past Surgical History:  Procedure Laterality Date  . BREAST EXCISIONAL BIOPSY Left 1995  . BREAST EXCISIONAL BIOPSY Right 1988  . CATARACT EXTRACTION W/  INTRAOCULAR LENS  IMPLANT, BILATERAL  right 09/ 2013;  left 10/ 2013  . COLONOSCOPY  last one 04/10/ 2012  . CORONARY STENT INTERVENTION N/A 02/18/2018   Procedure: CORONARY STENT INTERVENTION;  Surgeon: Runell Gess, MD;  Location: [facility]IVE [facility];  Service: Cardiovascular;  Laterality: N/A;  . D & C HYSTEROSCOPY W/ RESECTION ENDOMETRIAL POLYP AND LESION  07-27-2007   dr Ambrose Mantle @[facility]   . LEFT HEART CATH AND CORONARY ANGIOGRAPHY N/A 02/18/2018   Procedure: LEFT HEART CATH AND CORONARY ANGIOGRAPHY;  Surgeon: Runell Gess, MD;  Location: [facility]AS[facility] CV [facility];  Service: Cardiovascular;  Laterality: N/A;  . RECTOCELE REPAIR N/A 11/26/2017   Procedure: POSTERIOR REPAIR (RECTOCELE);  Surgeon: Tracey Harries, MD;  Location: [facility];  Service: Gynecology;  Laterality: N/A;  OUTPT IN BED  . TEMPORARY PACEMAKER N/A 02/18/2018   Procedure: TEMPORARY PACEMAKER;  Surgeon: Runell Gess, MD;  Location: [facility] INVASIVE CV LAB;  Service: Cardiovascular;  Laterality: N/A;  . TUBAL LIGATION Bilateral yrs ago    Current Medications: Current Meds  Medication Sig  . acetaminophen (TYLENOL) 500 MG tablet Take 1,000 mg by mouth every 6 (six) hours as needed (pain.).   Marland Kitchen ALPRAZolam (XANAX) 0.5 MG tablet Take 0.25-0.5 tablets by mouth 2 (two) times daily as needed for anxiety.   Marland Kitchen apixaban (ELIQUIS) 5 MG TABS tablet Take 1 tablet (5 mg total)  by mouth 2 (two) times daily.  Marland Kitchen aspirin 81 MG chewable tablet Chew 1 tablet (81 mg total) by mouth daily.  Marland Kitchen atorvastatin (LIPITOR) 40 MG tablet Take 1 tablet (40 mg total) by mouth daily at 6 PM.  . calcium citrate (CALCITRATE - DOSED IN MG ELEMENTAL CALCIUM) 950 MG tablet Take 200 mg of elemental calcium by mouth daily.  . clopidogrel (PLAVIX) 75 MG tablet Take 1 tablet (75 mg total) by mouth daily.  . famotidine (PEPCID AC) 10 MG tablet Take 10 mg by mouth as needed for heartburn or indigestion.  . fluticasone (FLONASE) 50 MCG/ACT nasal spray  Place 1 spray into both nostrils 2 (two) times daily as needed for allergies or rhinitis.   Marland Kitchen latanoprost (XALATAN) 0.005 % ophthalmic solution Place 1 drop into both eyes at bedtime.   Marland Kitchen loratadine (CLARITIN) 10 MG tablet Take 10 mg by mouth daily.  . Multiple Vitamin (MULTIVITAMIN WITH MINERALS) TABS tablet Take 1 tablet by mouth daily.  . nitroGLYCERIN (NITROSTAT) 0.4 MG SL tablet Place 1 tablet (0.4 mg total) under the tongue every 5 (five) minutes as needed for chest pain.  . pantoprazole (PROTONIX) 40 MG tablet Take 1 tablet (40 mg total) by mouth at bedtime.  . potassium chloride (K-DUR,KLOR-CON) 10 MEQ tablet Take 2 tablets (20 mEq total) by mouth 2 (two) times daily.  . propranolol (INDERAL) 10 MG tablet Take 10 mg by mouth 2 (two) times daily. (2) 1 times daily   . simethicone (MYLICON) 80 MG chewable tablet Chew 160 mg by mouth every 6 (six) hours as needed for flatulence.  . Vitamin D, Ergocalciferol, (DRISDOL) 1.25 MG (50000 UT) CAPS capsule Take 50,000 Units by mouth every Wednesday.   . [DISCONTINUED] furosemide (LASIX) 20 MG tablet Take 1 tablet (20 mg total) by mouth daily.     Allergies:   Clindamycin/lincomycin   Social History   Socioeconomic History  . Marital status: Widowed    Spouse name: Not on file  . Number of children: Not on file  . Years of education: Not on file  . Highest education level: Not on file  Occupational History  . Not on file  Social Needs  . Financial resource strain: Not on file  . Food insecurity:    Worry: Not on file    Inability: Not on file  . Transportation needs:    Medical: Not on file    Non-medical: Not on file  Tobacco Use  . Smoking status: Former Smoker    Last attempt to quit: 05/08/1961    Years since quitting: 56.9  . Smokeless tobacco: Never Used  Substance and Sexual Activity  . Alcohol use: No  . Drug use: Not on file  . Sexual activity: Not on file  Lifestyle  . Physical activity:    Days per week: Not on file      Minutes per session: Not on file  . Stress: Not on file  Relationships  . Social connections:    Talks on phone: Not on file    Gets together: Not on file    Attends religious service: Not on file    Active member of club or organization: Not on file    Attends meetings of clubs or organizations: Not on file    Relationship status: Not on file  Other Topics Concern  . Not on file  Social History Narrative  . Not on file     Family History: The patient's family history includes Breast  cancer in her daughter, maternal aunt, maternal grandmother, and sister; Cancer in her daughter, maternal aunt, maternal grandmother, and sister; Heart disease in her father and sister.  ROS:   Please see the history of present illness.     All other systems reviewed and are negative.  EKGs/Labs/Other Studies Reviewed:    The following studies were reviewed today:   EKG:    Jan. 10, 2020 Atrial fib with HR of 91.    Inf. MI   Recent Labs: 02/23/2018: ALT 580; BUN 18; Creatinine, Ser 0.82; Potassium 3.8; Sodium 135 02/24/2018: Hemoglobin 9.7; Magnesium 1.9; Platelets 147  Recent Lipid Panel    Component Value Date/Time   CHOL 168 02/18/2018 1405   TRIG 70 02/18/2018 1405   HDL 62 02/18/2018 1405   CHOLHDL 2.7 02/18/2018 1405   VLDL 14 02/18/2018 1405   LDLCALC 92 02/18/2018 1405    Physical Exam: Blood pressure (!) 126/98, pulse 97, height 5\' 2"  (1.575 m), weight 154 lb 14.1 oz (70.3 kg), SpO2 98 %.  GEN:   Elderly female  HEENT: Normal NECK: No JVD; No carotid bruits LYMPHATICS: No lymphadenopathy CARDIAC: RRR , no murmurs, rubs, gallops RESPIRATORY:  Clear to auscultation without rales, wheezing or rhonchi  ABDOMEN: Soft, non-tender, non-distended MUSCULOSKELETAL:   2 + bilateral edema  SKIN: Warm and dry NEUROLOGIC:  Alert and oriented x 3   ASSESSMENT:    1. Coronary artery disease involving native coronary artery of native heart without angina pectoris   2. Persistent  atrial fibrillation    PLAN:    In order of problems listed above:  Inferior wall myocardial infarction-  Not had any further episodes of angina.  2.  Acute congestive heart failure: Having lots of fluid  retention and also shortness of breath with exertion.  We started her on Lasix 20 mg a day but she has not had any significant diuresis.  Her legs still have 2+ edema. I also suspect that the atrial fibrillation is contributing to her heart failure. We will schedule her for a cardioversion.  We will increase the Lasix to 40 mg a day.  I have given her instructions on how to get a lounge doctor leg rest.  We will have her return in 2 to 3 weeks for an office visit with the physician's assistant/APP.  We will get a basic metabolic profile and EKG at that time.  2.  Paroxysmal atrial fibrillation:   Had persistent atrial fibrillation.  We will schedule her for cardioversion.  I have discussed the importance of continuation of the Eliquis. Medication Adjustments/Labs and Tests Ordered: Current medicines are reviewed at length with the patient today.  Concerns regarding medicines are outlined above.  Orders Placed This Encounter  Procedures  . Basic Metabolic Panel (BMET)  . CBC   Meds ordered this encounter  Medications  . furosemide (LASIX) 20 MG tablet    Sig: Take 2 tablets (40 mg total) by mouth daily.    Dispense:  180 tablet    Refill:  3    Patient Instructions  Medication Instructions:  Your physician has recommended you make the following change in your medication:  INCREASE Furosemide (lasix) to 40 mg daily (take 2 of your 20 mg tablets every morning)  If you need a refill on your cardiac medications before your next appointment, please call your pharmacy.   Lab work: TODAY - CBC, BMET  If you have labs (blood work) drawn today and your tests are completely normal,  you will receive your results only by: Marland Kitchen MyChart Message (if you have MyChart) OR . A paper copy in  the mail If you have any lab test that is abnormal or we need to change your treatment, we will call you to review the results.   Testing/Procedures: Your physician has recommended that you have a Cardioversion (DCCV). Electrical Cardioversion uses a jolt of electricity to your heart either through paddles or wired patches attached to your chest. This is a controlled, usually prescheduled, procedure. Defibrillation is done under light anesthesia in the hospital, and you usually go home the day of the procedure. This is done to get your heart back into a normal rhythm. You are not awake for the procedure. Please see the instruction sheet given to you today.     Follow-Up: Your physician recommends that you return for a follow-up appointment on Monday March 2 at 9:45 am with Tereso Newcomer, PA     For your  leg edema you  should do  the following 1. Leg elevation - I recommend the Lounge Dr. Leg rest.  See below for details  2. Salt restriction  -  Use potassium chloride instead of regular salt as a salt substitute. 3. Walk regularly 4. Compression hose - [facility] [employer] 5. Weight loss    Available on [email or web address] Or  Go to [email or web address]     You are scheduled for a Cardioversion on  with Dr.  ________.  Please arrive at the [facility] (Main Entrance A) at [facility]: [address] [place], [place] [address] at TBD. (1 hour prior to procedure unless lab work is needed; if lab work is needed arrive 1.5 hours ahead)  DIET: Nothing to eat or drink after midnight except a sip of water with medications (see medication instructions below)  Medication Instructions: Hold Lasix (furosemide) on the morning of the procedure  Continue your anticoagulant: Eliquis You will need to continue your anticoagulant after your  procedure until you  are told by your Provider that it is safe to  stop    You must have a responsible person to drive you home and stay in the waiting area  during your procedure. Failure to do so could result in cancellation.  Bring your insurance cards.  *Special Note: Every effort is made to have your procedure done on time. Occasionally there are emergencies that occur at the hospital that may cause delays. Please be patient if a delay does occur.      Signed, Kristeen Miss, MD  04/12/2018 5:45 PM    [facility]

## 2018-04-12 NOTE — Patient Instructions (Addendum)
Medication Instructions:  Your physician has recommended you make the following change in your medication:  INCREASE Furosemide (lasix) to 40 mg daily (take 2 of your 20 mg tablets every morning)  If you need a refill on your cardiac medications before your next appointment, please call your pharmacy.   Lab work: TODAY - CBC, BMET  If you have labs (blood work) drawn today and your tests are completely normal, you will receive your results only by: Marland Kitchen MyChart Message (if you have MyChart) OR . A paper copy in the mail If you have any lab test that is abnormal or we need to change your treatment, we will call you to review the results.   Testing/Procedures: Your physician has recommended that you have a Cardioversion (DCCV). Electrical Cardioversion uses a jolt of electricity to your heart either through paddles or wired patches attached to your chest. This is a controlled, usually prescheduled, procedure. Defibrillation is done under light anesthesia in the hospital, and you usually go home the day of the procedure. This is done to get your heart back into a normal rhythm. You are not awake for the procedure. Please see the instruction sheet given to you today.     Follow-Up: Your physician recommends that you return for a follow-up appointment on Monday March 2 at 9:45 am with Tereso Newcomer, PA     For your  leg edema you  should do  the following 1. Leg elevation - I recommend the Lounge Dr. Leg rest.  See below for details  2. Salt restriction  -  Use potassium chloride instead of regular salt as a salt substitute. 3. Walk regularly 4. Compression hose - [facility] [employer] 5. Weight loss    Available on [email or web address] Or  Go to [email or web address]     You are scheduled for a Cardioversion on  with Dr.  ________.  Please arrive at the [facility] (Main Entrance A) at [facility]: [address] [place], [place] [address] at TBD. (1 hour prior to procedure unless lab  work is needed; if lab work is needed arrive 1.5 hours ahead)  DIET: Nothing to eat or drink after midnight except a sip of water with medications (see medication instructions below)  Medication Instructions: Hold Lasix (furosemide) on the morning of the procedure  Continue your anticoagulant: Eliquis You will need to continue your anticoagulant after your  procedure until you  are told by your Provider that it is safe to  stop    You must have a responsible person to drive you home and stay in the waiting area during your procedure. Failure to do so could result in cancellation.  Bring your insurance cards.  *Special Note: Every effort is made to have your procedure done on time. Occasionally there are emergencies that occur at the hospital that may cause delays. Please be patient if a delay does occur.

## 2018-04-12 NOTE — H&P (View-Only) (Signed)
Cardiology Office Note:    Date:  04/12/2018   ID:  Charlene Dickerson, DOB 11/29/1932, MRN [id number]  PCP:  Shaw, William, MD  Cardiologist:  Waldon Sheerin, MD  Electrophysiologist:  None   Referring MD: Shaw, William, MD   Chief Complaint  Patient presents with  . Coronary Artery Disease     Jan. 10, 2020   Charlene Dickerson is a 85 y.o. female with a recent admission to the hospital with an acute inferior wall myocardial infarction.  Was complicated by right ventricular infarction.  Was seen with Charlene Dickerson, ( daughter)   She initially had lots of problems with hypotension and required Levophed for several days while in hospital.  But has made a great recovery.   Also had paroxysmal atrial fibrillation as a complication of her heart attack.  She converted back to sinus rhythm on December 24    She is now feeling quite a bit better. No syncope or presyncope.  She is not had any episodes of angina.  April 12, 2018: See seen back today for follow-up visit.  She has a history of an inferior wall myocardial infarction complicated by right ventricular myocardial infarction.  She is developed atrial fibrillation. Gaining weight and her blood pressures been elevated.  We started her on furosemide 20 mg a day . .  Swelling has improved significantly after starting Lasix but she still very uncomfortable. In discussing with her daughter, Charlene Dickerson , it is clear that Charlene Dickerson is not putting out much urine even on the Lasix.   Past Medical History:  Diagnosis Date  . Diverticulosis of colon   . Essential tremor   . GERD (gastroesophageal reflux disease)    occasional (no meds)  . History of basal cell carcinoma excision 2012  . Hypertension   . Osteoporosis   . PONV (postoperative nausea and vomiting)   . Rectocele    symptomatic    Past Surgical History:  Procedure Laterality Date  . BREAST EXCISIONAL BIOPSY Left 1995  . BREAST EXCISIONAL BIOPSY Right 1988  . CATARACT EXTRACTION W/  INTRAOCULAR LENS  IMPLANT, BILATERAL  right 09/ 2013;  left 10/ 2013  . COLONOSCOPY  last one 04/10/ 2012  . CORONARY STENT INTERVENTION N/A 02/18/2018   Procedure: CORONARY STENT INTERVENTION;  Surgeon: Berry, Jonathan J, MD;  Location: [facility];  Service: Cardiovascular;  Laterality: N/A;  . D & C HYSTEROSCOPY W/ RESECTION ENDOMETRIAL POLYP AND LESION  07-27-2007   dr henley @[facility]  . LEFT HEART CATH AND CORONARY ANGIOGRAPHY N/A 02/18/2018   Procedure: LEFT HEART CATH AND CORONARY ANGIOGRAPHY;  Surgeon: Berry, Jonathan J, MD;  Location: [facility];  Service: Cardiovascular;  Laterality: N/A;  . RECTOCELE REPAIR N/A 11/26/2017   Procedure: POSTERIOR REPAIR (RECTOCELE);  Surgeon: Henley, Thomas, MD;  Location: [facility];  Service: Gynecology;  Laterality: N/A;  OUTPT IN BED  . TEMPORARY PACEMAKER N/A 02/18/2018   Procedure: TEMPORARY PACEMAKER;  Surgeon: Berry, Jonathan J, MD;  Location: [facility];  Service: Cardiovascular;  Laterality: N/A;  . TUBAL LIGATION Bilateral yrs ago    Current Medications: Current Meds  Medication Sig  . acetaminophen (TYLENOL) 500 MG tablet Take 1,000 mg by mouth every 6 (six) hours as needed (pain.).   . ALPRAZolam (XANAX) 0.5 MG tablet Take 0.25-0.5 tablets by mouth 2 (two) times daily as needed for anxiety.   . apixaban (ELIQUIS) 5 MG TABS tablet Take 1 tablet (5 mg total)   by mouth 2 (two) times daily.  Marland Kitchen aspirin 81 MG chewable tablet Chew 1 tablet (81 mg total) by mouth daily.  Marland Kitchen atorvastatin (LIPITOR) 40 MG tablet Take 1 tablet (40 mg total) by mouth daily at 6 PM.  . calcium citrate (CALCITRATE - DOSED IN MG ELEMENTAL CALCIUM) 950 MG tablet Take 200 mg of elemental calcium by mouth daily.  . clopidogrel (PLAVIX) 75 MG tablet Take 1 tablet (75 mg total) by mouth daily.  . famotidine (PEPCID AC) 10 MG tablet Take 10 mg by mouth as needed for heartburn or indigestion.  . fluticasone (FLONASE) 50 MCG/ACT nasal spray  Place 1 spray into both nostrils 2 (two) times daily as needed for allergies or rhinitis.   Marland Kitchen latanoprost (XALATAN) 0.005 % ophthalmic solution Place 1 drop into both eyes at bedtime.   Marland Kitchen loratadine (CLARITIN) 10 MG tablet Take 10 mg by mouth daily.  . Multiple Vitamin (MULTIVITAMIN WITH MINERALS) TABS tablet Take 1 tablet by mouth daily.  . nitroGLYCERIN (NITROSTAT) 0.4 MG SL tablet Place 1 tablet (0.4 mg total) under the tongue every 5 (five) minutes as needed for chest pain.  . pantoprazole (PROTONIX) 40 MG tablet Take 1 tablet (40 mg total) by mouth at bedtime.  . potassium chloride (K-DUR,KLOR-CON) 10 MEQ tablet Take 2 tablets (20 mEq total) by mouth 2 (two) times daily.  . propranolol (INDERAL) 10 MG tablet Take 10 mg by mouth 2 (two) times daily. (2) 1 times daily   . simethicone (MYLICON) 80 MG chewable tablet Chew 160 mg by mouth every 6 (six) hours as needed for flatulence.  . Vitamin D, Ergocalciferol, (DRISDOL) 1.25 MG (50000 UT) CAPS capsule Take 50,000 Units by mouth every Wednesday.   . [DISCONTINUED] furosemide (LASIX) 20 MG tablet Take 1 tablet (20 mg total) by mouth daily.     Allergies:   Clindamycin/lincomycin   Social History   Socioeconomic History  . Marital status: Widowed    Spouse name: Not on file  . Number of children: Not on file  . Years of education: Not on file  . Highest education level: Not on file  Occupational History  . Not on file  Social Needs  . Financial resource strain: Not on file  . Food insecurity:    Worry: Not on file    Inability: Not on file  . Transportation needs:    Medical: Not on file    Non-medical: Not on file  Tobacco Use  . Smoking status: Former Smoker    Last attempt to quit: 05/08/1961    Years since quitting: 56.9  . Smokeless tobacco: Never Used  Substance and Sexual Activity  . Alcohol use: No  . Drug use: Not on file  . Sexual activity: Not on file  Lifestyle  . Physical activity:    Days per week: Not on file      Minutes per session: Not on file  . Stress: Not on file  Relationships  . Social connections:    Talks on phone: Not on file    Gets together: Not on file    Attends religious service: Not on file    Active member of club or organization: Not on file    Attends meetings of clubs or organizations: Not on file    Relationship status: Not on file  Other Topics Concern  . Not on file  Social History Narrative  . Not on file     Family History: The patient's family history includes Breast  cancer in her daughter, maternal aunt, maternal grandmother, and sister; Cancer in her daughter, maternal aunt, maternal grandmother, and sister; Heart disease in her father and sister.  ROS:   Please see the history of present illness.     All other systems reviewed and are negative.  EKGs/Labs/Other Studies Reviewed:    The following studies were reviewed today:   EKG:    Jan. 10, 2020 Atrial fib with HR of 91.    Inf. MI   Recent Labs: 02/23/2018: ALT 580; BUN 18; Creatinine, Ser 0.82; Potassium 3.8; Sodium 135 02/24/2018: Hemoglobin 9.7; Magnesium 1.9; Platelets 147  Recent Lipid Panel    Component Value Date/Time   CHOL 168 02/18/2018 1405   TRIG 70 02/18/2018 1405   HDL 62 02/18/2018 1405   CHOLHDL 2.7 02/18/2018 1405   VLDL 14 02/18/2018 1405   LDLCALC 92 02/18/2018 1405    Physical Exam: Blood pressure (!) 126/98, pulse 97, height 5' 2" (1.575 m), weight 154 lb 14.1 oz (70.3 kg), SpO2 98 %.  GEN:   Elderly female  HEENT: Normal NECK: No JVD; No carotid bruits LYMPHATICS: No lymphadenopathy CARDIAC: RRR , no murmurs, rubs, gallops RESPIRATORY:  Clear to auscultation without rales, wheezing or rhonchi  ABDOMEN: Soft, non-tender, non-distended MUSCULOSKELETAL:   2 + bilateral edema  SKIN: Warm and dry NEUROLOGIC:  Alert and oriented x 3   ASSESSMENT:    1. Coronary artery disease involving native coronary artery of native heart without angina pectoris   2. Persistent  atrial fibrillation    PLAN:    In order of problems listed above:  Inferior wall myocardial infarction-  Not had any further episodes of angina.  2.  Acute congestive heart failure: Having lots of fluid  retention and also shortness of breath with exertion.  We started her on Lasix 20 mg a day but she has not had any significant diuresis.  Her legs still have 2+ edema. I also suspect that the atrial fibrillation is contributing to her heart failure. We will schedule her for a cardioversion.  We will increase the Lasix to 40 mg a day.  I have given her instructions on how to get a lounge doctor leg rest.  We will have her return in 2 to 3 weeks for an office visit with the physician's assistant/APP.  We will get a basic metabolic profile and EKG at that time.  2.  Paroxysmal atrial fibrillation:   Had persistent atrial fibrillation.  We will schedule her for cardioversion.  I have discussed the importance of continuation of the Eliquis. Medication Adjustments/Labs and Tests Ordered: Current medicines are reviewed at length with the patient today.  Concerns regarding medicines are outlined above.  Orders Placed This Encounter  Procedures  . Basic Metabolic Panel (BMET)  . CBC   Meds ordered this encounter  Medications  . furosemide (LASIX) 20 MG tablet    Sig: Take 2 tablets (40 mg total) by mouth daily.    Dispense:  180 tablet    Refill:  3    Patient Instructions  Medication Instructions:  Your physician has recommended you make the following change in your medication:  INCREASE Furosemide (lasix) to 40 mg daily (take 2 of your 20 mg tablets every morning)  If you need a refill on your cardiac medications before your next appointment, please call your pharmacy.   Lab work: TODAY - CBC, BMET  If you have labs (blood work) drawn today and your tests are completely normal,   you will receive your results only by: Marland Kitchen MyChart Message (if you have MyChart) OR . A paper copy in  the mail If you have any lab test that is abnormal or we need to change your treatment, we will call you to review the results.   Testing/Procedures: Your physician has recommended that you have a Cardioversion (DCCV). Electrical Cardioversion uses a jolt of electricity to your heart either through paddles or wired patches attached to your chest. This is a controlled, usually prescheduled, procedure. Defibrillation is done under light anesthesia in the hospital, and you usually go home the day of the procedure. This is done to get your heart back into a normal rhythm. You are not awake for the procedure. Please see the instruction sheet given to you today.     Follow-Up: Your physician recommends that you return for a follow-up appointment on Monday March 2 at 9:45 am with Tereso Newcomer, PA     For your  leg edema you  should do  the following 1. Leg elevation - I recommend the Lounge Dr. Leg rest.  See below for details  2. Salt restriction  -  Use potassium chloride instead of regular salt as a salt substitute. 3. Walk regularly 4. Compression hose - [facility] [employer] 5. Weight loss    Available on [email or web address] Or  Go to [email or web address]     You are scheduled for a Cardioversion on  with Dr.  ________.  Please arrive at the [facility] (Main Entrance A) at [facility]: [address] [place], [place] [address] at TBD. (1 hour prior to procedure unless lab work is needed; if lab work is needed arrive 1.5 hours ahead)  DIET: Nothing to eat or drink after midnight except a sip of water with medications (see medication instructions below)  Medication Instructions: Hold Lasix (furosemide) on the morning of the procedure  Continue your anticoagulant: Eliquis You will need to continue your anticoagulant after your  procedure until you  are told by your Provider that it is safe to  stop    You must have a responsible person to drive you home and stay in the waiting area  during your procedure. Failure to do so could result in cancellation.  Bring your insurance cards.  *Special Note: Every effort is made to have your procedure done on time. Occasionally there are emergencies that occur at the hospital that may cause delays. Please be patient if a delay does occur.      Signed, Kristeen Miss, MD  04/12/2018 5:45 PM    [facility]

## 2018-04-13 LAB — BASIC METABOLIC PANEL
BUN/Creatinine Ratio: 17 (ref 12–28)
BUN: 17 mg/dL (ref 8–27)
CO2: 23 mmol/L (ref 20–29)
Calcium: 9.2 mg/dL (ref 8.7–10.3)
Chloride: 102 mmol/L (ref 96–106)
Creatinine, Ser: 1.01 mg/dL — ABNORMAL HIGH (ref 0.57–1.00)
GFR calc Af Amer: 59 mL/min/{1.73_m2} — ABNORMAL LOW (ref >59)
GFR calc non Af Amer: 52 mL/min/{1.73_m2} — ABNORMAL LOW (ref >59)
Glucose: 81 mg/dL (ref 65–99)
Potassium: 4.2 mmol/L (ref 3.5–5.2)
Sodium: 141 mmol/L (ref 134–144)

## 2018-04-13 LAB — CBC
HEMOGLOBIN: 12.3 g/dL (ref 11.1–15.9)
Hematocrit: 37.7 % (ref 34.0–46.6)
MCH: 32.1 pg (ref 26.6–33.0)
MCHC: 32.6 g/dL (ref 31.5–35.7)
MCV: 98 fL — ABNORMAL HIGH (ref 79–97)
Platelets: 204 10*3/uL (ref 150–450)
RBC: 3.83 x10E6/uL (ref 3.77–5.28)
RDW: 12.7 % (ref 11.7–15.4)
WBC: 4.4 10*3/uL (ref 3.4–10.8)

## 2018-04-13 NOTE — Telephone Encounter (Signed)
Pt called and left a vm stating that she seen Dr. Elease Hashimoto and he suggested that she waits 2 more weeks before starting the CR program pt stated she will call back around that time or that we could give her a call back.

## 2018-04-14 ENCOUNTER — Encounter: Payer: Self-pay | Admitting: Physician Assistant

## 2018-04-16 ENCOUNTER — Encounter (HOSPITAL_COMMUNITY): Payer: Self-pay

## 2018-04-16 ENCOUNTER — Encounter (HOSPITAL_COMMUNITY)
Admission: RE | Disposition: A | Discharge status: Home / Self Care | Payer: Self-pay | Source: Home / Self Care | Attending: Internal Medicine

## 2018-04-16 ENCOUNTER — Ambulatory Visit (HOSPITAL_COMMUNITY): Payer: Medicare Other | Admitting: Certified Registered"

## 2018-04-16 ENCOUNTER — Ambulatory Visit (HOSPITAL_COMMUNITY)
Admission: RE | Admit: 2018-04-16 | Discharge: 2018-04-16 | Disposition: A | Discharge status: Home / Self Care | Payer: Medicare Other | Attending: Internal Medicine | Admitting: Internal Medicine

## 2018-04-16 ENCOUNTER — Other Ambulatory Visit: Payer: Self-pay

## 2018-04-16 ENCOUNTER — Other Ambulatory Visit: Payer: Self-pay | Admitting: Cardiovascular Disease

## 2018-04-16 DIAGNOSIS — Z79899 Other long term (current) drug therapy: Secondary | ICD-10-CM | POA: Diagnosis not present

## 2018-04-16 DIAGNOSIS — I509 Heart failure, unspecified: Secondary | ICD-10-CM | POA: Diagnosis not present

## 2018-04-16 DIAGNOSIS — I252 Old myocardial infarction: Secondary | ICD-10-CM | POA: Insufficient documentation

## 2018-04-16 DIAGNOSIS — Z85828 Personal history of other malignant neoplasm of skin: Secondary | ICD-10-CM | POA: Diagnosis not present

## 2018-04-16 DIAGNOSIS — I2510 Atherosclerotic heart disease of native coronary artery without angina pectoris: Secondary | ICD-10-CM | POA: Diagnosis not present

## 2018-04-16 DIAGNOSIS — Z87891 Personal history of nicotine dependence: Secondary | ICD-10-CM | POA: Insufficient documentation

## 2018-04-16 DIAGNOSIS — Z7982 Long term (current) use of aspirin: Secondary | ICD-10-CM | POA: Insufficient documentation

## 2018-04-16 DIAGNOSIS — I110 Hypertensive heart disease with heart failure: Secondary | ICD-10-CM | POA: Diagnosis not present

## 2018-04-16 DIAGNOSIS — Z7901 Long term (current) use of anticoagulants: Secondary | ICD-10-CM | POA: Insufficient documentation

## 2018-04-16 DIAGNOSIS — Z955 Presence of coronary angioplasty implant and graft: Secondary | ICD-10-CM | POA: Insufficient documentation

## 2018-04-16 DIAGNOSIS — I4819 Other persistent atrial fibrillation: Secondary | ICD-10-CM | POA: Insufficient documentation

## 2018-04-16 DIAGNOSIS — G250 Essential tremor: Secondary | ICD-10-CM | POA: Diagnosis not present

## 2018-04-16 DIAGNOSIS — Z8249 Family history of ischemic heart disease and other diseases of the circulatory system: Secondary | ICD-10-CM | POA: Diagnosis not present

## 2018-04-16 DIAGNOSIS — I480 Paroxysmal atrial fibrillation: Secondary | ICD-10-CM | POA: Diagnosis not present

## 2018-04-16 DIAGNOSIS — K219 Gastro-esophageal reflux disease without esophagitis: Secondary | ICD-10-CM | POA: Insufficient documentation

## 2018-04-16 DIAGNOSIS — Z881 Allergy status to other antibiotic agents status: Secondary | ICD-10-CM | POA: Insufficient documentation

## 2018-04-16 HISTORY — PX: CARDIOVERSION: SHX1299

## 2018-04-16 SURGERY — CARDIOVERSION
Anesthesia: General

## 2018-04-16 MED ORDER — PROPOFOL 10 MG/ML IV BOLUS
INTRAVENOUS | Status: DC | PRN
  Administered 2018-04-16: 50 mg via INTRAVENOUS

## 2018-04-16 MED ORDER — SODIUM CHLORIDE 0.9 % IV SOLN
INTRAVENOUS | Status: DC | PRN
  Administered 2018-04-16: 09:00:00 via INTRAVENOUS

## 2018-04-16 MED ORDER — LIDOCAINE 2% (20 MG/ML) 5 ML SYRINGE
INTRAMUSCULAR | Status: DC | PRN
  Administered 2018-04-16: 60 mg via INTRAVENOUS

## 2018-04-16 NOTE — Discharge Instructions (Signed)
Electrical Cardioversion, Care After °This sheet gives you information about how to care for yourself after your procedure. Your health care provider may also give you more specific instructions. If you have problems or questions, contact your health care provider. °What can I expect after the procedure? °After the procedure, it is common to have: °· Some redness on the skin where the shocks were given. °Follow these instructions at home: ° °· Do not drive for 24 hours if you were given a medicine to help you relax (sedative). °· Take over-the-counter and prescription medicines only as told by your health care provider. °· Ask your health care provider how to check your pulse. Check it often. °· Rest for 48 hours after the procedure or as told by your health care provider. °· Avoid or limit your caffeine use as told by your health care provider. °Contact a health care provider if: °· You feel like your heart is beating too quickly or your pulse is not regular. °· You have a serious muscle cramp that does not go away. °Get help right away if: ° °· You have discomfort in your chest. °· You are dizzy or you feel faint. °· You have trouble breathing or you are short of breath. °· Your speech is slurred. °· You have trouble moving an arm or leg on one side of your body. °· Your fingers or toes turn cold or blue. °This information is not intended to replace advice given to you by your health care provider. Make sure you discuss any questions you have with your health care provider. °Document Released: 12/08/2012 Document Revised: 09/21/2015 Document Reviewed: 08/24/2015 °Elsevier Interactive Patient Education © 2019 [employer] ° °

## 2018-04-16 NOTE — Interval H&P Note (Signed)
History and Physical Interval Note:  04/16/2018 9:48 AM  Charlene Dickerson  has presented today for surgery, with the diagnosis of a-fib  The various methods of treatment have been discussed with the patient and family. After consideration of risks, benefits and other options for treatment, the patient has consented to  Procedure(s): CARDIOVERSION (N/A) as a surgical intervention .  The patient's history has been reviewed, patient examined, no change in status, stable for surgery.  I have reviewed the patient's chart and labs.  Questions were answered to the patient's satisfaction.     Parke Poisson

## 2018-04-16 NOTE — CV Procedure (Signed)
Procedure: Electrical Cardioversion Indications:  Atrial Fibrillation  Procedure Details:  Consent: Risks of procedure as well as the alternatives and risks of each were explained to the (patient/caregiver).  Consent for procedure obtained.  Time Out: Verified patient identification, verified procedure, site/side was marked, verified correct patient position, special equipment/implants available, medications/allergies/relevent history reviewed, required imaging and test results available. PERFORMED.  Patient placed on cardiac monitor, pulse oximetry, supplemental oxygen as necessary.  Sedation given: per anesthesia Pacer pads placed anterior and posterior chest.  Cardioverted 1 time(s).  Cardioversion with synchronized biphasic 120J shock.  Evaluation: Findings: Post procedure EKG shows: NSR Complications: None Patient did tolerate procedure well.  Time Spent Directly with the Patient:  30 minutes   Parke Poisson 04/16/2018, 9:48 AM

## 2018-04-16 NOTE — Anesthesia Preprocedure Evaluation (Addendum)
Anesthesia Evaluation  Patient identified by MRN, date of birth, ID band Patient awake    Reviewed: Allergy & Precautions, NPO status , Patient's Chart, lab work & pertinent test results  History of Anesthesia Complications (+) PONV and history of anesthetic complications  Airway Mallampati: II  TM Distance: >3 FB Neck ROM: Full    Dental no notable dental hx.    Pulmonary former smoker,    Pulmonary exam normal breath sounds clear to auscultation       Cardiovascular hypertension, + CAD, + Past MI and + Cardiac Stents  Normal cardiovascular exam+ dysrhythmias Atrial Fibrillation + pacemaker  Rhythm:Regular Rate:Normal  ECG: a-fib, rate 91   Neuro/Psych Essential tremor negative psych ROS   GI/Hepatic Neg liver ROS, GERD  Medicated and Controlled,  Endo/Other  negative endocrine ROS  Renal/GU negative Renal ROS     Musculoskeletal negative musculoskeletal ROS (+)   Abdominal   Peds  Hematology HLD   Anesthesia Other Findings a-fib  Reproductive/Obstetrics                            Anesthesia Physical Anesthesia Plan  ASA: III  Anesthesia Plan: General   Post-op Pain Management:    Induction: Intravenous  PONV Risk Score and Plan: 4 or greater and Propofol infusion and Treatment may vary due to age or medical condition  Airway Management Planned: Mask  Additional Equipment:   Intra-op Plan:   Post-operative Plan:   Informed Consent: I have reviewed the patients History and Physical, chart, labs and discussed the procedure including the risks, benefits and alternatives for the proposed anesthesia with the patient or authorized representative who has indicated his/her understanding and acceptance.     Dental advisory given  Plan Discussed with: CRNA  Anesthesia Plan Comments:         Anesthesia Quick Evaluation

## 2018-04-16 NOTE — Transfer of Care (Signed)
Immediate Anesthesia Transfer of Care Note  Patient: Charlene Dickerson  Procedure(s) Performed: CARDIOVERSION (N/A )  Patient Location: Endoscopy Unit  Anesthesia Type:General  Level of Consciousness: awake, alert , oriented and patient cooperative  Airway & Oxygen Therapy: Patient Spontanous Breathing and Patient connected to nasal cannula oxygen  Post-op Assessment: Report given to RN, Post -op Vital signs reviewed and stable and Patient moving all extremities  Post vital signs: Reviewed and stable  Last Vitals:  Vitals Value Taken Time  BP    Temp    Pulse    Resp    SpO2      Last Pain:  Vitals:   04/16/18 0839  TempSrc: Oral  PainSc: 0-No pain         Complications: No apparent anesthesia complications

## 2018-04-16 NOTE — Anesthesia Postprocedure Evaluation (Signed)
Anesthesia Post Note  Patient: Charlene Dickerson  Procedure(s) Performed: CARDIOVERSION (N/A )     Patient location during evaluation: PACU Anesthesia Type: General Level of consciousness: awake Pain management: pain level controlled Vital Signs Assessment: post-procedure vital signs reviewed and stable Respiratory status: spontaneous breathing, nonlabored ventilation, respiratory function stable and patient connected to nasal cannula oxygen Cardiovascular status: blood pressure returned to baseline and stable Postop Assessment: no apparent nausea or vomiting Anesthetic complications: no    Last Vitals:  Vitals:   04/16/18 1010 04/16/18 1020  BP: (!) 96/54 100/60  Pulse: 64 69  Resp: 19 17  Temp:    SpO2: 95% 94%    Last Pain:  Vitals:   04/16/18 1020  TempSrc:   PainSc: 0-No pain                 Shayde Gervacio P Taunia Frasco

## 2018-04-18 ENCOUNTER — Encounter (HOSPITAL_COMMUNITY): Payer: Self-pay | Admitting: Internal Medicine

## 2018-04-24 ENCOUNTER — Other Ambulatory Visit (HOSPITAL_COMMUNITY): Payer: Self-pay | Admitting: Cardiology

## 2018-05-01 NOTE — Progress Notes (Signed)
Cardiology Office Note   Date:  05/03/2018   ID:  Charlene Dickerson, DOB 1932/03/08, MRN [id number]  PCP:  Charlene Clan, MD  Cardiologist: Dr. Elease Hashimoto, MD  Chief Complaint  Patient presents with  . Follow-up  . Atrial Fibrillation     History of Present Illness: Charlene Dickerson is a 85 y.o. female who presents for post DCCV for atrial fibrillation follow up, seen for Dr. Elease Hashimoto.   [name] has a prior hx of inferior wall STEMI (01/2018) s/p PTCA/DES x 3, PAF post MI on Eliquis, GERD, HTN and osteoporosis.   She was last seen by Dr. Elease Hashimoto 04/12/2018 for post MI atrial fibrillation follow up. She was started on PO Lasix 20mg  daily without much improvement. It was suspected that her AF was contributing and therefore she was scheduled for a  DCCV. Her Lasix was also increased to 40mg  daily. She had no further angina. Plan was to have her seen back in the office 2-3 weeks after for BMET and EKG. Unfortunately she was not responsive and remained in AF.   Pt underwent DCCV on 04/16/2018 with one synchronized shock with 120J to NSR.  Today she presents and is feeling wonderful with no symptoms of fatigue, SOB or angina. EKG unfortunately shows atrial fibrillation with rate control. She reports she has exercising and weighing herself daily without s/s of symptomatic AF.   Past Medical History:  Diagnosis Date  . Diverticulosis of colon   . Essential tremor   . GERD (gastroesophageal reflux disease)    occasional (no meds)  . History of basal cell carcinoma excision 2012  . Hypertension   . Osteoporosis   . PONV (postoperative nausea and vomiting)   . Rectocele    symptomatic    Past Surgical History:  Procedure Laterality Date  . BREAST EXCISIONAL BIOPSY Left 1995  . BREAST EXCISIONAL BIOPSY Right 1988  . CARDIOVERSION N/A 04/16/2018   Procedure: CARDIOVERSION;  Surgeon: Parke Poisson, MD;  Location: [facility] ENDOSCOPY;  Service: Cardiovascular;  Laterality: N/A;  . CATARACT  EXTRACTION W/ INTRAOCULAR LENS  IMPLANT, BILATERAL  right 09/ 2013;  left 10/ 2013  . COLONOSCOPY  last one 04/10/ 2012  . CORONARY STENT INTERVENTION N/A 02/18/2018   Procedure: CORONARY STENT INTERVENTION;  Surgeon: Runell Gess, MD;  Location: [facility] INVASIVE CV LAB;  Service: Cardiovascular;  Laterality: N/A;  . D & C HYSTEROSCOPY W/ RESECTION ENDOMETRIAL POLYP AND LESION  07-27-2007   dr Ambrose Mantle @[facility]   . LEFT HEART CATH AND CORONARY ANGIOGRAPHY N/A 02/18/2018   Procedure: LEFT HEART CATH AND CORONARY ANGIOGRAPHY;  Surgeon: Runell Gess, MD;  Location: [facility] INVASIVE CV LAB;  Service: Cardiovascular;  Laterality: N/A;  . RECTOCELE REPAIR N/A 11/26/2017   Procedure: POSTERIOR REPAIR (RECTOCELE);  Surgeon: Tracey Harries, MD;  Location: [facility];  Service: Gynecology;  Laterality: N/A;  OUTPT IN BED  . TEMPORARY PACEMAKER N/A 02/18/2018   Procedure: TEMPORARY PACEMAKER;  Surgeon: Runell Gess, MD;  Location: [facility] INVASIVE CV LAB;  Service: Cardiovascular;  Laterality: N/A;  . TUBAL LIGATION Bilateral yrs ago     Current Outpatient Medications  Medication Sig Dispense Refill  . acetaminophen (TYLENOL) 500 MG tablet Take 1,000 mg by mouth every 6 (six) hours as needed (pain.).     Marland Kitchen ALPRAZolam (XANAX) 0.5 MG tablet Take 0.25-0.5 tablets by mouth 2 (two) times daily as needed for anxiety.     Marland Kitchen apixaban (ELIQUIS) 5 MG TABS tablet Take  1 tablet (5 mg total) by mouth 2 (two) times daily. 60 tablet 5  . aspirin 81 MG chewable tablet Chew 1 tablet (81 mg total) by mouth daily.    Marland Kitchen atorvastatin (LIPITOR) 40 MG tablet Take 1 tablet (40 mg total) by mouth daily at 6 PM. 90 tablet 1  . calcium citrate (CALCITRATE - DOSED IN MG ELEMENTAL CALCIUM) 950 MG tablet Take 200 mg of elemental calcium by mouth daily.    . clopidogrel (PLAVIX) 75 MG tablet Take 1 tablet (75 mg total) by mouth daily. 90 tablet 1  . famotidine (PEPCID AC) 10 MG tablet Take 10 mg by mouth as needed for  heartburn or indigestion.    . fluticasone (FLONASE) 50 MCG/ACT nasal spray Place 1 spray into both nostrils 2 (two) times daily as needed for allergies or rhinitis.     . furosemide (LASIX) 20 MG tablet Take 2 tablets (40 mg total) by mouth daily. 180 tablet 3  . latanoprost (XALATAN) 0.005 % ophthalmic solution Place 1 drop into both eyes at bedtime.     Marland Kitchen loratadine (CLARITIN) 10 MG tablet Take 10 mg by mouth daily.    . Multiple Vitamin (MULTIVITAMIN WITH MINERALS) TABS tablet Take 1 tablet by mouth daily.    . nitroGLYCERIN (NITROSTAT) 0.4 MG SL tablet Place 1 tablet (0.4 mg total) under the tongue every 5 (five) minutes as needed for chest pain. 25 tablet 3  . pantoprazole (PROTONIX) 40 MG tablet TAKE 1 TABLET BY MOUTH EVERYDAY AT BEDTIME 90 tablet 3  . potassium chloride (K-DUR,KLOR-CON) 10 MEQ tablet Take 2 tablets (20 mEq total) by mouth 2 (two) times daily. 120 tablet 11  . propranolol (INDERAL) 10 MG tablet Take 10 mg by mouth 2 (two) times daily. (2) 1 times daily     . simethicone (MYLICON) 80 MG chewable tablet Chew 160 mg by mouth every 6 (six) hours as needed for flatulence.    . Vitamin D, Ergocalciferol, (DRISDOL) 1.25 MG (50000 UT) CAPS capsule Take 50,000 Units by mouth every Wednesday.      No current facility-administered medications for this visit.     Allergies:   Clindamycin/lincomycin    Social History:  The patient  reports that she quit smoking about 57 years ago. She has never used smokeless tobacco. She reports that she does not drink alcohol or use drugs.   Family History:  The patient's family history includes Breast cancer in her daughter, maternal aunt, maternal grandmother, and sister; Cancer in her daughter, maternal aunt, maternal grandmother, and sister; Heart disease in her father and sister.    ROS:  Please see the history of present illness. Otherwise, review of systems are positive for none.   All other systems are reviewed and negative.     PHYSICAL EXAM: VS:  BP 112/60   Pulse 88   Ht 5\' 2"  (1.575 m)   Wt 150 lb (68 kg)   SpO2 95%   BMI 27.44 kg/m  , BMI Body mass index is 27.44 kg/m.    General: Well developed, well nourished, NAD Skin: Warm, dry, intact  Head: Normocephalic, atraumatic, sclera non-icteric, no xanthomas, clear, moist mucus membranes. Neck: Negative for carotid bruits. No JVD Lungs:Clear to ausculation bilaterally. No wheezes, rales, or rhonchi. Breathing is unlabored. Cardiovascular: RRR with S1 S2. No murmurs, rubs, gallops, or LV heave appreciated. Abdomen: Soft, non-tender, non-distended with normoactive bowel sounds. No hepatomegaly, No rebound/guarding. No obvious abdominal masses. MSK: Strength and tone appear normal for  age. 5/5 in all extremities Extremities: No edema. No clubbing or cyanosis. DP/PT pulses 2+ bilaterally Neuro: Alert and oriented. No focal deficits. No facial asymmetry. MAE spontaneously. Psych: Responds to questions appropriately with normal affect.     EKG:  EKG is ordered today. The ekg ordered today demonstrates atrial fibrillation with HR 88bpm   Recent Labs: 02/23/2018: ALT 580 02/24/2018: Magnesium 1.9 04/12/2018: BUN 17; Creatinine, Ser 1.01; Hemoglobin 12.3; Platelets 204; Potassium 4.2; Sodium 141    Lipid Panel    Component Value Date/Time   CHOL 168 02/18/2018 1405   TRIG 70 02/18/2018 1405   HDL 62 02/18/2018 1405   CHOLHDL 2.7 02/18/2018 1405   VLDL 14 02/18/2018 1405   LDLCALC 92 02/18/2018 1405     Wt Readings from Last 3 Encounters:  05/03/18 150 lb (68 kg)  04/12/18 154 lb 14.1 oz (70.3 kg)  03/12/18 151 lb (68.5 kg)     Other studies Reviewed: Additional studies/ records that were reviewed today include:   Cardiac catheterization 02/18/2018:  Ms. Jewart had an acute inferior STEMI complicated by complete heart block and cardiogenic shock.  Her heart rate improved with placement of a temporary transvenous pacemaker.  Her RCA  intervention was difficult because of the proximal lesion which was highly calcified and resistant to balloon dilatation as well as extensive thrombus throughout the remainder of the vessel.  Ultimately I was able to restore antegrade flow with placement of 3 stents and aggressive angioplasty.  She was placed on Aggrastat drip.  The Angiomax was discontinued at the end of the case.  A 2D echo will be obtained.   Echocardiogram 02/19/2018:  - Inferobasal hypokinesis with overall low normal LV systolic   function; mild AI; moderate RVE with severe RV dysfunction;   severe RAE; mild TR.  ASSESSMENT AND PLAN:  1. Paroxsymal atrial fibrilltion: -Now s/p DCCV performed 04/19/2018 with EKG today showing AF with rate control -Given that she is asymptomatic and continues to be in AF, would continue on current regimen without the addition of antiarrythmic at this time -Reports SOB and fatigue have not been an issue and she is feeling back to her normal self (prior to MI 01/2018) -Reports LE swelling improved with compression stockings and LE elevation  -Instructed that if symptoms recur, we can certainly try the addition if Amiodarone, however would reserve this for if symptoms re-appear -Denies s/s of acute bleeding  -Continue Eliquis, propanolol   2. Acute congestive heart failure -Has had issues  with fluid retention, started on Lasix 20mg  daily without significant diuresis>>increased to 40mg  daily on 04/12/2018 thought likely in the setting of PAF>>>plan for DCCV performed 04/19/2018 -Weight stable -Continue to monitor for SOB, fatigue>>continue daily weights, LE compression stockings  -BMET today  -Continue PO Lasix 40mg  daily  3. CAD s/p MI with PCI x3 01/2018: -Denies anginal symptoms -Continue ASA, high intensity statin, Plavix, propanolol    Current medicines are reviewed at length with the patient today.  The patient does not have concerns regarding medicines.  The following  changes have been made:  no change  Labs/ tests ordered today include: BMET  Orders Placed This Encounter  Procedures  . Basic metabolic panel  . EKG 12-Lead   Disposition:   FU with Dr. Elease Hashimoto  in 2 months  Signed, Georgie Chard, NP  05/03/2018 10:33 AM    [facility] [address] [place], [place], [place]  [address] Phone: [phone]; Fax: [phone]

## 2018-05-03 ENCOUNTER — Telehealth: Payer: Self-pay

## 2018-05-03 ENCOUNTER — Ambulatory Visit (INDEPENDENT_AMBULATORY_CARE_PROVIDER_SITE_OTHER): Payer: Medicare Other | Admitting: Cardiology

## 2018-05-03 ENCOUNTER — Encounter: Payer: Self-pay | Admitting: Physician Assistant

## 2018-05-03 VITALS — BP 112/60 | HR 88 | Ht 62.0 in | Wt 150.0 lb

## 2018-05-03 DIAGNOSIS — I2510 Atherosclerotic heart disease of native coronary artery without angina pectoris: Secondary | ICD-10-CM | POA: Diagnosis not present

## 2018-05-03 DIAGNOSIS — I4819 Other persistent atrial fibrillation: Secondary | ICD-10-CM | POA: Diagnosis not present

## 2018-05-03 DIAGNOSIS — I2111 ST elevation (STEMI) myocardial infarction involving right coronary artery: Secondary | ICD-10-CM

## 2018-05-03 LAB — BASIC METABOLIC PANEL
BUN/Creatinine Ratio: 23 (ref 12–28)
BUN: 23 mg/dL (ref 8–27)
CALCIUM: 9.5 mg/dL (ref 8.7–10.3)
CO2: 24 mmol/L (ref 20–29)
CREATININE: 1.02 mg/dL — AB (ref 0.57–1.00)
Chloride: 101 mmol/L (ref 96–106)
GFR calc Af Amer: 59 mL/min/{1.73_m2} — ABNORMAL LOW (ref >59)
GFR calc non Af Amer: 51 mL/min/{1.73_m2} — ABNORMAL LOW (ref >59)
Glucose: 77 mg/dL (ref 65–99)
Potassium: 4.5 mmol/L (ref 3.5–5.2)
Sodium: 139 mmol/L (ref 134–144)

## 2018-05-03 NOTE — Telephone Encounter (Signed)
-----   Message from Filbert Schilder, NP sent at 05/03/2018  4:37 PM EST ----- Please let the patient know that her potassium is stable and within normal limits. Her kidney function has improved since last blood draw three weeks ago. No changes needed.   Thank you  Noreene Larsson

## 2018-05-03 NOTE — Telephone Encounter (Signed)
Notes recorded by Sigurd Sos, RN on 05/03/2018 at 4:43 PM EST The patient has been notified of the result and verbalized understanding. All questions (if any) were answered. Sigurd Sos, RN 05/03/2018 4:43 PM

## 2018-05-03 NOTE — Patient Instructions (Signed)
Medication Instructions:   Your physician recommends that you continue on your current medications as directed. Please refer to the Current Medication list given to you today.    If you need a refill on your cardiac medications before your next appointment, please call your pharmacy.   Lab work: BMET TODAY   If you have labs (blood work) drawn today and your tests are completely normal, you will receive your results only by: Marland Kitchen MyChart Message (if you have MyChart) OR . A paper copy in the mail If you have any lab test that is abnormal or we need to change your treatment, we will call you to review the results.  Testing/Procedures: NONE ORDERED  TODAY    Follow-Up: At [facility], you and your health needs are our priority.  As part of our continuing mission to provide you with exceptional heart care, we have created designated Provider Care Teams.  These Care Teams include your primary Cardiologist (physician) and Advanced Practice Providers (APPs -  Physician Assistants and Nurse Practitioners) who all work together to provide you with the care you need, when you need it. You will need a follow up appointment in:  2 months.  Please call our office 2 months in advance to schedule this appointment.  You may see Kristeen Miss, MD or one of the following Advanced Practice Providers on your designated Care Team: Tereso Newcomer, PA-C [place], [place] . Berton Bon, NP  Any Other Special Instructions Will Be Listed Below (If Applicable).

## 2018-05-03 NOTE — Telephone Encounter (Signed)
No response from pt °Closed referral °Gloria W. Support Rep II °

## 2018-06-14 ENCOUNTER — Other Ambulatory Visit: Payer: Self-pay | Admitting: Cardiovascular Disease

## 2018-06-14 MED ORDER — APIXABAN 5 MG PO TABS: 5.0000 mg | ORAL_TABLET | Freq: Two times a day (BID) | ORAL | 1 refills | Status: DC

## 2018-06-14 NOTE — Telephone Encounter (Signed)
New Message     *STAT* If patient is at the pharmacy, call can be transferred to refill team.   1. Which medications need to be refilled? (please list name of each medication and dose if known) Eliquis 5mg    2. Which pharmacy/location (including street and city if local pharmacy) is medication to be sent to? [facility] on [address]   3. Do they need a 30 day or 90 day supply? 90

## 2018-06-14 NOTE — Telephone Encounter (Signed)
Age 85, weight 68kg, SCr 1.02, afib indication. Appropriate for refill.

## 2018-06-15 ENCOUNTER — Ambulatory Visit: Payer: Medicare Other | Admitting: Cardiovascular Disease

## 2018-06-25 ENCOUNTER — Telehealth: Payer: Self-pay | Admitting: Physician Assistant

## 2018-06-25 NOTE — Telephone Encounter (Signed)
Pt called to report that she has been having numbness and tingling in both of her hands on and off for a few weeks.. she denies chest pain, sob, dizziness...   She says it comes and goes like they are falling asleep... she has not had a change in her physical activity but does exercise with 3lb weights... she denies neck or back pain and her hands are not swollen or discolored..   Pt says she still has ankle edema but it is relieved when she takes her Lasix and when she elevates them and she has been watching her sodium but probably has a little more in her diet than usual..  I advised her to talk with her PMD.. Dr. Clelia Croft about possible pinched nerve.. pt also asked to let Dr. Elease Hashimoto know.. I will forward her message to him for his review.   Pt has follow up with Tereso Newcomer PA 07/06/18.

## 2018-06-25 NOTE — Telephone Encounter (Signed)
New Message    Pt is having numbness in hands, her feet ankles and legs stay swollen   She would like to talk to a nurse   Please call back

## 2018-06-25 NOTE — Telephone Encounter (Signed)
Pt verbalized understanding and will talk with Dr. Clelia Croft re: her hand numbness.

## 2018-06-25 NOTE — Telephone Encounter (Signed)
I agree that this sounds like a cervical disc issue Agree with contacting her primary MD

## 2018-06-30 ENCOUNTER — Telehealth: Payer: Self-pay | Admitting: *Deleted

## 2018-06-30 NOTE — Telephone Encounter (Signed)
SPOKE WITH PT AND PT CONSENTED  TO USING DOXIMITY or DOXY.ME - The patient will receive a link just prior to their visit by text.  Confirm consent - "In the setting of the current Covid19 crisis, you are scheduled for a (phone or video) visit with your provider on (date) at (time).  Just as we do with many in-office visits, in order for you to participate in this visit, we must obtain consent.  If you'd like, I can send this to your mychart (if signed up) or email for you to review.  Otherwise, I can obtain your verbal consent now.  All virtual visits are billed to your insurance company just like a normal visit would be.  By agreeing to a virtual visit, we'd like you to understand that the technology does not allow for your provider to perform an examination, and thus may limit your provider's ability to fully assess your condition. If your provider identifies any concerns that need to be evaluated in person, we will make arrangements to do so.  Finally, though the technology is pretty good, we cannot assure that it will always work on either your or our end, and in the setting of a video visit, we may have to convert it to a phone-only visit.  In either situation, we cannot ensure that we have a secure connection.  Are you willing to proceed?" STAFF: Did the patient verbally acknowledge consent to telehealth visit? Document YES/NO here: YES   TELEPHONE CALL NOTE  Charlene Dickerson has been deemed a candidate for a follow-up tele-health visit to limit community exposure during the Covid-19 pandemic. I spoke with the patient via phone to ensure availability of phone/video source, confirm preferred email & phone number, and discuss instructions and expectations.  I reminded Charlene Dickerson to be prepared with any vital sign and/or heart rhythm information that could potentially be obtained via home monitoring, at the time of her visit. I reminded Charlene Dickerson to expect a phone call prior to her visit.   [name], CMA 06/30/2018 12:10 PM  FULL LENGTH CONSENT FOR TELE-HEALTH VISIT   I hereby voluntarily request, consent and authorize [facility] and its employed or contracted physicians, physician assistants, nurse practitioners or other licensed health care professionals (the Practitioner), to provide me with telemedicine health care services (the "Services") as deemed necessary by the treating Practitioner. I acknowledge and consent to receive the Services by the Practitioner via telemedicine. I understand that the telemedicine visit will involve communicating with the Practitioner through live audiovisual communication technology and the disclosure of certain medical information by electronic transmission. I acknowledge that I have been given the opportunity to request an in-person assessment or other available alternative prior to the telemedicine visit and am voluntarily participating in the telemedicine visit.  I understand that I have the right to withhold or withdraw my consent to the use of telemedicine in the course of my care at any time, without affecting my right to future care or treatment, and that the Practitioner or I may terminate the telemedicine visit at any time. I understand that I have the right to inspect all information obtained and/or recorded in the course of the telemedicine visit and may receive copies of available information for a reasonable fee.  I understand that some of the potential risks of receiving the Services via telemedicine include:  Marland Kitchen Delay or interruption in medical evaluation due to technological equipment failure or disruption; . Information transmitted may not  be sufficient (e.g. poor resolution of images) to allow for appropriate medical decision making by the Practitioner; and/or  . In rare instances, security protocols could fail, causing a breach of personal health information.  Furthermore, I acknowledge that it is my responsibility to provide  information about my medical history, conditions and care that is complete and accurate to the best of my ability. I acknowledge that Practitioner's advice, recommendations, and/or decision may be based on factors not within their control, such as incomplete or inaccurate data provided by me or distortions of diagnostic images or specimens that may result from electronic transmissions. I understand that the practice of medicine is not an exact science and that Practitioner makes no warranties or guarantees regarding treatment outcomes. I acknowledge that I will receive a copy of this consent concurrently upon execution via email to the email address I last provided but may also request a printed copy by calling the office of [facility].    I understand that my insurance will be billed for this visit.   I have read or had this consent read to me. . I understand the contents of this consent, which adequately explains the benefits and risks of the Services being provided via telemedicine.  . I have been provided ample opportunity to ask questions regarding this consent and the Services and have had my questions answered to my satisfaction. . I give my informed consent for the services to be provided through the use of telemedicine in my medical care  By participating in this telemedicine visit I agree to the above.

## 2018-07-05 NOTE — Progress Notes (Signed)
Virtual Visit via Video Note   This visit type was conducted due to national recommendations for restrictions regarding the COVID-19 Pandemic (e.g. social distancing) in an effort to limit this patient's exposure and mitigate transmission in our community.  Due to her co-morbid illnesses, this patient is at least at moderate risk for complications without adequate follow up.  This format is felt to be most appropriate for this patient at this time.  All issues noted in this document were discussed and addressed.  A limited physical exam was performed with this format.  Please refer to the patient's chart for her consent to telehealth for [facility].   Date:  07/06/2018   ID:  Charlene Dickerson, DOB 11/22/1932, MRN [id number]  Patient Location: Home Provider Location: Home  PCP:  Marton Redwood, MD  Cardiologist:  Mertie Moores, MD   Electrophysiologist:  None   Evaluation Performed:  Follow-Up Visit  Chief Complaint: CAD, heart failure, atrial fibrillation  History of Present Illness:    Charlene Dickerson is a 85 y.o. female with coronary artery disease, HFpEF, persistent atrial fibrillation.  She was admitted in 01/2018 with an inferior STEMI complicated by complete heart block and cardiogenic shock requiring intubation and temporary transvenous pacing.  In Feb 2020, she was noted to be in atrial fibrillation and she was volume overloaded.  She underwent DCCV but had early return of atrial fibrillation.  She was last seen in he office by Kathyrn Drown, NP in 05/2018.  She was back in atrial fibrillation with controlled rate.  At that time, she seemed to be asymptomatic and a rate control strategy was pursued.     Today, she notes she continues to have issues with lower extremity swelling.  She has had some improvement since starting on Lasix.  She does not have palpitations.  She does not have orthopnea or paroxysmal nocturnal dyspnea.  She walks with a friend who has noticed that she has been  short of breath at times during her exercise.  She has not had chest discomfort, syncope.  She had some bleeding from a place on her leg in the shower couple of weeks ago.  She has not had any further bleeding.  The patient does not have symptoms concerning for COVID-19 infection (fever, chills, cough, or new shortness of breath).    Past Medical History:  Diagnosis Date  . Diverticulosis of colon   . Essential tremor   . GERD (gastroesophageal reflux disease)    occasional (no meds)  . History of basal cell carcinoma excision 2012  . Hypertension   . Osteoporosis   . PONV (postoperative nausea and vomiting)   . Rectocele    symptomatic   Past Surgical History:  Procedure Laterality Date  . BREAST EXCISIONAL BIOPSY Left 1995  . BREAST EXCISIONAL BIOPSY Right 1988  . CARDIOVERSION N/A 04/16/2018   Procedure: CARDIOVERSION;  Surgeon: Elouise Munroe, MD;  Location: [facility] ENDOSCOPY;  Service: Cardiovascular;  Laterality: N/A;  . CATARACT EXTRACTION W/ INTRAOCULAR LENS  IMPLANT, BILATERAL  right 09/ 2013;  left 10/ 2013  . COLONOSCOPY  last one 04/10/ 2012  . CORONARY STENT INTERVENTION N/A 02/18/2018   Procedure: CORONARY STENT INTERVENTION;  Surgeon: Lorretta Harp, MD;  Location: [place] CV LAB;  Service: Cardiovascular;  Laterality: N/A;  . D & C HYSTEROSCOPY W/ RESECTION ENDOMETRIAL POLYP AND LESION  07-27-2007   dr Ulanda Edison _0   . LEFT HEART CATH AND CORONARY ANGIOGRAPHY N/A 02/18/2018   Procedure:  LEFT HEART CATH AND CORONARY ANGIOGRAPHY;  Surgeon: Lorretta Harp, MD;  Location: [place] CV LAB;  Service: Cardiovascular;  Laterality: N/A;  . RECTOCELE REPAIR N/A 11/26/2017   Procedure: POSTERIOR REPAIR (RECTOCELE);  Surgeon: Newton Pigg, MD;  Location: [facility];  Service: Gynecology;  Laterality: N/A;  OUTPT IN BED  . TEMPORARY PACEMAKER N/A 02/18/2018   Procedure: TEMPORARY PACEMAKER;  Surgeon: Lorretta Harp, MD;  Location: [place] CV LAB;   Service: Cardiovascular;  Laterality: N/A;  . TUBAL LIGATION Bilateral yrs ago     Current Meds  Medication Sig  . acetaminophen (TYLENOL) 500 MG tablet Take 1,000 mg by mouth every 6 (six) hours as needed (pain.).   Marland Kitchen ALPRAZolam (XANAX) 0.5 MG tablet Take 0.25-0.5 tablets by mouth 2 (two) times daily as needed for anxiety.   Marland Kitchen apixaban (ELIQUIS) 5 MG TABS tablet Take 1 tablet (5 mg total) by mouth 2 (two) times daily.  Marland Kitchen atorvastatin (LIPITOR) 40 MG tablet Take 1 tablet (40 mg total) by mouth daily at 6 PM.  . calcium citrate (CALCITRATE - DOSED IN MG ELEMENTAL CALCIUM) 950 MG tablet Take 200 mg of elemental calcium by mouth daily.  . clopidogrel (PLAVIX) 75 MG tablet Take 1 tablet (75 mg total) by mouth daily.  . famotidine (PEPCID AC) 10 MG tablet Take 10 mg by mouth as needed for heartburn or indigestion.  . fluticasone (FLONASE) 50 MCG/ACT nasal spray Place 1 spray into both nostrils 2 (two) times daily as needed for allergies or rhinitis.   Marland Kitchen latanoprost (XALATAN) 0.005 % ophthalmic solution Place 1 drop into both eyes at bedtime.   Marland Kitchen loratadine (CLARITIN) 10 MG tablet Take 10 mg by mouth daily.  . Multiple Vitamin (MULTIVITAMIN WITH MINERALS) TABS tablet Take 1 tablet by mouth daily.  . nitroGLYCERIN (NITROSTAT) 0.4 MG SL tablet Place 1 tablet (0.4 mg total) under the tongue every 5 (five) minutes as needed for chest pain.  . pantoprazole (PROTONIX) 40 MG tablet TAKE 1 TABLET BY MOUTH EVERYDAY AT BEDTIME  . potassium chloride (K-DUR,KLOR-CON) 10 MEQ tablet Take 2 tablets (20 mEq total) by mouth 2 (two) times daily.  . propranolol (INDERAL) 10 MG tablet Take 10 mg by mouth 2 (two) times daily. (2) 1 times daily   . simethicone (MYLICON) 80 MG chewable tablet Chew 160 mg by mouth every 6 (six) hours as needed for flatulence.  . Vitamin D, Ergocalciferol, (DRISDOL) 1.25 MG (50000 UT) CAPS capsule Take 50,000 Units by mouth every Wednesday.   . [DISCONTINUED] aspirin 81 MG chewable tablet  Chew 1 tablet (81 mg total) by mouth daily.  . [DISCONTINUED] furosemide (LASIX) 20 MG tablet Take 2 tablets (40 mg total) by mouth daily.     Allergies:   Clindamycin/lincomycin   Social History   Tobacco Use  . Smoking status: Former Smoker    Last attempt to quit: 05/08/1961    Years since quitting: 57.2  . Smokeless tobacco: Never Used  Substance Use Topics  . Alcohol use: No  . Drug use: Never     Family Hx: The patient's family history includes Breast cancer in her daughter, maternal aunt, maternal grandmother, and sister; Cancer in her daughter, maternal aunt, maternal grandmother, and sister; Heart disease in her father and sister.  ROS:   Please see the history of present illness.    All other systems reviewed and are negative.   Prior CV studies:   The following studies were reviewed today:  Echo 02/19/18 EF 50-55, inf HK, mild AI, MAC, severely reduced RVSF, mod RVE, severe RAE, atrial septal aneurysm, PASP 39  Cardiac Catheterization 02/18/18 LAD prox 50  LCx prox 80 RCA prox 99, mid 100 PCI:  3 x 12 mm Synergy DES to the proximal RCA  PCI:  2.5 x 8 mm Synergy DES to the mid RCA          Labs/Other Tests and Data Reviewed:    EKG:  No ECG reviewed.  Recent Labs: 02/23/2018: ALT 580 02/24/2018: Magnesium 1.9 04/12/2018: Hemoglobin 12.3; Platelets 204 05/03/2018: BUN 23; Creatinine, Ser 1.02; Potassium 4.5; Sodium 139   Recent Lipid Panel Lab Results  Component Value Date/Time   CHOL 168 02/18/2018 02:05 PM   TRIG 70 02/18/2018 02:05 PM   HDL 62 02/18/2018 02:05 PM   CHOLHDL 2.7 02/18/2018 02:05 PM   LDLCALC 92 02/18/2018 02:05 PM       Wt Readings from Last 3 Encounters:  07/06/18 152 lb 3.2 oz (69 kg)  05/03/18 150 lb (68 kg)  04/12/18 154 lb 14.1 oz (70.3 kg)     Objective:    Vital Signs:  BP 128/82   Pulse 84   Temp 98 F (36.7 C)   Ht _0  (1.575 m)   Wt 152 lb 3.2 oz (69 kg)   BMI 27.84 kg/m    VITAL SIGNS:  reviewed  GEN:  no acute distress EYES:  sclerae anicteric, EOMI - Extraocular Movements Intact CARDIOVASCULAR:  lower extremity edema noted NEURO:  alert and oriented x 3, no obvious focal deficit PSYCH:  normal affect  ASSESSMENT & PLAN:    Persistent atrial fibrillation Heart rate seems to be well controlled.  She is tolerating anticoagulation.  Continue rate control strategy.  Continue Apixaban.  Arrange follow-up labs as outlined below.  Coronary artery disease involving native coronary artery of native heart without angina pectoris History of inferior MI in December 7035 complicated by complete heart block and cardiogenic shock.  She had DES x2 to the RCA.  She has residual disease in the LCx and LAD which has been treated medically.  She is not currently having angina.  She is currently on aspirin, clopidogrel in addition to Apixaban.  It has been more than 30 days since her myocardial infarction.  Therefore, aspirin can be discontinued.  -DC aspirin  -Continue Clopidogrel, atorvastatin, propranolol.  Chronic heart failure with preserved ejection fraction (HCC) / Shortness of breath She continues to have issues with lower extremity swelling.  EF at the time of her myocardial infarction was normal.  She did have severely reduced RV systolic function.  This was likely related to her inferior MI and RV infarct.  She does have evidence of varicose veins on the limited exam today.  I suspect venous insufficiency may be contributing to some of her swelling.  She still has some shortness of breath with exertion.    -Increase Lasix to 60 mg daily  -Compression stockings daily  -Obtain BMET, BNP, CBC in 1 week  -Arrange home visit in 1 week for labs and exam  Essential hypertension The patient's blood pressure is controlled on her current regimen.  Continue current therapy.   Hyperlipidemia, unspecified hyperlipidemia type Continue high-dose atorvastatin.  Obtain LFTs with labs as noted above.   COVID-19 Education: The signs and symptoms of COVID-19 were discussed with the patient and how to seek care for testing (follow up with PCP or arrange E-visit).   The importance of social  distancing was discussed today.  Time:   Today, I have spent 28 minutes with the patient with telehealth technology discussing the above problems.     Medication Adjustments/Labs and Tests Ordered: Current medicines are reviewed at length with the patient today.  Concerns regarding medicines are outlined above.   Tests Ordered: Orders Placed This Encounter  Procedures  . Basic metabolic panel  . Pro b natriuretic peptide (BNP)  . CBC  . Hepatic function panel    Medication Changes: Meds ordered this encounter  Medications  . furosemide (LASIX) 20 MG tablet    Sig: Take 3 tablets (60 mg total) by mouth daily.    Dispense:  90 tablet    Refill:  11    Dose increase    Order Specific Question:   Supervising Provider    Answer:   Lelon Perla [[id number]]    Disposition:  Follow up in 3 month(s)  Signed, [name], PA-C  07/06/2018 5:00 PM    [place]

## 2018-07-06 ENCOUNTER — Encounter: Payer: Self-pay | Admitting: Physician Assistant

## 2018-07-06 ENCOUNTER — Telehealth: Payer: Self-pay | Admitting: Physician Assistant

## 2018-07-06 ENCOUNTER — Other Ambulatory Visit: Payer: Self-pay

## 2018-07-06 ENCOUNTER — Telehealth (INDEPENDENT_AMBULATORY_CARE_PROVIDER_SITE_OTHER): Payer: Medicare Other | Admitting: Physician Assistant

## 2018-07-06 VITALS — BP 128/82 | HR 84 | Temp 98.0°F | Ht 62.0 in | Wt 152.2 lb

## 2018-07-06 DIAGNOSIS — R0602 Shortness of breath: Secondary | ICD-10-CM

## 2018-07-06 DIAGNOSIS — Z7189 Other specified counseling: Secondary | ICD-10-CM

## 2018-07-06 DIAGNOSIS — I4819 Other persistent atrial fibrillation: Secondary | ICD-10-CM

## 2018-07-06 DIAGNOSIS — E785 Hyperlipidemia, unspecified: Secondary | ICD-10-CM

## 2018-07-06 DIAGNOSIS — I10 Essential (primary) hypertension: Secondary | ICD-10-CM

## 2018-07-06 DIAGNOSIS — I2510 Atherosclerotic heart disease of native coronary artery without angina pectoris: Secondary | ICD-10-CM

## 2018-07-06 DIAGNOSIS — I5032 Chronic diastolic (congestive) heart failure: Secondary | ICD-10-CM

## 2018-07-06 MED ORDER — FUROSEMIDE 20 MG PO TABS: 60.0000 mg | ORAL_TABLET | Freq: Every day | ORAL | 11 refills | Status: DC

## 2018-07-06 NOTE — Telephone Encounter (Signed)
Lab Requisitions:  Label Here   [facility]  E-Req  [place]           [facility] TM E-Req [facility] Page 1 of 1  Account #: [id number] Collection Date:       Req/Control #: [id number] Collection Time:       Client / Ordering Site Information: Physician Information:  Account Name: [facility] Office  Address 1: [address]  Address 2:   [place], [place] Zip: [place], [place] [address]  Phone: [phone]   Ordering: Beatrice Lecher  Degree: PA-[place]  NPI: [id number]  UPIN: [id number]  Physician ID:       Patient Information:  Name: Charlene Dickerson  Gender: Female  Date of Birth: 08/29/1932  Age: 86 years  Address: [address], State Zip: [place], [place] [address]   SSN: [id number]  Patient ID: [id number]  Phone: [phone]     Alt Control#: [id number]           Comments:      Order Code Tests Ordered (Total: 1)   Order Code Tests Ordered    [id number] Hepatic function panel             Diagnosis Codes:  E78.5         Bill Type: Third-Party   Carrier Code MAP         Responsible Party / Guarantor Information:  Name: [employer]  Address: [address], State Zip: [place], [place] [address]  Phone: [phone]  Relation to Pt: Self  Employer Name:       ABN:    Worker's Comp: N Date of Injury:         Insurance Information:  Primary Insurance: Secondary Insurance:  Ins Co Name: [facility] MEDICARE  Address 1: [address] [address]  Address 2:   [place], [place]ann [place], [place] [address]  Policy Number: [id number]  Group #: [id number]   Ins Co Name:   Address 1:   Address 2:   [place], [place] Zip:   Policy Number:   Group #:     Programmer, systems / Dickerson: Charlene Dickerson:  Name: Charlene Dickerson  Address: [address], [place] [address]  Pt Relation to Subscriber: Self   Name:   Address:      Pt Relation to Subscriber:       Authorization -  Please Sign and Date I hereby authorize the release of medical information related to the services described hereon and authorize payment directly to [employer] [facility] [place].   __________________________________                    ______________ Patient Signature                                                                    Date   __________________________________                     ______________ Physician Signature  Date     Charlene Dickerson  1933-01-01 [id number]  MRN: [id number]  Coll Dt/Time: -    Charlene Dickerson  1932-11-10 [id number]  MRN: [id number]  Coll Dt/Time: -    Charlene Dickerson  1932/07/26 [id number]  MRN: [id number]  Coll Dt/Time: -    Charlene Dickerson  [birth year] [id number]  MRN: [id number]  Coll Dt/Time: -     Charlene Dickerson  October 26, 1932 [id number]  MRN: [id number]  Coll Dt/Time: -    Charlene Dickerson  06-20-1932 [id number]  MRN: [id number]  Coll Dt/Time: Charlene Dickerson  1933-01-21 [id number]  MRN: [id number]  Coll Dt/Time: Charlene Dickerson  [birth year] [id number]  MRN: [id number]  Coll Dt/Time: -             Label Here   [facility]  E-Req  [place]           [facility] TM E-Req -  Page 1 of 1  Account #: [id number] Collection Date:       Req/Control #: [id number] Collection Time:       Client / Ordering Site Information: Physician Information:  Account Name: [facility]  Address 1: [address], [address]  Address 2:   [place], [place] Zip: [place], [place] [address]  Phone: [phone]   Ordering: Beatrice Lecher  Degree: PA-Dickerson  NPI: [id number]  UPIN: [id number]  Physician ID:       Patient Information:  Name: Charlene Dickerson  Gender: Female  Date of Birth: 1932/08/26  Age: 34 years  Address: [address], State Zip: [place], [place] [address]   SSN: [id number]  Patient ID: [id number]  Phone: [phone]      Alt Control#: [id number]           Comments:      Order Code Tests Ordered (Total: 1)   Order Code Tests Ordered    [id number] Basic metabolic panel             Diagnosis Codes:  I48.19         Bill Type: Third-Party   Carrier Code MAP         Responsible Party / Guarantor Information:  Name: [facility] [place]  Address: [address], State Zip: [place], [place] [address]  Phone: [phone]  Relation to Pt: Self  Employer Name:       ABN:    Worker's Comp: N Date of Injury:         Insurance Information:  Primary Insurance: Secondary Insurance:  Ins Co Name: [facility] MEDICARE  Address 1: [address] [address]  Address 2:   [place], [place]ann [place], [place] [address]  Policy Number: [id number]  Group #: [id number]   Ins Co Name:   Address 1:   Address 2:   [place], [place] Zip:   Policy Number:   Group #:     Programmer, systems / Dickerson: Charlene Dickerson:  Name: Charlene Dickerson  Address: [address], [place] [address]  Pt Relation to Subscriber: Self   Name:   Address:      Pt Relation to Subscriber:       Authorization - Please Sign and Date I hereby authorize the release of medical information related to the services described hereon and authorize payment directly to [employer] [place].   __________________________________  ______________ Patient Signature                                                                    Date   __________________________________                     ______________ Physician Signature                                                                Date     Charlene Dickerson  [birth year] [id number]  MRN: [id number]  Coll Dt/Time: Charlene Dickerson  November 04, 1932 [id number]  MRN: [id number]  Coll Dt/Time: -    Charlene Dickerson  Dec 23, 1932 [id number]  MRN: [id number]  Coll Dt/Time: -    Charlene Dickerson  02-20-1933 [id number]  MRN:  [id number]  Coll Dt/Time: -     Charlene Dickerson  1933/01/01 [id number]  MRN: [id number]  Coll Dt/Time: -    Charlene Dickerson  06-Aug-1932 [id number]  MRN: [id number]  Coll Dt/Time: Charlene Dickerson  March 16, 1932 [id number]  MRN: [id number]  Coll Dt/Time: ANYKA, STIKA  [birth year] [id number]  MRN: [id number]  Coll Dt/Time: -             Label Here   [facility]  E-Req  [place]           [facility] TM E-Req - [place] Page 1 of 1  Account #: [id number] Collection Date:       Req/Control #: [id number] Collection Time:       Client / Ordering Site Information: Physician Information:  Account Name: [facility]  Address 1: [address], [address]  Address 2:   [place], [place] Zip: [place], [place] [address]  Phone: [phone]   Ordering: Beatrice Lecher  Degree: PA-Dickerson  NPI: [id number]  UPIN: [id number]  Physician ID:       Patient Information:  Name: Charlene Dickerson  Gender: Female  Date of Birth: Oct 08, 1932  Age: 4 years  Address: [address] State Zip: [place], [place] [address]   SSN: [id number]  Patient ID: [id number]  Phone: [phone]     Alt Control#: [id number]           Comments:      Order Code Tests Ordered (Total: 1)   Order Code Tests Ordered    143000 Pro b natriuretic peptide (BNP)             Diagnosis Codes:  I48.19         Bill Type: Third-Party   Carrier Code MAP         Responsible Party / Guarantor Information:  Name: Charlene Dickerson  Address: [address], State Zip: [place], [place] [address]  Phone: [phone]  Relation to Pt: Self  Employer Name:       ABN:    Worker's Comp: N Date of Injury:         Insurance Information:  Primary Insurance: Secondary Insurance:  Ins Co Name: [facility]  Address 1: [address] [address]  Address 2:   [place], [place]: [place], [place] [address]  Policy Number: [id number]  Group #: [id number]   Ins Co Name:    Address 1:   Address 2:   [place], [place] Zip:   Policy Number:   Group #:     Programmer, systemsrimary Policy Holder / [place]: Astronomerecondary Policy Holder / [place]:  Name: Charlene FootMATHIS,Jonalyn [place]  [address]   [place], [place] [address]  Pt Relation to Subscriber: Self   Name:   Address:      Pt Relation to Subscriber:       Authorization - Please Sign and Date I hereby authorize the release of medical information related to the services described hereon and authorize payment directly to [employer] [facility] [place].   __________________________________                    ______________ Patient Signature                                                                    Date   __________________________________                     ______________ Physician Signature                                                                Date     Selke,Jlyn [place]erson  1932-07-26 [id number]  MRN: [id number]  Coll Dt/Time: Charlene Dickerson-    Freimuth,Bhavika [place]  1932-07-26 [id number]  MRN: [id number]  Coll Dt/Time: Charlene Dickerson-    Strout,Fritzi [place]erson  1932-07-26 [id number]  MRN: [id number]  Coll Dt/Time: Charlene Dickerson-    Minar,Mariposa [place]  1932-07-26 [id number]  MRN: [id number]  Coll Dt/Time: Charlene Dickerson-     Cannella,Eileen [place]  1932-07-26 [id number]  MRN: [id number]  Coll Dt/Time: Charlene Dickerson-    Yaney,Richard [place]  1932-07-26 [id number]  MRN: [id number]  Coll Dt/Time: Charlene Dickerson-    Schwan,Kamali [place]  1932-07-26 [id number]  MRN: [id number]  Coll Dt/Time: Charlene Dickerson-    Eves,Dawne [place]  1932-07-26 [id number]  MRN: [id number]  Coll Dt/Time: -             Label Here   [facility]  E-Req  [place]           [facility] TM E-Req - [place] Page 1 of 1  Account #: [id number] Collection Date:       Req/Control #: [id number] Collection Time:       Client / Ordering Site Information: Physician Information:  [facility]  Address 1: [address], [address]  Address 2:   [place], [place]: [place], [place] [address]  Phone: [phone]    Ordering: Beatrice LecherWeaver, [place] T  Degree: PA-Dickerson  NPI: [id number]  UPIN: [id number]  Physician ID:       Patient Information:  Name: Charlene FootMATHIS,Izetta [place]  Gender: Female  Date of Birth: 1932-07-26  Age: [age] years  Address: [address]  [address] State Zip: [place], [place] [address]   SSN: [id number]  Patient ID: [id number]  Phone: [phone]     Alt Control#: [id number]           Comments:      Order Code Tests Ordered (Total: 1)   Order Code Tests Ordered    [phone] CBC             Diagnosis Codes:  I50.32 R06.02        Bill Type: Third-Party   Carrier Code MAP         Responsible Party / Guarantor Information:  Name: Charlene Dickerson  Address: [address], State Zip: [place], [place] [address]  Phone: [phone]  Relation to Pt: Self  Employer Name:       ABN:    Worker's Comp: N Date of Injury:         Insurance Information:  Primary Insurance: Secondary Insurance:  Ins Co Name: [facility] MEDICARE  Address 1: [address] [address]  Address 2:   [place], [place]ann [place], [place] [address]  Policy Number: [id number]  Group #: [id number]   Ins Co Name:   Address 1:   Address 2:   [place], [place] Zip:   Policy Number:   Group #:     Programmer, systems / Dickerson: Charlene Dickerson:  Name: Charlene Dickerson  Address: [address], [place] [address]  Pt Relation to Subscriber: Self   Name:   Address:      Pt Relation to Subscriber:       Authorization - Please Sign and Date I hereby authorize the release of medical information related to the services described hereon and authorize payment directly to [facility] [place].   __________________________________                    ______________ Patient Signature                                                                    Date   __________________________________                     ______________ Physician Signature                                                                 Date     Castrellon,Rheanne [place]  10-Oct-1932 [id number]  MRN: [id number]  Coll Dt/Time: Charlene Dickerson  [birth year] [id number]  MRN: [id number]  Coll Dt/Time: -    Paolo,Chanin [place]  09-20-1932 [id number]  MRN: [id number]  Coll Dt/Time: -    Petrak,Safari [place]  09-Dec-1932 [id number]  MRN: [id number]  Coll Dt/Time: -     Negrette,Raylin [place]erson  Oct 20, 1932 [id number]  MRN: [id number]  Coll Dt/Time: -    Bocanegra,Lidya [place]erson  [birth year] [id number]  MRN: [id number]  Coll Dt/Time: -    Mogg,Merridy Dickerson  [birth year] [id number]  MRN: [id number]  Coll Dt/Time: -    Ironside,Sheridyn Dickerson  Aug 09, 1932 [id number]  MRN: [id number]  Coll Dt/Time: -

## 2018-07-06 NOTE — Patient Instructions (Signed)
Medication Instructions:   Increase Lasix (Furosemide) to 60 mg once daily (take 3 of the 20 mg tablets to = 60 mg)  STOP taking Aspirin  If you need a refill on your cardiac medications before your next appointment, please call your pharmacy.   Lab work:   I will arrange for a home visit to get blood work in 1 week (BMET, CBC, BNP)  If you have labs (blood work) drawn today and your tests are completely normal, you will receive your results only by: Marland Kitchen MyChart Message (if you have MyChart) OR . A paper copy in the mail If you have any lab test that is abnormal or we need to change your treatment, we will call you to review the results.  Testing/Procedures: None   Follow-Up: At [facility], you and your health needs are our priority.  As part of our continuing mission to provide you with exceptional heart care, we have created designated Provider Care Teams.  These Care Teams include your primary Cardiologist (physician) and Advanced Practice Providers (APPs -  Physician Assistants and Nurse Practitioners) who all work together to provide you with the care you need, when you need it. You will need a follow up appointment in:  3 months.  Please call our office 2 months in advance to schedule this appointment.  You may see Kristeen Miss, MD or Tereso Newcomer, PA-C   Any Other Special Instructions Will Be Listed Below (If Applicable).   Try to wear compression stockings daily.  This will help with your swelling.

## 2018-07-06 NOTE — Telephone Encounter (Signed)
[facility] Home Visit Request  Agency Requested:  [facility] Contact:  Curly Rim, NP Phone #:  [phone] Fax #:  [phone]  Patient Information: Name:  DAVIN GRIFFIN  DOB:  [birth year]  MRN:  [id number]  Address:   [address] [place] [address]  Phone Numbers:   Home Phone [phone]  Mobile [phone]     Requesting Provider:   Tereso Newcomer, PA-C   Diagnosis:   AFib, CHF, CAD  Reason for Visit:   Labs, exam - she is on Lasix for HFpEF; I am increasing her Lasix from 40 mg to 60 mg once daily.  Her leg swelling is probably also related to venous insufficiency.  Please draw labs, examine legs and lungs and get VS.  Services Requested:  Vital Signs (BP, Pulse, O2, Weight)  Physical Exam  Labs:  BMET, CBC, LFTs, NT pro BNP  # of Visits Needed/Frequency per Week: 1 visit

## 2018-07-08 ENCOUNTER — Other Ambulatory Visit: Payer: Self-pay | Admitting: Physician Assistant

## 2018-07-09 LAB — HEPATIC FUNCTION PANEL
ALT: 30 IU/L (ref 0–32)
AST: 30 IU/L (ref 0–40)
Albumin: 4.3 g/dL (ref 3.6–4.6)
Alkaline Phosphatase: 78 IU/L (ref 39–117)
Bilirubin Total: 0.6 mg/dL (ref 0.0–1.2)
Bilirubin, Direct: 0.2 mg/dL (ref 0.00–0.40)
Total Protein: 6.8 g/dL (ref 6.0–8.5)

## 2018-07-09 LAB — BASIC METABOLIC PANEL
BUN/Creatinine Ratio: 21 (ref 12–28)
BUN: 22 mg/dL (ref 8–27)
CO2: 23 mmol/L (ref 20–29)
Calcium: 9.1 mg/dL (ref 8.7–10.3)
Chloride: 101 mmol/L (ref 96–106)
Creatinine, Ser: 1.06 mg/dL — ABNORMAL HIGH (ref 0.57–1.00)
GFR calc Af Amer: 56 mL/min/{1.73_m2} — ABNORMAL LOW (ref >59)
GFR calc non Af Amer: 49 mL/min/{1.73_m2} — ABNORMAL LOW (ref >59)
Glucose: 95 mg/dL (ref 65–99)
Potassium: 4.4 mmol/L (ref 3.5–5.2)
Sodium: 140 mmol/L (ref 134–144)

## 2018-07-09 LAB — PRO B NATRIURETIC PEPTIDE: NT-Pro BNP: 2604 pg/mL — ABNORMAL HIGH (ref 0–738)

## 2018-07-09 LAB — CBC WITH DIFFERENTIAL/PLATELET

## 2018-07-12 NOTE — Progress Notes (Signed)
See labs from 07/12/2018. Will need to adjust Lasix. Tereso Newcomer, PA-C    07/12/2018 5:43 PM

## 2018-07-13 ENCOUNTER — Telehealth: Payer: Self-pay | Admitting: Physician Assistant

## 2018-07-13 ENCOUNTER — Other Ambulatory Visit: Payer: Self-pay | Admitting: Internal Medicine

## 2018-07-13 DIAGNOSIS — R0602 Shortness of breath: Secondary | ICD-10-CM

## 2018-07-13 DIAGNOSIS — I5032 Chronic diastolic (congestive) heart failure: Secondary | ICD-10-CM

## 2018-07-13 DIAGNOSIS — Z1231 Encounter for screening mammogram for malignant neoplasm of breast: Secondary | ICD-10-CM

## 2018-07-13 NOTE — Telephone Encounter (Signed)
[facility] Home Visit Follow Up Request   Agency Requested:    [facility] Contact:  Alcide Clever, NP [address] [place], [place] [address] Phone #:  [phone] Fax #:  [phone]  Patient Demographic Information: Name:  Charlene Dickerson Age:  85 y.o.   DOB:  [birth year]  MRN:  [id number]   Requesting Provider:  Tereso Newcomer, PA-C     Home visit progress note(s), lab results, telemetry strips, etc were reviewed.  Provider Recommendations: Recent BNP elevated on labs and CBC that was originally ordered was not done.  I have increased her Lasix further to 40 mg twice daily for 3 days then 60 mg daily.  She needs repeat labs in 1 week.  Follow up home services requested:  Labs:  BMET, BNP, CBC   All labs ordered for this home visit have been released.

## 2018-07-13 NOTE — Telephone Encounter (Signed)
Label Here   [facility]  E-Req  [place]           [facility] TM E-Req [facility] Page 1 of 1  Account #: [id number] Collection Date:       Req/Control #: [id number] Collection Time:       Client / Ordering Site Information: Physician Information:  Account Name: [facility] Office  Address 1: [address]  Address 2:   [place], [place] Zip: [place], [place] [address]  Phone: [phone]   Ordering: Beatrice Lecher  Degree: PA-C  NPI: [id number]  UPIN: [id number]  Physician ID:       Patient Information:  Name: Charlene Dickerson  Gender: Female  Date of Birth: Jul 11, 1932  Age: 85 years  Address: [address] State Zip: [place], [place] [address]   SSN: [id number]  Patient ID: [id number]  Phone: [phone]     Alt Control#: [id number]           Comments:      Order Code Tests Ordered (Total: 3)   Order Code Tests Ordered    [phone] CBC  143000 Pro b natriuretic peptide (BNP)   937342 Basic metabolic panel          Diagnosis Codes:  R06.02 I50.32        Bill Type: Third-Party   Carrier Code          Responsible Party / Guarantor Information:  Name:   Address:   [place], [place] Zip:   Phone:   Relation to Pt:   Employer Name:       ABN:    Worker's Comp:  Date of Injury:         Community education officer Information:  Primary Insurance: Secondary Insurance:  Ins Co Name: [facility] MEDICARE  Address 1: [address] [address]  Address 2:   [place], [place], [place] [address]  Policy Number: [id number]  Group #: [id number]   Ins Co Name:   Address 1:   Address 2:   [place], [place] Zip:   Policy Number:   Group #:     Programmer, systems / Insured: Astronomer / Insured:  Name: Charlene Dickerson  Address: [address] [place], [place] [address]  Pt Relation to Subscriber: Self   Name:   Address:      Pt Relation to Subscriber:       Authorization - Please Sign and Date I hereby  authorize the release of medical information related to the services described hereon and authorize payment directly to [facility] [employer].   __________________________________                    ______________ Patient Signature                                                                    Date   __________________________________                     ______________ Physician Signature  Date     Gutmann,Alannah C  [birth year] [id number]  MRN: [id number]  Coll Dt/Time: -    Haymer,Kynleigh C  [birth year] [id number]  MRN: [id number]  Coll Dt/Time: -    Saltsman,Jeweline C  1932-09-25 [id number]  MRN: [id number]  Coll Dt/Time: -    Chittum,Ozzie C  Oct 26, 1932 [id number]  MRN: [id number]  Coll Dt/Time: -     Kearse,Kathaleen C  07/12/1932 [id number]  MRN: [id number]  Coll Dt/Time: -    Ricchio,Sarin C  1932/07/10 [id number]  MRN: [id number]  Coll Dt/Time: -    Goodine,Sharmila C  [birth year] [id number]  MRN: [id number]  Coll Dt/Time: -    Pickar,Aleyda C  19-Mar-1932 [id number]  MRN: [id number]  Coll Dt/Time: -

## 2018-07-14 ENCOUNTER — Telehealth: Payer: Self-pay | Admitting: *Deleted

## 2018-07-14 NOTE — Telephone Encounter (Signed)
SPOKE WITH PT  AND PT AWARE OF RESULTS AND FOR THREE DAYS TAKE 2  (20 MG)  LASIX TABLETS TWICE A DAY FOR 3 DAYS ONLY.  THEN RESUME BACK TO 60 MG A DAY   PT AWARE ANOTHER HOME VISIT WILL BE ARRANGED FOR LABS   PT WANTED TO KNOW IF SHE COULD KEEP HER 3 MONTHS AND JSUT FOLLOW UP LABS  RESULTS PT DOESN'T FEEL THE NEED FOR A TWO WEEK FOLLOW UP   PT TOLD WOULD ASK PROVIDER AND GET BACK WITH FEEDBACK

## 2018-07-20 ENCOUNTER — Telehealth: Payer: Self-pay | Admitting: *Deleted

## 2018-07-20 ENCOUNTER — Other Ambulatory Visit: Payer: Self-pay | Admitting: *Deleted

## 2018-07-20 NOTE — Telephone Encounter (Signed)
PT AWARE OF RECOMMENDATIONS  FOR NO  2 WEEK FOLLOW UP AND KEEP AUG APPT  MONITOR FLUID IN LIMBS CALL IF ANY CHANGES

## 2018-07-20 NOTE — Telephone Encounter (Signed)
Labs suggested continued volume excess.  Therefore, Lasix was adjusted. It is ok to keep follow up in August and not follow up again in 2 weeks. But, if swelling does not improve, weight increases more than 3 lbs in 1 day or she notes continued or worsening shortness of breath, she should call for earlier appointment. Tereso Newcomer, PA-C    07/20/2018 8:59 AM

## 2018-07-21 ENCOUNTER — Telehealth: Payer: Self-pay | Admitting: *Deleted

## 2018-07-21 LAB — BASIC METABOLIC PANEL
BUN/Creatinine Ratio: 21 (ref 12–28)
BUN: 22 mg/dL (ref 8–27)
CO2: 25 mmol/L (ref 20–29)
Calcium: 9.5 mg/dL (ref 8.7–10.3)
Chloride: 101 mmol/L (ref 96–106)
Creatinine, Ser: 1.06 mg/dL — ABNORMAL HIGH (ref 0.57–1.00)
GFR calc Af Amer: 56 mL/min/{1.73_m2} — ABNORMAL LOW (ref >59)
GFR calc non Af Amer: 49 mL/min/{1.73_m2} — ABNORMAL LOW (ref >59)
Glucose: 91 mg/dL (ref 65–99)
Potassium: 4.3 mmol/L (ref 3.5–5.2)
Sodium: 141 mmol/L (ref 134–144)

## 2018-07-21 LAB — PRO B NATRIURETIC PEPTIDE: NT-Pro BNP: 2553 pg/mL — ABNORMAL HIGH (ref 0–738)

## 2018-07-21 LAB — CBC
Hematocrit: 40.6 % (ref 34.0–46.6)
Hemoglobin: 14.2 g/dL (ref 11.1–15.9)
MCH: 32.1 pg (ref 26.6–33.0)
MCHC: 35 g/dL (ref 31.5–35.7)
MCV: 92 fL (ref 79–97)
Platelets: 172 10*3/uL (ref 150–450)
RBC: 4.42 x10E6/uL (ref 3.77–5.28)
RDW: 14.1 % (ref 11.7–15.4)
WBC: 4.4 10*3/uL (ref 3.4–10.8)

## 2018-07-21 NOTE — Telephone Encounter (Signed)
SPOKE TO PT ABOUT RESULTS AND VERBALIZED UNDERSTANDING   PT STATES SHE FEELS FINE NO SWELLING NO SOB  SAY THE LASIX HELPS. PT WENT AND GOT CBC DINE AT [address] THRU. PT STATES PCP MAILING RESULTS TO HOME  SHE WAS  STARTED ON KEFLEX  FROM PCP DUE TO BLOOD IN URINE AND STINGING.

## 2018-07-27 ENCOUNTER — Other Ambulatory Visit: Payer: Self-pay

## 2018-09-02 ENCOUNTER — Ambulatory Visit
Admission: RE | Admit: 2018-09-02 | Discharge: 2018-09-02 | Disposition: A | Discharge status: Home / Self Care | Payer: Medicare Other | Source: Ambulatory Visit | Attending: Internal Medicine | Admitting: Internal Medicine

## 2018-09-02 ENCOUNTER — Other Ambulatory Visit: Payer: Self-pay

## 2018-09-02 DIAGNOSIS — Z1231 Encounter for screening mammogram for malignant neoplasm of breast: Secondary | ICD-10-CM

## 2018-09-21 ENCOUNTER — Other Ambulatory Visit: Payer: Self-pay | Admitting: Cardiovascular Disease

## 2018-09-22 NOTE — Telephone Encounter (Signed)
Pt last saw [name], [place] on 07/06/18 telemedicine visit Covid-19, last labs 07/20/18 Creat 1.06, age 85, weight 68kg, based on specified criteria pt is on appropriate dosage of Eliquis 5mg  BID.  Will refill rx.

## 2018-09-23 ENCOUNTER — Other Ambulatory Visit: Payer: Self-pay | Admitting: Physician Assistant

## 2018-09-23 ENCOUNTER — Other Ambulatory Visit: Payer: Self-pay | Admitting: Cardiology

## 2018-09-23 NOTE — Telephone Encounter (Signed)
New Message   *STAT* If patient is at the pharmacy, call can be transferred to refill team.   1. Which medications need to be refilled? (please list name of each medication and dose if known)   atorvastatin (LIPITOR) 40 MG tablet    clopidogrel (PLAVIX) 75 MG tablet     2. Which pharmacy/location (including street and city if local pharmacy) is medication to be sent to?  [facility] [place], [place] [address] [place] [address]  3. Do they need a 30 day or 90 day supply? 90 day

## 2018-09-24 MED ORDER — ATORVASTATIN CALCIUM 40 MG PO TABS: 40.0000 mg | ORAL_TABLET | Freq: Every day | ORAL | 0 refills | Status: DC

## 2018-09-24 MED ORDER — CLOPIDOGREL BISULFATE 75 MG PO TABS: 75.0000 mg | ORAL_TABLET | Freq: Every day | ORAL | 0 refills | Status: DC

## 2018-09-24 NOTE — Telephone Encounter (Signed)
I have sent the pts Clopidogrel and Atorvastatin RXs to [facility] to fill as requested.

## 2018-10-13 ENCOUNTER — Ambulatory Visit: Payer: Medicare Other | Admitting: Physician Assistant

## 2018-10-13 NOTE — Progress Notes (Deleted)
Cardiology Office Note:    Date:  10/13/2018   ID:  Charlene Dickerson, DOB February 27, 1933, MRN [id number]  PCP:  Marton Redwood, MD  Cardiologist:  Mertie Moores, MD *** Electrophysiologist:  None   Referring MD: Marton Redwood, MD   No chief complaint on file. ***  History of Present Illness:    Charlene Dickerson is a 85 y.o. female with ***  Coronary artery disease  Status post inferior STEMI complicated by cardiogenic shock 12/19  PCI: DES to the proximal RCA and DES to the mid RCA  Residual CAD 12/19: Proximal LCx 80, proximal LAD 50 >> med Rx  Heart failure with preserved ejection fraction  Persistent atrial fibrillation  S/p DCCV >> ERAF >> rate control  Hypertension  Hyperlipidemia  Essential tremor  GERD   Ms. Sapp was last evaluated via telemedicine in May 2020.***  Prior CV studies:   The following studies were reviewed today:  *** Echo 02/19/18 EF 50-55, inf HK, mild AI, MAC, severely reduced RVSF, mod RVE, severe RAE, atrial septal aneurysm, PASP 39  Cardiac Catheterization 02/18/18 LAD prox 50  LCx prox 80 RCA prox 99, mid 100 PCI:  3 x 12 mm Synergy DES to the proximal RCA  PCI:  2.5 x 8 mm Synergy DES to the mid RCA     Past Medical History:  Diagnosis Date  . Diverticulosis of colon   . Essential tremor   . GERD (gastroesophageal reflux disease)    occasional (no meds)  . History of basal cell carcinoma excision 2012  . Hypertension   . Osteoporosis   . PONV (postoperative nausea and vomiting)   . Rectocele    symptomatic   Surgical Hx: The patient  has a past surgical history that includes Breast excisional biopsy (Left, 1995); Breast excisional biopsy (Right, 1988); D & C HYSTEROSCOPY W/ RESECTION ENDOMETRIAL POLYP AND LESION (07-27-2007   dr Ulanda Edison _0 ); Colonoscopy (last one 04/10/ 2012); Tubal ligation (Bilateral, yrs ago); Cataract extraction w/ intraocular lens  implant, bilateral (right 09/ 2013;  left 10/ 2013); Rectocele repair  (N/A, 11/26/2017); LEFT HEART CATH AND CORONARY ANGIOGRAPHY (N/A, 02/18/2018); TEMPORARY PACEMAKER (N/A, 02/18/2018); CORONARY STENT INTERVENTION (N/A, 02/18/2018); and Cardioversion (N/A, 04/16/2018).   Current Medications: No outpatient medications have been marked as taking for the 10/13/18 encounter (Appointment) with [name] T, PA-C.     Allergies:   Clindamycin/lincomycin   Social History   Tobacco Use  . Smoking status: Former Smoker    Quit date: 05/08/1961    Years since quitting: 57.4  . Smokeless tobacco: Never Used  Substance Use Topics  . Alcohol use: No  . Drug use: Never     Family Hx: The patient's family history includes Breast cancer in her daughter, maternal aunt, maternal grandmother, and sister; Cancer in her daughter, maternal aunt, maternal grandmother, and sister; Heart disease in her father and sister.  ROS:   Please see the history of present illness.    ROS All other systems reviewed and are negative.   EKGs/Labs/Other Test Reviewed:    EKG:  EKG is *** ordered today.  The ekg ordered today demonstrates ***  Recent Labs: 02/24/2018: Magnesium 1.9 07/08/2018: ALT 30 07/20/2018: BUN 22; Creatinine, Ser 1.06; Hemoglobin 14.2; NT-Pro BNP 2,553; Platelets 172; Potassium 4.3; Sodium 141   Recent Lipid Panel Lab Results  Component Value Date/Time   CHOL 168 02/18/2018 02:05 PM   TRIG 70 02/18/2018 02:05 PM   HDL 62 02/18/2018 02:05 PM  CHOLHDL 2.7 02/18/2018 02:05 PM   Union 92 02/18/2018 02:05 PM    Physical Exam:    VS:  There were no vitals taken for this visit.    Wt Readings from Last 3 Encounters:  07/06/18 152 lb 3.2 oz (69 kg)  05/03/18 150 lb (68 kg)  04/12/18 154 lb 14.1 oz (70.3 kg)     ***Physical Exam  ASSESSMENT & PLAN:    No diagnosis found.*** Persistent atrial fibrillation Heart rate seems to be well controlled.  She is tolerating anticoagulation.  Continue rate control strategy.  Continue Apixaban.  Arrange  follow-up labs as outlined below.  Coronary artery disease involving native coronary artery of native heart without angina pectoris History of inferior MI in December 3612 complicated by complete heart block and cardiogenic shock.  She had DES x2 to the RCA.  She has residual disease in the LCx and LAD which has been treated medically.  She is not currently having angina.  She is currently on aspirin, clopidogrel in addition to Apixaban.  It has been more than 30 days since her myocardial infarction.  Therefore, aspirin can be discontinued.             -DC aspirin             -Continue Clopidogrel, atorvastatin, propranolol.  Chronic heart failure with preserved ejection fraction (HCC) / Shortness of breath She continues to have issues with lower extremity swelling.  EF at the time of her myocardial infarction was normal.  She did have severely reduced RV systolic function.  This was likely related to her inferior MI and RV infarct.  She does have evidence of varicose veins on the limited exam today.  I suspect venous insufficiency may be contributing to some of her swelling.  She still has some shortness of breath with exertion.               -Increase Lasix to 60 mg daily             -Compression stockings daily             -Obtain BMET, BNP, CBC in 1 week             -Arrange home visit in 1 week for labs and exam  Essential hypertension The patient's blood pressure is controlled on her current regimen.  Continue current therapy.   Hyperlipidemia, unspecified hyperlipidemia type Continue high-dose atorvastatin.  Obtain LFTs with labs as noted above.  Dispo:  No follow-ups on file.   Medication Adjustments/Labs and Tests Ordered: Current medicines are reviewed at length with the patient today.  Concerns regarding medicines are outlined above.  Tests Ordered: No orders of the defined types were placed in this encounter.  Medication Changes: No orders of the defined types were placed  in this encounter.   Signed, [name], PA-C  10/13/2018 8:22 AM    [facility] [place], [place], [place]  [address] Phone: [phone]; Fax: [phone]

## 2018-10-20 ENCOUNTER — Other Ambulatory Visit: Payer: Self-pay

## 2018-10-20 ENCOUNTER — Telehealth (INDEPENDENT_AMBULATORY_CARE_PROVIDER_SITE_OTHER): Payer: Medicare Other | Admitting: Physician Assistant

## 2018-10-20 ENCOUNTER — Encounter: Payer: Self-pay | Admitting: Physician Assistant

## 2018-10-20 VITALS — BP 128/82 | HR 77 | Ht 62.0 in | Wt 152.0 lb

## 2018-10-20 DIAGNOSIS — I4819 Other persistent atrial fibrillation: Secondary | ICD-10-CM

## 2018-10-20 DIAGNOSIS — I5032 Chronic diastolic (congestive) heart failure: Secondary | ICD-10-CM

## 2018-10-20 DIAGNOSIS — I10 Essential (primary) hypertension: Secondary | ICD-10-CM

## 2018-10-20 DIAGNOSIS — I2510 Atherosclerotic heart disease of native coronary artery without angina pectoris: Secondary | ICD-10-CM

## 2018-10-20 DIAGNOSIS — Z7189 Other specified counseling: Secondary | ICD-10-CM

## 2018-10-20 MED ORDER — FUROSEMIDE 20 MG PO TABS: 40.0000 mg | ORAL_TABLET | Freq: Two times a day (BID) | ORAL | 3 refills | Status: DC

## 2018-10-20 NOTE — Progress Notes (Signed)
Virtual Visit via Telephone Note   This visit type was conducted due to national recommendations for restrictions regarding the COVID-19 Pandemic (e.g. social distancing) in an effort to limit this patient's exposure and mitigate transmission in our community.  Due to her co-morbid illnesses, this patient is at least at moderate risk for complications without adequate follow up.  This format is felt to be most appropriate for this patient at this time.  The patient did not have access to video technology/had technical difficulties with video requiring transitioning to audio format only (telephone).  All issues noted in this document were discussed and addressed.  No physical exam could be performed with this format.  Please refer to the patient's chart for her  consent to telehealth for [facility].   Date:  10/20/2018   ID:  Charlene Dickerson, DOB Feb 25, 1933, MRN [id number]  Patient Location: Home Provider Location: Home  PCP:  Marton Redwood, MD  Cardiologist:  Mertie Moores, MD   Electrophysiologist:  None   Evaluation Performed:  Follow-Up Visit  Chief Complaint: CHF, CAD, A. fib  History of Present Illness:    Charlene Dickerson is a 85 y.o. female with:  Coronary artery disease   S/p Inf STEMI 01/2018, c/b CHB, CGS  PCI: DES x 2 to RCA  HFpEF   Persistent atrial fibrillation   s/p DCCV >> ERAF  Hypertension  Hyperlipidemia   Essential tremor  Ms. Charlene Dickerson was last seen via Telemedicine in 07/2018.  Today, she notes she is doing well.  She has minimal shortness of breath going up hills.  She continues to have some swelling in her legs and ankles.  Overall, her swelling has improved.  She has not had orthopnea, paroxysmal nocturnal dyspnea, chest discomfort.  She has not had syncope or near syncope.  She had some mild redness in her lower extremities for months now.  The patient does not have symptoms concerning for COVID-19 infection (fever, chills, cough, or new shortness  of breath).    Past Medical History:  Diagnosis Date  . Diverticulosis of colon   . Essential tremor   . GERD (gastroesophageal reflux disease)    occasional (no meds)  . History of basal cell carcinoma excision 2012  . Hypertension   . Osteoporosis   . PONV (postoperative nausea and vomiting)   . Rectocele    symptomatic   Past Surgical History:  Procedure Laterality Date  . BREAST EXCISIONAL BIOPSY Left 1995  . BREAST EXCISIONAL BIOPSY Right 1988  . CARDIOVERSION N/A 04/16/2018   Procedure: CARDIOVERSION;  Surgeon: Elouise Munroe, MD;  Location: [facility] ENDOSCOPY;  Service: Cardiovascular;  Laterality: N/A;  . CATARACT EXTRACTION W/ INTRAOCULAR LENS  IMPLANT, BILATERAL  right 09/ 2013;  left 10/ 2013  . COLONOSCOPY  last one 04/10/ 2012  . CORONARY STENT INTERVENTION N/A 02/18/2018   Procedure: CORONARY STENT INTERVENTION;  Surgeon: Lorretta Harp, MD;  Location: [place] CV LAB;  Service: Cardiovascular;  Laterality: N/A;  . D & C HYSTEROSCOPY W/ RESECTION ENDOMETRIAL POLYP AND LESION  07-27-2007   dr Ulanda Edison _0   . LEFT HEART CATH AND CORONARY ANGIOGRAPHY N/A 02/18/2018   Procedure: LEFT HEART CATH AND CORONARY ANGIOGRAPHY;  Surgeon: Lorretta Harp, MD;  Location: [place] CV LAB;  Service: Cardiovascular;  Laterality: N/A;  . RECTOCELE REPAIR N/A 11/26/2017   Procedure: POSTERIOR REPAIR (RECTOCELE);  Surgeon: Newton Pigg, MD;  Location: [facility];  Service: Gynecology;  Laterality: N/A;  OUTPT  IN BED  . TEMPORARY PACEMAKER N/A 02/18/2018   Procedure: TEMPORARY PACEMAKER;  Surgeon: Lorretta Harp, MD;  Location: [place] CV LAB;  Service: Cardiovascular;  Laterality: N/A;  . TUBAL LIGATION Bilateral yrs ago     Current Meds  Medication Sig  . acetaminophen (TYLENOL) 500 MG tablet Take 1,000 mg by mouth every 6 (six) hours as needed (pain.).   Marland Kitchen ALPRAZolam (XANAX) 0.5 MG tablet Take 0.25-0.5 tablets by mouth 2 (two) times daily as needed  for anxiety.   Marland Kitchen atorvastatin (LIPITOR) 40 MG tablet Take 1 tablet (40 mg total) by mouth daily at 6 PM.  . calcium citrate (CALCITRATE - DOSED IN MG ELEMENTAL CALCIUM) 950 MG tablet Take 200 mg of elemental calcium by mouth daily.  . clopidogrel (PLAVIX) 75 MG tablet Take 1 tablet (75 mg total) by mouth daily.  Marland Kitchen ELIQUIS 5 MG TABS tablet TAKE 1 TABLET(5 MG) BY MOUTH TWICE DAILY  . famotidine (PEPCID AC) 10 MG tablet Take 10 mg by mouth as needed for heartburn or indigestion.  . fluticasone (FLONASE) 50 MCG/ACT nasal spray Place 1 spray into both nostrils 2 (two) times daily as needed for allergies or rhinitis.   . furosemide (LASIX) 20 MG tablet Take 2 tablets (40 mg total) by mouth 2 (two) times daily.  Marland Kitchen latanoprost (XALATAN) 0.005 % ophthalmic solution Place 1 drop into both eyes at bedtime.   Marland Kitchen loratadine (CLARITIN) 10 MG tablet Take 10 mg by mouth daily.  . Multiple Vitamin (MULTIVITAMIN WITH MINERALS) TABS tablet Take 1 tablet by mouth daily.  . nitroGLYCERIN (NITROSTAT) 0.4 MG SL tablet Place 1 tablet (0.4 mg total) under the tongue every 5 (five) minutes as needed for chest pain.  . pantoprazole (PROTONIX) 40 MG tablet TAKE 1 TABLET(40 MG TOTAL) BY MOUTH AT BEDTIME  . potassium chloride (K-DUR,KLOR-CON) 10 MEQ tablet Take 2 tablets (20 mEq total) by mouth 2 (two) times daily.  . propranolol (INDERAL) 10 MG tablet Take 10 mg by mouth 2 (two) times daily. (2) 1 times daily   . simethicone (MYLICON) 80 MG chewable tablet Chew 160 mg by mouth every 6 (six) hours as needed for flatulence.  . Vitamin D, Ergocalciferol, (DRISDOL) 1.25 MG (50000 UT) CAPS capsule Take 50,000 Units by mouth every Wednesday.   . [DISCONTINUED] furosemide (LASIX) 20 MG tablet Take 3 tablets (60 mg total) by mouth daily.     Allergies:   Clindamycin/lincomycin   Social History   Tobacco Use  . Smoking status: Former Smoker    Quit date: 05/08/1961    Years since quitting: 57.4  . Smokeless tobacco: Never Used   Substance Use Topics  . Alcohol use: No  . Drug use: Never     Family Hx: The patient's family history includes Breast cancer in her daughter, maternal aunt, maternal grandmother, and sister; Cancer in her daughter, maternal aunt, maternal grandmother, and sister; Heart disease in her father and sister.  ROS:   Please see the history of present illness.     All other systems reviewed and are negative.   Prior CV studies:   The following studies were reviewed today:  Echo 02/19/18 EF 50-55, inf HK, mild AI, MAC, severely reduced RVSF, mod RVE, severe RAE, atrial septal aneurysm, PASP 39  Cardiac Catheterization 02/18/18 LAD prox 50  LCx prox 80 RCA prox 99, mid 100 PCI:  3 x 12 mm Synergy DES to the proximal RCA  PCI:  2.5 x 8 mm Synergy DES  to the mid RCA      Labs/Other Tests and Data Reviewed:    EKG:  No ECG reviewed.  Recent Labs: 02/24/2018: Magnesium 1.9 07/08/2018: ALT 30 07/20/2018: BUN 22; Creatinine, Ser 1.06; Hemoglobin 14.2; NT-Pro BNP 2,553; Platelets 172; Potassium 4.3; Sodium 141   Recent Lipid Panel Lab Results  Component Value Date/Time   CHOL 168 02/18/2018 02:05 PM   TRIG 70 02/18/2018 02:05 PM   HDL 62 02/18/2018 02:05 PM   CHOLHDL 2.7 02/18/2018 02:05 PM   LDLCALC 92 02/18/2018 02:05 PM    Wt Readings from Last 3 Encounters:  10/20/18 152 lb (68.9 kg)  07/06/18 152 lb 3.2 oz (69 kg)  05/03/18 150 lb (68 kg)     Objective:    Vital Signs:  BP 128/82   Pulse 77   Ht _0  (1.575 m)   Wt 152 lb (68.9 kg)   BMI 27.80 kg/m    VITAL SIGNS:  reviewed GEN:  no acute distress RESPIRATORY:  Normal respiratory effort NEURO:  Alert and oriented PSYCH:  Normal mood  ASSESSMENT & PLAN:    1. Chronic heart failure with preserved ejection fraction ([place]) She continues to have issues with volume excess.  Overall, since starting Lasix, her symptoms have improved.  I think that she would feel better if we increase her Lasix somewhat more.   Therefore, I will increase her Lasix to 40 mg twice daily.  Arrange repeat BMET in 1 to 2 weeks.  She knows to contact me if she has worsening swelling or shortness of breath.  2. Persistent atrial fibrillation Tolerating anticoagulation.  Continue Apixaban.  3. Coronary artery disease involving native coronary artery of native heart without angina pectoris History of inferior MI in December 2019 treated with DES x2 to the RCA.  She is doing well without anginal symptoms.  Continue clopidogrel, atorvastatin.  4. Essential hypertension The patient's blood pressure is controlled on her current regimen.  Continue current therapy.   5. Educated About Covid-19 Virus Infection The signs and symptoms of COVID-19 were discussed with the patient and how to seek care for testing (follow up with PCP or arrange E-visit).  The importance of social distancing was discussed today.  Time:   Today, I have spent 20 minutes with the patient with telehealth technology discussing the above problems.     Medication Adjustments/Labs and Tests Ordered: Current medicines are reviewed at length with the patient today.  Concerns regarding medicines are outlined above.   Tests Ordered: Orders Placed This Encounter  Procedures  . Basic metabolic panel    Medication Changes: Meds ordered this encounter  Medications  . furosemide (LASIX) 20 MG tablet    Sig: Take 2 tablets (40 mg total) by mouth 2 (two) times daily.    Dispense:  360 tablet    Refill:  3    Dose increase; take doses 6 hours apart    Follow Up:  In Person in 6 month(s)  Signed, [name], PA-C  10/20/2018 4:42 PM    [place]

## 2018-10-20 NOTE — Patient Instructions (Signed)
Medication Instructions:   Your physician has recommended you make the following change in your medication:   1) Increase your Lasix to 40MG (2 tablets) by mouth twice a day.   If you need a refill on your cardiac medications before your next appointment, please call your pharmacy.   Lab work:  Your physician recommends that you return for lab work in: 1 week for a BMET on 10/27/18 at 11:45AM  If you have labs (blood work) drawn today and your tests are completely normal, you will receive your results only by: Marland Kitchen MyChart Message (if you have MyChart) OR . A paper copy in the mail If you have any lab test that is abnormal or we need to change your treatment, we will call you to review the results.  Testing/Procedures:  None ordered today  Follow-Up: At [facility], you and your health needs are our priority.  As part of our continuing mission to provide you with exceptional heart care, we have created designated Provider Care Teams.  These Care Teams include your primary Cardiologist (physician) and Advanced Practice Providers (APPs -  Physician Assistants and Nurse Practitioners) who all work together to provide you with the care you need, when you need it. You will need a follow up appointment in:  6 months.  Please call our office 2 months in advance to schedule this appointment.  You may see Mertie Moores, MD or one of the following Advanced Practice Providers on your designated Care Team: [name], PA-C [place], [place] . Daune Perch, NP

## 2018-10-27 ENCOUNTER — Other Ambulatory Visit: Payer: Self-pay

## 2018-10-27 ENCOUNTER — Other Ambulatory Visit: Payer: Medicare Other | Admitting: *Deleted

## 2018-10-27 ENCOUNTER — Encounter (INDEPENDENT_AMBULATORY_CARE_PROVIDER_SITE_OTHER): Payer: Self-pay

## 2018-10-27 DIAGNOSIS — I2510 Atherosclerotic heart disease of native coronary artery without angina pectoris: Secondary | ICD-10-CM

## 2018-10-27 DIAGNOSIS — I4819 Other persistent atrial fibrillation: Secondary | ICD-10-CM

## 2018-10-27 DIAGNOSIS — I5032 Chronic diastolic (congestive) heart failure: Secondary | ICD-10-CM

## 2018-10-27 DIAGNOSIS — I10 Essential (primary) hypertension: Secondary | ICD-10-CM

## 2018-10-27 LAB — BASIC METABOLIC PANEL
BUN/Creatinine Ratio: 21 (ref 12–28)
BUN: 25 mg/dL (ref 8–27)
CO2: 23 mmol/L (ref 20–29)
Calcium: 9.6 mg/dL (ref 8.7–10.3)
Chloride: 102 mmol/L (ref 96–106)
Creatinine, Ser: 1.17 mg/dL — ABNORMAL HIGH (ref 0.57–1.00)
GFR calc Af Amer: 49 mL/min/{1.73_m2} — ABNORMAL LOW (ref >59)
GFR calc non Af Amer: 43 mL/min/{1.73_m2} — ABNORMAL LOW (ref >59)
Glucose: 99 mg/dL (ref 65–99)
Potassium: 4.7 mmol/L (ref 3.5–5.2)
Sodium: 140 mmol/L (ref 134–144)

## 2018-10-28 ENCOUNTER — Other Ambulatory Visit: Payer: Self-pay | Admitting: *Deleted

## 2018-10-28 DIAGNOSIS — I4819 Other persistent atrial fibrillation: Secondary | ICD-10-CM

## 2018-10-28 DIAGNOSIS — I5032 Chronic diastolic (congestive) heart failure: Secondary | ICD-10-CM

## 2018-11-11 ENCOUNTER — Other Ambulatory Visit: Payer: Medicare Other | Admitting: *Deleted

## 2018-11-11 ENCOUNTER — Other Ambulatory Visit: Payer: Self-pay

## 2018-11-11 DIAGNOSIS — I4819 Other persistent atrial fibrillation: Secondary | ICD-10-CM

## 2018-11-11 DIAGNOSIS — I5032 Chronic diastolic (congestive) heart failure: Secondary | ICD-10-CM

## 2018-11-12 LAB — BASIC METABOLIC PANEL
BUN/Creatinine Ratio: 24 (ref 12–28)
BUN: 27 mg/dL (ref 8–27)
CO2: 25 mmol/L (ref 20–29)
Calcium: 9.5 mg/dL (ref 8.7–10.3)
Chloride: 102 mmol/L (ref 96–106)
Creatinine, Ser: 1.12 mg/dL — ABNORMAL HIGH (ref 0.57–1.00)
GFR calc Af Amer: 52 mL/min/{1.73_m2} — ABNORMAL LOW (ref >59)
GFR calc non Af Amer: 45 mL/min/{1.73_m2} — ABNORMAL LOW (ref >59)
Glucose: 88 mg/dL (ref 65–99)
Potassium: 4.1 mmol/L (ref 3.5–5.2)
Sodium: 141 mmol/L (ref 134–144)

## 2019-01-24 ENCOUNTER — Other Ambulatory Visit: Payer: Self-pay | Admitting: Cardiovascular Disease

## 2019-03-28 ENCOUNTER — Other Ambulatory Visit: Payer: Self-pay | Admitting: Cardiology

## 2019-04-15 ENCOUNTER — Ambulatory Visit: Payer: Medicare Other | Attending: Internal Medicine

## 2019-04-15 DIAGNOSIS — Z23 Encounter for immunization: Secondary | ICD-10-CM | POA: Insufficient documentation

## 2019-04-15 NOTE — Progress Notes (Signed)
   Covid-19 Vaccination Clinic  Name:  Charlene Dickerson    MRN: [id number] DOB: [birth year]  04/15/2019  [name] was observed post Covid-19 immunization for 15 minutes without incidence. She was provided with Vaccine Information Sheet and instruction to access the V-Safe system.   [name] was instructed to call 911 with any severe reactions post vaccine: Marland Kitchen Difficulty breathing  . Swelling of your face and throat  . A fast heartbeat  . A bad rash all over your body  . Dizziness and weakness    Immunizations Administered    Name Date Dose VIS Date Route   Pfizer COVID-19 Vaccine 04/15/2019  3:04 PM 0.3 mL 02/11/2019 Intramuscular   Manufacturer: [employer], [employer]   Lot: [id number]   NDC: [address]-7

## 2019-04-17 ENCOUNTER — Other Ambulatory Visit: Payer: Self-pay | Admitting: Cardiovascular Disease

## 2019-04-24 ENCOUNTER — Encounter: Payer: Self-pay | Admitting: Cardiovascular Disease

## 2019-04-24 NOTE — Progress Notes (Signed)
Cardiology Office Note:    Date:  04/25/2019   ID:  Charlene Dickerson, DOB [birth year], MRN [id number]  PCP:  Marton Redwood, MD  Cardiologist:  Mertie Moores, MD  Electrophysiologist:  None   Referring MD: Marton Redwood, MD   Chief Complaint  Patient presents with  . Coronary Artery Disease     Jan. 10, 2020   Charlene Dickerson is a 86 y.o. female with a recent admission to the hospital with an acute inferior wall myocardial infarction.  Was complicated by right ventricular infarction.  Was seen with Charlene Dickerson, ( daughter)   She initially had lots of problems with hypotension and required Levophed for several days while in hospital.  But has made a great recovery.   Also had paroxysmal atrial fibrillation as a complication of her heart attack.  She converted back to sinus rhythm on December 24    She is now feeling quite a bit better. No syncope or presyncope.  She is not had any episodes of angina.  April 12, 2018: See seen back today for follow-up visit.  She has a history of an inferior wall myocardial infarction complicated by right ventricular myocardial infarction.  She is developed atrial fibrillation. Gaining weight and her blood pressures been elevated.  We started her on furosemide 20 mg a day . Marland Kitchen  Swelling has improved significantly after starting Lasix but she still very uncomfortable. In discussing with her daughter, Charlene Dickerson , it is clear that [name] is not putting out much urine even on the Lasix.  Feb. 22, 2021  [name] was seen a year ago  She has a hx of Inf. MI with RV infarction.  made a nice recovery   She has RV CHF and atrial fib She had a successful cardioversion in Feb. 2020 She had a telemedicine visit with [name], [place] in August, 2020  Seemed to be doing well  She had some volume excess and her lasix was increased to 40 BID  Rare episodes of dyspnea with walking . No cp  Still has some leg swelling    Some leg swelling .   Is on lasix 20 bid,  Has  frequent urination   Has ad 1st covid vaccine.       Past Medical History:  Diagnosis Date  . Diverticulosis of colon   . Essential tremor   . GERD (gastroesophageal reflux disease)    occasional (no meds)  . History of basal cell carcinoma excision 2012  . Hypertension   . Osteoporosis   . PONV (postoperative nausea and vomiting)   . Rectocele    symptomatic    Past Surgical History:  Procedure Laterality Date  . BREAST EXCISIONAL BIOPSY Left 1995  . BREAST EXCISIONAL BIOPSY Right 1988  . CARDIOVERSION N/A 04/16/2018   Procedure: CARDIOVERSION;  Surgeon: Elouise Munroe, MD;  Location: [facility] ENDOSCOPY;  Service: Cardiovascular;  Laterality: N/A;  . CATARACT EXTRACTION W/ INTRAOCULAR LENS  IMPLANT, BILATERAL  right 09/ 2013;  left 10/ 2013  . COLONOSCOPY  last one 04/10/ 2012  . CORONARY STENT INTERVENTION N/A 02/18/2018   Procedure: CORONARY STENT INTERVENTION;  Surgeon: Lorretta Harp, MD;  Location: [place] CV LAB;  Service: Cardiovascular;  Laterality: N/A;  . D & C HYSTEROSCOPY W/ RESECTION ENDOMETRIAL POLYP AND LESION  07-27-2007   dr Ulanda Edison @[facility]   . LEFT HEART CATH AND CORONARY ANGIOGRAPHY N/A 02/18/2018   Procedure: LEFT HEART CATH AND CORONARY ANGIOGRAPHY;  Surgeon: Lorretta Harp, MD;  Location: [facility] INVASIVE CV LAB;  Service: Cardiovascular;  Laterality: N/A;  . RECTOCELE REPAIR N/A 11/26/2017   Procedure: POSTERIOR REPAIR (RECTOCELE);  Surgeon: Tracey Harries, MD;  [facility];  Service: Gynecology;  Laterality: N/A;  OUTPT IN BED  . TEMPORARY PACEMAKER N/A 02/18/2018   Procedure: TEMPORARY PACEMAKER;  Surgeon: Runell Gess, MD;  Location: [facility] INVASIVE CV LAB;  Service: Cardiovascular;  Laterality: N/A;  . TUBAL LIGATION Bilateral yrs ago    Current Medications: Current Meds  Medication Sig  . acetaminophen (TYLENOL) 500 MG tablet Take 1,000 mg by mouth every 6 (six) hours as needed (pain.).   Marland Kitchen ALPRAZolam (XANAX) 0.5 MG  tablet Take 0.25-0.5 tablets by mouth 2 (two) times daily as needed for anxiety.   Marland Kitchen atorvastatin (LIPITOR) 40 MG tablet TAKE 1 TABLET(40 MG) BY MOUTH DAILY AT 6 PM  . calcium citrate (CALCITRATE - DOSED IN MG ELEMENTAL CALCIUM) 950 MG tablet Take 200 mg of elemental calcium by mouth daily.  . clopidogrel (PLAVIX) 75 MG tablet Take 1 tablet (75 mg total) by mouth daily.  Marland Kitchen ELIQUIS 5 MG TABS tablet TAKE 1 TABLET(5 MG) BY MOUTH TWICE DAILY  . famotidine (PEPCID AC) 10 MG tablet Take 10 mg by mouth as needed for heartburn or indigestion.  . fluticasone (FLONASE) 50 MCG/ACT nasal spray Place 1 spray into both nostrils 2 (two) times daily as needed for allergies or rhinitis.   . furosemide (LASIX) 20 MG tablet Take 2 tablets (40 mg total) by mouth 2 (two) times daily.  Marland Kitchen latanoprost (XALATAN) 0.005 % ophthalmic solution Place 1 drop into both eyes at bedtime.   Marland Kitchen loratadine (CLARITIN) 10 MG tablet Take 10 mg by mouth daily.  . Multiple Vitamin (MULTIVITAMIN WITH MINERALS) TABS tablet Take 1 tablet by mouth daily.  . nitroGLYCERIN (NITROSTAT) 0.4 MG SL tablet Place 1 tablet (0.4 mg total) under the tongue every 5 (five) minutes as needed for chest pain.  . pantoprazole (PROTONIX) 40 MG tablet TAKE 1 TABLET(40 MG) BY MOUTH AT BEDTIME  . potassium chloride (KLOR-CON) 10 MEQ tablet TAKE 2 TABLETS BY MOUTH TWICE DAILY  . propranolol (INDERAL) 10 MG tablet Take 10 mg by mouth 2 (two) times daily. (2) 1 times daily   . simethicone (MYLICON) 80 MG chewable tablet Chew 160 mg by mouth every 6 (six) hours as needed for flatulence.  . Vitamin D, Ergocalciferol, (DRISDOL) 1.25 MG (50000 UT) CAPS capsule Take 50,000 Units by mouth every Wednesday.      Allergies:   Clindamycin/lincomycin   Social History   Socioeconomic History  . Marital status: Widowed    Spouse name: Not on file  . Number of children: Not on file  . Years of education: Not on file  . Highest education level: Not on file  Occupational  History  . Not on file  Tobacco Use  . Smoking status: Former Smoker    Quit date: 05/08/1961    Years since quitting: 58.0  . Smokeless tobacco: Never Used  Substance and Sexual Activity  . Alcohol use: No  . Drug use: Never  . Sexual activity: Not on file  Other Topics Concern  . Not on file  Social History Narrative  . Not on file   Social Determinants of Health   Financial Resource Strain:   . Difficulty of Paying Living Expenses: Not on file  Food Insecurity:   . Worried About Programme researcher, broadcasting/film/video in the Last Year: Not on file  .  Ran Out of Food in the Last Year: Not on file  Transportation Needs:   . Lack of Transportation (Medical): Not on file  . Lack of Transportation (Non-Medical): Not on file  Physical Activity:   . Days of Exercise per Week: Not on file  . Minutes of Exercise per Session: Not on file  Stress:   . Feeling of Stress : Not on file  Social Connections:   . Frequency of Communication with Friends and Family: Not on file  . Frequency of Social Gatherings with Friends and Family: Not on file  . Attends Religious Services: Not on file  . Active Member of Clubs or Organizations: Not on file  . Attends Banker Meetings: Not on file  . Marital Status: Not on file     Family History: The patient's family history includes Breast cancer in her daughter, maternal aunt, maternal grandmother, and sister; Cancer in her daughter, maternal aunt, maternal grandmother, and sister; Heart disease in her father and sister.  ROS:   Please see the history of present illness.     All other systems reviewed and are negative.  EKGs/Labs/Other Studies Reviewed:    The following studies were reviewed today:   Recent Labs: 07/08/2018: ALT 30 07/20/2018: Hemoglobin 14.2; NT-Pro BNP 2,553; Platelets 172 11/11/2018: BUN 27; Creatinine, Ser 1.12; Potassium 4.1; Sodium 141  Recent Lipid Panel    Component Value Date/Time   CHOL 168 02/18/2018 1405   TRIG 70  02/18/2018 1405   HDL 62 02/18/2018 1405   CHOLHDL 2.7 02/18/2018 1405   VLDL 14 02/18/2018 1405   LDLCALC 92 02/18/2018 1405    Physical Exam: Blood pressure (!) 148/78, pulse 73, height 5\' 2"  (1.575 m), weight 156 lb 4 oz (70.9 kg), SpO2 96 %.  GEN:  Elderly female,   HEENT: Normal NECK: No JVD; No carotid bruits LYMPHATICS: No lymphadenopathy CARDIAC: RRR , no murmurs, rubs, gallops RESPIRATORY:  Clear to auscultation without rales, wheezing or rhonchi  ABDOMEN: Soft, non-tender, non-distended MUSCULOSKELETAL:  No edema; No deformity  SKIN: Warm and dry NEUROLOGIC:  Alert and oriented x 3  ECG: April 25, 2019: Normal sinus rhythm at 73.  No ST or T wave abnormalities.  ASSESSMENT:    1. Essential hypertension   2. Coronary artery disease involving native coronary artery of native heart without angina pectoris   3. Hyperlipidemia, unspecified hyperlipidemia type   4. Chronic heart failure with preserved ejection fraction (HCC)    PLAN:    In order of problems listed above:  Inferior wall myocardial infarction-  No angina . Contin current meds  check lipids today  2.  Acute congestive heart failure: She does not have any signs or symptoms of congestive heart failure at this time.  Continue current medications.  2.  Paroxysmal atrial fibrillation:    . Medication Adjustments/Labs and Tests Ordered: Current medicines are reviewed at length with the patient today.  Concerns regarding medicines are outlined above.  Orders Placed This Encounter  Procedures  . Lipid Profile  . Basic Metabolic Panel (BMET)  . Hepatic function panel  . EKG 12-Lead   No orders of the defined types were placed in this encounter.   Patient Instructions  Medication Instructions:  Your physician recommends that you continue on your current medications as directed. Please refer to the Current Medication list given to you today.  *If you need a refill on your cardiac medications before  your next appointment, please call your pharmacy*  Lab Work: TODAY - cholesterol, liver panel, basic metabolic panel If you have labs (blood work) drawn today and your tests are completely normal, you will receive your results only by: Marland Kitchen MyChart Message (if you have MyChart) OR . A paper copy in the mail If you have any lab test that is abnormal or we need to change your treatment, we will call you to review the results.   Testing/Procedures: None Ordered   Follow-Up: At [facility], you and your health needs are our priority.  As part of our continuing mission to provide you with exceptional heart care, we have created designated Provider Care Teams.  These Care Teams include your primary Cardiologist (physician) and Advanced Practice Providers (APPs -  Physician Assistants and Nurse Practitioners) who all work together to provide you with the care you need, when you need it.  Your next appointment:   1 year(s)  The format for your next appointment:   In Person  Provider:   You may see Kristeen Miss, MD or one of the following Advanced Practice Providers on your designated Care Team:    Tereso Newcomer, PA-C  [place], [place]  [place], [place]       Signed, Kristeen Miss, MD  04/25/2019 5:27 PM    [facility]

## 2019-04-25 ENCOUNTER — Ambulatory Visit (INDEPENDENT_AMBULATORY_CARE_PROVIDER_SITE_OTHER): Payer: Medicare Other | Admitting: Cardiovascular Disease

## 2019-04-25 ENCOUNTER — Encounter: Payer: Self-pay | Admitting: Cardiovascular Disease

## 2019-04-25 ENCOUNTER — Other Ambulatory Visit: Payer: Self-pay

## 2019-04-25 VITALS — BP 148/78 | HR 73 | Ht 62.0 in | Wt 156.2 lb

## 2019-04-25 DIAGNOSIS — I5032 Chronic diastolic (congestive) heart failure: Secondary | ICD-10-CM

## 2019-04-25 DIAGNOSIS — I2111 ST elevation (STEMI) myocardial infarction involving right coronary artery: Secondary | ICD-10-CM

## 2019-04-25 DIAGNOSIS — I10 Essential (primary) hypertension: Secondary | ICD-10-CM | POA: Diagnosis not present

## 2019-04-25 DIAGNOSIS — I2510 Atherosclerotic heart disease of native coronary artery without angina pectoris: Secondary | ICD-10-CM

## 2019-04-25 DIAGNOSIS — E785 Hyperlipidemia, unspecified: Secondary | ICD-10-CM | POA: Diagnosis not present

## 2019-04-25 NOTE — Patient Instructions (Signed)
Medication Instructions:  Your physician recommends that you continue on your current medications as directed. Please refer to the Current Medication list given to you today.  *If you need a refill on your cardiac medications before your next appointment, please call your pharmacy*  Lab Work: TODAY - cholesterol, liver panel, basic metabolic panel If you have labs (blood work) drawn today and your tests are completely normal, you will receive your results only by: . MyChart Message (if you have MyChart) OR . A paper copy in the mail If you have any lab test that is abnormal or we need to change your treatment, we will call you to review the results.   Testing/Procedures: None Ordered   Follow-Up: At [facility], you and your health needs are our priority.  As part of our continuing mission to provide you with exceptional heart care, we have created designated Provider Care Teams.  These Care Teams include your primary Cardiologist (physician) and Advanced Practice Providers (APPs -  Physician Assistants and Nurse Practitioners) who all work together to provide you with the care you need, when you need it.  Your next appointment:   1 year(s)  The format for your next appointment:   In Person  Provider:   You may see Philip Nahser, MD or one of the following Advanced Practice Providers on your designated Care Team:    Scott Weaver, PA-C  Vin Bhagat, PA-C  Janine Hammond, NP    

## 2019-04-26 ENCOUNTER — Other Ambulatory Visit: Payer: Self-pay | Admitting: Nurse Practitioner

## 2019-04-26 LAB — HEPATIC FUNCTION PANEL
ALT: 18 IU/L (ref 0–32)
AST: 26 IU/L (ref 0–40)
Albumin: 4.2 g/dL (ref 3.6–4.6)
Alkaline Phosphatase: 56 IU/L (ref 39–117)
Bilirubin Total: 0.5 mg/dL (ref 0.0–1.2)
Bilirubin, Direct: 0.17 mg/dL (ref 0.00–0.40)
Total Protein: 7.1 g/dL (ref 6.0–8.5)

## 2019-04-26 LAB — BASIC METABOLIC PANEL
BUN/Creatinine Ratio: 13 (ref 12–28)
BUN: 13 mg/dL (ref 8–27)
CO2: 27 mmol/L (ref 20–29)
Calcium: 9.7 mg/dL (ref 8.7–10.3)
Chloride: 102 mmol/L (ref 96–106)
Creatinine, Ser: 1.04 mg/dL — ABNORMAL HIGH (ref 0.57–1.00)
GFR calc Af Amer: 57 mL/min/{1.73_m2} — ABNORMAL LOW (ref >59)
GFR calc non Af Amer: 49 mL/min/{1.73_m2} — ABNORMAL LOW (ref >59)
Glucose: 80 mg/dL (ref 65–99)
Potassium: 3.9 mmol/L (ref 3.5–5.2)
Sodium: 143 mmol/L (ref 134–144)

## 2019-04-26 LAB — LIPID PANEL
Chol/HDL Ratio: 2.1 ratio (ref 0.0–4.4)
Cholesterol, Total: 184 mg/dL (ref 100–199)
HDL: 86 mg/dL (ref >39)
LDL Chol Calc (NIH): 79 mg/dL (ref 0–99)
Triglycerides: 107 mg/dL (ref 0–149)
VLDL Cholesterol Cal: 19 mg/dL (ref 5–40)

## 2019-04-26 MED ORDER — ATORVASTATIN CALCIUM 40 MG PO TABS: ORAL_TABLET | ORAL | 3 refills | Status: DC

## 2019-05-08 ENCOUNTER — Ambulatory Visit: Payer: Medicare Other | Attending: Internal Medicine

## 2019-05-08 DIAGNOSIS — Z23 Encounter for immunization: Secondary | ICD-10-CM | POA: Insufficient documentation

## 2019-05-08 NOTE — Progress Notes (Signed)
   Covid-19 Vaccination Clinic  Name:  Charlene Dickerson    MRN: [id number] DOB: Feb 02, 1933  05/08/2019  [name] was observed post Covid-19 immunization for 15 minutes without incident. She was provided with Vaccine Information Sheet and instruction to access the V-Safe system.   [name] was instructed to call 911 with any severe reactions post vaccine: Marland Kitchen Difficulty breathing  . Swelling of face and throat  . A fast heartbeat  . A bad rash all over body  . Dizziness and weakness   Immunizations Administered    Name Date Dose VIS Date Route   Pfizer COVID-19 Vaccine 05/08/2019 12:49 PM 0.3 mL 02/11/2019 Intramuscular   Manufacturer: [employer], [employer]   Lot: [id number]   NDC: [address]-8

## 2019-06-01 ENCOUNTER — Other Ambulatory Visit: Payer: Self-pay

## 2019-06-01 ENCOUNTER — Ambulatory Visit (INDEPENDENT_AMBULATORY_CARE_PROVIDER_SITE_OTHER): Payer: Medicare Other | Admitting: Orthopaedic Surgery

## 2019-06-01 ENCOUNTER — Telehealth: Payer: Self-pay | Admitting: Radiology

## 2019-06-01 ENCOUNTER — Ambulatory Visit: Payer: Self-pay

## 2019-06-01 ENCOUNTER — Encounter: Payer: Self-pay | Admitting: Orthopaedic Surgery

## 2019-06-01 VITALS — Ht 62.0 in | Wt 151.0 lb

## 2019-06-01 DIAGNOSIS — M545 Low back pain, unspecified: Secondary | ICD-10-CM | POA: Insufficient documentation

## 2019-06-01 HISTORY — DX: Low back pain, unspecified: M54.50

## 2019-06-01 NOTE — Progress Notes (Signed)
Office Visit Note   Patient: Charlene Dickerson           Date of Birth: 1932/04/05           MRN: [id number] Visit Date: 06/01/2019              Requested by: Martha Clan, MD [address] [place],  [place] [address] PCP: Martha Clan, MD   Assessment & Plan: Visit Diagnoses:  1. Acute left-sided low back pain, unspecified whether sciatica present   2. Acute left-sided low back pain without sciatica     Plan: Acute onset of low back pain approximately 5 days ago after lifting. Pain is localized to her back with some recent referred pain to the lateral aspect of her left hip. No pain further distally. Has taken Tylenol for pain which gives her some relief. Has a strong cardiac history and is on a number of medicines occluding Eliquis and Plavix. Films were negative for any obvious acute changes. I suspect she is probably having pain related to the arthritis. We will try a back support and have her continue with the Tylenol. She is nervous about taking any stronger medicines because of her cardiac history. If no improvement over the next 10 to 14 days had consider an MRI scan to rule out an occult compression fracture  Follow-Up Instructions: Return in about 1 week (around 06/08/2019).   Orders:  Orders Placed This Encounter  Procedures  . XR Lumbar Spine 2-3 Views   No orders of the defined types were placed in this encounter.     Procedures: No procedures performed   Clinical Data: No additional findings.   Subjective: Chief Complaint  Patient presents with  . Lower Back - Pain  Patient presents today for lower back pain. She said that her back started hurting Saturday 05/28/2019 with no injury. She has never had pain in her lower back before. She has pain in her mid lower back and radiates into her lateral left hip. No groin pain. No numbness, tingling, or weakness in her lower extremities. She is taking Advil or Extra Strength Tylenol. Her pain is constant no matter the  position or activity. Has a history of cardiac disease and is on Eliquis and Plavix  HPI  Review of Systems   Objective: Vital Signs: Ht 5\' 2"  (1.575 m)   Wt 151 lb (68.5 kg)   BMI 27.62 kg/m   Physical Exam Constitutional:      Appearance: She is well-developed.  Eyes:     Pupils: Pupils are equal, round, and reactive to light.  Pulmonary:     Effort: Pulmonary effort is normal.  Skin:    General: Skin is warm and dry.  Neurological:     Mental Status: She is alert and oriented to person, place, and time.  Psychiatric:        Behavior: Behavior normal.     Ortho Exam awake alert and oriented x3 and comfortable sitting. No percussible tenderness of the thoracic or lumbar spine. Straight leg raise was minimally positive on the left for back pain at about 90 degrees and negative on the right. Motor exam appears to be intact. Sensory exam intact to both lower extremities. No pain over the lateral aspect of her left hip  Specialty Comments:  No specialty comments available.  Imaging: XR Lumbar Spine 2-3 Views  Result Date: 06/01/2019 His lumbar spine obtained in several projections. There is a degenerative scoliosis of about 10 degrees to the  right. There are degenerative changes throughout the lumbar spine and narrowing of the L5-S1 disc space. May be slight anterior listhesis of L5 on S1. Diffuse calcification of the abdominal aorta but without obvious aneurysmal dilatation. No obvious compression fracture    PMFS History: Patient Active Problem List   Diagnosis Date Noted  . Low back pain 06/01/2019  . Paroxysmal atrial fibrillation (HCC)   . Persistent atrial fibrillation ([place]) 03/12/2018  . Shock circulatory ([place])   . Hemodynamic instability   . Central venous catheter in place   . STEMI (ST elevation myocardial infarction) ([place]) 02/18/2018  . Acute myocardial infarction ([place]) 02/18/2018  . Complete heart block ([place])   . Rectocele 11/26/2017  . Cystocele with  rectocele 11/26/2017  . Diffuse cystic mastopathy of breast 05/09/2011   Past Medical History:  Diagnosis Date  . Diverticulosis of colon   . Essential tremor   . GERD (gastroesophageal reflux disease)    occasional (no meds)  . History of basal cell carcinoma excision 2012  . Hypertension   . Osteoporosis   . PONV (postoperative nausea and vomiting)   . Rectocele    symptomatic    Family History  Problem Relation Age of Onset  . Cancer Sister        Breast  . Heart disease Sister   . Breast cancer Sister   . Heart disease Father   . Cancer Daughter        breast  . Breast cancer Daughter   . Cancer Maternal Aunt        breast  . Breast cancer Maternal Aunt   . Cancer Maternal Grandmother        breast  . Breast cancer Maternal Grandmother     Past Surgical History:  Procedure Laterality Date  . BREAST EXCISIONAL BIOPSY Left 1995  . BREAST EXCISIONAL BIOPSY Right 1988  . CARDIOVERSION N/A 04/16/2018   Procedure: CARDIOVERSION;  Surgeon: Elouise Munroe, MD;  Location: [facility] ENDOSCOPY;  Service: Cardiovascular;  Laterality: N/A;  . CATARACT EXTRACTION W/ INTRAOCULAR LENS  IMPLANT, BILATERAL  right 09/ 2013;  left 10/ 2013  . COLONOSCOPY  last one 04/10/ 2012  . CORONARY STENT INTERVENTION N/A 02/18/2018   Procedure: CORONARY STENT INTERVENTION;  Surgeon: Lorretta Harp, MD;  Location: [place] [facility];  Service: Cardiovascular;  Laterality: N/A;  . D & C HYSTEROSCOPY W/ RESECTION ENDOMETRIAL POLYP AND LESION  07-27-2007   dr Ulanda Edison @[facility]   . LEFT HEART CATH AND CORONARY ANGIOGRAPHY N/A 02/18/2018   Procedure: LEFT HEART CATH AND CORONARY ANGIOGRAPHY;  Surgeon: Lorretta Harp, MD;  Location: [place] [facility];  Service: Cardiovascular;  Laterality: N/A;  . RECTOCELE REPAIR N/A 11/26/2017   Procedure: POSTERIOR REPAIR (RECTOCELE);  Surgeon: Newton Pigg, MD;  Location: [facility];  Service: Gynecology;  Laterality: N/A;  OUTPT IN BED  . TEMPORARY  PACEMAKER N/A 02/18/2018   Procedure: TEMPORARY PACEMAKER;  Surgeon: Lorretta Harp, MD;  Location: [place] [facility];  Service: Cardiovascular;  Laterality: N/A;  . TUBAL LIGATION Bilateral yrs ago   Social History   Occupational History  . Not on file  Tobacco Use  . Smoking status: Former Smoker    Quit date: 05/08/1961    Years since quitting: 58.1  . Smokeless tobacco: Never Used  Substance and Sexual Activity  . Alcohol use: No  . Drug use: Never  . Sexual activity: Not on file

## 2019-06-01 NOTE — Telephone Encounter (Signed)
Patient is scheduled to see Dr. Cleophas Dunker tomorrow at 10:15am for lower back pain. She called in today for advise on what she can take or do for pain until tomorrow.

## 2019-06-01 NOTE — Telephone Encounter (Signed)
Spoke with patient and moved her appointment to today since someone had cancelled.

## 2019-06-02 ENCOUNTER — Ambulatory Visit: Payer: Medicare Other | Admitting: Orthopaedic Surgery

## 2019-06-06 ENCOUNTER — Telehealth: Payer: Self-pay | Admitting: Orthopaedic Surgery

## 2019-06-06 NOTE — Telephone Encounter (Signed)
I talked with patient she states that her back has not gotten better and actually it is much worse today. She said she is not having any radicular pain down in her legs but is having groin pain on both sides. Very painful to walk. No numbness or tingling in either leg. Back pain bilaterally and radiates around. She has tried heat without much relief. Tylenol and Advil has not helped much either. Patient would like to know what else could be done. Please advise. Thanks.

## 2019-06-06 NOTE — Telephone Encounter (Signed)
MRI lumbo sacral spine

## 2019-06-06 NOTE — Telephone Encounter (Signed)
Patient called. She would like Lauren to know that her back is not better. Her call back number is [phone]

## 2019-06-07 ENCOUNTER — Other Ambulatory Visit: Payer: Self-pay

## 2019-06-07 ENCOUNTER — Telehealth: Payer: Self-pay | Admitting: Orthopaedic Surgery

## 2019-06-07 DIAGNOSIS — M545 Low back pain, unspecified: Secondary | ICD-10-CM

## 2019-06-07 NOTE — Telephone Encounter (Signed)
Patient called.   She did not disclose why but she is requesting a call back from [employer].   Call back: [phone]

## 2019-06-07 NOTE — Telephone Encounter (Signed)
Spoke with patient. She is very happy to get an MRI of her lower back. She is aware that it may take a few days before she hears from anyone to get scheduled.

## 2019-06-09 ENCOUNTER — Other Ambulatory Visit: Payer: Self-pay

## 2019-06-09 ENCOUNTER — Telehealth: Payer: Self-pay

## 2019-06-09 MED ORDER — CLOPIDOGREL BISULFATE 75 MG PO TABS: 75.0000 mg | ORAL_TABLET | Freq: Every day | ORAL | 3 refills | Status: DC

## 2019-06-09 NOTE — Telephone Encounter (Signed)
Pt's daughter called wanting to know they havent been called about MRI. Per Martie Lee they need to contact [facility]. I informed daughter and gave her the number to call

## 2019-06-12 ENCOUNTER — Other Ambulatory Visit: Payer: Self-pay

## 2019-06-12 ENCOUNTER — Ambulatory Visit (INDEPENDENT_AMBULATORY_CARE_PROVIDER_SITE_OTHER): Payer: Medicare Other

## 2019-06-12 DIAGNOSIS — M545 Low back pain, unspecified: Secondary | ICD-10-CM

## 2019-06-13 ENCOUNTER — Other Ambulatory Visit: Payer: Self-pay

## 2019-06-13 MED ORDER — APIXABAN 5 MG PO TABS: ORAL_TABLET | ORAL | 1 refills | Status: DC

## 2019-06-13 NOTE — Telephone Encounter (Signed)
Pt last saw Dr Elease Hashimoto 04/25/19, last labs 04/25/19 Creat 1.04, age 86, weight 68.5kg, based on specified criteria pt is on appropriate dosage of Eliquis 5mg  BID.  Will refill rx.

## 2019-06-15 ENCOUNTER — Other Ambulatory Visit: Payer: Self-pay

## 2019-06-15 ENCOUNTER — Encounter: Payer: Self-pay | Admitting: Orthopaedic Surgery

## 2019-06-15 ENCOUNTER — Ambulatory Visit (INDEPENDENT_AMBULATORY_CARE_PROVIDER_SITE_OTHER): Payer: Medicare Other | Admitting: Orthopaedic Surgery

## 2019-06-15 VITALS — Ht 62.0 in | Wt 151.0 lb

## 2019-06-15 DIAGNOSIS — M545 Low back pain, unspecified: Secondary | ICD-10-CM

## 2019-06-15 NOTE — Progress Notes (Signed)
Office Visit Note   Patient: Charlene Dickerson           Date of Birth: 13-Mar-1932           MRN: [id number] Visit Date: 06/15/2019              Requested by: Marton Redwood, MD [address] [place],  [place] [address] PCP: Marton Redwood, MD   Assessment & Plan: Visit Diagnoses:  1. Acute left-sided low back pain without sciatica     Plan: MRI scan was reviewed with Mrs. Wehrli and her daughter.  There are degenerative changes throughout the lumbar spine and some multilevel marrow edema that is a potential source of pain.  There could be a possible L4 compression fracture but body height is normal.  There is no evidence of a high-grade canal or foraminal stenosis.  Long discussion regarding all the above.  I think it is worth trying some physical therapy.  Mrs. Deignan lives by herself and will ask for an in-home assessment.  I will also asked Dr. Ernestina Patches if he would consider an injection of either the facet joints or an epidural steroid injection.  I had like to see her back in about 6 weeks  Follow-Up Instructions: Return in about 6 weeks (around 07/27/2019).   Orders:  Orders Placed This Encounter  Procedures  . Ambulatory referral to Physical Medicine Rehab  . Ambulatory referral to Physical Therapy   No orders of the defined types were placed in this encounter.     Procedures: No procedures performed   Clinical Data: No additional findings.   Subjective: Chief Complaint  Patient presents with  . Lower Back - Follow-up    MRI results  Patient presents today for follow up on her lower back. She had an MRI of her L-Spine on 06/12/2019, and is here today to discuss those results.  Still having predominately low back pain  HPI  Review of Systems   Objective: Vital Signs: Ht 5\' 2"  (1.575 m)   Wt 151 lb (68.5 kg)   BMI 27.62 kg/m   Physical Exam Constitutional:      Appearance: She is well-developed.  Eyes:     Pupils: Pupils are equal, round, and reactive to  light.  Pulmonary:     Effort: Pulmonary effort is normal.  Skin:    General: Skin is warm and dry.  Neurological:     Mental Status: She is alert and oriented to person, place, and time.  Psychiatric:        Behavior: Behavior normal.     Ortho Exam awake alert and oriented x3.  Comfortable sitting.  Painless range of motion of both hips with internal and external rotation.  Hamstrings seem a little bit tight.  Motor exam was intact.  Ambulates with a little bit of flexion across her hip.  Very minimal percussible tenderness of the lumbar spine.  Specialty Comments:  No specialty comments available.  Imaging: No results found.   PMFS History: Patient Active Problem List   Diagnosis Date Noted  . Low back pain 06/01/2019  . Paroxysmal atrial fibrillation (HCC)   . Persistent atrial fibrillation ([place]) 03/12/2018  . Shock circulatory ([place])   . Hemodynamic instability   . Central venous catheter in place   . STEMI (ST elevation myocardial infarction) ([place]) 02/18/2018  . Acute myocardial infarction ([place]) 02/18/2018  . Complete heart block ([place])   . Rectocele 11/26/2017  . Cystocele with rectocele 11/26/2017  . 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 Diverticulosis of colon   . Essential tremor   . GERD (gastroesophageal reflux disease)    occasional (no meds)  . History of basal cell carcinoma excision 2012  . Hypertension   . Osteoporosis   . PONV (postoperative nausea and vomiting)   . Rectocele    symptomatic    Family History  Problem Relation Age of Onset  . Cancer Sister        Breast  . Heart disease Sister   . Breast cancer Sister   . Heart disease Father   . Cancer Daughter        breast  . Breast cancer Daughter   . Cancer Maternal Aunt        breast  . Breast cancer Maternal Aunt   . Cancer Maternal Grandmother        breast  . Breast cancer Maternal Grandmother     Past Surgical History:    Procedure Laterality Date  . BREAST EXCISIONAL BIOPSY Left 1995  . BREAST EXCISIONAL BIOPSY Right 1988  . CARDIOVERSION N/A 04/16/2018   Procedure: CARDIOVERSION;  Surgeon: Parke Poisson, MD;  Location: [facility] ENDOSCOPY;  Service: Cardiovascular;  Laterality: N/A;  . CATARACT EXTRACTION W/ INTRAOCULAR LENS  IMPLANT, BILATERAL  right 09/ 2013;  left 10/ 2013  . COLONOSCOPY  last one 04/10/ 2012  . CORONARY STENT INTERVENTION N/A 02/18/2018   Procedure: CORONARY STENT INTERVENTION;  Surgeon: Runell Gess, MD;  Location: [facility] INVASIVE [facility];  Service: Cardiovascular;  Laterality: N/A;  . D & C HYSTEROSCOPY W/ RESECTION ENDOMETRIAL POLYP AND LESION  07-27-2007   dr Ambrose Mantle @[facility]   . LEFT HEART CATH AND CORONARY ANGIOGRAPHY N/A 02/18/2018   Procedure: LEFT HEART CATH AND CORONARY ANGIOGRAPHY;  Surgeon: 02/20/2018, MD;  Location: [facility];  Service: Cardiovascular;  Laterality: N/A;  . RECTOCELE REPAIR N/A 11/26/2017   Procedure: POSTERIOR REPAIR (RECTOCELE);  Surgeon: 11/28/2017, MD;  Location: [facility];  Service: Gynecology;  Laterality: N/A;  OUTPT IN BED  . TEMPORARY PACEMAKER N/A 02/18/2018   Procedure: TEMPORARY PACEMAKER;  Surgeon: 02/20/2018, MD;  Location: [facility] INVASIVE CV LAB;  Service: Cardiovascular;  Laterality: N/A;  . TUBAL LIGATION Bilateral yrs ago   Social History   Occupational History  . Not on file  Tobacco Use  . Smoking status: Former Smoker    Quit date: 05/08/1961    Years since quitting: 58.1  . Smokeless tobacco: Never Used  Substance and Sexual Activity  . Alcohol use: No  . Drug use: Never  . Sexual activity: Not on file

## 2019-06-19 ENCOUNTER — Inpatient Hospital Stay (HOSPITAL_COMMUNITY)
Admission: EM | Admit: 2019-06-19 | Discharge: 2019-06-24 | DRG: 641 | Disposition: A | Discharge status: Skilled Nursing Facility | Payer: Medicare Other | Attending: Internal Medicine | Admitting: Internal Medicine

## 2019-06-19 ENCOUNTER — Emergency Department (HOSPITAL_COMMUNITY): Payer: Medicare Other

## 2019-06-19 ENCOUNTER — Encounter (HOSPITAL_COMMUNITY): Payer: Self-pay

## 2019-06-19 ENCOUNTER — Other Ambulatory Visit: Payer: Self-pay

## 2019-06-19 DIAGNOSIS — Z95818 Presence of other cardiac implants and grafts: Secondary | ICD-10-CM

## 2019-06-19 DIAGNOSIS — I130 Hypertensive heart and chronic kidney disease with heart failure and stage 1 through stage 4 chronic kidney disease, or unspecified chronic kidney disease: Secondary | ICD-10-CM | POA: Diagnosis present

## 2019-06-19 DIAGNOSIS — M549 Dorsalgia, unspecified: Secondary | ICD-10-CM

## 2019-06-19 DIAGNOSIS — M810 Age-related osteoporosis without current pathological fracture: Secondary | ICD-10-CM | POA: Diagnosis present

## 2019-06-19 DIAGNOSIS — Z87891 Personal history of nicotine dependence: Secondary | ICD-10-CM

## 2019-06-19 DIAGNOSIS — I472 Ventricular tachycardia: Secondary | ICD-10-CM | POA: Diagnosis present

## 2019-06-19 DIAGNOSIS — E875 Hyperkalemia: Principal | ICD-10-CM | POA: Diagnosis present

## 2019-06-19 DIAGNOSIS — N3000 Acute cystitis without hematuria: Secondary | ICD-10-CM

## 2019-06-19 DIAGNOSIS — E860 Dehydration: Secondary | ICD-10-CM | POA: Diagnosis present

## 2019-06-19 DIAGNOSIS — M545 Low back pain, unspecified: Secondary | ICD-10-CM

## 2019-06-19 DIAGNOSIS — Z961 Presence of intraocular lens: Secondary | ICD-10-CM | POA: Diagnosis present

## 2019-06-19 DIAGNOSIS — I442 Atrioventricular block, complete: Secondary | ICD-10-CM | POA: Diagnosis present

## 2019-06-19 DIAGNOSIS — E440 Moderate protein-calorie malnutrition: Secondary | ICD-10-CM | POA: Insufficient documentation

## 2019-06-19 DIAGNOSIS — T39395A Adverse effect of other nonsteroidal anti-inflammatory drugs [NSAID], initial encounter: Secondary | ICD-10-CM | POA: Diagnosis present

## 2019-06-19 DIAGNOSIS — E785 Hyperlipidemia, unspecified: Secondary | ICD-10-CM | POA: Diagnosis present

## 2019-06-19 DIAGNOSIS — Z7902 Long term (current) use of antithrombotics/antiplatelets: Secondary | ICD-10-CM

## 2019-06-19 DIAGNOSIS — K5730 Diverticulosis of large intestine without perforation or abscess without bleeding: Secondary | ICD-10-CM | POA: Diagnosis present

## 2019-06-19 DIAGNOSIS — E872 Acidosis, unspecified: Secondary | ICD-10-CM | POA: Diagnosis present

## 2019-06-19 DIAGNOSIS — N179 Acute kidney failure, unspecified: Secondary | ICD-10-CM | POA: Diagnosis present

## 2019-06-19 DIAGNOSIS — M5126 Other intervertebral disc displacement, lumbar region: Secondary | ICD-10-CM | POA: Diagnosis present

## 2019-06-19 DIAGNOSIS — M25552 Pain in left hip: Secondary | ICD-10-CM | POA: Diagnosis not present

## 2019-06-19 DIAGNOSIS — Z7901 Long term (current) use of anticoagulants: Secondary | ICD-10-CM

## 2019-06-19 DIAGNOSIS — Z79899 Other long term (current) drug therapy: Secondary | ICD-10-CM

## 2019-06-19 DIAGNOSIS — Z20822 Contact with and (suspected) exposure to covid-19: Secondary | ICD-10-CM | POA: Diagnosis present

## 2019-06-19 DIAGNOSIS — I2510 Atherosclerotic heart disease of native coronary artery without angina pectoris: Secondary | ICD-10-CM | POA: Diagnosis present

## 2019-06-19 DIAGNOSIS — I480 Paroxysmal atrial fibrillation: Secondary | ICD-10-CM | POA: Diagnosis present

## 2019-06-19 DIAGNOSIS — Z9842 Cataract extraction status, left eye: Secondary | ICD-10-CM

## 2019-06-19 DIAGNOSIS — R54 Age-related physical debility: Secondary | ICD-10-CM | POA: Diagnosis present

## 2019-06-19 DIAGNOSIS — Z85828 Personal history of other malignant neoplasm of skin: Secondary | ICD-10-CM

## 2019-06-19 DIAGNOSIS — N390 Urinary tract infection, site not specified: Secondary | ICD-10-CM | POA: Diagnosis present

## 2019-06-19 DIAGNOSIS — N816 Rectocele: Secondary | ICD-10-CM | POA: Diagnosis present

## 2019-06-19 DIAGNOSIS — Z9841 Cataract extraction status, right eye: Secondary | ICD-10-CM

## 2019-06-19 DIAGNOSIS — K219 Gastro-esophageal reflux disease without esophagitis: Secondary | ICD-10-CM | POA: Diagnosis present

## 2019-06-19 DIAGNOSIS — G250 Essential tremor: Secondary | ICD-10-CM | POA: Diagnosis present

## 2019-06-19 DIAGNOSIS — B961 Klebsiella pneumoniae [K. pneumoniae] as the cause of diseases classified elsewhere: Secondary | ICD-10-CM | POA: Diagnosis present

## 2019-06-19 DIAGNOSIS — R5383 Other fatigue: Secondary | ICD-10-CM | POA: Diagnosis not present

## 2019-06-19 DIAGNOSIS — N1831 Chronic kidney disease, stage 3a: Secondary | ICD-10-CM | POA: Diagnosis present

## 2019-06-19 DIAGNOSIS — R0902 Hypoxemia: Secondary | ICD-10-CM | POA: Diagnosis present

## 2019-06-19 DIAGNOSIS — I700 Atherosclerosis of aorta: Secondary | ICD-10-CM | POA: Diagnosis present

## 2019-06-19 DIAGNOSIS — Z8249 Family history of ischemic heart disease and other diseases of the circulatory system: Secondary | ICD-10-CM

## 2019-06-19 DIAGNOSIS — I10 Essential (primary) hypertension: Secondary | ICD-10-CM

## 2019-06-19 DIAGNOSIS — M5127 Other intervertebral disc displacement, lumbosacral region: Secondary | ICD-10-CM | POA: Diagnosis present

## 2019-06-19 DIAGNOSIS — Z803 Family history of malignant neoplasm of breast: Secondary | ICD-10-CM

## 2019-06-19 DIAGNOSIS — Z791 Long term (current) use of non-steroidal anti-inflammatories (NSAID): Secondary | ICD-10-CM

## 2019-06-19 DIAGNOSIS — Z881 Allergy status to other antibiotic agents status: Secondary | ICD-10-CM

## 2019-06-19 DIAGNOSIS — M4856XA Collapsed vertebra, not elsewhere classified, lumbar region, initial encounter for fracture: Secondary | ICD-10-CM | POA: Diagnosis present

## 2019-06-19 DIAGNOSIS — I5032 Chronic diastolic (congestive) heart failure: Secondary | ICD-10-CM | POA: Diagnosis present

## 2019-06-19 DIAGNOSIS — R45851 Suicidal ideations: Secondary | ICD-10-CM | POA: Diagnosis present

## 2019-06-19 DIAGNOSIS — I493 Ventricular premature depolarization: Secondary | ICD-10-CM | POA: Diagnosis present

## 2019-06-19 DIAGNOSIS — I252 Old myocardial infarction: Secondary | ICD-10-CM

## 2019-06-19 DIAGNOSIS — S32040A Wedge compression fracture of fourth lumbar vertebra, initial encounter for closed fracture: Secondary | ICD-10-CM | POA: Diagnosis present

## 2019-06-19 DIAGNOSIS — N19 Unspecified kidney failure: Secondary | ICD-10-CM

## 2019-06-19 LAB — URINALYSIS, ROUTINE W REFLEX MICROSCOPIC
Bilirubin Urine: NEGATIVE
Glucose, UA: NEGATIVE mg/dL
Ketones, ur: NEGATIVE mg/dL
Nitrite: NEGATIVE
Protein, ur: 100 mg/dL — AB
Specific Gravity, Urine: 1.011 (ref 1.005–1.030)
WBC, UA: >50 WBC/hpf — ABNORMAL HIGH (ref 0–5)
pH: 5 (ref 5.0–8.0)

## 2019-06-19 LAB — CBC WITH DIFFERENTIAL/PLATELET
Abs Immature Granulocytes: 0.05 10*3/uL (ref 0.00–0.07)
Basophils Absolute: 0 10*3/uL (ref 0.0–0.1)
Basophils Relative: 0 %
Eosinophils Absolute: 0 10*3/uL (ref 0.0–0.5)
Eosinophils Relative: 0 %
HCT: 46.9 % — ABNORMAL HIGH (ref 36.0–46.0)
Hemoglobin: 15.3 g/dL — ABNORMAL HIGH (ref 12.0–15.0)
Immature Granulocytes: 0 %
Lymphocytes Relative: 8 %
Lymphs Abs: 0.9 10*3/uL (ref 0.7–4.0)
MCH: 33.5 pg (ref 26.0–34.0)
MCHC: 32.6 g/dL (ref 30.0–36.0)
MCV: 102.6 fL — ABNORMAL HIGH (ref 80.0–100.0)
Monocytes Absolute: 1.2 10*3/uL — ABNORMAL HIGH (ref 0.1–1.0)
Monocytes Relative: 11 %
Neutro Abs: 9.1 10*3/uL — ABNORMAL HIGH (ref 1.7–7.7)
Neutrophils Relative %: 81 %
Platelets: 269 10*3/uL (ref 150–400)
RBC: 4.57 MIL/uL (ref 3.87–5.11)
RDW: 13.1 % (ref 11.5–15.5)
WBC: 11.2 10*3/uL — ABNORMAL HIGH (ref 4.0–10.5)
nRBC: 0 % (ref 0.0–0.2)

## 2019-06-19 LAB — COMPREHENSIVE METABOLIC PANEL
ALT: 20 U/L (ref 0–44)
AST: 21 U/L (ref 15–41)
Albumin: 4.4 g/dL (ref 3.5–5.0)
Alkaline Phosphatase: 47 U/L (ref 38–126)
Anion gap: 13 (ref 5–15)
BUN: 166 mg/dL — ABNORMAL HIGH (ref 8–23)
CO2: 12 mmol/L — ABNORMAL LOW (ref 22–32)
Calcium: 9.5 mg/dL (ref 8.9–10.3)
Chloride: 108 mmol/L (ref 98–111)
Creatinine, Ser: 5.85 mg/dL — ABNORMAL HIGH (ref 0.44–1.00)
GFR calc Af Amer: 7 mL/min — ABNORMAL LOW (ref >60)
GFR calc non Af Amer: 6 mL/min — ABNORMAL LOW (ref >60)
Glucose, Bld: 95 mg/dL (ref 70–99)
Potassium: 7.5 mmol/L (ref 3.5–5.1)
Sodium: 133 mmol/L — ABNORMAL LOW (ref 135–145)
Total Bilirubin: 0.6 mg/dL (ref 0.3–1.2)
Total Protein: 7.6 g/dL (ref 6.5–8.1)

## 2019-06-19 LAB — LIPASE, BLOOD: Lipase: 105 U/L — ABNORMAL HIGH (ref 11–51)

## 2019-06-19 MED ORDER — SODIUM CHLORIDE 0.9 % IV SOLN
1.0000 g | Freq: Once | INTRAVENOUS | Status: AC
  Administered 2019-06-20: 1 g via INTRAVENOUS
  Filled 2019-06-19: qty 10

## 2019-06-19 MED ORDER — HYDROCODONE-ACETAMINOPHEN 5-325 MG PO TABS
1.0000 | ORAL_TABLET | ORAL | Status: AC
  Administered 2019-06-19: 1 via ORAL
  Filled 2019-06-19: qty 1

## 2019-06-19 MED ORDER — DEXTROSE 10 % IV SOLN: Freq: Once | INTRAVENOUS | Status: AC

## 2019-06-19 MED ORDER — CALCIUM GLUCONATE-NACL 1-0.675 GM/50ML-% IV SOLN
1.0000 g | Freq: Once | INTRAVENOUS | Status: AC
  Administered 2019-06-20: 1000 mg via INTRAVENOUS
  Filled 2019-06-19: qty 50

## 2019-06-19 MED ORDER — INSULIN ASPART 100 UNIT/ML IV SOLN
5.0000 [IU] | Freq: Once | INTRAVENOUS | Status: AC
  Administered 2019-06-20: 5 [IU] via INTRAVENOUS
  Filled 2019-06-19: qty 0.05

## 2019-06-19 MED ORDER — DEXTROSE 50 % IV SOLN
1.0000 | Freq: Once | INTRAVENOUS | Status: AC
  Administered 2019-06-20: 50 mL via INTRAVENOUS
  Filled 2019-06-19: qty 50

## 2019-06-19 MED ORDER — SODIUM BICARBONATE 8.4 % IV SOLN
50.0000 meq | Freq: Once | INTRAVENOUS | Status: AC
  Administered 2019-06-20: 50 meq via INTRAVENOUS
  Filled 2019-06-19: qty 50

## 2019-06-19 MED ORDER — SODIUM CHLORIDE 0.9 % IV BOLUS (SEPSIS)
500.0000 mL | Freq: Once | INTRAVENOUS | Status: AC
  Administered 2019-06-19: 500 mL via INTRAVENOUS

## 2019-06-19 NOTE — ED Notes (Signed)
Date and time results received: 06/19/19 2333 (use smartphrase ".now" to insert current time)  Test: potassium  Critical Value: 7.5  Name of Provider Notified: Lynelle Doctor, MD   Orders Received? Or Actions Taken?: Orders Received - See Orders for details   Result given by Mitz[facility]

## 2019-06-19 NOTE — ED Triage Notes (Signed)
Received pt via EMS coming from home with c/o of generalized weakness started sometime last night , a/o x4 , with lower back pain , chronic at 5/5 . Denied fever but claimed  to be nauseated whole  day today. Denied diarrhea. V/S 146/92, HR110,resp 20, 97% at RA.  cbg is 140.

## 2019-06-19 NOTE — ED Provider Notes (Addendum)
[facility]-EMERGENCY DEPT Provider Note   CSN: [id number] Arrival date & time: 06/19/19  1919     History Chief Complaint  Patient presents with  . Fatigue    Charlene Dickerson is a 86 y.o. female.  HPI      Patient presents to the ED for evaluation of fatigue.  Patient states she started to have the symptoms about a week ago.  It has been progressing over the last week.  She has had some generalized nausea as well as overall fatigue and weakness.  She denies any focal weakness.  She denies any headache.  She is not having chest pain or shortness of breath.  No abdominal pain.  No dysuria.  She had one episode of diarrhea this past week but has not noticed frequent diarrhea and has not noticed any blood in her stool  Past Medical History:  Diagnosis Date  . Diverticulosis of colon   . Essential tremor   . GERD (gastroesophageal reflux disease)    occasional (no meds)  . History of basal cell carcinoma excision 2012  . Hypertension   . Osteoporosis   . PONV (postoperative nausea and vomiting)   . Rectocele    symptomatic    Patient Active Problem List   Diagnosis Date Noted  . Low back pain 06/01/2019  . Paroxysmal atrial fibrillation (HCC)   . Persistent atrial fibrillation (HCC) 03/12/2018  . Shock circulatory (HCC)   . Hemodynamic instability   . Central venous catheter in place   . STEMI (ST elevation myocardial infarction) (HCC) 02/18/2018  . Acute myocardial infarction (HCC) 02/18/2018  . Complete heart block (HCC)   . Rectocele 11/26/2017  . Cystocele with rectocele 11/26/2017  . Diffuse cystic mastopathy of breast 05/09/2011    Past Surgical History:  Procedure Laterality Date  . BREAST EXCISIONAL BIOPSY Left 1995  . BREAST EXCISIONAL BIOPSY Right 1988  . CARDIOVERSION N/A 04/16/2018   Procedure: CARDIOVERSION;  Surgeon: Parke Poisson, MD;  Location: [facility] ENDOSCOPY;  Service: Cardiovascular;  Laterality: N/A;  . CATARACT EXTRACTION W/  INTRAOCULAR LENS  IMPLANT, BILATERAL  right 09/ 2013;  left 10/ 2013  . COLONOSCOPY  last one 04/10/ 2012  . CORONARY STENT INTERVENTION N/A 02/18/2018   Procedure: CORONARY STENT INTERVENTION;  Surgeon: Runell Gess, MD;  Location: [facility] INVASIVE CV LAB;  Service: Cardiovascular;  Laterality: N/A;  . D & C HYSTEROSCOPY W/ RESECTION ENDOMETRIAL POLYP AND LESION  07-27-2007   dr Ambrose Mantle @[facility]   . LEFT HEART CATH AND CORONARY ANGIOGRAPHY N/A 02/18/2018   Procedure: LEFT HEART CATH AND CORONARY ANGIOGRAPHY;  Surgeon: Runell Gess, MD;  Location: [facility] INVASIVE CV LAB;  Service: Cardiovascular;  Laterality: N/A;  . RECTOCELE REPAIR N/A 11/26/2017   Procedure: POSTERIOR REPAIR (RECTOCELE);  Surgeon: Tracey Harries, MD;  Location: [facility];  Service: Gynecology;  Laterality: N/A;  OUTPT IN BED  . TEMPORARY PACEMAKER N/A 02/18/2018   Procedure: TEMPORARY PACEMAKER;  Surgeon: Runell Gess, MD;  Location: [facility] INVASIVE CV LAB;  Service: Cardiovascular;  Laterality: N/A;  . TUBAL LIGATION Bilateral yrs ago     OB History   No obstetric history on file.     Family History  Problem Relation Age of Onset  . Cancer Sister        Breast  . Heart disease Sister   . Breast cancer Sister   . Heart disease Father   . Cancer Daughter  breast  . Breast cancer Daughter   . Cancer Maternal Aunt        breast  . Breast cancer Maternal Aunt   . Cancer Maternal Grandmother        breast  . Breast cancer Maternal Grandmother     Social History   Tobacco Use  . Smoking status: Former Smoker    Quit date: 05/08/1961    Years since quitting: 58.1  . Smokeless tobacco: Never Used  Substance Use Topics  . Alcohol use: No  . Drug use: Never    Home Medications Prior to Admission medications   Medication Sig Start Date End Date Taking? Authorizing Provider  acetaminophen (TYLENOL) 500 MG tablet Take 1,000 mg by mouth every 6 (six) hours as needed (pain.).     [provider]  ALPRAZolam Duanne Moron) 0.5 MG tablet Take 0.25-0.5 tablets by mouth 2 (two) times daily as needed for anxiety.  03/05/18   [provider]  apixaban (ELIQUIS) 5 MG TABS tablet TAKE 1 TABLET(5 MG) BY MOUTH TWICE DAILY 06/13/19   Nahser, Wonda Cheng, MD  atorvastatin (LIPITOR) 40 MG tablet TAKE 1 TABLET(40 MG) BY MOUTH DAILY AT 6 PM 04/26/19   Nahser, Wonda Cheng, MD  calcium citrate (CALCITRATE - DOSED IN MG ELEMENTAL CALCIUM) 950 MG tablet Take 200 mg of elemental calcium by mouth daily.    [provider]  clopidogrel (PLAVIX) 75 MG tablet Take 1 tablet (75 mg total) by mouth daily. 06/09/19   Nahser, Wonda Cheng, MD  famotidine (PEPCID AC) 10 MG tablet Take 10 mg by mouth as needed for heartburn or indigestion.    [provider]  fluticasone (FLONASE) 50 MCG/ACT nasal spray Place 1 spray into both nostrils 2 (two) times daily as needed for allergies or rhinitis.     [provider]  furosemide (LASIX) 20 MG tablet Take 2 tablets (40 mg total) by mouth 2 (two) times daily. 10/20/18 10/20/19  [name] T, PA-C  latanoprost (XALATAN) 0.005 % ophthalmic solution Place 1 drop into both eyes at bedtime.  04/17/11   [provider]  loratadine (CLARITIN) 10 MG tablet Take 10 mg by mouth daily.    [provider]  Multiple Vitamin (MULTIVITAMIN WITH MINERALS) TABS tablet Take 1 tablet by mouth daily.    [provider]  nitroGLYCERIN (NITROSTAT) 0.4 MG SL tablet Place 1 tablet (0.4 mg total) under the tongue every 5 (five) minutes as needed for chest pain. 02/24/18 04/25/19  Lyda Jester M, PA-C  pantoprazole (PROTONIX) 40 MG tablet TAKE 1 TABLET(40 MG) BY MOUTH AT BEDTIME 03/28/19   Simmons, Silas Flood M, PA-C  potassium chloride (KLOR-CON) 10 MEQ tablet TAKE 2 TABLETS BY MOUTH TWICE DAILY 04/19/19   Nahser, Wonda Cheng, MD  propranolol (INDERAL) 10 MG tablet Take 10 mg by mouth 2 (two) times daily. (2) 1 times daily     [provider]    simethicone (MYLICON) 80 MG chewable tablet Chew 160 mg by mouth every 6 (six) hours as needed for flatulence.    [provider]  Vitamin D, Ergocalciferol, (DRISDOL) 1.25 MG (50000 UT) CAPS capsule Take 50,000 Units by mouth every Wednesday.     [provider]    Allergies    Clindamycin/lincomycin  Review of Systems   Review of Systems  Constitutional: Negative for fever.  Respiratory: Negative for shortness of breath.   Cardiovascular: Negative for chest pain.  Musculoskeletal: Positive for back pain.  All other systems  reviewed and are negative.   Physical Exam Updated Vital Signs BP 128/87   Pulse (!) 107   Temp 97.6 F (36.4 C) (Oral)   Resp 17   SpO2 98%   Physical Exam Vitals and nursing note reviewed.  Constitutional:      Appearance: She is well-developed. She is not toxic-appearing or diaphoretic.  HENT:     Head: Normocephalic and atraumatic.     Right Ear: External ear normal.     Left Ear: External ear normal.  Eyes:     General: No scleral icterus.       Right eye: No discharge.        Left eye: No discharge.     Conjunctiva/sclera: Conjunctivae normal.  Neck:     Trachea: No tracheal deviation.  Cardiovascular:     Rate and Rhythm: Normal rate and regular rhythm.  Pulmonary:     Effort: Pulmonary effort is normal. No respiratory distress.     Breath sounds: Normal breath sounds. No stridor. No wheezing or rales.  Abdominal:     General: Bowel sounds are normal. There is no distension.     Palpations: Abdomen is soft.     Tenderness: There is no abdominal tenderness. There is no guarding or rebound.  Musculoskeletal:        General: No tenderness.     Cervical back: Neck supple.  Skin:    General: Skin is warm and dry.     Findings: No rash.  Neurological:     Mental Status: She is alert.     Cranial Nerves: No cranial nerve deficit (no facial droop, extraocular movements intact, no slurred speech).     Sensory: No sensory  deficit.     Motor: No abnormal muscle tone or seizure activity.     Coordination: Coordination normal.     ED Results / Procedures / Treatments   Labs (all labs ordered are listed, but only abnormal results are displayed) Labs Reviewed  CBC WITH DIFFERENTIAL/PLATELET - Abnormal; Notable for the following components:      Result Value   WBC 11.2 (*)    Hemoglobin 15.3 (*)    HCT 46.9 (*)    MCV 102.6 (*)    Neutro Abs 9.1 (*)    Monocytes Absolute 1.2 (*)    All other components within normal limits  LIPASE, BLOOD - Abnormal; Notable for the following components:   Lipase 105 (*)    All other components within normal limits  URINALYSIS, ROUTINE W REFLEX MICROSCOPIC - Abnormal; Notable for the following components:   APPearance HAZY (*)    Hgb urine dipstick SMALL (*)    Protein, ur 100 (*)    Leukocytes,Ua LARGE (*)    WBC, UA >50 (*)    Bacteria, UA FEW (*)    All other components within normal limits  COMPREHENSIVE METABOLIC PANEL - Abnormal; Notable for the following components:   Sodium 133 (*)    Potassium 7.5 (*)    CO2 12 (*)    BUN 166 (*)    Creatinine, Ser 5.85 (*)    GFR calc non Af Amer 6 (*)    GFR calc Af Amer 7 (*)    All other components within normal limits  URINE CULTURE  SARS CORONAVIRUS 2 (TAT 6-24 HRS)  BLOOD GAS, VENOUS  CK  SODIUM, URINE, RANDOM  CREATININE, URINE, RANDOM  UREA NITROGEN, URINE    EKG EKG Interpretation  Date/Time:  Sunday June 19 2019  20:06:37 EDT Ventricular Rate:  74 PR Interval:    QRS Duration: 104 QT Interval:  350 QTC Calculation: 389 R Axis:   -109 Text Interpretation: Right and left arm electrode reversal, interpretation assumes no reversal Sinus arrhythmia Inferior infarct, old Consider anterior infarct limb lead reversal is new Confirmed by Linwood Dibbles [phone]) on 06/19/2019 11:24:51 PM   Radiology DG Chest 2 View  Result Date: 06/19/2019 CLINICAL DATA:  Fatigue, generalized weakness. EXAM: CHEST - 2 VIEW  COMPARISON:  Chest x-ray dated 02/20/2018. FINDINGS: Heart size and mediastinal contours are stable. Lungs are clear. No pleural effusion is seen. Osseous structures about the chest are unremarkable. IMPRESSION: No active cardiopulmonary disease. No evidence of pneumonia or pulmonary edema. Electronically Signed   By: Bary Richard M.D.   On: 06/19/2019 20:22    Procedures .Critical Care Performed by: Linwood Dibbles, MD Authorized by: Linwood Dibbles, MD   Critical care provider statement:    Critical care time (minutes):  45   Critical care was time spent personally by me on the following activities:  Discussions with consultants, evaluation of patient's response to treatment, examination of patient, ordering and performing treatments and interventions, ordering and review of laboratory studies, ordering and review of radiographic studies, pulse oximetry, re-evaluation of patient's condition, obtaining history from patient or surrogate and review of old charts   (including critical care time)  Medications Ordered in ED Medications  cefTRIAXone (ROCEPHIN) 1 g in sodium chloride 0.9 % 100 mL IVPB (has no administration in time range)  calcium gluconate 1 g/ 50 mL sodium chloride IVPB (has no administration in time range)  insulin aspart (novoLOG) injection 5 Units (has no administration in time range)    And  dextrose 50 % solution 50 mL (has no administration in time range)  dextrose 10 % infusion (has no administration in time range)  sodium bicarbonate injection 50 mEq (has no administration in time range)  sodium chloride 0.9 % bolus 500 mL (0 mLs Intravenous Stopped 06/19/19 2050)  HYDROcodone-acetaminophen (NORCO/VICODIN) 5-325 MG per tablet 1 tablet (1 tablet Oral Given 06/19/19 2106)    ED Course  I have reviewed the triage vital signs and the nursing notes.  Pertinent labs & imaging results that were available during my care of the patient were reviewed by me and considered in my medical  decision making (see chart for details).  Clinical Course as of Apr 19 0002  Sun Jun 19, 2019  2150 Labs show an elevated lipase as well as white blood cell count.  We will plan on CT scan   [JK]  2330 CXR without pna, chf   [JK]  Mon Jun 20, 2019  0001 Case discussed with Dr Signe Colt, nephrology.  Will consult on pt.  Case discussed with Dr Adela Glimpse, hospitalist.   Will admit pt   [JK]    Clinical Course User Index [JK] Linwood Dibbles, MD   MDM Rules/Calculators/A&P                      Pt presents with fatigue.  She denies abd pain but has been having back pain.  UA does suggest uti.  WBC and lipase elevated.   Metabolic panel is pending.  Will treat for uti.  With her elevated lipase, will ct to evaluate for pancreatitis, malignancy.     Metabolic panel returned.  Pt has acute renal failure.  This would account for her weakness.  Insulin, calcium, bicarb, dextrose ordered for her hyperkalemia.  Etiology of this unclear.  CT scan will assess for obstruction.  Plan on admission to the hospital, consult nephrology  Final Clinical Impression(s) / ED Diagnoses Acute renal failure, hyperkalemia, UTI     Linwood Dibbles, MD 06/19/19 4585    Linwood Dibbles, MD 06/20/19 0001    Linwood Dibbles, MD 06/20/19 0002

## 2019-06-19 NOTE — H&P (Addendum)
Charlene Dickerson:[id number] DOB: 1933-02-11 DOA: 06/19/2019     PCP: Martha Clan, MD   Outpatient Specialists:   CARDS:   Dr. Allyson Sabal, Dr. E[facility] Dr. Cleophas Dunker  Patient arrived to ER on 06/19/19 at 1919  Patient coming from: home Lives alone,     Chief Complaint:  Chief Complaint  Patient presents with  . Fatigue    HPI: Charlene Dickerson is a 86 y.o. female with medical history significant of GERD, hypertension osteoporosis paroxysmal atrial fibrillation history of MI  Sp stenting in 2019 December    Presented with  1 wk of fatigue she has history of CHF with preserved EF and been taking Lasix as well as potassium at home.  She recently been seen by orthopedics for left-sided low back pain she has been taking Tylenol 1 to 2 tablets 3 times a day as well as Advil repeatedly throughout the day for past week to control her severe back pain.  She also have had decreased p.o. intake decrease drinking she has been feeling fairly lightheaded getting confused family unsure if she actually been taking her medications appropriately or not. Denies dysuria but has some urgency to urinate Infectious risk factors:  Reports N/V/    severe fatigue      Has  been vaccinated against COVID x2 last dose in beginning of March   in house  PCR testing  Pending  No results found for: SARSCOV2NAA   Regarding pertinent Chronic problems:     Hyperlipidemia -  on statins Lipitor   HTN on proranolol   chronic CHF diastolic - last echo 2019 EF: 81% -  55%     CAD  - On statin, betablocker, Plavix                 -  followed by cardiology                - last cardiac cath 2019      A. Fib -   CHA2DS2 vas score>3 :  current  on anticoagulation with  Eliquis,  Occurred in the setting of right ventricular infarction she converted back to sinus rhythm from since December 2019 Later on she required a cardioversion and undergone cardioversion in February 2000 2020        -  Rate  control:  Currently controlled with prpranolol        While in ER: UA worrisome for UTI incidental finding potassium of 7.3 with creatinine up to 5.85  peaked T waves on EKG  started treatment for hyperkalemia insulin D50 calcium bicarb Started IV fluids   ER Provider Called: Nephrology   Dr. Signe Colt They Recommend admit to medicine nephrology will continue to follow transfer to [facility] when bed becomes available added bicarb gtt, albuterol 5 mg neb, and Lokelma TID to start now - strict I/O - q 4 hr labs - will follow urine culture.  Have also added ANA, complements, and SPEP/ free light chains along with UP/C to workup - if urine culture negative, need to consider AIN as well    IN ER patient went into wide tachy rhythm She received ca 2 amp 150 of Amiodarone 2 amps of bicarb  Pt remained alert  She was given a synchronized shock and converted to sinus rythm Pt was briefly bagged  Plan for pt to have emergent HD tonight at [facility] Discussed case at length with nephrology  [facility] consult for HD catheter placement but  wAs told at this time no availability  ER provider will attempt to place prior to transfer to [facility] Pt now appears stable  Hospitalist was called for admission for AKI with hyperkalemia  The following Work up has been ordered so far:  Orders Placed This Encounter  Procedures  . Urine Culture  . DG Chest 2 View  . CT ABDOMEN PELVIS WO CONTRAST  . CBC with Differential/Platelet  . Lipase, blood  . Urinalysis, Routine w reflex microscopic  . Comprehensive metabolic panel  . Place Patient on a Cardiac Monitor  . Initiate Carrier Fluid Protocol  . Consult to hospitalist  ALL PATIENTS BEING ADMITTED/HAVING PROCEDURES NEED COVID-19 SCREENING  . ED EKG  . EKG 12-Lead  . EKG 12-Lead  . EKG 12-Lead    Following Medications were ordered in ER: Medications  cefTRIAXone (ROCEPHIN) 1 g in sodium chloride 0.9 % 100 mL IVPB (has no administration in time range)   calcium gluconate 1 g/ 50 mL sodium chloride IVPB (has no administration in time range)  insulin aspart (novoLOG) injection 5 Units (has no administration in time range)    And  dextrose 50 % solution 50 mL (has no administration in time range)  dextrose 10 % infusion (has no administration in time range)  sodium bicarbonate injection 50 mEq (has no administration in time range)  sodium chloride 0.9 % bolus 500 mL (0 mLs Intravenous Stopped 06/19/19 2050)  HYDROcodone-acetaminophen (NORCO/VICODIN) 5-325 MG per tablet 1 tablet (1 tablet Oral Given 06/19/19 2106)        Consult Orders  (From admission, onward)         Start     Ordered   06/19/19 2337  Consult to hospitalist  ALL PATIENTS BEING ADMITTED/HAVING PROCEDURES NEED COVID-19 SCREENING  Once    Comments: ALL PATIENTS BEING ADMITTED/HAVING PROCEDURES NEED COVID-19 SCREENING  Provider:  (Not yet assigned)  Question Answer Comment  Place call to: [facility]   Reason for Consult Admit      06/19/19 2336          Significant initial  Findings: Abnormal Labs Reviewed  CBC WITH DIFFERENTIAL/PLATELET - Abnormal; Notable for the following components:      Result Value   WBC 11.2 (*)    Hemoglobin 15.3 (*)    HCT 46.9 (*)    MCV 102.6 (*)    Neutro Abs 9.1 (*)    Monocytes Absolute 1.2 (*)    All other components within normal limits  LIPASE, BLOOD - Abnormal; Notable for the following components:   Lipase 105 (*)    All other components within normal limits  URINALYSIS, ROUTINE W REFLEX MICROSCOPIC - Abnormal; Notable for the following components:   APPearance HAZY (*)    Hgb urine dipstick SMALL (*)    Protein, ur 100 (*)    Leukocytes,Ua LARGE (*)    WBC, UA >50 (*)    Bacteria, UA FEW (*)    All other components within normal limits  COMPREHENSIVE METABOLIC PANEL - Abnormal; Notable for the following components:   Sodium 133 (*)    Potassium 7.5 (*)    CO2 12 (*)    BUN 166 (*)    Creatinine, Ser 5.85  (*)    GFR calc non Af Amer 6 (*)    GFR calc Af Amer 7 (*)    All other components within normal limits     Otherwise labs showing:    Recent Labs  Lab 06/19/19 2130  NA 133*  K 7.5*  CO2 12*  GLUCOSE 95  BUN 166*  CREATININE 5.85*  CALCIUM 9.5   Cr  Up from baseline see below Lab Results  Component Value Date   CREATININE 5.85 (H) 06/19/2019   CREATININE 1.04 (H) 04/25/2019   CREATININE 1.12 (H) 11/11/2018    Recent Labs  Lab 06/19/19 2130  AST 21  ALT 20  ALKPHOS 47  BILITOT 0.6  PROT 7.6  ALBUMIN 4.4   Lab Results  Component Value Date   CALCIUM 9.5 06/19/2019   PHOS 1.7 (L) 02/24/2018    WBC      Component Value Date/Time   WBC 11.2 (H) 06/19/2019 2009   ANC    Component Value Date/Time   NEUTROABS 9.1 (H) 06/19/2019 2009   ALC No components found for: LYMPHAB    Plt: Lab Results  Component Value Date   PLT 269 06/19/2019   Lactic Acid, Venous    Component Value Date/Time   LATICACIDVEN 1.8 02/20/2018 2305      ABG    Component Value Date/Time   PHART 7.356 02/18/2018 1828   PCO2ART 34.1 02/18/2018 1828   PO2ART 391.0 (H) 02/18/2018 1828   HCO3 12.8 (L) 06/20/2019 0049   TCO2 18 (L) 06/20/2019 0125   ACIDBASEDEF 13.8 (H) 06/20/2019 0049   O2SAT 53.5 06/20/2019 0049    HG/HCT  Up from baseline see below    Component Value Date/Time   HGB 15.3 (H) 06/19/2019 2009   HGB 14.2 07/20/2018 1345   HCT 46.9 (H) 06/19/2019 2009   HCT 40.6 07/20/2018 1345    Recent Labs  Lab 06/19/19 2009  LIPASE 105*   No results for input(s): AMMONIA in the last 168 hours.  No components found for: LABALBU   Cardiac Panel (last 3 results) Recent Labs    06/19/19 2130  CKTOTAL 359*     ECG: Ordered Personally reviewed by me showing: HR : 74 Rhythm: NSR,    no evidence of ischemic changes, peaked t waves QTC 359   ProBNP (last 3 results) Recent Labs    07/08/18 1300 07/20/18 1345  PROBNP 2,604* 2,553*     UA  evidence of UTI      Urine analysis:    Component Value Date/Time   COLORURINE YELLOW 06/19/2019 2009   APPEARANCEUR HAZY (A) 06/19/2019 2009   LABSPEC 1.011 06/19/2019 2009   PHURINE 5.0 06/19/2019 2009   GLUCOSEU NEGATIVE 06/19/2019 2009   HGBUR SMALL (A) 06/19/2019 2009   BILIRUBINUR NEGATIVE 06/19/2019 2009   KETONESUR NEGATIVE 06/19/2019 2009   PROTEINUR 100 (A) 06/19/2019 2009   NITRITE NEGATIVE 06/19/2019 2009   LEUKOCYTESUR LARGE (A) 06/19/2019 2009      Ordered   CXR -  NON acute  CTabd/pelvis -  nonacute L4 compression fracture      ED Triage Vitals  Enc Vitals Group     BP 06/19/19 1944 140/70     Pulse Rate 06/19/19 1944 75     Resp 06/19/19 1944 17     Temp 06/19/19 1944 97.6 F (36.4 C)     Temp Source 06/19/19 1944 Oral     SpO2 06/19/19 1944 95 %     Weight --      Height --      Head Circumference --      Peak Flow --      Pain Score 06/19/19 1949 0     Pain Loc --      Pain  Edu? --      Excl. in GC? --   TMAX(24)@       Latest  Blood pressure 128/87, pulse (!) 107, temperature 97.6 F (36.4 C), temperature source Oral, resp. rate 17, SpO2 98 %.     Review of Systems:    Pertinent positives include: fatigue, nausea, vomiting,   Constitutional:  No weight loss, night sweats, Fevers, chills,  weight loss  HEENT:  No headaches, Difficulty swallowing,Tooth/dental problems,Sore throat,  No sneezing, itching, ear ache, nasal congestion, post nasal drip,  Cardio-vascular:  No chest pain, Orthopnea, PND, anasarca, dizziness, palpitations.no Bilateral lower extremity swelling  GI:  No heartburn, indigestion, abdominal pain, diarrhea, change in bowel habits, loss of appetite, melena, blood in stool, hematemesis Resp:  no shortness of breath at rest. No dyspnea on exertion, No excess mucus, no productive cough, No non-productive cough, No coughing up of blood.No change in color of mucus.No wheezing. Skin:  no rash or lesions. No jaundice GU:  no dysuria, change  in color of urine, no urgency or frequency. No straining to urinate.  No flank pain.  Musculoskeletal:  No joint pain or no joint swelling. No decreased range of motion. No back pain.  Psych:  No change in mood or affect. No depression or anxiety. No memory loss.  Neuro: no localizing neurological complaints, no tingling, no weakness, no double vision, no gait abnormality, no slurred speech, no confusion  All systems reviewed and apart from HOPI all are negative  Past Medical History:   Past Medical History:  Diagnosis Date  . Diverticulosis of colon   . Essential tremor   . GERD (gastroesophageal reflux disease)    occasional (no meds)  . History of basal cell carcinoma excision 2012  . Hypertension   . Osteoporosis   . PONV (postoperative nausea and vomiting)   . Rectocele    symptomatic      Past Surgical History:  Procedure Laterality Date  . BREAST EXCISIONAL BIOPSY Left 1995  . BREAST EXCISIONAL BIOPSY Right 1988  . CARDIOVERSION N/A 04/16/2018   Procedure: CARDIOVERSION;  Surgeon: Parke Poisson, MD;  Location: [facility] ENDOSCOPY;  Service: Cardiovascular;  Laterality: N/A;  . CATARACT EXTRACTION W/ INTRAOCULAR LENS  IMPLANT, BILATERAL  right 09/ 2013;  left 10/ 2013  . COLONOSCOPY  last one 04/10/ 2012  . CORONARY STENT INTERVENTION N/A 02/18/2018   Procedure: CORONARY STENT INTERVENTION;  Surgeon: Runell Gess, MD;  Location: [facility] INVASIVE CV LAB;  Service: Cardiovascular;  Laterality: N/A;  . D & C HYSTEROSCOPY W/ RESECTION ENDOMETRIAL POLYP AND LESION  07-27-2007   dr Ambrose Mantle   . LEFT HEART CATH AND CORONARY ANGIOGRAPHY N/A 02/18/2018   Procedure: LEFT HEART CATH AND CORONARY ANGIOGRAPHY;  Surgeon: Runell Gess, MD;  Location: [facility] INVASIVE CV LAB;  Service: Cardiovascular;  Laterality: N/A;  . RECTOCELE REPAIR N/A 11/26/2017   Procedure: POSTERIOR REPAIR (RECTOCELE);  Surgeon: Tracey Harries, MD;  Location: [facility];  Service: Gynecology;   Laterality: N/A;  OUTPT IN BED  . TEMPORARY PACEMAKER N/A 02/18/2018   Procedure: TEMPORARY PACEMAKER;  Surgeon: Runell Gess, MD;  Location: [facility] INVASIVE CV LAB;  Service: Cardiovascular;  Laterality: N/A;  . TUBAL LIGATION Bilateral yrs ago    Social History:  Ambulatory   independently but at times needs a walker     reports that she quit smoking about 58 years ago. She has never used smokeless tobacco. She reports that she does not drink alcohol  or use drugs.   Family History:   Family History  Problem Relation Age of Onset  . Cancer Sister        Breast  . Heart disease Sister   . Breast cancer Sister   . Heart disease Father   . Cancer Daughter        breast  . Breast cancer Daughter   . Cancer Maternal Aunt        breast  . Breast cancer Maternal Aunt   . Cancer Maternal Grandmother        breast  . Breast cancer Maternal Grandmother     Allergies: Allergies  Allergen Reactions  . Clindamycin/Lincomycin Diarrhea    "c-diff"     Prior to Admission medications   Medication Sig Start Date End Date Taking? Authorizing Provider  acetaminophen (TYLENOL) 500 MG tablet Take 1,000 mg by mouth every 6 (six) hours as needed (pain.).     [provider]  ALPRAZolam Prudy Feeler) 0.5 MG tablet Take 0.25-0.5 tablets by mouth 2 (two) times daily as needed for anxiety.  03/05/18   [provider]  apixaban (ELIQUIS) 5 MG TABS tablet TAKE 1 TABLET(5 MG) BY MOUTH TWICE DAILY 06/13/19   Nahser, Deloris Ping, MD  atorvastatin (LIPITOR) 40 MG tablet TAKE 1 TABLET(40 MG) BY MOUTH DAILY AT 6 PM 04/26/19   Nahser, Deloris Ping, MD  calcium citrate (CALCITRATE - DOSED IN MG ELEMENTAL CALCIUM) 950 MG tablet Take 200 mg of elemental calcium by mouth daily.    [provider]  clopidogrel (PLAVIX) 75 MG tablet Take 1 tablet (75 mg total) by mouth daily. 06/09/19   Nahser, Deloris Ping, MD  famotidine (PEPCID AC) 10 MG tablet Take 10 mg by mouth as needed for heartburn or  indigestion.    [provider]  fluticasone (FLONASE) 50 MCG/ACT nasal spray Place 1 spray into both nostrils 2 (two) times daily as needed for allergies or rhinitis.     [provider]  furosemide (LASIX) 20 MG tablet Take 2 tablets (40 mg total) by mouth 2 (two) times daily. 10/20/18 10/20/19  Tereso Newcomer T, PA-C  latanoprost (XALATAN) 0.005 % ophthalmic solution Place 1 drop into both eyes at bedtime.  04/17/11   [provider]  loratadine (CLARITIN) 10 MG tablet Take 10 mg by mouth daily.    [provider]  Multiple Vitamin (MULTIVITAMIN WITH MINERALS) TABS tablet Take 1 tablet by mouth daily.    [provider]  nitroGLYCERIN (NITROSTAT) 0.4 MG SL tablet Place 1 tablet (0.4 mg total) under the tongue every 5 (five) minutes as needed for chest pain. 02/24/18 04/25/19  Robbie Lis M, PA-C  pantoprazole (PROTONIX) 40 MG tablet TAKE 1 TABLET(40 MG) BY MOUTH AT BEDTIME 03/28/19   Simmons, Avon Gully M, PA-C  potassium chloride (KLOR-CON) 10 MEQ tablet TAKE 2 TABLETS BY MOUTH TWICE DAILY 04/19/19   Nahser, Deloris Ping, MD  propranolol (INDERAL) 10 MG tablet Take 10 mg by mouth 2 (two) times daily. (2) 1 times daily     [provider]  simethicone (MYLICON) 80 MG chewable tablet Chew 160 mg by mouth every 6 (six) hours as needed for flatulence.    [provider]  Vitamin D, Ergocalciferol, (DRISDOL) 1.25 MG (50000 UT) CAPS capsule Take 50,000 Units by mouth every Wednesday.     [provider]   Physical Exam: Blood pressure 128/87, pulse (!) 107, temperature 97.6 F (36.4 C), temperature source Oral, resp. rate 17, SpO2 98 %.  1. General:  in No Acute distress   acutely ill -appearing 2. Psychological: Alert and  Oriented to self and situation 3. Head/ENT:   Very dry Mucous Membranes                          Head Non traumatic, neck supple                            Poor Dentition 4. SKIN: Significantly decreased Skin  turgor,  Skin clean Dry and intact no rash 5. Heart: Regular rate and rhythm no Murmur, no Rub or gallop 6. Lungs:  no wheezes or crackles   7. Abdomen: Soft, non-tender, Non distended  bowel sounds present 8. Lower extremities: no clubbing, cyanosis, no edema 9. Neurologically Grossly intact, moving all 4 extremities equally   10. MSK: Normal range of motion   All other LABS:     Recent Labs  Lab 06/19/19 2009  WBC 11.2*  NEUTROABS 9.1*  HGB 15.3*  HCT 46.9*  MCV 102.6*  PLT 269     Recent Labs  Lab 06/19/19 2130  NA 133*  K 7.5*  CL 108  CO2 12*  GLUCOSE 95  BUN 166*  CREATININE 5.85*  CALCIUM 9.5     Recent Labs  Lab 06/19/19 2130  AST 21  ALT 20  ALKPHOS 47  BILITOT 0.6  PROT 7.6  ALBUMIN 4.4       Cultures: No results found for: SDES, SPECREQUEST, CULT, REPTSTATUS   Radiological Exams on Admission: CT ABDOMEN PELVIS WO CONTRAST  Result Date: 06/20/2019 CLINICAL DATA:  Lower back pain.  Pancreatitis suspected EXAM: CT ABDOMEN AND PELVIS WITHOUT CONTRAST TECHNIQUE: Multidetector CT imaging of the abdomen and pelvis was performed following the standard protocol without IV contrast. COMPARISON:  None. FINDINGS: Lower chest: Densely calcified visualized coronary arteries. Lung bases clear. No effusions. Hepatobiliary: No focal hepatic abnormality. Gallbladder unremarkable. Pancreas: No focal abnormality or ductal dilatation. Spleen: No focal abnormality.  Normal size. Adrenals/Urinary Tract: 4.6 cm cyst in the midpole of the left kidney. No hydronephrosis. Adrenal glands and urinary bladder unremarkable. Stomach/Bowel: Diffuse colonic diverticulosis, most pronounced in the sigmoid colon. No active diverticulitis. No evidence of bowel obstruction. Vascular/Lymphatic: Aortic atherosclerosis. No enlarged abdominal or pelvic lymph nodes. Reproductive: Uterus and adnexa unremarkable.  No mass. Other: No free fluid or free air. Musculoskeletal: Moderate compression  fracture through the superior endplate of L4 appears acute. Diffuse degenerative disc and facet disease. IMPRESSION: Diffuse colonic diverticulosis.  No active diverticulitis. Aortic atherosclerosis. Moderate L4 compression fracture, which appears acute. Coronary artery disease. Electronically Signed   By: Rolm Baptise M.D.   On: 06/20/2019 00:05   DG Chest 2 View  Result Date: 06/19/2019 CLINICAL DATA:  Fatigue, generalized weakness. EXAM: CHEST - 2 VIEW COMPARISON:  Chest x-ray dated 02/20/2018. FINDINGS: Heart size and mediastinal contours are stable. Lungs are clear. No pleural effusion is seen. Osseous structures about the chest are unremarkable. IMPRESSION: No active cardiopulmonary disease. No evidence of pneumonia or pulmonary edema. Electronically Signed   By: Franki Cabot M.D.   On: 06/19/2019 20:22    Chart has been reviewed   Assessment/Plan   86 y.o. female with medical history significant of GERD, hypertension osteoporosis paroxysmal atrial fibrillation history of MI  Sp stenting in 2019 December Admitted for AKI and hyperkalemia went into VT had to get shock X1 now in sinus, plant  for emergent HD tonight  Present on Admission: . AKI (acute kidney injury) (HCC) - likely due to dehydration, lasix decreased PO intake and taking NsAIDS Nephrology aware will attempt emergent HD tonight   . Paroxysmal atrial fibrillation (HCC) - restart Eliquis when able, suspect pt is Uremic at this time , cont propranolol   . Hyperkalemia - result   . Metabolic acidosis - on Bicarb gtt   . Uremia - will need HD, nephrology aware  . Closed compression fracture of L4 lumbar vertebra, initial encounter (HCC) - pain managemnt once stable would eval for possible need for kyphoplasty Pt on Plavix for CAD and eliquis and uremic, will need to clear from cardiology stand point for holding plavix  Possible UTI - had one dose of rocephin in ER await result of urine culture,  Adjust antibiotics  accordigly  CAD - continue plavix and statin  VT? - currently back to sinus after shock, most likley in the setting of hyperkalemia, wil notify cardiology obtain echo in AM pt with hx of CAD   Other plan as per orders.  DVT prophylaxis:  SCD       Code Status:  FULL CODE as per patient   I had personally discussed CODE STATUS with patient   Family Communication:   Family  at  Bedside  plan of care was discussed  With Daughter   Disposition Plan:      To home once workup is complete and patient is stable   Following barriers for discharge:                            Electrolytes corrected                             Pain controlled with PO medications                                                             Will need to be able to tolerate PO                            Will likely need home health, home O2, set up                           Will need consultants to evaluate patient prior to discharge                      Would benefit from PT/OT eval prior to DC  Ordered                                     Transition of care consulted                   Nutrition    consulted                                       Consults called: nephrology, discussed with PCCM ,  email cardiology  Admission status:  ED Disposition    ED Disposition Condition Comment   Admit  Hospital Area: [facility] [[id number]]  Level of Care: Progressive [102]  Admit to Progressive based on following criteria: NEPHROLOGY stable condition requiring close monitoring for AKI, requiring Hemodialysis or Peritoneal Dialysis either from expected electrolyte imbalance, acidosis, or fluid overload that can be managed by NIPPV or high flow oxygen.  May admit patient to [facility] or [facility] if equivalent level of care is available:: No  Covid Evaluation: Asymptomatic Screening Protocol (No Symptoms)  Diagnosis: AKI (acute kidney injury) [facility]) [[id number]]  Admitting Physician: Therisa Doyne  [[id number]]  Attending Physician: Therisa Doyne [[id number]]  Estimated length of stay: 3 - 4 days  Certification:: I certify this patient will need inpatient services for at least 2 midnights          inpatient     I Expect 2 midnight stay secondary to severity of patient's current illness need for inpatient interventions justified by the following:  hemodynamic instability despite optimal treatment (tachycardia  )  Severe lab/radiological/exam abnormalities including:    hyperkalemia, aki  and extensive comorbidities including:  CHF  CAD .  Chronic anticoagulation  That are currently affecting medical management.   I expect  patient to be hospitalized for 2 midnights requiring inpatient medical care.  Patient is at high risk for adverse outcome (such as loss of life or disability) if not treated.  Indication for inpatient stay as follows:  Severe change from baseline regarding mental status Hemodynamic instability despite maximal medical therapy,  ongoing suicidal ideations,  severe pain requiring acute inpatient management,  inability to maintain oral hydration   persistent chest pain despite medical management Need for operative/procedural  intervention New or worsening hypoxia  Need for IV antibiotics, IV fluids, IV rate controling medications, IV antihypertensives, IV pain medications, IV anticoagulation    Level of care       SDU tele indefinitely please discontinue once patient no longer qualifies   Precautions: admitted as asymptomatic screening protocol   PPE: Used by the provider:   P100  eye Goggles,  Gloves       Critical  Patient is critically ill due to  hemodynamic instability   They are at high risk for life/limb threatening clinical deterioration requiring frequent reassessment and modifications of care.  Services provided include examination of the patient, review of relevant ancillary tests, prescription of lifesaving therapies, review of  medications and prophylactic therapy.  Total critical care time excluding separately billable procedures: over90 Minutes.   Ahuva Poynor 06/19/2019, 2:33 AM    [facility]     after 2 AM please page floor coverage PA If 7AM-7PM, please contact the day team taking care of the patient using [email or web address]   Patient was evaluated in the context of the global COVID-19 pandemic, which necessitated consideration that the patient might be at risk for infection with the SARS-CoV-2 virus that causes COVID-19. Institutional protocols and algorithms that pertain to the evaluation of patients at risk for COVID-19 are in a state of rapid change based on information released by regulatory bodies including the CDC and federal and state organizations. These policies and algorithms were followed during the patient's care.

## 2019-06-20 ENCOUNTER — Inpatient Hospital Stay (HOSPITAL_COMMUNITY): Payer: Medicare Other

## 2019-06-20 DIAGNOSIS — I5032 Chronic diastolic (congestive) heart failure: Secondary | ICD-10-CM

## 2019-06-20 DIAGNOSIS — S32040A Wedge compression fracture of fourth lumbar vertebra, initial encounter for closed fracture: Secondary | ICD-10-CM | POA: Diagnosis present

## 2019-06-20 DIAGNOSIS — N19 Unspecified kidney failure: Secondary | ICD-10-CM

## 2019-06-20 DIAGNOSIS — I351 Nonrheumatic aortic (valve) insufficiency: Secondary | ICD-10-CM | POA: Diagnosis not present

## 2019-06-20 DIAGNOSIS — E875 Hyperkalemia: Secondary | ICD-10-CM | POA: Diagnosis present

## 2019-06-20 DIAGNOSIS — R5383 Other fatigue: Secondary | ICD-10-CM | POA: Diagnosis present

## 2019-06-20 DIAGNOSIS — I442 Atrioventricular block, complete: Secondary | ICD-10-CM | POA: Diagnosis present

## 2019-06-20 DIAGNOSIS — I10 Essential (primary) hypertension: Secondary | ICD-10-CM

## 2019-06-20 DIAGNOSIS — M4856XA Collapsed vertebra, not elsewhere classified, lumbar region, initial encounter for fracture: Secondary | ICD-10-CM | POA: Diagnosis present

## 2019-06-20 DIAGNOSIS — E440 Moderate protein-calorie malnutrition: Secondary | ICD-10-CM | POA: Diagnosis present

## 2019-06-20 DIAGNOSIS — I480 Paroxysmal atrial fibrillation: Secondary | ICD-10-CM | POA: Diagnosis present

## 2019-06-20 DIAGNOSIS — Z85828 Personal history of other malignant neoplasm of skin: Secondary | ICD-10-CM | POA: Diagnosis not present

## 2019-06-20 DIAGNOSIS — I472 Ventricular tachycardia: Secondary | ICD-10-CM | POA: Diagnosis present

## 2019-06-20 DIAGNOSIS — I130 Hypertensive heart and chronic kidney disease with heart failure and stage 1 through stage 4 chronic kidney disease, or unspecified chronic kidney disease: Secondary | ICD-10-CM | POA: Diagnosis present

## 2019-06-20 DIAGNOSIS — R45851 Suicidal ideations: Secondary | ICD-10-CM | POA: Diagnosis present

## 2019-06-20 DIAGNOSIS — E860 Dehydration: Secondary | ICD-10-CM | POA: Diagnosis present

## 2019-06-20 DIAGNOSIS — N179 Acute kidney failure, unspecified: Secondary | ICD-10-CM | POA: Diagnosis present

## 2019-06-20 DIAGNOSIS — K5730 Diverticulosis of large intestine without perforation or abscess without bleeding: Secondary | ICD-10-CM | POA: Diagnosis present

## 2019-06-20 DIAGNOSIS — I252 Old myocardial infarction: Secondary | ICD-10-CM | POA: Diagnosis not present

## 2019-06-20 DIAGNOSIS — I361 Nonrheumatic tricuspid (valve) insufficiency: Secondary | ICD-10-CM

## 2019-06-20 DIAGNOSIS — N1831 Chronic kidney disease, stage 3a: Secondary | ICD-10-CM | POA: Diagnosis present

## 2019-06-20 DIAGNOSIS — I2510 Atherosclerotic heart disease of native coronary artery without angina pectoris: Secondary | ICD-10-CM | POA: Diagnosis present

## 2019-06-20 DIAGNOSIS — Z87891 Personal history of nicotine dependence: Secondary | ICD-10-CM | POA: Diagnosis not present

## 2019-06-20 DIAGNOSIS — K219 Gastro-esophageal reflux disease without esophagitis: Secondary | ICD-10-CM | POA: Diagnosis present

## 2019-06-20 DIAGNOSIS — E872 Acidosis, unspecified: Secondary | ICD-10-CM

## 2019-06-20 DIAGNOSIS — Z20822 Contact with and (suspected) exposure to covid-19: Secondary | ICD-10-CM | POA: Diagnosis present

## 2019-06-20 DIAGNOSIS — M810 Age-related osteoporosis without current pathological fracture: Secondary | ICD-10-CM | POA: Diagnosis present

## 2019-06-20 DIAGNOSIS — E785 Hyperlipidemia, unspecified: Secondary | ICD-10-CM | POA: Diagnosis present

## 2019-06-20 DIAGNOSIS — Z7901 Long term (current) use of anticoagulants: Secondary | ICD-10-CM | POA: Diagnosis not present

## 2019-06-20 DIAGNOSIS — G250 Essential tremor: Secondary | ICD-10-CM | POA: Diagnosis present

## 2019-06-20 DIAGNOSIS — N390 Urinary tract infection, site not specified: Secondary | ICD-10-CM | POA: Diagnosis present

## 2019-06-20 HISTORY — DX: Chronic diastolic (congestive) heart failure: I50.32

## 2019-06-20 HISTORY — DX: Acute kidney failure, unspecified: N17.9

## 2019-06-20 HISTORY — DX: Acidosis: E87.2

## 2019-06-20 HISTORY — DX: Essential (primary) hypertension: I10

## 2019-06-20 HISTORY — DX: Acidosis, unspecified: E87.20

## 2019-06-20 HISTORY — DX: Hyperkalemia: E87.5

## 2019-06-20 HISTORY — DX: Unspecified kidney failure: N19

## 2019-06-20 HISTORY — DX: Wedge compression fracture of fourth lumbar vertebra, initial encounter for closed fracture: S32.040A

## 2019-06-20 HISTORY — DX: Atherosclerotic heart disease of native coronary artery without angina pectoris: I25.10

## 2019-06-20 LAB — CBC WITH DIFFERENTIAL/PLATELET
Abs Immature Granulocytes: 0.06 10*3/uL (ref 0.00–0.07)
Abs Immature Granulocytes: 0.05 10*3/uL (ref 0.00–0.07)
Basophils Absolute: 0 10*3/uL (ref 0.0–0.1)
Basophils Absolute: 0 10*3/uL (ref 0.0–0.1)
Basophils Relative: 0 %
Basophils Relative: 0 %
Eosinophils Absolute: 0 10*3/uL (ref 0.0–0.5)
Eosinophils Absolute: 0 10*3/uL (ref 0.0–0.5)
Eosinophils Relative: 0 %
Eosinophils Relative: 0 %
HCT: 40.2 % (ref 36.0–46.0)
HCT: 36.6 % (ref 36.0–46.0)
Hemoglobin: 13.8 g/dL (ref 12.0–15.0)
Hemoglobin: 12.8 g/dL (ref 12.0–15.0)
Immature Granulocytes: 1 %
Immature Granulocytes: 1 %
Lymphocytes Relative: 6 %
Lymphocytes Relative: 7 %
Lymphs Abs: 0.7 10*3/uL (ref 0.7–4.0)
Lymphs Abs: 0.6 10*3/uL — ABNORMAL LOW (ref 0.7–4.0)
MCH: 33.7 pg (ref 26.0–34.0)
MCH: 33.2 pg (ref 26.0–34.0)
MCHC: 34.3 g/dL (ref 30.0–36.0)
MCHC: 35 g/dL (ref 30.0–36.0)
MCV: 98 fL (ref 80.0–100.0)
MCV: 94.8 fL (ref 80.0–100.0)
Monocytes Absolute: 1.7 10*3/uL — ABNORMAL HIGH (ref 0.1–1.0)
Monocytes Absolute: 1.2 10*3/uL — ABNORMAL HIGH (ref 0.1–1.0)
Monocytes Relative: 14 %
Monocytes Relative: 12 %
Neutro Abs: 10.1 10*3/uL — ABNORMAL HIGH (ref 1.7–7.7)
Neutro Abs: 7.7 10*3/uL (ref 1.7–7.7)
Neutrophils Relative %: 79 %
Neutrophils Relative %: 80 %
Platelets: 249 10*3/uL (ref 150–400)
Platelets: 215 10*3/uL (ref 150–400)
RBC: 4.1 MIL/uL (ref 3.87–5.11)
RBC: 3.86 MIL/uL — ABNORMAL LOW (ref 3.87–5.11)
RDW: 13 % (ref 11.5–15.5)
RDW: 12.6 % (ref 11.5–15.5)
WBC: 12.6 10*3/uL — ABNORMAL HIGH (ref 4.0–10.5)
WBC: 9.5 10*3/uL (ref 4.0–10.5)
nRBC: 0 % (ref 0.0–0.2)
nRBC: 0 % (ref 0.0–0.2)

## 2019-06-20 LAB — CBC
HCT: 40.8 % (ref 36.0–46.0)
Hemoglobin: 14.2 g/dL (ref 12.0–15.0)
MCH: 34.5 pg — ABNORMAL HIGH (ref 26.0–34.0)
MCHC: 34.8 g/dL (ref 30.0–36.0)
MCV: 99.3 fL (ref 80.0–100.0)
Platelets: 227 10*3/uL (ref 150–400)
RBC: 4.11 MIL/uL (ref 3.87–5.11)
RDW: 12.9 % (ref 11.5–15.5)
WBC: 8 10*3/uL (ref 4.0–10.5)
nRBC: 0 % (ref 0.0–0.2)

## 2019-06-20 LAB — RENAL FUNCTION PANEL
Albumin: 4 g/dL (ref 3.5–5.0)
Albumin: 3 g/dL — ABNORMAL LOW (ref 3.5–5.0)
Albumin: 3 g/dL — ABNORMAL LOW (ref 3.5–5.0)
Albumin: 2.7 g/dL — ABNORMAL LOW (ref 3.5–5.0)
Anion gap: 15 (ref 5–15)
Anion gap: 13 (ref 5–15)
Anion gap: 10 (ref 5–15)
Anion gap: 8 (ref 5–15)
BUN: 163 mg/dL — ABNORMAL HIGH (ref 8–23)
BUN: 83 mg/dL — ABNORMAL HIGH (ref 8–23)
BUN: 85 mg/dL — ABNORMAL HIGH (ref 8–23)
BUN: 78 mg/dL — ABNORMAL HIGH (ref 8–23)
CO2: 16 mmol/L — ABNORMAL LOW (ref 22–32)
CO2: 26 mmol/L (ref 22–32)
CO2: 28 mmol/L (ref 22–32)
CO2: 28 mmol/L (ref 22–32)
Calcium: 12.7 mg/dL — ABNORMAL HIGH (ref 8.9–10.3)
Calcium: 9.2 mg/dL (ref 8.9–10.3)
Calcium: 9 mg/dL (ref 8.9–10.3)
Calcium: 8.4 mg/dL — ABNORMAL LOW (ref 8.9–10.3)
Chloride: 106 mmol/L (ref 98–111)
Chloride: 96 mmol/L — ABNORMAL LOW (ref 98–111)
Chloride: 98 mmol/L (ref 98–111)
Chloride: 101 mmol/L (ref 98–111)
Creatinine, Ser: 5.57 mg/dL — ABNORMAL HIGH (ref 0.44–1.00)
Creatinine, Ser: 3.57 mg/dL — ABNORMAL HIGH (ref 0.44–1.00)
Creatinine, Ser: 3.36 mg/dL — ABNORMAL HIGH (ref 0.44–1.00)
Creatinine, Ser: 2.66 mg/dL — ABNORMAL HIGH (ref 0.44–1.00)
GFR calc Af Amer: 8 mL/min — ABNORMAL LOW (ref >60)
GFR calc Af Amer: 13 mL/min — ABNORMAL LOW (ref >60)
GFR calc Af Amer: 14 mL/min — ABNORMAL LOW (ref >60)
GFR calc Af Amer: 18 mL/min — ABNORMAL LOW (ref >60)
GFR calc non Af Amer: 6 mL/min — ABNORMAL LOW (ref >60)
GFR calc non Af Amer: 11 mL/min — ABNORMAL LOW (ref >60)
GFR calc non Af Amer: 12 mL/min — ABNORMAL LOW (ref >60)
GFR calc non Af Amer: 16 mL/min — ABNORMAL LOW (ref >60)
Glucose, Bld: 113 mg/dL — ABNORMAL HIGH (ref 70–99)
Glucose, Bld: 213 mg/dL — ABNORMAL HIGH (ref 70–99)
Glucose, Bld: 190 mg/dL — ABNORMAL HIGH (ref 70–99)
Glucose, Bld: 135 mg/dL — ABNORMAL HIGH (ref 70–99)
Phosphorus: 7.1 mg/dL — ABNORMAL HIGH (ref 2.5–4.6)
Phosphorus: 4.4 mg/dL (ref 2.5–4.6)
Phosphorus: 4.1 mg/dL (ref 2.5–4.6)
Phosphorus: 3.8 mg/dL (ref 2.5–4.6)
Potassium: 6.3 mmol/L (ref 3.5–5.1)
Potassium: 4.4 mmol/L (ref 3.5–5.1)
Potassium: 4.5 mmol/L (ref 3.5–5.1)
Potassium: 4.2 mmol/L (ref 3.5–5.1)
Sodium: 137 mmol/L (ref 135–145)
Sodium: 135 mmol/L (ref 135–145)
Sodium: 136 mmol/L (ref 135–145)
Sodium: 137 mmol/L (ref 135–145)

## 2019-06-20 LAB — I-STAT CHEM 8, ED
BUN: >130 mg/dL — ABNORMAL HIGH (ref 8–23)
Calcium, Ion: 1.7 mmol/L (ref 1.15–1.40)
Chloride: 108 mmol/L (ref 98–111)
Creatinine, Ser: 6 mg/dL — ABNORMAL HIGH (ref 0.44–1.00)
Glucose, Bld: 170 mg/dL — ABNORMAL HIGH (ref 70–99)
HCT: 38 % (ref 36.0–46.0)
Hemoglobin: 12.9 g/dL (ref 12.0–15.0)
Potassium: 6.2 mmol/L — ABNORMAL HIGH (ref 3.5–5.1)
Sodium: 134 mmol/L — ABNORMAL LOW (ref 135–145)
TCO2: 18 mmol/L — ABNORMAL LOW (ref 22–32)

## 2019-06-20 LAB — COMPREHENSIVE METABOLIC PANEL
ALT: 21 U/L (ref 0–44)
ALT: 20 U/L (ref 0–44)
AST: 22 U/L (ref 15–41)
AST: 21 U/L (ref 15–41)
Albumin: 3.6 g/dL (ref 3.5–5.0)
Albumin: 3.3 g/dL — ABNORMAL LOW (ref 3.5–5.0)
Alkaline Phosphatase: 44 U/L (ref 38–126)
Alkaline Phosphatase: 43 U/L (ref 38–126)
Anion gap: 17 — ABNORMAL HIGH (ref 5–15)
Anion gap: 14 (ref 5–15)
BUN: 160 mg/dL — ABNORMAL HIGH (ref 8–23)
BUN: 80 mg/dL — ABNORMAL HIGH (ref 8–23)
CO2: 17 mmol/L — ABNORMAL LOW (ref 22–32)
CO2: 22 mmol/L (ref 22–32)
Calcium: 12.2 mg/dL — ABNORMAL HIGH (ref 8.9–10.3)
Calcium: 10 mg/dL (ref 8.9–10.3)
Chloride: 101 mmol/L (ref 98–111)
Chloride: 99 mmol/L (ref 98–111)
Creatinine, Ser: 5.4 mg/dL — ABNORMAL HIGH (ref 0.44–1.00)
Creatinine, Ser: 3.27 mg/dL — ABNORMAL HIGH (ref 0.44–1.00)
GFR calc Af Amer: 8 mL/min — ABNORMAL LOW (ref >60)
GFR calc Af Amer: 14 mL/min — ABNORMAL LOW (ref >60)
GFR calc non Af Amer: 7 mL/min — ABNORMAL LOW (ref >60)
GFR calc non Af Amer: 12 mL/min — ABNORMAL LOW (ref >60)
Glucose, Bld: 174 mg/dL — ABNORMAL HIGH (ref 70–99)
Glucose, Bld: 199 mg/dL — ABNORMAL HIGH (ref 70–99)
Potassium: 5.4 mmol/L — ABNORMAL HIGH (ref 3.5–5.1)
Potassium: 4.5 mmol/L (ref 3.5–5.1)
Sodium: 135 mmol/L (ref 135–145)
Sodium: 135 mmol/L (ref 135–145)
Total Bilirubin: 0.8 mg/dL (ref 0.3–1.2)
Total Bilirubin: 1.2 mg/dL (ref 0.3–1.2)
Total Protein: 6.6 g/dL (ref 6.5–8.1)
Total Protein: 6.1 g/dL — ABNORMAL LOW (ref 6.5–8.1)

## 2019-06-20 LAB — BLOOD GAS, VENOUS
Acid-base deficit: 13.8 mmol/L — ABNORMAL HIGH (ref 0.0–2.0)
Bicarbonate: 12.8 mmol/L — ABNORMAL LOW (ref 20.0–28.0)
O2 Saturation: 53.5 %
Patient temperature: 98.6
pCO2, Ven: 32.8 mmHg — ABNORMAL LOW (ref 44.0–60.0)
pH, Ven: 7.217 — ABNORMAL LOW (ref 7.250–7.430)
pO2, Ven: <31 mmHg — CL (ref 32.0–45.0)

## 2019-06-20 LAB — TROPONIN I (HIGH SENSITIVITY)
Troponin I (High Sensitivity): 36 ng/L — ABNORMAL HIGH (ref <18)
Troponin I (High Sensitivity): 64 ng/L — ABNORMAL HIGH (ref <18)

## 2019-06-20 LAB — PROTEIN / CREATININE RATIO, URINE
Creatinine, Urine: 60.77 mg/dL
Protein Creatinine Ratio: 1.14 mg/mg{Cre} — ABNORMAL HIGH (ref 0.00–0.15)
Total Protein, Urine: 69 mg/dL

## 2019-06-20 LAB — BASIC METABOLIC PANEL
Anion gap: 16 — ABNORMAL HIGH (ref 5–15)
BUN: 163 mg/dL — ABNORMAL HIGH (ref 8–23)
CO2: 16 mmol/L — ABNORMAL LOW (ref 22–32)
Calcium: 12.5 mg/dL — ABNORMAL HIGH (ref 8.9–10.3)
Chloride: 105 mmol/L (ref 98–111)
Creatinine, Ser: 5.27 mg/dL — ABNORMAL HIGH (ref 0.44–1.00)
GFR calc Af Amer: 8 mL/min — ABNORMAL LOW (ref >60)
GFR calc non Af Amer: 7 mL/min — ABNORMAL LOW (ref >60)
Glucose, Bld: 111 mg/dL — ABNORMAL HIGH (ref 70–99)
Potassium: 6.3 mmol/L (ref 3.5–5.1)
Sodium: 137 mmol/L (ref 135–145)

## 2019-06-20 LAB — MAGNESIUM
Magnesium: 2.6 mg/dL — ABNORMAL HIGH (ref 1.7–2.4)
Magnesium: 2 mg/dL (ref 1.7–2.4)

## 2019-06-20 LAB — RAPID HIV SCREEN (HIV 1/2 AB+AG)
HIV 1/2 Antibodies: NONREACTIVE
HIV-1 P24 Antigen - HIV24: NONREACTIVE

## 2019-06-20 LAB — ACETAMINOPHEN LEVEL: Acetaminophen (Tylenol), Serum: <10 ug/mL — ABNORMAL LOW (ref 10–30)

## 2019-06-20 LAB — ECHOCARDIOGRAM COMPLETE: Weight: 2345.69 oz

## 2019-06-20 LAB — CREATININE, URINE, RANDOM: Creatinine, Urine: 62.88 mg/dL

## 2019-06-20 LAB — CK TOTAL AND CKMB (NOT AT ARMC)
CK, MB: 9.8 ng/mL — ABNORMAL HIGH (ref 0.5–5.0)
Relative Index: 3.2 — ABNORMAL HIGH (ref 0.0–2.5)
Total CK: 309 U/L — ABNORMAL HIGH (ref 38–234)

## 2019-06-20 LAB — SARS CORONAVIRUS 2 (TAT 6-24 HRS): SARS Coronavirus 2: NEGATIVE

## 2019-06-20 LAB — CBG MONITORING, ED
Glucose-Capillary: 114 mg/dL — ABNORMAL HIGH (ref 70–99)
Glucose-Capillary: 94 mg/dL (ref 70–99)

## 2019-06-20 LAB — PHOSPHORUS: Phosphorus: 4.4 mg/dL (ref 2.5–4.6)

## 2019-06-20 LAB — SODIUM, URINE, RANDOM: Sodium, Ur: 57 mmol/L

## 2019-06-20 LAB — CK: Total CK: 359 U/L — ABNORMAL HIGH (ref 38–234)

## 2019-06-20 LAB — LACTIC ACID, PLASMA: Lactic Acid, Venous: 1.4 mmol/L (ref 0.5–1.9)

## 2019-06-20 LAB — GLUCOSE, CAPILLARY: Glucose-Capillary: 146 mg/dL — ABNORMAL HIGH (ref 70–99)

## 2019-06-20 LAB — MRSA PCR SCREENING: MRSA by PCR: NEGATIVE

## 2019-06-20 LAB — BRAIN NATRIURETIC PEPTIDE: B Natriuretic Peptide: 144.3 pg/mL — ABNORMAL HIGH (ref 0.0–100.0)

## 2019-06-20 LAB — TSH: TSH: 1.61 u[IU]/mL (ref 0.350–4.500)

## 2019-06-20 MED ORDER — AMIODARONE IV BOLUS ONLY 150 MG/100ML
INTRAVENOUS | Status: AC | PRN
  Administered 2019-06-20: 150 mg via INTRAVENOUS

## 2019-06-20 MED ORDER — ENSURE ENLIVE PO LIQD
237.0000 mL | Freq: Two times a day (BID) | ORAL | Status: DC
  Administered 2019-06-22 – 2019-06-23 (×3): 237 mL via ORAL

## 2019-06-20 MED ORDER — DOCUSATE SODIUM 100 MG PO CAPS
100.0000 mg | ORAL_CAPSULE | Freq: Two times a day (BID) | ORAL | Status: DC
  Administered 2019-06-20 – 2019-06-24 (×9): 100 mg via ORAL
  Filled 2019-06-20 (×9): qty 1

## 2019-06-20 MED ORDER — LIDOCAINE-PRILOCAINE 2.5-2.5 % EX CREA
1.0000 | TOPICAL_CREAM | CUTANEOUS | Status: DC | PRN
  Filled 2019-06-20: qty 5

## 2019-06-20 MED ORDER — CLOPIDOGREL BISULFATE 75 MG PO TABS
75.0000 mg | ORAL_TABLET | Freq: Every day | ORAL | Status: DC
  Administered 2019-06-20 – 2019-06-22 (×3): 75 mg via ORAL
  Filled 2019-06-20 (×3): qty 1

## 2019-06-20 MED ORDER — ALBUTEROL SULFATE (2.5 MG/3ML) 0.083% IN NEBU
5.0000 mg | INHALATION_SOLUTION | Freq: Once | RESPIRATORY_TRACT | Status: AC
  Administered 2019-06-20: 5 mg via RESPIRATORY_TRACT
  Filled 2019-06-20: qty 6

## 2019-06-20 MED ORDER — LIDOCAINE HCL (PF) 1 % IJ SOLN: 5.0000 mL | INTRAMUSCULAR | Status: DC | PRN

## 2019-06-20 MED ORDER — LATANOPROST 0.005 % OP SOLN
1.0000 [drp] | Freq: Every day | OPHTHALMIC | Status: DC
  Administered 2019-06-21 – 2019-06-23 (×3): 1 [drp] via OPHTHALMIC
  Filled 2019-06-20 (×2): qty 2.5

## 2019-06-20 MED ORDER — SODIUM CHLORIDE 0.9% FLUSH
3.0000 mL | Freq: Two times a day (BID) | INTRAVENOUS | Status: DC
  Administered 2019-06-20 – 2019-06-24 (×8): 3 mL via INTRAVENOUS

## 2019-06-20 MED ORDER — SODIUM BICARBONATE 8.4 % IV SOLN
INTRAVENOUS | Status: AC | PRN
  Administered 2019-06-20: 50 meq via INTRAVENOUS

## 2019-06-20 MED ORDER — CALCIUM GLUCONATE 10 % IV SOLN: 2.0000 g | Freq: Once | INTRAVENOUS | Status: DC

## 2019-06-20 MED ORDER — SODIUM CHLORIDE 0.9 % IV SOLN: INTRAVENOUS | Status: DC

## 2019-06-20 MED ORDER — PROPRANOLOL HCL 10 MG PO TABS
10.0000 mg | ORAL_TABLET | Freq: Two times a day (BID) | ORAL | Status: DC
  Administered 2019-06-20 – 2019-06-24 (×7): 10 mg via ORAL
  Filled 2019-06-20 (×7): qty 1

## 2019-06-20 MED ORDER — SODIUM CHLORIDE 0.9 % IV BOLUS
500.0000 mL | Freq: Once | INTRAVENOUS | Status: AC
  Administered 2019-06-20: 500 mL via INTRAVENOUS

## 2019-06-20 MED ORDER — ADULT MULTIVITAMIN W/MINERALS CH
1.0000 | ORAL_TABLET | Freq: Every day | ORAL | Status: DC
  Administered 2019-06-20 – 2019-06-24 (×5): 1 via ORAL
  Filled 2019-06-20 (×5): qty 1

## 2019-06-20 MED ORDER — CALCIUM GLUCONATE-NACL 1-0.675 GM/50ML-% IV SOLN: 1.0000 g | Freq: Once | INTRAVENOUS | Status: DC

## 2019-06-20 MED ORDER — SODIUM ZIRCONIUM CYCLOSILICATE 10 G PO PACK
10.0000 g | PACK | Freq: Every day | ORAL | Status: DC
  Filled 2019-06-20: qty 1

## 2019-06-20 MED ORDER — SODIUM CHLORIDE 0.9 % IV SOLN
1.0000 g | INTRAVENOUS | Status: DC
  Administered 2019-06-20 – 2019-06-22 (×3): 1 g via INTRAVENOUS
  Filled 2019-06-20 (×3): qty 10

## 2019-06-20 MED ORDER — ALTEPLASE 2 MG IJ SOLR
2.0000 mg | Freq: Once | INTRAMUSCULAR | Status: DC | PRN
  Filled 2019-06-20: qty 2

## 2019-06-20 MED ORDER — ONDANSETRON HCL 4 MG PO TABS: 4.0000 mg | ORAL_TABLET | Freq: Four times a day (QID) | ORAL | Status: DC | PRN

## 2019-06-20 MED ORDER — CALCIUM CHLORIDE 10 % IV SOLN
INTRAVENOUS | Status: AC | PRN
  Administered 2019-06-20: 1 g via INTRAVENOUS

## 2019-06-20 MED ORDER — HEPARIN SODIUM (PORCINE) 1000 UNIT/ML IJ SOLN
INTRAMUSCULAR | Status: AC
  Filled 2019-06-20: qty 3

## 2019-06-20 MED ORDER — SODIUM BICARBONATE-DEXTROSE 150-5 MEQ/L-% IV SOLN
150.0000 meq | INTRAVENOUS | Status: AC
  Administered 2019-06-20 (×2): 150 meq via INTRAVENOUS
  Filled 2019-06-20 (×4): qty 1000

## 2019-06-20 MED ORDER — SODIUM ZIRCONIUM CYCLOSILICATE 10 G PO PACK
10.0000 g | PACK | Freq: Three times a day (TID) | ORAL | Status: DC
  Administered 2019-06-20: 10 g via ORAL
  Filled 2019-06-20 (×2): qty 1

## 2019-06-20 MED ORDER — SODIUM CHLORIDE 0.9 % IV SOLN: 100.0000 mL | INTRAVENOUS | Status: DC | PRN

## 2019-06-20 MED ORDER — ETOMIDATE 2 MG/ML IV SOLN
INTRAVENOUS | Status: AC | PRN
  Administered 2019-06-20: 5 mg via INTRAVENOUS

## 2019-06-20 MED ORDER — ATORVASTATIN CALCIUM 40 MG PO TABS
40.0000 mg | ORAL_TABLET | Freq: Every day | ORAL | Status: DC
  Administered 2019-06-20 – 2019-06-23 (×4): 40 mg via ORAL
  Filled 2019-06-20 (×4): qty 1

## 2019-06-20 MED ORDER — HEPARIN SODIUM (PORCINE) 1000 UNIT/ML DIALYSIS
1000.0000 [IU] | INTRAMUSCULAR | Status: DC | PRN
  Filled 2019-06-20 (×5): qty 1

## 2019-06-20 MED ORDER — CHLORHEXIDINE GLUCONATE CLOTH 2 % EX PADS
6.0000 | MEDICATED_PAD | Freq: Every day | CUTANEOUS | Status: DC
  Administered 2019-06-22: 6 via TOPICAL

## 2019-06-20 MED ORDER — ALBUTEROL SULFATE (2.5 MG/3ML) 0.083% IN NEBU: 2.5000 mg | INHALATION_SOLUTION | RESPIRATORY_TRACT | Status: DC | PRN

## 2019-06-20 MED ORDER — PENTAFLUOROPROP-TETRAFLUOROETH EX AERO
1.0000 "application " | INHALATION_SPRAY | CUTANEOUS | Status: DC | PRN
  Filled 2019-06-20: qty 30

## 2019-06-20 MED ORDER — ONDANSETRON HCL 4 MG/2ML IJ SOLN
4.0000 mg | Freq: Four times a day (QID) | INTRAMUSCULAR | Status: DC | PRN
  Administered 2019-06-21 – 2019-06-22 (×2): 4 mg via INTRAVENOUS
  Filled 2019-06-20 (×2): qty 2

## 2019-06-20 MED FILL — Medication: Qty: 1 | Status: AC

## 2019-06-20 NOTE — Progress Notes (Signed)
Initial Nutrition Assessment  DOCUMENTATION CODES:   Non-severe (moderate) malnutrition in context of chronic illness  INTERVENTION:    Ensure Enlive po BID, each supplement provides 350 kcal and 20 grams of protein  MVI daily   NUTRITION DIAGNOSIS:   Moderate Malnutrition related to chronic illness(CHF) as evidenced by energy intake < 75% for > or equal to 1 month, mild fat depletion, severe muscle depletion.  GOAL:   Patient will meet greater than or equal to 90% of their needs  MONITOR:   PO intake, Supplement acceptance, Weight trends, Labs, I & O's  REASON FOR ASSESSMENT:   Consult Assessment of nutrition requirement/status  ASSESSMENT:   Patient with PMH significant for GERD, HTN, MI s/p stenting, and CHF. Presents this admission with AKI, hyperkalemia, and possible UTI.   4/19- emergent HD  Spoke with daughter at beside. Reports intake has declined over the last 3-4 weeks due to "kidney issues." States during this time pt would eat 3 small meals daily that consisted of B- eggs, bacon L- 1/2 [employer] sandwich D- meat with vegetable. Reports over the last two weeks she started skipping meals. Tries to watch salt intake and uses [employer]. Discouraged use of this as it contains potassium chloride. She does not use supplementation. Discussed the importance of protein intake for preservation of lean body mass. Willing to try Ensure.   Endorses a UBW of 135 lb and denies weight loss. States she weighs herself daily and writes them in a log. Records show weight has remained stable. Suspect fluid accumulation is masking dry wt loss.   Drips: NS @ 50 ml/hr, sodium bicarb Medications: colace, lokelma Labs: Cr 3.36- up from yesterday   NUTRITION - FOCUSED PHYSICAL EXAM:    Most Recent Value  Orbital Region  Mild depletion  Upper Arm Region  Moderate depletion  Thoracic and Lumbar Region  Unable to assess  Buccal Region  Mild depletion  Temple Region  Moderate  depletion  Clavicle Bone Region  Severe depletion  Clavicle and Acromion Bone Region  Severe depletion  Scapular Bone Region  Unable to assess  Dorsal Hand  Mild depletion  Patellar Region  Mild depletion  Anterior Thigh Region  Mild depletion  Posterior Calf Region  Mild depletion  Edema (RD Assessment)  Moderate  Hair  Reviewed  Eyes  Reviewed  Mouth  Reviewed  Skin  Reviewed  Nails  Reviewed     Diet Order:   Diet Order            Diet Heart Room service appropriate? Yes; Fluid consistency: Thin  Diet effective now              EDUCATION NEEDS:   Education needs have been addressed  Skin:  Skin Assessment: Reviewed RN Assessment  Last BM:  PTA  Height:   Ht Readings from Last 1 Encounters:  06/15/19 5\' 2"  (1.575 m)    Weight:   Wt Readings from Last 1 Encounters:  06/20/19 66.5 kg    BMI:  Body mass index is 26.81 kg/m.  Estimated Nutritional Needs:   Kcal:  1700-1900 kcal  Protein:  85-100 grams  Fluid:  >/= 1.7 L/day   06/22/19 RD, LDN Clinical Nutrition Pager listed in AMION

## 2019-06-20 NOTE — Sedation Documentation (Signed)
Shock delivered at 150J per Dr. Manus Gunning

## 2019-06-20 NOTE — Progress Notes (Signed)
Pt continuousoly reporting feeling like she had voided this shift, no output in suction cannister. Bladder scan showed 508. Per MD okay to straight cath. Straight cath out.

## 2019-06-20 NOTE — Significant Event (Addendum)
Rapid Response Event Note  Overview: Called d/t pt c/o chest pain. Pt just arrived to HD via [facility]. Pt admitted to [facility] ED for fatigue and confusion. Labs revealed AKI, K-7.5, and possible UTI. Pt went into a wide complex tachycardia while in the ED requiring cardioversion  Initial Focused Assessment: Pt laying in bed, alert and oriented, in no distress, c/o 8/10 epigastric pain. Pt points to upper abdomen, lower chest when asked where her pain is. Pt says that she is having indigestion. She says she has this at home and that if feels the exact same way as it does at home. She says she usually drinks a cup of water at home and then it goes away. Skin warm and dry. T-97.5, HR-99(NSR), BP-93/58, RR-16, SpO2-97% on RA.  Interventions: EKG-NSR with sinus arrhythmia, inferior infarct, age undetermined.  Ice chips Plan of Care (if not transferred): This seems consistent with indigestion. EKG-NSR. Trop drawn at 0300 was only 36. Pt in no distress. Give ice chips. If pain doesn't get better please notify NP.  0530-Pt resting, says her pain is getting better, now a 4/10.  Event Summary:  Bruna Potter, NP notified at 0520 Called: 0435 Arrived: 0442 Ended: 0530  Terrilyn Saver

## 2019-06-20 NOTE — Progress Notes (Signed)
[facility] Progress Note    Assessment/ Plan:   1. AKI  Patient with CKD 3a at baseline. CTAP negative for obstruction. U micro with hylaine casts, mucus, >50 bacteria.  Likely intrinsic cause of acute renal failure secondary to NSAIDs, Lasix, home K supplementation in combination with dehydration.  Improvement of creatinine to 3.27 from 5.85 on arrival to [facility] ED. Transferred to [facility] for emergency dialysis. Temporary cath will stay in place pending serial labs and improvement.   Monitor RFP q 4 hrs today   Strict I's and O's  Follow-up urine culture  Hold home lasix   2. Hyperkalemia  Prsesnted to ED with K 7.5.  She received fluid bolus, Lokelma, insulin, bicarb, calcium gluconate, calcium chloride. Wide complex tachycardia requiring cardioversion x 1. Femoral line was placed for emergency dialysis early this morning. Postdialysis labs show potassium of 4.5, improvement of creatinine to 3.27.    Monitor RFP q 4 hrs today.  Decrease Lokelma to once daily, can continue bicarb drip  3.  Elevated UPC EPC on arrival 1.14. Urine protein 100 on U micro. No ace/arb on home med list.  Work up for [facility] pending   F/u labs  4. Wide complex tachycardia   Continue to monitor electrolytes  5. A fib on eliquis/CHF/Essential HTN/CAD 111/71 this morning. No SOB. Last LVEF 01/2018 50-55%..   Continue telemetry  Hold Eliquis, lasix, K supplementation  Continue home plavix, atorvastatin, propranalol   Rest per primary    6. Closed compression fracture L4  Osteoporosis  Dexa in 2003 osteoporotic with high fracture risk. Currently takes calcium supplementation  Avoid NSAIDs for pain   Rest per primary   Subjective:   Feeling better this morning.  Patient reports that she is very tired.   Objective:   BP 111/64 (BP Location: Right Arm)   Pulse 95   Temp (!) 97.5 F (36.4 C) (Oral)   Resp 14   Wt 66.5 kg   SpO2 96%   BMI 26.81 kg/m   Intake/Output Summary  (Last 24 hours) at 06/20/2019 0831 Last data filed at 06/20/2019 0654 Gross per 24 hour  Intake 653 ml  Output 0 ml  Net 653 ml   Weight change:   Physical Exam: Gen: Ill-appearing, nontoxic in no acute distress CVS: Irregularly irregular, no BLEE Resp: CTAB Abd: Positive bowel sounds.  No TTP. Ext: Warm and moving spontaneously.  Imaging: CT ABDOMEN PELVIS WO CONTRAST  Result Date: 06/20/2019 CLINICAL DATA:  Lower back pain.  Pancreatitis suspected EXAM: CT ABDOMEN AND PELVIS WITHOUT CONTRAST TECHNIQUE: Multidetector CT imaging of the abdomen and pelvis was performed following the standard protocol without IV contrast. COMPARISON:  None. FINDINGS: Lower chest: Densely calcified visualized coronary arteries. Lung bases clear. No effusions. Hepatobiliary: No focal hepatic abnormality. Gallbladder unremarkable. Pancreas: No focal abnormality or ductal dilatation. Spleen: No focal abnormality.  Normal size. Adrenals/Urinary Tract: 4.6 cm cyst in the midpole of the left kidney. No hydronephrosis. Adrenal glands and urinary bladder unremarkable. Stomach/Bowel: Diffuse colonic diverticulosis, most pronounced in the sigmoid colon. No active diverticulitis. No evidence of bowel obstruction. Vascular/Lymphatic: Aortic atherosclerosis. No enlarged abdominal or pelvic lymph nodes. Reproductive: Uterus and adnexa unremarkable.  No mass. Other: No free fluid or free air. Musculoskeletal: Moderate compression fracture through the superior endplate of L4 appears acute. Diffuse degenerative disc and facet disease. IMPRESSION: Diffuse colonic diverticulosis.  No active diverticulitis. Aortic atherosclerosis. Moderate L4 compression fracture, which appears acute. Coronary artery disease. Electronically Signed   By:  Charlett Nose M.D.   On: 06/20/2019 00:05   DG Chest 2 View  Result Date: 06/19/2019 CLINICAL DATA:  Fatigue, generalized weakness. EXAM: CHEST - 2 VIEW COMPARISON:  Chest x-ray dated 02/20/2018.  FINDINGS: Heart size and mediastinal contours are stable. Lungs are clear. No pleural effusion is seen. Osseous structures about the chest are unremarkable. IMPRESSION: No active cardiopulmonary disease. No evidence of pneumonia or pulmonary edema. Electronically Signed   By: Bary Richard M.D.   On: 06/19/2019 20:22    Labs: BMET Recent Labs  Lab 06/19/19 2130 06/20/19 0125 06/20/19 0301 06/20/19 0442  NA 133* 134* 137  137 135  K 7.5* 6.2* 6.3*  6.3* 5.4*  CL 108 108 106  105 101  CO2 12*  --  16*  16* 17*  GLUCOSE 95 170* 113*  111* 174*  BUN 166* >130* 163*  163* 160*  CREATININE 5.85* 6.00* 5.57*  5.27* 5.40*  CALCIUM 9.5  --  12.7*  12.5* 12.2*  PHOS  --   --  7.1*  --    CBC Recent Labs  Lab 06/19/19 2009 06/20/19 0125 06/20/19 0301 06/20/19 0442  WBC 11.2*  --  8.0 12.6*  NEUTROABS 9.1*  --   --  10.1*  HGB 15.3* 12.9 14.2 13.8  HCT 46.9* 38.0 40.8 40.2  MCV 102.6*  --  99.3 98.0  PLT 269  --  227 249    Medications:    . Chlorhexidine Gluconate Cloth  6 each Topical Q0600  . heparin sodium (porcine)      . sodium zirconium cyclosilicate  10 g Oral TID    Genia Hotter, MD [facility] Family Medicine Resident, PGY2 06/20/2019, 8:31 AM

## 2019-06-20 NOTE — Consult Note (Signed)
[facility]  HISTORY AND PHYSICAL  Charlene Dickerson is an 86 y.o. female.    Chief Complaint: n/v, weakness  HPI: Pt is an 68F with HTN, Afib on Eliquis, h/o CAD, recent compression fracture p/w 2 days fatigue n/v. Found to have AKI, hyperkalemia, metabolic acidosis, and possible UTI.  Hemodynamically stable on initial eval, EKG with mildly peaked T waves.  AKI to 5.85 from 1.0 and K 7.5 initially, BUN 166, Bicarb 12. Got med management with bicarb, Ca, insulin/dextrose, lokelma but went into wide complex tachycardia without losing pulse and consciousness.  Was given more Ca and more bicarb, albuterol neb, amio bolus, no improvement in HR.  Cardioverted x 1 with return to SR.  Repeat K 6.2.    Making urine. CT scan didn't show any obstruction.  Had been taking K supps, Lasix, NSAIDs.  Has a new compression fx.  Spoke with dtr at bedside.  All interventions desired including dialysis.  Pt reports she has to urinate.  PMH: Past Medical History:  Diagnosis Date  . Diverticulosis of colon   . Essential tremor   . GERD (gastroesophageal reflux disease)    occasional (no meds)  . History of basal cell carcinoma excision 2012  . Hypertension   . Osteoporosis   . PONV (postoperative nausea and vomiting)   . Rectocele    symptomatic   PSH: Past Surgical History:  Procedure Laterality Date  . BREAST EXCISIONAL BIOPSY Left 1995  . BREAST EXCISIONAL BIOPSY Right 1988  . CARDIOVERSION N/A 04/16/2018   Procedure: CARDIOVERSION;  Surgeon: Parke Poisson, MD;  Location: [facility] ENDOSCOPY;  Service: Cardiovascular;  Laterality: N/A;  . CATARACT EXTRACTION W/ INTRAOCULAR LENS  IMPLANT, BILATERAL  right 09/ 2013;  left 10/ 2013  . COLONOSCOPY  last one 04/10/ 2012  . CORONARY STENT INTERVENTION N/A 02/18/2018   Procedure: CORONARY STENT INTERVENTION;  Surgeon: Runell Gess, MD;  Location: [facility] INVASIVE CV LAB;  Service: Cardiovascular;  Laterality: N/A;  . D & C HYSTEROSCOPY W/  RESECTION ENDOMETRIAL POLYP AND LESION  07-27-2007   dr Ambrose Mantle @[facility]   . LEFT HEART CATH AND CORONARY ANGIOGRAPHY N/A 02/18/2018   Procedure: LEFT HEART CATH AND CORONARY ANGIOGRAPHY;  Surgeon: 02/20/2018, MD;  Location: [facility] INVASIVE CV LAB;  Service: Cardiovascular;  Laterality: N/A;  . RECTOCELE REPAIR N/A 11/26/2017   Procedure: POSTERIOR REPAIR (RECTOCELE);  Surgeon: 11/28/2017, MD;  Location: [facility];  Service: Gynecology;  Laterality: N/A;  OUTPT IN BED  . TEMPORARY PACEMAKER N/A 02/18/2018   Procedure: TEMPORARY PACEMAKER;  Surgeon: 02/20/2018, MD;  Location: [facility] INVASIVE CV LAB;  Service: Cardiovascular;  Laterality: N/A;  . TUBAL LIGATION Bilateral yrs ago    Past Medical History:  Diagnosis Date  . Diverticulosis of colon   . Essential tremor   . GERD (gastroesophageal reflux disease)    occasional (no meds)  . History of basal cell carcinoma excision 2012  . Hypertension   . Osteoporosis   . PONV (postoperative nausea and vomiting)   . Rectocele    symptomatic    Medications: Reviewed in Epic and include K supps, Lasix, and Eliquis.  Was taking NSAIDs  ALLERGIES:   Allergies  Allergen Reactions  . Clindamycin/Lincomycin Diarrhea    "c-diff"    FAM HX: Family History  Problem Relation Age of Onset  . Cancer Sister        Breast  . Heart disease Sister   . Breast cancer Sister   .  Heart disease Father   . Cancer Daughter        breast  . Breast cancer Daughter   . Cancer Maternal Aunt        breast  . Breast cancer Maternal Aunt   . Cancer Maternal Grandmother        breast  . Breast cancer Maternal Grandmother     Social History:   reports that she quit smoking about 58 years ago. She has never used smokeless tobacco. She reports that she does not drink alcohol or use drugs.  ROS: ROS: all other systems reviewed and are negative except as per HPI  Blood pressure 121/62, pulse 96, temperature 97.6 F (36.4 C),  temperature source Oral, resp. rate 18, SpO2 100 %. PHYSICAL EXAM: Physical Exam  GEN lying in bed NAD HEENT dry MM NECK no JVD PULM clear anteriorly CV tachy and regular ABD soft EXT no LE edema NEURO AAO x 3 nonfocal SKIN no rashes or lesions MSK no effusions   Results for orders placed or performed during the hospital encounter of 06/19/19 (from the past 48 hour(s))  CBC with Differential/Platelet     Status: Abnormal   Collection Time: 06/19/19  8:09 PM  Result Value Ref Range   WBC 11.2 (H) 4.0 - 10.5 K/uL   RBC 4.57 3.87 - 5.11 MIL/uL   Hemoglobin 15.3 (H) 12.0 - 15.0 g/dL   HCT 51.8 (H) 84.1 - 66.0 %   MCV 102.6 (H) 80.0 - 100.0 fL   MCH 33.5 26.0 - 34.0 pg   MCHC 32.6 30.0 - 36.0 g/dL   RDW 63.0 16.0 - 10.9 %   Platelets 269 150 - 400 K/uL   nRBC 0.0 0.0 - 0.2 %   Neutrophils Relative % 81 %   Neutro Abs 9.1 (H) 1.7 - 7.7 K/uL   Lymphocytes Relative 8 %   Lymphs Abs 0.9 0.7 - 4.0 K/uL   Monocytes Relative 11 %   Monocytes Absolute 1.2 (H) 0.1 - 1.0 K/uL   Eosinophils Relative 0 %   Eosinophils Absolute 0.0 0.0 - 0.5 K/uL   Basophils Relative 0 %   Basophils Absolute 0.0 0.0 - 0.1 K/uL   Immature Granulocytes 0 %   Abs Immature Granulocytes 0.05 0.00 - 0.07 K/uL    Comment: Performed at [facility], [address] [place], [place] [address]  Lipase, blood     Status: Abnormal   Collection Time: 06/19/19  8:09 PM  Result Value Ref Range   Lipase 105 (H) 11 - 51 U/L    Comment: Performed at [facility], [address] [place], [place] [address]  Urinalysis, Routine w reflex microscopic     Status: Abnormal   Collection Time: 06/19/19  8:09 PM  Result Value Ref Range   Color, Urine YELLOW YELLOW   APPearance HAZY (A) CLEAR   Specific Gravity, Urine 1.011 1.005 - 1.030   pH 5.0 5.0 - 8.0   Glucose, UA NEGATIVE NEGATIVE mg/dL   Hgb urine dipstick SMALL (A) NEGATIVE   Bilirubin Urine NEGATIVE NEGATIVE   Ketones, ur  NEGATIVE NEGATIVE mg/dL   Protein, ur 254 (A) NEGATIVE mg/dL   Nitrite NEGATIVE NEGATIVE   Leukocytes,Ua LARGE (A) NEGATIVE   RBC / HPF 0-5 0 - 5 RBC/hpf   WBC, UA >50 (H) 0 - 5 WBC/hpf   Bacteria, UA FEW (A) NONE SEEN   Squamous Epithelial / LPF 0-5 0 - 5   Mucus PRESENT  Hyaline Casts, UA PRESENT     Comment: Performed at [facility], [address] [place], [place] [address]  Comprehensive metabolic panel     Status: Abnormal   Collection Time: 06/19/19  9:30 PM  Result Value Ref Range   Sodium 133 (L) 135 - 145 mmol/L   Potassium 7.5 (HH) 3.5 - 5.1 mmol/L    Comment: NO VISIBLE HEMOLYSIS CRITICAL RESULT CALLED TO, READ BACK BY AND VERIFIED WITH: J [facility] AT 2333 ON 06/19/2019 BY MOSLEY,J    Chloride 108 98 - 111 mmol/L   CO2 12 (L) 22 - 32 mmol/L   Glucose, Bld 95 70 - 99 mg/dL    Comment: Glucose reference range applies only to samples taken after fasting for at least 8 hours.   BUN 166 (H) 8 - 23 mg/dL    Comment: RESULTS CONFIRMED BY MANUAL DILUTION   Creatinine, Ser 5.85 (H) 0.44 - 1.00 mg/dL   Calcium 9.5 8.9 - 36.6 mg/dL   Total Protein 7.6 6.5 - 8.1 g/dL   Albumin 4.4 3.5 - 5.0 g/dL   AST 21 15 - 41 U/L   ALT 20 0 - 44 U/L   Alkaline Phosphatase 47 38 - 126 U/L   Total Bilirubin 0.6 0.3 - 1.2 mg/dL   GFR calc non Af Amer 6 (L) >60 mL/min   GFR calc Af Amer 7 (L) >60 mL/min   Anion gap 13 5 - 15    Comment: Performed at [facility], [address], [place], [place] [address]  CK     Status: Abnormal   Collection Time: 06/19/19  9:30 PM  Result Value Ref Range   Total CK 359 (H) 38 - 234 U/L    Comment: Performed at [facility], [address], [place], [place] [address]  Sodium, urine, random     Status: None   Collection Time: 06/19/19 11:56 PM  Result Value Ref Range   Sodium, Ur 57 mmol/L    Comment: Performed at [facility], [address] [place], [place] [address]   Creatinine, urine, random     Status: None   Collection Time: 06/19/19 11:56 PM  Result Value Ref Range   Creatinine, Urine 62.88 mg/dL    Comment: Performed at [facility], [address] [place], [place] [address]  Protein / creatinine ratio, urine     Status: Abnormal   Collection Time: 06/20/19 12:14 AM  Result Value Ref Range   Creatinine, Urine 60.77 mg/dL   Total Protein, Urine 69 mg/dL    Comment: NO NORMAL RANGE ESTABLISHED FOR THIS TEST   Protein Creatinine Ratio 1.14 (H) 0.00 - 0.15 mg/mg[Cre]    Comment: Performed at [facility], [address], [place], [place] [address]  CBG monitoring, ED     Status: None   Collection Time: 06/20/19 12:39 AM  Result Value Ref Range   Glucose-Capillary 94 70 - 99 mg/dL    Comment: Glucose reference range applies only to samples taken after fasting for at least 8 hours.  Blood gas, venous     Status: Abnormal   Collection Time: 06/20/19 12:49 AM  Result Value Ref Range   pH, Ven 7.217 (L) 7.250 - 7.430   pCO2, Ven 32.8 (L) 44.0 - 60.0 mmHg   pO2, Ven <31.0 (LL) 32.0 - 45.0 mmHg    Comment: CRITICAL RESULT CALLED TO, READ BACK BY AND VERIFIED WITH: J [facility] AT [id number]  06/20/2019 BY MOSLEY,J    Bicarbonate 12.8 (L) 20.0 - 28.0 mmol/L   Acid-base deficit 13.8 (H) 0.0 - 2.0 mmol/L   O2 Saturation 53.5 %   Patient temperature 98.6     Comment: Performed at [facility], [place] [address], [place], [place] [address]  Acetaminophen level     Status: Abnormal   Collection Time: 06/20/19 12:49 AM  Result Value Ref Range   Acetaminophen (Tylenol), Serum <10 (L) 10 - 30 ug/mL    Comment: (NOTE) Therapeutic concentrations vary significantly. A range of 10-30 ug/mL  may be an effective concentration for many patients. However, some  are best treated at concentrations outside of this range. Acetaminophen concentrations >150 ug/mL at 4 hours after ingestion  and >50 ug/mL at 12 hours after  ingestion are often associated with  toxic reactions. Performed at [facility], [place] [address], [place], [place] [address]   I-stat chem 8, ED (not at [facility] or [facility])     Status: Abnormal   Collection Time: 06/20/19  1:25 AM  Result Value Ref Range   Sodium 134 (L) 135 - 145 mmol/L   Potassium 6.2 (H) 3.5 - 5.1 mmol/L   Chloride 108 98 - 111 mmol/L   BUN >130 (H) 8 - 23 mg/dL   Creatinine, Ser 6.00 (H) 0.44 - 1.00 mg/dL   Glucose, Bld 170 (H) 70 - 99 mg/dL    Comment: Glucose reference range applies only to samples taken after fasting for at least 8 hours.   Calcium, Ion 1.70 (HH) 1.15 - 1.40 mmol/L   TCO2 18 (L) 22 - 32 mmol/L   Hemoglobin 12.9 12.0 - 15.0 g/dL   HCT 38.0 36.0 - 46.0 %   Comment NOTIFIED PHYSICIAN     CT ABDOMEN PELVIS WO CONTRAST  Result Date: 06/20/2019 CLINICAL DATA:  Lower back pain.  Pancreatitis suspected EXAM: CT ABDOMEN AND PELVIS WITHOUT CONTRAST TECHNIQUE: Multidetector CT imaging of the abdomen and pelvis was performed following the standard protocol without IV contrast. COMPARISON:  None. FINDINGS: Lower chest: Densely calcified visualized coronary arteries. Lung bases clear. No effusions. Hepatobiliary: No focal hepatic abnormality. Gallbladder unremarkable. Pancreas: No focal abnormality or ductal dilatation. Spleen: No focal abnormality.  Normal size. Adrenals/Urinary Tract: 4.6 cm cyst in the midpole of the left kidney. No hydronephrosis. Adrenal glands and urinary bladder unremarkable. Stomach/Bowel: Diffuse colonic diverticulosis, most pronounced in the sigmoid colon. No active diverticulitis. No evidence of bowel obstruction. Vascular/Lymphatic: Aortic atherosclerosis. No enlarged abdominal or pelvic lymph nodes. Reproductive: Uterus and adnexa unremarkable.  No mass. Other: No free fluid or free air. Musculoskeletal: Moderate compression fracture through the superior endplate of L4 appears acute. Diffuse degenerative disc and facet  disease. IMPRESSION: Diffuse colonic diverticulosis.  No active diverticulitis. Aortic atherosclerosis. Moderate L4 compression fracture, which appears acute. Coronary artery disease. Electronically Signed   By: Rolm Baptise M.D.   On: 06/20/2019 00:05   DG Chest 2 View  Result Date: 06/19/2019 CLINICAL DATA:  Fatigue, generalized weakness. EXAM: CHEST - 2 VIEW COMPARISON:  Chest x-ray dated 02/20/2018. FINDINGS: Heart size and mediastinal contours are stable. Lungs are clear. No pleural effusion is seen. Osseous structures about the chest are unremarkable. IMPRESSION: No active cardiopulmonary disease. No evidence of pneumonia or pulmonary edema. Electronically Signed   By: Franki Cabot M.D.   On: 06/19/2019 20:22    Assessment/Plan  1.  AKI: likely in the setting of poor PO intake and NSAIDs.  Needs emergent dialysis  with hyperkalemia--> Fem catheter placed by EDP, greatly appreciate assistance.  Workup including ANA, complements, UP/C, SPEP and light chains underway.  Urine culture pending.  Will reassess after HD.    2.  Hyperkalemia: K 7.5-->6.2 after aggressive medical management, high risk of rebound, emergent dialysis.  Orders written  3.  Wide complex tachycardia: in setting of hyperkalemia, didn't lose a pulse or consciousness, was cardioverted x 1.  Now in NSR. Trops and TTE ordered  4.  Afib On Eliquis: holding for now  5.  Dispo: to be brought via [facility] to HD unit for emergent HD and then has a bed at [facility], appreciate all coordination attempts  Kameren Pargas 06/20/2019, 1:55 AM

## 2019-06-20 NOTE — ED Notes (Signed)
Date and time results received: 06/20/19 0100 (use smartphrase ".now" to insert current time)  Test: P02 Critical Value: <31.0  Name of Provider Notified: Adela Glimpse, MD   Orders Received? Or Actions Taken?: Orders Received - See Orders for details   Results received by [facility]

## 2019-06-20 NOTE — Progress Notes (Signed)
PROGRESS NOTE    Charlene Dickerson  BHA:[id number] DOB: Dec 29, 1932 DOA: 06/19/2019 PCP: Martha Clan, MD   Brief Narrative: 86 year old female with history of GERD, hypertension, osteoporosis, PAF, CAD/MI s/p stent in 2019 December on Lasix, potassium supplementation at home, recently seeing orthopedics for low back pain and on Tylenol and Advil repeatedly throughout the day for past week, decreased p.o. intake presented with , fatigue x 1 wk.   In the ED, UA worrisome for UTI and lab with potassium 7.3, creatinine 5.8, peaked T waves, discussion nephrology status post temporizing measures.  IN ER patient went into wide tachy rhythm-received ca 2 amp,150 of Amiodarone,2 amps of bicarb Pt remained alert.She was given a synchronized shock and converted to sinus rhythm.Pt was briefly bagged Plan for pt to have emergent HD in night at [facility] and transferred to [facility] from [facility].  Subjective:  Appears ill, frail, on RA, weak, post HD.  Daughter at bedside. No urinary symptoms PTA. Overnight transferred from [facility] to [facility] for emergent dialysis, had episode of chest pain/indigestion sensation after transfer. Blood pressure 90s to 100, potassium improved to 5.4, bicarb 17, BUN/creatinine 160/5.4  Assessment & Plan:  Acute kidney injury W/ metabolic acidosis and hyperkalemia, uremia: Due to poor intake, NSAIDs for back pain, diuretics.  Catheter placed in ED, nephrology on board work-up in progress with ANA, complements, UP/C, SPEP, light chains, urine culture.status post emergent dialysis during night, K improved. Continue on bicarb IV fluids and monitor urine output renal function closely.  Avoid nephrotoxic medication.  Severe Hyperkalemia, status post emergent dialysis, now on [facility] per nephrology.  ?V.Tach s/p shock in ED, s/p temporizing measures/amiod, in the setting of hyperkalemia. Cardiology eval, TTE.  Possible UTI: On empiric ceftriaxone follow-up urine culture.  Paroxysmal atrial  fibrillation, on Eliquis and propanolol 10 mg twice daily at home.  Eliquis on hold due to AKI, cardiology on board defer anticoagulation to cardio  Closed compression fracture of L4 lumbar vertebra, initial encounter: Pain management, possible kyphoplasty consideration once medically stable.  CAD on plavix/statin at home  Dehydration: Continue IV fluid hydration.  Chronic diastolic CHF -currently dehydrated needing IV fluids.  Monitor  Essential hypertension: bo stable, monitor  DVT prophylaxis:Heparin Code Status: full  Family Communication: plan of care discussed with patient and daughter at bedside. Status is: Inpatient  Remains inpatient appropriate because:Persistent severe electrolyte disturbances and Inpatient level of care appropriate due to severity of illness   Dispo: The patient is from: Home              Anticipated d/c is to: SNF              Anticipated d/c date is: > 3 days              Patient currently is not medically stable to d/c. Nutrition: Diet Order            Diet Heart Room service appropriate? Yes; Fluid consistency: Thin  Diet effective now            Consultants:Nephro Procedures: HD -emergent Microbiology:see note  Medications: Scheduled Meds: . atorvastatin  40 mg Oral q1800  . Chlorhexidine Gluconate Cloth  6 each Topical Q0600  . clopidogrel  75 mg Oral Daily  . docusate sodium  100 mg Oral BID  . heparin sodium (porcine)      . latanoprost  1 drop Both Eyes QHS  . propranolol  10 mg Oral BID  . sodium chloride flush  3 mL Intravenous Q12H  . sodium zirconium cyclosilicate  10 g Oral TID   Continuous Infusions: . sodium chloride Stopped (06/20/19 0145)  . sodium chloride    . sodium chloride    . cefTRIAXone (ROCEPHIN)  IV    . sodium bicarbonate 150 mEq in dextrose 5% 1000 mL 150 mEq (06/20/19 0124)    Antimicrobials: Anti-infectives (From admission, onward)   Start     Dose/Rate Route Frequency Ordered Stop   06/20/19 2300   cefTRIAXone (ROCEPHIN) 1 g in sodium chloride 0.9 % 100 mL IVPB     1 g 200 mL/hr over 30 Minutes Intravenous Every 24 hours 06/20/19 0043     06/19/19 2330  cefTRIAXone (ROCEPHIN) 1 g in sodium chloride 0.9 % 100 mL IVPB     1 g 200 mL/hr over 30 Minutes Intravenous  Once 06/19/19 2329 06/20/19 0058       Objective: Vitals: Today's Vitals   06/20/19 0606 06/20/19 0630 06/20/19 0654 06/20/19 0831  BP: (!) 95/52 (!) 100/58 111/64 111/71  Pulse:  96 95 94  Resp: (!) 21 18 14 16   Temp:   (!) 97.5 F (36.4 C) 97.9 F (36.6 C)  TempSrc:   Oral Oral  SpO2: 98% 97% 96% 96%  Weight:      PainSc: 7   0-No pain 0-No pain    Intake/Output Summary (Last 24 hours) at 06/20/2019 1031 Last data filed at 06/20/2019 0654 Gross per 24 hour  Intake 653 ml  Output 0 ml  Net 653 ml   Filed Weights   06/20/19 0421  Weight: 66.5 kg   Weight change:    Intake/Output from previous day: 04/18 0701 - 04/19 0700 In: 653 [I.V.:3; IV Piggyback:650] Out: 0  Intake/Output this shift: No intake/output data recorded.  Examination:  General exam: AA, weak, frail,NAD, weak appearing. HEENT:Oral mucosa moist, Ear/Nose WNL grossly,dentition normal. Respiratory system: bilaterally clear,no wheezing or crackles,no use of accessory muscle, non tender. Cardiovascular system: S1 & S2 +, regular, No JVD. Gastrointestinal system: Abdomen soft, NT,ND, BS+. Nervous System:Alert, awake, moving extremities and grossly nonfocal Extremities: No edema, distal peripheral pulses palpable.  Skin: No rashes,no icterus. MSK: Normal muscle bulk,tone, power  Data Reviewed: I have personally reviewed following labs and imaging studies CBC: Recent Labs  Lab 06/19/19 2009 06/20/19 0125 06/20/19 0301 06/20/19 0442 06/20/19 0850  WBC 11.2*  --  8.0 12.6* 9.5  NEUTROABS 9.1*  --   --  10.1* 7.7  HGB 15.3* 12.9 14.2 13.8 12.8  HCT 46.9* 38.0 40.8 40.2 36.6  MCV 102.6*  --  99.3 98.0 94.8  PLT 269  --  227 249  081   Basic Metabolic Panel: Recent Labs  Lab 06/19/19 2130 06/20/19 0125 06/20/19 0301 06/20/19 0442 06/20/19 0850  NA 133* 134* 137  137 135 135  K 7.5* 6.2* 6.3*  6.3* 5.4* 4.5  CL 108 108 106  105 101 99  CO2 12*  --  16*  16* 17* 22  GLUCOSE 95 170* 113*  111* 174* 199*  BUN 166* >130* 163*  163* 160* 80*  CREATININE 5.85* 6.00* 5.57*  5.27* 5.40* 3.27*  CALCIUM 9.5  --  12.7*  12.5* 12.2* 10.0  MG  --   --   --  2.6* 2.0  PHOS  --   --  7.1*  --  4.4   GFR: Estimated Creatinine Clearance: 11.5 mL/min (A) (by C-G formula based on SCr of 3.27 mg/dL (H)). Liver Function  Tests: Recent Labs  Lab 06/19/19 2130 06/20/19 0301 06/20/19 0442 06/20/19 0850  AST 21  --  22 21  ALT 20  --  21 20  ALKPHOS 47  --  44 43  BILITOT 0.6  --  0.8 1.2  PROT 7.6  --  6.6 6.1*  ALBUMIN 4.4 4.0 3.6 3.3*   Recent Labs  Lab 06/19/19 2009  LIPASE 105*   No results for input(s): AMMONIA in the last 168 hours. Coagulation Profile: No results for input(s): INR, PROTIME in the last 168 hours. Cardiac Enzymes: Recent Labs  Lab 06/19/19 2130 06/20/19 0442  CKTOTAL 359* 309*  CKMB  --  9.8*   BNP (last 3 results) Recent Labs    07/08/18 1300 07/20/18 1345  PROBNP 2,604* 2,553*   HbA1C: No results for input(s): HGBA1C in the last 72 hours. CBG: Recent Labs  Lab 06/20/19 0039 06/20/19 0308  GLUCAP 94 114*   Lipid Profile: No results for input(s): CHOL, HDL, LDLCALC, TRIG, CHOLHDL, LDLDIRECT in the last 72 hours. Thyroid Function Tests: Recent Labs    06/20/19 0850  TSH 1.610   Anemia Panel: No results for input(s): VITAMINB12, FOLATE, FERRITIN, TIBC, IRON, RETICCTPCT in the last 72 hours. Sepsis Labs: Recent Labs  Lab 06/20/19 0301  LATICACIDVEN 1.4    No results found for this or any previous visit (from the past 240 hour(s)).    Radiology Studies: CT ABDOMEN PELVIS WO CONTRAST  Result Date: 06/20/2019 CLINICAL DATA:  Lower back pain.   Pancreatitis suspected EXAM: CT ABDOMEN AND PELVIS WITHOUT CONTRAST TECHNIQUE: Multidetector CT imaging of the abdomen and pelvis was performed following the standard protocol without IV contrast. COMPARISON:  None. FINDINGS: Lower chest: Densely calcified visualized coronary arteries. Lung bases clear. No effusions. Hepatobiliary: No focal hepatic abnormality. Gallbladder unremarkable. Pancreas: No focal abnormality or ductal dilatation. Spleen: No focal abnormality.  Normal size. Adrenals/Urinary Tract: 4.6 cm cyst in the midpole of the left kidney. No hydronephrosis. Adrenal glands and urinary bladder unremarkable. Stomach/Bowel: Diffuse colonic diverticulosis, most pronounced in the sigmoid colon. No active diverticulitis. No evidence of bowel obstruction. Vascular/Lymphatic: Aortic atherosclerosis. No enlarged abdominal or pelvic lymph nodes. Reproductive: Uterus and adnexa unremarkable.  No mass. Other: No free fluid or free air. Musculoskeletal: Moderate compression fracture through the superior endplate of L4 appears acute. Diffuse degenerative disc and facet disease. IMPRESSION: Diffuse colonic diverticulosis.  No active diverticulitis. Aortic atherosclerosis. Moderate L4 compression fracture, which appears acute. Coronary artery disease. Electronically Signed   By: Charlett Nose M.D.   On: 06/20/2019 00:05   DG Chest 2 View  Result Date: 06/19/2019 CLINICAL DATA:  Fatigue, generalized weakness. EXAM: CHEST - 2 VIEW COMPARISON:  Chest x-ray dated 02/20/2018. FINDINGS: Heart size and mediastinal contours are stable. Lungs are clear. No pleural effusion is seen. Osseous structures about the chest are unremarkable. IMPRESSION: No active cardiopulmonary disease. No evidence of pneumonia or pulmonary edema. Electronically Signed   By: Bary Richard M.D.   On: 06/19/2019 20:22     LOS: 0 days   Time spent: More than 50% of that time was spent in counseling and/or coordination of care.  Lanae Boast,  MD [facility]  06/20/2019, 10:31 AM

## 2019-06-20 NOTE — ED Provider Notes (Signed)
Called to bedside emergently by nursing staff and Dr. Adela Glimpse.  Patient with tachycardia in the 200s.  This is wide-complex and 180-200 range.  Patient with known hyperkalemia 7.5. Appears regular. Likely due to hyperkalemia rather than ventricular tachycardia.  She has received calcium gluconate, bicarb, insulin, glucose and Lokelma. Dr. Adela Glimpse is discussing with critical care transferred to [facility].  She is maintaining her blood pressure and mental status.  Patient given emergently additional amps of calcium chloride, bicarb and albuterol. Daughter at bedside and patient herself confirm full code with all resuscitative measures including dialysis.   Discussed with Dr. Signe Colt of nephrology.  She is coming to evaluate patient.  She agrees with continued calcium and bicarb amps.  We will also give albuterol.  Wide-complex tachycardia in the 200s.  Patient does have a history of atrial fibrillation on Eliquis.  She is maintaining her blood pressure and mental status. We will also start amiodarone bolus with concern that this could represent atrial fibrillation or atrial flutter. No reported missed doses of Eliquis.  No changes in heart rate with above measures.  Repeat potassium is 6.2. Decision made to cardiovert patient out of his wide-complex tachycardia.  She did convert to a narrow complex tachycardia after 1 shock of 150 J after etomidate 5 mg.  Patient maintaining her heart rate and mental status.  Physical Exam  BP 128/87   Pulse (!) 107   Temp 97.6 F (36.4 C) (Oral)   Resp 17   SpO2 98%   Physical Exam  Alert, anxious Narrow complex tachycardia in 200s. Moves all extremities.  ED Course/Procedures   Clinical Course as of Jun 19 133  Sun Jun 19, 2019  2150 Labs show an elevated lipase as well as white blood cell count.  We will plan on CT scan   [JK]  2330 CXR without pna, chf   [JK]  Mon Jun 20, 2019  0001 Case discussed with Dr Signe Colt, nephrology.  Will  consult on pt.  Case discussed with Dr Adela Glimpse, hospitalist.   Will admit pt   [JK]    Clinical Course User Index [JK] Linwood Dibbles, MD    .Sedation  Date/Time: 06/20/2019 1:37 AM Performed by: Glynn Octave, MD Authorized by: Glynn Octave, MD   Consent:    Consent obtained:  Emergent situation   Consent given by:  Patient and healthcare agent   Risks discussed:  Allergic reaction, dysrhythmia, inadequate sedation, nausea, prolonged hypoxia resulting in organ damage, prolonged sedation necessitating reversal, respiratory compromise necessitating ventilatory assistance and intubation and vomiting   Alternatives discussed:  Analgesia without sedation, anxiolysis and regional anesthesia Universal protocol:    Procedure explained and questions answered to patient or proxy's satisfaction: yes     Relevant documents present and verified: yes     Test results available and properly labeled: yes     Imaging studies available: yes     Required blood products, implants, devices, and special equipment available: yes     Site/side marked: yes     Immediately prior to procedure a time out was called: yes     Patient identity confirmation method:  Verbally with patient Indications:    Procedure performed:  Cardioversion   Procedure necessitating sedation performed by:  Physician performing sedation Pre-sedation assessment:    Time since last food or drink:  Unknown   NPO status caution: unable to specify NPO status     ASA classification: class 4 - patient with severe systemic disease that is  a constant threat to life     Neck mobility: normal     Mouth opening:  3 or more finger widths   Thyromental distance:  4 finger widths   Mallampati score:  I - soft palate, uvula, fauces, pillars visible   Pre-sedation assessments completed and reviewed: airway patency, cardiovascular function, hydration status, mental status, nausea/vomiting, pain level, respiratory function and temperature      Pre-sedation assessment completed:  06/20/2019 1:37 AM Immediate pre-procedure details:    Reassessment: Patient reassessed immediately prior to procedure     Reviewed: vital signs, relevant labs/tests and NPO status     Verified: bag valve mask available, emergency equipment available, intubation equipment available, IV patency confirmed, oxygen available and suction available   Procedure details (see MAR for exact dosages):    Preoxygenation:  Nasal cannula   Sedation:  Etomidate   Intended level of sedation: deep   Intra-procedure monitoring:  Blood pressure monitoring, cardiac monitor, continuous pulse oximetry, frequent LOC assessments, frequent vital sign checks and continuous capnometry   Intra-procedure events: none     Intra-procedure management:  Airway repositioning and BVM ventilation   Total Provider sedation time (minutes):  15 Post-procedure details:    Post-sedation assessment completed:  06/20/2019 2:00 AM   Attendance: Constant attendance by certified staff until patient recovered     Recovery: Patient returned to pre-procedure baseline     Post-sedation assessments completed and reviewed: airway patency, cardiovascular function, hydration status, mental status, nausea/vomiting, pain level, respiratory function and temperature     Patient is stable for discharge or admission: yes     Patient tolerance:  Tolerated well, no immediate complications .Cardioversion  Date/Time: 06/20/2019 1:38 AM Performed by: Ezequiel Essex, MD Authorized by: Ezequiel Essex, MD   Consent:    Consent obtained:  Emergent situation   Consent given by:  Healthcare agent   Risks discussed:  Cutaneous burn, death, induced arrhythmia and pain   Alternatives discussed:  Rate-control medication and delayed treatment Pre-procedure details:    Cardioversion basis:  Emergent   Rhythm:  Ventricular tachycardia   Electrode placement:  Anterior-posterior Patient sedated: Yes. Refer to sedation  procedure documentation for details of sedation.  Attempt one:    Cardioversion mode:  Synchronous   Waveform:  Biphasic   Shock (Joules):  150   Shock outcome:  Conversion to normal sinus rhythm Post-procedure details:    Patient status:  Awake   Patient tolerance of procedure:  Tolerated well, no immediate complications .Central Line  Date/Time: 06/20/2019 2:48 AM Performed by: Ezequiel Essex, MD Authorized by: Ezequiel Essex, MD   Consent:    Consent obtained:  Emergent situation and written   Consent given by:  Patient and healthcare agent   Risks discussed:  Arterial puncture, bleeding, infection, incorrect placement, pneumothorax and nerve damage   Alternatives discussed:  No treatment Pre-procedure details:    Hand hygiene: Hand hygiene performed prior to insertion     Sterile barrier technique: All elements of maximal sterile technique followed     Skin preparation:  ChloraPrep   Skin preparation agent: Skin preparation agent completely dried prior to procedure   Anesthesia (see MAR for exact dosages):    Anesthesia method:  Local infiltration   Local anesthetic:  Lidocaine 1% w/o epi Procedure details:    Location:  R femoral   Patient position:  Trendelenburg   Procedural supplies: triple lumen dialysis catheter.   Catheter size:  14 Fr (trialysis)   Landmarks identified: yes  Ultrasound guidance: yes     Sterile ultrasound techniques: Sterile gel and sterile probe covers were used     Number of attempts:  1   Successful placement: yes   Post-procedure details:    Post-procedure:  Dressing applied and line sutured   Assessment:  Blood return through all ports   Patient tolerance of procedure:  Tolerated well, no immediate complications .Critical Care Performed by: Glynn Octave, MD Authorized by: Glynn Octave, MD   Critical care provider statement:    Critical care time (minutes):  120   Critical care was necessary to treat or prevent imminent  or life-threatening deterioration of the following conditions:  Circulatory failure, metabolic crisis, endocrine crisis and shock   Critical care was time spent personally by me on the following activities:  Discussions with consultants, evaluation of patient's response to treatment, examination of patient, ordering and performing treatments and interventions, ordering and review of laboratory studies, ordering and review of radiographic studies, pulse oximetry, re-evaluation of patient's condition, obtaining history from patient or surrogate and review of old charts    MDM    Patient did convert to a narrow complex tachycardia after cardioversion.  Repeat potassium is 6.2.  Dr. Signe Colt at bedside. Dr. Adela Glimpse discussion with patient  And daughter as well.  Will need to be transferred for dialysis.  No ICU beds at [facility].  Dialysis catheter placed by myself under ultrasound guidance.   Discussed with Dr. Bebe Shaggy at [facility], ED.  Will transfer for emergent dialysis.  Dr. Signe Colt feels patient needs to be transferred to [facility] for dialysis as soon as possible.  There is no ICU beds available at either [facility] or [facility].  Patient plan to go to dialysis directly from the ED and then be admitted to potentially the hospital service versus ICU afterwards. Patient has [facility] bed assigned per Dr. Adela Glimpse and will be able to bypass ED.      Glynn Octave, MD 06/20/19 0500

## 2019-06-20 NOTE — Progress Notes (Addendum)
RN called floor phlebotomist about missing ordered labs. Per phlebotomist, no labs are showing to be collected. She advised this RN to wait ~38mins to an hour to see if collection would cross over.     1315 RN reordered labs due to lab not seeing them to collect. Phlebotomy stated she could see the labs now and would be up shortly.

## 2019-06-20 NOTE — Progress Notes (Signed)
  Echocardiogram 2D Echocardiogram has been performed.  Charlene Dickerson 06/20/2019, 3:10 PM

## 2019-06-20 NOTE — Plan of Care (Signed)
  Problem: Education: Goal: Knowledge of General Education information will improve Description: Including pain rating scale, medication(s)/side effects and non-pharmacologic comfort measures Outcome: Progressing   Problem: Health Behavior/Discharge Planning: Goal: Ability to manage health-related needs will improve Outcome: Progressing   Problem: Clinical Measurements: Goal: Ability to maintain clinical measurements within normal limits will improve Outcome: Progressing   Problem: Activity: Goal: Risk for activity intolerance will decrease Outcome: Progressing   Problem: Nutrition: Goal: Adequate nutrition will be maintained Outcome: Progressing   Problem: Coping: Goal: Level of anxiety will decrease Outcome: Progressing   Problem: Elimination: Goal: Will not experience complications related to bowel motility Outcome: Progressing Goal: Will not experience complications related to urinary retention Outcome: Progressing   Problem: Pain Managment: Goal: General experience of comfort will improve Outcome: Progressing   

## 2019-06-20 NOTE — Progress Notes (Addendum)
31F with HTN, Afib on Eliquis, recent compression fracture p/w weakness, AKI, and possible UTI.  Labs: Cr 1.0 04/2019 --> 5.85 today, K 7.5, Bicarb 12. BUN 166, Ca 9.5 WBC 11.2, Hgb 15.3, Hct 46.9, plts 269  Ct scan without obstruction  No ACEi/ARB, takes K supps at home along with Lasix 40 mg BID.  No other nephrotoxic meds listed but ? If she was taking NSAIDs for back pain  Making urine.    Plan for now: - s/p 500 mL bolus - Calcium gluconate, insulin/ dextrose, and amp bicarb ordered by primary - I have added bicarb gtt, albuterol 5 mg neb, and Lokelma TID to start now - strict I/O - q 4 hr labs - will follow urine culture.  Have also added ANA, complements, and SPEP/ free light chains along with UP/C to workup - if urine culture negative, need to consider AIN as well  - too early to tell if biopsy will be needed  Full c/s note to follow   Bufford Buttner MD [facility] pgr [phone]

## 2019-06-20 NOTE — Progress Notes (Signed)
Pt was brought in via [facility] directly from [facility]. Pt arrived in a stretcher but needed a bed to dialyze as the [facility] team needed the stretcher for other patients. While waiting for bed form 2C to arrive pt began to c/o "indegestion" which later became pain 8/10 located mid chest with no radiation. Pt also became agitated. Writer contacted RR Team. Prior to arrival pt went into a fib and pt shocked. Writer also contacted Dr. Signe Colt to get permission to proceed with tx. MD agreed to begin tx. Tx iniated.

## 2019-06-20 NOTE — Consult Note (Addendum)
Cardiology Consultation:   Patient ID: ZULLY FRANE MRN: [id number]; DOB: [birth year]  Admit date: 06/19/2019 Date of Consult: 06/20/2019  Primary Care Provider: Martha Clan, MD Primary Cardiologist: Kristeen Miss, MD  Primary Electrophysiologist:  None    Patient Profile:   YOMAYRA TATE is a 86 y.o. female with a hx of hypertension, STEMI (2019), HL, GERD, PAF, diverticulosis who is being seen today for the evaluation of WCT at the request of Dr. Adela Glimpse.  History of Present Illness:   Ms. Cantrell is an 86 yo female with PMH noted above. She is followed by Dr. Elease Hashimoto as an outpatient. She presented with an inferior STEMI in 01/2018 with PCI/DES x2 to the RCA complicated by CHB. Echo during that admission showed normal EF. In Feb 2020 she developed atrial fibrillation, placed on Eliquis and underwent successful DCCV. Unfortunately had early return of Afib. Seen back in the office with Georgie Chard 05/2018 and decision was made to pursue rate control as she was asymptomatic. She was seen through a tele-health visit on 10/2018 with Tereso Newcomer and reported being in her usual state of health. Did report continuing to have swelling in her legs and ankles but had overall improved. Her home lasix dose was increased to 40mg  BID. Seen back in the office for follow up visit in the office with Dr. . Continued on her home medications without significant changes.    Presented to the ED on 4/18 with fatigue. Reported she had recently been seen by her orthopedic MD for left sided lower back and had been taking Tylenol/Advil. Also noted decreased oral intake. Said she had been having issues with lightheadedness. Family reported concern about whether she was taking her medications as prescribed. Daughter became concerned with her increasing back pain and LE edema.   In the ED at [facility] her labs showed Na+ 133, K+ 7.5, Cr 5.85, CK total 359, WBC 11.2. EKG showed SR 98bpm old anterior/inferior infarct,  mildly peaked T waves. While in the ED she developed a WCT and cardioverted x1. Had to be bagged brief;y, but did not lose a pulse. She was transferred to [facility] for emergent HD after a femoral HD cath was placed and given Lokelma. Seen by nephrology. K+ improved to 4.5 post HD, and Cr down to 3.27.    Past Medical History:  Diagnosis Date  . Diverticulosis of colon   . Essential tremor   . GERD (gastroesophageal reflux disease)    occasional (no meds)  . History of basal cell carcinoma excision 2012  . Hypertension   . Osteoporosis   . PONV (postoperative nausea and vomiting)   . Rectocele    symptomatic    Past Surgical History:  Procedure Laterality Date  . BREAST EXCISIONAL BIOPSY Left 1995  . BREAST EXCISIONAL BIOPSY Right 1988  . CARDIOVERSION N/A 04/16/2018   Procedure: CARDIOVERSION;  Surgeon: 04/18/2018, MD;  Location: [facility] ENDOSCOPY;  Service: Cardiovascular;  Laterality: N/A;  . CATARACT EXTRACTION W/ INTRAOCULAR LENS  IMPLANT, BILATERAL  right 09/ 2013;  left 10/ 2013  . COLONOSCOPY  last one 04/10/ 2012  . CORONARY STENT INTERVENTION N/A 02/18/2018   Procedure: CORONARY STENT INTERVENTION;  Surgeon: 02/20/2018, MD;  Location: [facility] INVASIVE CV LAB;  Service: Cardiovascular;  Laterality: N/A;  . D & C HYSTEROSCOPY W/ RESECTION ENDOMETRIAL POLYP AND LESION  07-27-2007   dr 07-29-2007 @[facility]   . LEFT HEART CATH AND CORONARY ANGIOGRAPHY N/A 02/18/2018   Procedure: LEFT HEART CATH AND  CORONARY ANGIOGRAPHY;  Surgeon: Runell Gess, MD;  Location: [facility] INVASIVE CV LAB;  Service: Cardiovascular;  Laterality: N/A;  . RECTOCELE REPAIR N/A 11/26/2017   Procedure: POSTERIOR REPAIR (RECTOCELE);  Surgeon: Tracey Harries, MD;  Location: [facility];  Service: Gynecology;  Laterality: N/A;  OUTPT IN BED  . TEMPORARY PACEMAKER N/A 02/18/2018   Procedure: TEMPORARY PACEMAKER;  Surgeon: Runell Gess, MD;  Location: [facility] INVASIVE CV LAB;  Service: Cardiovascular;   Laterality: N/A;  . TUBAL LIGATION Bilateral yrs ago     Home Medications:  Prior to Admission medications   Medication Sig Start Date End Date Taking? Authorizing Provider  acetaminophen (TYLENOL) 500 MG tablet Take 1,000 mg by mouth every 6 (six) hours as needed (pain.).     [provider]  ALPRAZolam Prudy Feeler) 0.5 MG tablet Take 0.25-0.5 tablets by mouth 2 (two) times daily as needed for anxiety.  03/05/18   [provider]  apixaban (ELIQUIS) 5 MG TABS tablet TAKE 1 TABLET(5 MG) BY MOUTH TWICE DAILY 06/13/19   Nahser, Deloris Ping, MD  atorvastatin (LIPITOR) 40 MG tablet TAKE 1 TABLET(40 MG) BY MOUTH DAILY AT 6 PM 04/26/19   Nahser, Deloris Ping, MD  calcium citrate (CALCITRATE - DOSED IN MG ELEMENTAL CALCIUM) 950 MG tablet Take 200 mg of elemental calcium by mouth daily.    [provider]  clopidogrel (PLAVIX) 75 MG tablet Take 1 tablet (75 mg total) by mouth daily. 06/09/19   Nahser, Deloris Ping, MD  famotidine (PEPCID AC) 10 MG tablet Take 10 mg by mouth as needed for heartburn or indigestion.    [provider]  fluticasone (FLONASE) 50 MCG/ACT nasal spray Place 1 spray into both nostrils 2 (two) times daily as needed for allergies or rhinitis.     [provider]  furosemide (LASIX) 20 MG tablet Take 2 tablets (40 mg total) by mouth 2 (two) times daily. 10/20/18 10/20/19  Tereso Newcomer T, PA-C  latanoprost (XALATAN) 0.005 % ophthalmic solution Place 1 drop into both eyes at bedtime.  04/17/11   [provider]  loratadine (CLARITIN) 10 MG tablet Take 10 mg by mouth daily.    [provider]  Multiple Vitamin (MULTIVITAMIN WITH MINERALS) TABS tablet Take 1 tablet by mouth daily.    [provider]  nitroGLYCERIN (NITROSTAT) 0.4 MG SL tablet Place 1 tablet (0.4 mg total) under the tongue every 5 (five) minutes as needed for chest pain. 02/24/18 04/25/19  Robbie Lis M, PA-C  pantoprazole (PROTONIX) 40 MG tablet TAKE 1 TABLET(40 MG)  BY MOUTH AT BEDTIME 03/28/19   Simmons, Avon Gully M, PA-C  potassium chloride (KLOR-CON) 10 MEQ tablet TAKE 2 TABLETS BY MOUTH TWICE DAILY 04/19/19   Nahser, Deloris Ping, MD  propranolol (INDERAL) 10 MG tablet Take 10 mg by mouth 2 (two) times daily. (2) 1 times daily     [provider]  simethicone (MYLICON) 80 MG chewable tablet Chew 160 mg by mouth every 6 (six) hours as needed for flatulence.    [provider]  Vitamin D, Ergocalciferol, (DRISDOL) 1.25 MG (50000 UT) CAPS capsule Take 50,000 Units by mouth every Wednesday.     [provider]    Inpatient Medications: Scheduled Meds: . atorvastatin  40 mg Oral q1800  . Chlorhexidine Gluconate Cloth  6 each Topical Q0600  . clopidogrel  75 mg Oral Daily  . docusate sodium  100 mg Oral BID  . heparin sodium (porcine)      .  latanoprost  1 drop Both Eyes QHS  . propranolol  10 mg Oral BID  . sodium chloride flush  3 mL Intravenous Q12H  . sodium zirconium cyclosilicate  10 g Oral TID   Continuous Infusions: . sodium chloride Stopped (06/20/19 0145)  . sodium chloride    . sodium chloride    . cefTRIAXone (ROCEPHIN)  IV    . sodium bicarbonate 150 mEq in dextrose 5% 1000 mL 150 mEq (06/20/19 0124)   PRN Meds: sodium chloride, sodium chloride, albuterol, alteplase, heparin, lidocaine (PF), lidocaine-prilocaine, ondansetron **OR** ondansetron (ZOFRAN) IV, pentafluoroprop-tetrafluoroeth  Allergies:    Allergies  Allergen Reactions  . Clindamycin/Lincomycin Diarrhea    "c-diff"    Social History:   Social History   Socioeconomic History  . Marital status: Widowed    Spouse name: Not on file  . Number of children: Not on file  . Years of education: Not on file  . Highest education level: Not on file  Occupational History  . Not on file  Tobacco Use  . Smoking status: Former Smoker    Quit date: 05/08/1961    Years since quitting: 58.1  . Smokeless tobacco: Never Used  Substance and Sexual Activity    . Alcohol use: No  . Drug use: Never  . Sexual activity: Not on file  Other Topics Concern  . Not on file  Social History Narrative  . Not on file   Social Determinants of Health   Financial Resource Strain:   . Difficulty of Paying Living Expenses:   Food Insecurity:   . Worried About Programme researcher, broadcasting/film/video in the Last Year:   . Barista in the Last Year:   Transportation Needs:   . Freight forwarder (Medical):   Marland Kitchen Lack of Transportation (Non-Medical):   Physical Activity:   . Days of Exercise per Week:   . Minutes of Exercise per Session:   Stress:   . Feeling of Stress :   Social Connections:   . Frequency of Communication with Friends and Family:   . Frequency of Social Gatherings with Friends and Family:   . Attends Religious Services:   . Active Member of Clubs or Organizations:   . Attends Banker Meetings:   Marland Kitchen Marital Status:   Intimate Partner Violence:   . Fear of Current or Ex-Partner:   . Emotionally Abused:   Marland Kitchen Physically Abused:   . Sexually Abused:     Family History:    Family History  Problem Relation Age of Onset  . Cancer Sister        Breast  . Heart disease Sister   . Breast cancer Sister   . Heart disease Father   . Cancer Daughter        breast  . Breast cancer Daughter   . Cancer Maternal Aunt        breast  . Breast cancer Maternal Aunt   . Cancer Maternal Grandmother        breast  . Breast cancer Maternal Grandmother      ROS:  Please see the history of present illness.   All other ROS reviewed and negative.     Physical Exam/Data:   Vitals:   06/20/19 0606 06/20/19 0630 06/20/19 0654 06/20/19 0831  BP: (!) 95/52 (!) 100/58 111/64 111/71  Pulse:  96 95 94  Resp: (!) 21 18 14 16   Temp:   (!) 97.5 F (36.4 C) 97.9 F (36.6 C)  TempSrc:   Oral Oral  SpO2: 98% 97% 96% 96%  Weight:        Intake/Output Summary (Last 24 hours) at 06/20/2019 1018 Last data filed at 06/20/2019 0654 Gross per 24 hour   Intake 653 ml  Output 0 ml  Net 653 ml   Last 3 Weights 06/20/2019 06/15/2019 06/01/2019  Weight (lbs) 146 lb 9.7 oz 151 lb 151 lb  Weight (kg) 66.5 kg 68.493 kg 68.493 kg     Body mass index is 26.81 kg/m.  General:  Well nourished, well developed, in no acute distress HEENT: normal Lymph: no adenopathy Neck: no JVD Endocrine:  No thryomegaly Vascular: No carotid bruits; FA pulses 2+ bilaterally without bruits  Cardiac:  normal S1, S2; RRR; no murmur  Lungs:  clear to auscultation bilaterally, no wheezing, rhonchi or rales  Abd: soft, nontender, no hepatomegaly  Ext: no edema Musculoskeletal:  No deformities, BUE and BLE strength normal and equal Skin: warm and dry  Neuro:  CNs 2-12 intact, no focal abnormalities noted Psych:  Normal affect   EKG:  The EKG was personally reviewed and demonstrates: SR-->WCT--> SR Telemetry:  Telemetry was personally reviewed and demonstrates:  SR with PVCs  Relevant CV Studies:  Echo: 02/19/2018  Study Conclusions   - Left ventricle: The cavity size was normal. Wall thickness was  normal. Systolic function was normal. The estimated ejection  fraction was in the range of 50% to 55%. There is hypokinesis of  the basalinferior myocardium. Doppler parameters are consistent  with high ventricular filling pressure.  - Ventricular septum: The contour showed diastolic flattening and  systolic flattening.  - Aortic valve: There was mild regurgitation.  - Mitral valve: Calcified annulus.  - Right ventricle: The cavity size was moderately dilated. Systolic  function was severely reduced.  - Right atrium: The atrium was severely dilated.  - Atrial septum: There was an atrial septal aneurysm.  - Pulmonary arteries: PA peak pressure: 39 mm Hg (S).   Impressions:   - Inferobasal hypokinesis with overall low normal LV systolic  function; mild AI; moderate RVE with severe RV dysfunction;  severe RAE; mild TR.   Laboratory  Data:  High Sensitivity Troponin:   Recent Labs  Lab 06/20/19 0301 06/20/19 0442  TROPONINIHS 36* 64*     Chemistry Recent Labs  Lab 06/20/19 0301 06/20/19 0442 06/20/19 0850  NA 137  137 135 135  K 6.3*  6.3* 5.4* 4.5  CL 106  105 101 99  CO2 16*  16* 17* 22  GLUCOSE 113*  111* 174* 199*  BUN 163*  163* 160* 80*  CREATININE 5.57*  5.27* 5.40* 3.27*  CALCIUM 12.7*  12.5* 12.2* 10.0  GFRNONAA 6*  7* 7* 12*  GFRAA 8*  8* 8* 14*  ANIONGAP 15  16* 17* 14    Recent Labs  Lab 06/19/19 2130 06/19/19 2130 06/20/19 0301 06/20/19 0442 06/20/19 0850  PROT 7.6  --   --  6.6 6.1*  ALBUMIN 4.4   < > 4.0 3.6 3.3*  AST 21  --   --  22 21  ALT 20  --   --  21 20  ALKPHOS 47  --   --  44 43  BILITOT 0.6  --   --  0.8 1.2   < > = values in this interval not displayed.   Hematology Recent Labs  Lab 06/20/19 0301 06/20/19 0442 06/20/19 0850  WBC 8.0 12.6* 9.5  RBC 4.11 4.10 3.86*  HGB 14.2 13.8 12.8  HCT 40.8 40.2 36.6  MCV 99.3 98.0 94.8  MCH 34.5* 33.7 33.2  MCHC 34.8 34.3 35.0  RDW 12.9 13.0 12.6  PLT 227 249 215   BNP Recent Labs  Lab 06/20/19 0528  BNP 144.3*    DDimer No results for input(s): DDIMER in the last 168 hours.   Radiology/Studies:  CT ABDOMEN PELVIS WO CONTRAST  Result Date: 06/20/2019 CLINICAL DATA:  Lower back pain.  Pancreatitis suspected EXAM: CT ABDOMEN AND PELVIS WITHOUT CONTRAST TECHNIQUE: Multidetector CT imaging of the abdomen and pelvis was performed following the standard protocol without IV contrast. COMPARISON:  None. FINDINGS: Lower chest: Densely calcified visualized coronary arteries. Lung bases clear. No effusions. Hepatobiliary: No focal hepatic abnormality. Gallbladder unremarkable. Pancreas: No focal abnormality or ductal dilatation. Spleen: No focal abnormality.  Normal size. Adrenals/Urinary Tract: 4.6 cm cyst in the midpole of the left kidney. No hydronephrosis. Adrenal glands and urinary bladder unremarkable.  Stomach/Bowel: Diffuse colonic diverticulosis, most pronounced in the sigmoid colon. No active diverticulitis. No evidence of bowel obstruction. Vascular/Lymphatic: Aortic atherosclerosis. No enlarged abdominal or pelvic lymph nodes. Reproductive: Uterus and adnexa unremarkable.  No mass. Other: No free fluid or free air. Musculoskeletal: Moderate compression fracture through the superior endplate of L4 appears acute. Diffuse degenerative disc and facet disease. IMPRESSION: Diffuse colonic diverticulosis.  No active diverticulitis. Aortic atherosclerosis. Moderate L4 compression fracture, which appears acute. Coronary artery disease. Electronically Signed   By: Rolm Baptise M.D.   On: 06/20/2019 00:05   DG Chest 2 View  Result Date: 06/19/2019 CLINICAL DATA:  Fatigue, generalized weakness. EXAM: CHEST - 2 VIEW COMPARISON:  Chest x-ray dated 02/20/2018. FINDINGS: Heart size and mediastinal contours are stable. Lungs are clear. No pleural effusion is seen. Osseous structures about the chest are unremarkable. IMPRESSION: No active cardiopulmonary disease. No evidence of pneumonia or pulmonary edema. Electronically Signed   By: Franki Cabot M.D.   On: 06/19/2019 20:22    Assessment and Plan:   JOANELL CRESSLER is a 86 y.o. female with a hx of hypertension, STEMI (2019), HL, GERD, PAF, diverticulosis who is being seen today for the evaluation of WCT at the request of Dr. Roel Cluck.  1. Wide Complex Tachycardia/VT: developed acutely while in the ED at [facility]. In the setting of K+ 7.5, and AKI with Cr 5.8. She was cardioverted x1 successfully, did not lose a pulse. Telemetry review shows SR with PVCs. Suspect her arrhythmia was related to her electrolyte imbalances. No chest pain prior to admission.  -- agree with echo to reevaluate EF  2. Hyperkalemia: daughter reports her lasix was being adjusted prior to admission and she was on K+ supplement as well. K+ 7.5>>4.5 today after emergent HD.   3. AKI: suspect 2/2  NSAID use bc of her lower back pain. Had femoral HD line placed in the ED. Cr 5.8>>3.5 after HD. Further management per nephrology  4. PAF: hx of the same. On Eliquis prior to admission. Currently in SR. Eliquis has been held. If renal function stabilizes would plan to restart Eliquis.   5. Inferior STEMI: back in 12/19 c/b CHB. Resolved and has been doing quite well since then. hsTn with low flat trend. EKG without ischemia. Remains on plavix, no ASA with the need for Eliquis.  -- follow up echo  For questions or updates, please contact [place] Please consult [email or web address] for contact info under   Signed, Reino Bellis, NP  06/20/2019 10:18 AM   Patient seen  and examined.  Agree with above documentation.  Ms. Soth is an 86 year old female with a history of CAD status post STEMI in 2019 with drug-eluting stent x2 to RCA complicated by complete heart block (did not require PPM), paroxysmal atrial fibrillation, hyperlipidemia who is being seen today to evaluate wide-complex tachycardia at the request of Dr. Adela Glimpse.  She presented to the ED yesterday with fatigue and lightheadedness.  She had been taking NSAIDs for back pain and not taking much p.o.  Labs in the ED notable for potassium 7.5, creatinine 5.9.  Initial EKG personally reviewed and shows sinus rhythm, rate 98, poor R wave progression, inferior Q waves.  Subsequent EKG from 12:56 AM shows wide-complex tachycardia, rate 196, precordial concordance consistent with ventricular tachycardia.  She did not become pulseless.  She underwent cardioversion x1 with successful restoration of normal sinus rhythm.  Nephrology was consulted and she underwent emergent hemodialysis.  Potassium is 4.4 this morning.  She denies any chest pain, dyspnea, lightheadedness, or syncope.  On exam, patient is alert, regular rate and rhythm, no murmurs, lungs CTAB, no LE edema.  Ventricular tachycardia likely due to severe hyperkalemia.  Hyperkalemia has been  treated, has normal potassium today following hemodialysis.  Would continue to monitor on telemetry.  Agree with checking TTE to evaluate for underlying structural heart disease.  Little Ishikawa, MD

## 2019-06-21 DIAGNOSIS — E440 Moderate protein-calorie malnutrition: Secondary | ICD-10-CM | POA: Insufficient documentation

## 2019-06-21 DIAGNOSIS — I480 Paroxysmal atrial fibrillation: Secondary | ICD-10-CM

## 2019-06-21 HISTORY — DX: Moderate protein-calorie malnutrition: E44.0

## 2019-06-21 LAB — URINALYSIS, ROUTINE W REFLEX MICROSCOPIC
Bilirubin Urine: NEGATIVE
Glucose, UA: NEGATIVE mg/dL
Ketones, ur: NEGATIVE mg/dL
Nitrite: NEGATIVE
Protein, ur: NEGATIVE mg/dL
Specific Gravity, Urine: 1.011 (ref 1.005–1.030)
WBC, UA: >50 WBC/hpf — ABNORMAL HIGH (ref 0–5)
pH: 7 (ref 5.0–8.0)

## 2019-06-21 LAB — RENAL FUNCTION PANEL
Albumin: 2.8 g/dL — ABNORMAL LOW (ref 3.5–5.0)
Anion gap: 12 (ref 5–15)
BUN: 71 mg/dL — ABNORMAL HIGH (ref 8–23)
CO2: 28 mmol/L (ref 22–32)
Calcium: 8.5 mg/dL — ABNORMAL LOW (ref 8.9–10.3)
Chloride: 100 mmol/L (ref 98–111)
Creatinine, Ser: 2.27 mg/dL — ABNORMAL HIGH (ref 0.44–1.00)
GFR calc Af Amer: 22 mL/min — ABNORMAL LOW (ref >60)
GFR calc non Af Amer: 19 mL/min — ABNORMAL LOW (ref >60)
Glucose, Bld: 121 mg/dL — ABNORMAL HIGH (ref 70–99)
Phosphorus: 3.3 mg/dL (ref 2.5–4.6)
Potassium: 3.8 mmol/L (ref 3.5–5.1)
Sodium: 140 mmol/L (ref 135–145)

## 2019-06-21 LAB — COMPREHENSIVE METABOLIC PANEL
ALT: 18 U/L (ref 0–44)
AST: 17 U/L (ref 15–41)
Albumin: 2.8 g/dL — ABNORMAL LOW (ref 3.5–5.0)
Alkaline Phosphatase: 35 U/L — ABNORMAL LOW (ref 38–126)
Anion gap: 7 (ref 5–15)
BUN: 65 mg/dL — ABNORMAL HIGH (ref 8–23)
CO2: 33 mmol/L — ABNORMAL HIGH (ref 22–32)
Calcium: 8.4 mg/dL — ABNORMAL LOW (ref 8.9–10.3)
Chloride: 100 mmol/L (ref 98–111)
Creatinine, Ser: 1.95 mg/dL — ABNORMAL HIGH (ref 0.44–1.00)
GFR calc Af Amer: 27 mL/min — ABNORMAL LOW (ref >60)
GFR calc non Af Amer: 23 mL/min — ABNORMAL LOW (ref >60)
Glucose, Bld: 112 mg/dL — ABNORMAL HIGH (ref 70–99)
Potassium: 3.5 mmol/L (ref 3.5–5.1)
Sodium: 140 mmol/L (ref 135–145)
Total Bilirubin: 0.9 mg/dL (ref 0.3–1.2)
Total Protein: 5.3 g/dL — ABNORMAL LOW (ref 6.5–8.1)

## 2019-06-21 LAB — CBC
HCT: 32.9 % — ABNORMAL LOW (ref 36.0–46.0)
Hemoglobin: 11.2 g/dL — ABNORMAL LOW (ref 12.0–15.0)
MCH: 32.8 pg (ref 26.0–34.0)
MCHC: 34 g/dL (ref 30.0–36.0)
MCV: 96.5 fL (ref 80.0–100.0)
Platelets: 162 K/uL (ref 150–400)
RBC: 3.41 MIL/uL — ABNORMAL LOW (ref 3.87–5.11)
RDW: 12.8 % (ref 11.5–15.5)
WBC: 8 K/uL (ref 4.0–10.5)
nRBC: 0 % (ref 0.0–0.2)

## 2019-06-21 LAB — KAPPA/LAMBDA LIGHT CHAINS
Kappa free light chain: 43.5 mg/L — ABNORMAL HIGH (ref 3.3–19.4)
Kappa, lambda light chain ratio: 2.34 — ABNORMAL HIGH (ref 0.26–1.65)
Lambda free light chains: 18.6 mg/L (ref 5.7–26.3)

## 2019-06-21 LAB — C3 COMPLEMENT: C3 Complement: 102 mg/dL (ref 82–167)

## 2019-06-21 LAB — UREA NITROGEN, URINE: Urea Nitrogen, Ur: 389 mg/dL

## 2019-06-21 LAB — ANTINUCLEAR ANTIBODIES, IFA: ANA Ab, IFA: NEGATIVE

## 2019-06-21 LAB — BRAIN NATRIURETIC PEPTIDE: B Natriuretic Peptide: 330.9 pg/mL — ABNORMAL HIGH (ref 0.0–100.0)

## 2019-06-21 LAB — C4 COMPLEMENT: Complement C4, Body Fluid: 23 mg/dL (ref 12–38)

## 2019-06-21 MED ORDER — ALPRAZOLAM 0.25 MG PO TABS
0.1250 mg | ORAL_TABLET | Freq: Two times a day (BID) | ORAL | Status: DC | PRN
  Administered 2019-06-21 – 2019-06-23 (×3): 0.25 mg via ORAL
  Filled 2019-06-21 (×3): qty 1

## 2019-06-21 MED ORDER — SODIUM BICARBONATE-DEXTROSE 150-5 MEQ/L-% IV SOLN
150.0000 meq | INTRAVENOUS | Status: AC
  Administered 2019-06-21: 150 meq via INTRAVENOUS
  Filled 2019-06-21 (×2): qty 1000

## 2019-06-21 NOTE — Evaluation (Signed)
Occupational Therapy Evaluation Patient Details Name: Charlene Dickerson MRN: [id number] DOB: [birth year] Today's Date: 06/21/2019    History of Present Illness 86 yo admitted with fatigue and lower back pain pt with L4 fx, aKI with metabolic acidosis and severe hyperkalemia with episode of tachycardia in ED s/p shock. Pt transferred from [facility] to [facility] for urgent HD. PMhx: gERD, HTN, PAF, CAD   Clinical Impression   Pt PTA: Pt living home alone and reports independence prior with family out of town. Pt currently limited by back pain, decreased strength, and decreased activity tolerance. Pt's supportive daughter at bedside throughout session. Pt currently minA overall for ADL and minA for transfers and mobility ~40' with RW in hallway. Pt with confusion after unknowingly had BM in chair after first stand with OTR. Pt with impaired memory and problem solving at this time. Pt would greatly benefit from continued OT skilled services acutely and in post acute setting. OT following.   Pt VSS on RA. Hr <105 BPM.      Follow Up Recommendations  SNF(HHOT vs SNF based on progress)    Equipment Recommendations  3 in 1 bedside commode    Recommendations for Other Services       Precautions / Restrictions Precautions Precautions: Back;Fall Precaution Booklet Issued: Yes (comment) Precaution Comments: verbal discussion of back precautions due to L4 closed compression fx. Restrictions Weight Bearing Restrictions: No      Mobility Bed Mobility Overal bed mobility: Needs Assistance Bed Mobility: Rolling;Sidelying to Sit Rolling: Min assist Sidelying to sit: Min assist       General bed mobility comments: in recliner pre and post session  Transfers Overall transfer level: Needs assistance Equipment used: Rolling walker (2 wheeled) Transfers: Sit to/from [employer] Sit to Stand: Min assist Stand pivot transfers: Min assist       General transfer comment: minA to stand from  recliner x2 times and from commode x2 times    Balance Overall balance assessment: Needs assistance   Sitting balance-Leahy Scale: Fair Sitting balance - Comments: able to sit EOB without support   Standing balance support: Single extremity supported;Bilateral upper extremity supported;During functional activity Standing balance-Leahy Scale: Poor Standing balance comment: Pt single UE used for support                           ADL either performed or assessed with clinical judgement   ADL Overall ADL's : Needs assistance/impaired Eating/Feeding: Modified independent;Sitting   Grooming: Min guard;Standing   Upper Body Bathing: Min guard;Standing   Lower Body Bathing: Minimal assistance;Sitting/lateral leans;Sit to/from stand;Cueing for safety   Upper Body Dressing : Set up;Sitting   Lower Body Dressing: Minimal assistance;Cueing for safety;Cueing for sequencing;Sitting/lateral leans;Sit to/from stand Lower Body Dressing Details (indicate cue type and reason): Cues for decreased bending and twisting. Toilet Transfer: Minimal assistance;Cueing for safety;Ambulation;Grab bars;RW   Toileting- Clothing Manipulation and Hygiene: Minimal assistance;Cueing for safety;Cueing for sequencing;Sitting/lateral lean;Sit to/from stand Toileting - Clothing Manipulation Details (indicate cue type and reason): Pt requiring assist due to IV lines in wrist and pt stood for peri care, but unable to perform in standing without bending at this time.     Functional mobility during ADLs: Min guard;Rolling walker;Cueing for safety General ADL Comments: pt limited by back pain, decreased strength, and decreased activity tolerance.     Vision Baseline Vision/History: No visual deficits Patient Visual Report: No change from baseline Vision Assessment?: No apparent visual deficits  Perception     Praxis      Pertinent Vitals/Pain Pain Assessment: 0-10 Pain Score: 4  Pain Location:  back pain with activity Pain Descriptors / Indicators: Aching;Sore Pain Intervention(s): Monitored during session     Hand Dominance Right   Extremity/Trunk Assessment Upper Extremity Assessment Upper Extremity Assessment: Overall WFL for tasks assessed   Lower Extremity Assessment Lower Extremity Assessment: Generalized weakness   Cervical / Trunk Assessment Cervical / Trunk Assessment: Kyphotic   Communication Communication Communication: HOH   Cognition Arousal/Alertness: Awake/alert Behavior During Therapy: WFL for tasks assessed/performed Overall Cognitive Status: Impaired/Different from baseline Area of Impairment: Memory;Problem solving;Awareness                     Memory: Decreased short-term memory     Awareness: Intellectual Problem Solving: Slow processing General Comments: Cues to problem solve through task; pt unaware of BM in chair had started; pt was bright spirited and wanting to perform own ADLs   General Comments  Pt ambulating in hallway 40' with RW and minguardA overall for stability. Pt's supportive daughter at bedside. Pt VSS on RA. Hr <105 BPM.    Exercises     Shoulder Instructions      Home Living Family/patient expects to be discharged to:: Private residence Living Arrangements: Alone Available Help at Discharge: Family;Available PRN/intermittently Type of Home: House Home Access: Stairs to enter [employer] of Steps: 3 Entrance Stairs-Rails: Right;Left Home Layout: Two level;Able to live on main level with bedroom/bathroom     Bathroom Shower/Tub: Occupational psychologist: Standard     Home Equipment: Environmental consultant - 2 wheels;Cane - single point;Shower seat;Grab bars - tub/shower   Additional Comments: Most family lives Out of town or works and cannot provide 24hr support      Prior Functioning/Environment Level of Independence: Independent        Comments[place] has been using  RW recently since back pain started.        OT Problem List: Decreased strength;Decreased activity tolerance;Impaired balance (sitting and/or standing);Decreased cognition;Pain;Decreased knowledge of use of DME or AE;Decreased safety awareness      OT Treatment/Interventions: Self-care/ADL training;Therapeutic exercise;Energy conservation;DME and/or AE instruction;Therapeutic activities;Patient/family education;Cognitive remediation/compensation;Balance training    OT Goals(Current goals can be found in the care plan section) Acute Rehab OT Goals Patient Stated Goal: to get home OT Goal Formulation: With patient Time For Goal Achievement: 07/05/19 Potential to Achieve Goals: Good ADL Goals Pt Will Perform Grooming: with modified independence;standing Pt Will Perform Lower Body Dressing: with supervision;sitting/lateral leans;sit to/from stand Pt Will Perform Toileting - Clothing Manipulation and hygiene: with supervision;sitting/lateral leans;sit to/from stand Additional ADL Goal #1: Pt will verbalize 3 strategies to reduce risk of falls at home after education with minimal cues for recall. Additional ADL Goal #2: Pt will complete x15 mins of ADL tasks with supervisionA with 1 seated rest break in order to increase endurance.  OT Frequency: Min 2X/week   Barriers to D/C: Decreased caregiver support  out of town family members       Co-evaluation              AM-PAC OT "6 Clicks" Daily Activity     Outcome Measure Help from another person eating meals?: None Help from another person taking care of personal grooming?: A Little Help from another person toileting, which includes using toliet, bedpan, or urinal?: A Little Help from another person bathing (including washing, rinsing, drying)?: A  Little Help from another person to put on and taking off regular upper body clothing?: None Help from another person to put on and taking off regular lower body clothing?: A Little 6  Click Score: 20   End of Session Equipment Utilized During Treatment: Gait belt;Rolling walker Nurse Communication: Mobility status  Activity Tolerance: Patient tolerated treatment well Patient left: in chair;with call bell/phone within reach;with chair alarm set;with family/visitor present  OT Visit Diagnosis: Unsteadiness on feet (R26.81);Muscle weakness (generalized) (M62.81);Pain Pain - part of body: (back)                Time: 8850-2774 OT Time Calculation (min): 35 min Charges:  OT General Charges $OT Visit: 1 Visit OT Evaluation $OT Eval Moderate Complexity: 1 Mod OT Treatments $Self Care/Home Management : 8-22 mins  Flora Lipps, OTR/L Acute Rehabilitation Services Pager: [phone] Office: [phone]  Cleva Camero C 06/21/2019, 4:03 PM

## 2019-06-21 NOTE — Progress Notes (Signed)
[facility] Progress Note    Assessment/ Plan:   1. AKI  Creatinine continue to improve, 1.95 today. Will monitor for one more day. Patient likely will not have to follow up at [facility] if she continues to do well.  Patient placed on oxygen overnight, though she reports she was not short of breath.  She does not know why the oxygen was put on.  Her lungs are clear and she is breathing comfortably today.  Continue light fluids for hydration, encourage PO intake  DC lokelma, DC catheter  Continue to hold home lasix   Monitor Cr daily   2. Hyperkalemia, resolved   3.  Elevated UPC EPC on arrival 1.14.C3, C4 wn.   F/u labs (kappa lambda, ANA, SPEP)  3. UTI >100 k GNR in culture. Day 2 IV CTX . Per primary    A fib on eliquis/CHF/Essential HTN/CAD BP 110/47. New O2 requirement this morning 2L. BNP increase to 330.9 from 144.3  Continue telemetry  Resume Eliquis when appropriate   Resme lasix + K supp when appropriate   Continue home plavix, atorvastatin, propranalol   Rest per primary    Closed compression fracture L4  Osteoporosis  Dexa in 2003 osteoporotic with high fracture risk. Currently takes calcium supplementation  Avoid NSAIDs for pain   Rest per primary   Subjective:   Doing well today.  No complaints.   Objective:   BP (!) 110/47 (BP Location: Left Arm)   Pulse 68   Temp 97.6 F (36.4 C) (Oral)   Resp 15   Ht 5\' 2"  (1.575 m)   Wt 66.1 kg   SpO2 98%   BMI 26.65 kg/m   Intake/Output Summary (Last 24 hours) at 06/21/2019 0751 Last data filed at 06/21/2019 0700 Gross per 24 hour  Intake 4351.15 ml  Output 1350 ml  Net 3001.15 ml   Weight change: -0.4 kg  Physical Exam: General: Patient appears better today and less tired. Neck: No JVD Chest: Irregularly irregular.  Distant heart sounds. Lungs: Clear to auscultation bilaterally. MSK: No lower extremity edema, spontaneously moving extremities.  Imaging: CTAP 06/20/19 Diffuse  colonic diverticulosis.  No active diverticulitis. Aortic atherosclerosis. Moderate L4 compression fracture, which appears acute. Coronary artery disease.   4/18 CXR  No active cardiopulmonary disease. No evidence of pneumonia or pulmonary edema  ECHOCARDIOGRAM COMPLETE IMPRESSIONS   1. Left ventricular ejection fraction, by estimation, is 60 to 65%. The left ventricle has normal function. The left ventricle has no regional wall motion abnormalities. Left ventricular diastolic parameters are consistent with Grade I diastolic dysfunction (impaired relaxation).   2. Right ventricular systolic function is normal. The right ventricular size is normal. There is mildly elevated pulmonary artery systolic pressure. The estimated right ventricular systolic pressure is 32.4 mmHg.   3. The mitral valve is normal in structure. No evidence of mitral valve regurgitation. No evidence of mitral stenosis.  4. Tricuspid valve regurgitation is moderate.   5. The aortic valve is normal in structure. Aortic valve regurgitation is moderate. No aortic stenosis is present.  6. The inferior vena cava is normal in size with greater than 50% respiratory variability, suggesting right atrial pressure of 3 mmHg.   Labs: BMET Recent Labs  Lab 06/20/19 0301 06/20/19 0301 06/20/19 0442 06/20/19 0850 06/20/19 1321 06/20/19 1602 06/20/19 2033 06/21/19 0002 06/21/19 0246  NA 137  137   < > 135 135 135 136 137 140 140  K 6.3*  6.3*   < > 5.4*  4.5 4.4 4.5 4.2 3.8 3.5  CL 106  105   < > 101 99 96* 98 101 100 100  CO2 16*  16*   < > 17* 22 26 28 28 28  33*  GLUCOSE 113*  111*   < > 174* 199* 213* 190* 135* 121* 112*  BUN 163*  163*   < > 160* 80* 83* 85* 78* 71* 65*  CREATININE 5.57*  5.27*   < > 5.40* 3.27* 3.57* 3.36* 2.66* 2.27* 1.95*  CALCIUM 12.7*  12.5*   < > 12.2* 10.0 9.2 9.0 8.4* 8.5* 8.4*  PHOS 7.1*  --   --  4.4 4.4 4.1 3.8 3.3  --    < > = values in this interval not displayed.   CBC Recent Labs   Lab 06/19/19 2009 06/20/19 0125 06/20/19 0301 06/20/19 0442 06/20/19 0850 06/21/19 0246  WBC 11.2*  --  8.0 12.6* 9.5 8.0  NEUTROABS 9.1*  --   --  10.1* 7.7  --   HGB 15.3*   < > 14.2 13.8 12.8 11.2*  HCT 46.9*   < > 40.8 40.2 36.6 32.9*  MCV 102.6*  --  99.3 98.0 94.8 96.5  PLT 269  --  227 249 215 162   < > = values in this interval not displayed.    Medications:    . atorvastatin  40 mg Oral q1800  . Chlorhexidine Gluconate Cloth  6 each Topical Q0600  . clopidogrel  75 mg Oral Daily  . docusate sodium  100 mg Oral BID  . feeding supplement (ENSURE ENLIVE)  237 mL Oral BID BM  . latanoprost  1 drop Both Eyes QHS  . multivitamin with minerals  1 tablet Oral Daily  . propranolol  10 mg Oral BID  . sodium chloride flush  3 mL Intravenous Q12H  . sodium zirconium cyclosilicate  10 g Oral Daily    Zettie Cooley, MD [facility] Family Medicine Resident, PGY2 06/21/2019, 7:51 AM

## 2019-06-21 NOTE — Progress Notes (Signed)
IR requested by Dr. Jonathon Bellows for possible image-guided L4 KP/VP.  Case has been reviewed by Dr. Corliss Skains who approves procedure. UA and culture 06/19/2019 indicative of UTI. Per Dr. Corliss Skains, patient must complete minimum of 3 days of IV antibiotics with improvement of UA prior to proceeding with procedure. Dr. Jonathon Bellows made aware- he states patient was started on IV antibiotics for UTI 06/20/2019. Will repeat UA tomorrow 06/22/2019 to assess UTI- order placed. Insurance authorization pending. Patient will be seen/consented by IR PA prior to procedure.  IR to follow.   Charlene Boga Kennidy Lamke, PA-C 06/21/2019, 3:06 PM

## 2019-06-21 NOTE — Evaluation (Signed)
Physical Therapy Evaluation Patient Details Name: Charlene Dickerson MRN: [id number] DOB: [birth year] Today's Date: 06/21/2019   History of Present Illness  86 yo admitted with fatigue and lower back pain pt with L4 fx, aKI with metabolic acidosis and severe hyperkalemia with episode of tachycardia in ED s/p shock. Pt transferred from [facility] to [facility] for urgent HD. PMhx: gERD, HTN, PAF, CAD  Clinical Impression  Pt pleasant and willing to mobilize. Pt lives alone with a cat and does not have 24hr assist at home. Pt currently with decreased problem solving, decreased mobility and functional activity who will benefit from acute therapy to maximize mobility and function for hopeful return home. If pt were to progress to supervision or mod I level then home may be an option with intermittent assist. Pt resistant to the idea of SNF but does not currently have sufficient support for current function. Pt felt lightheaded with transfer to standing with BP 131/95, HR 88 and SpO2 95% on RA. Of note pt did become nauseous after mobility with emesis after sitting. Pt and daughter educated for back precautions given current fx with handout provided and for D/c recommendations.    Follow Up Recommendations SNF;Supervision/Assistance - 24 hour    Equipment Recommendations  None recommended by PT    Recommendations for Other Services OT consult     Precautions / Restrictions Precautions Precautions: Back;Fall Precaution Booklet Issued: Yes (comment)      Mobility  Bed Mobility Overal bed mobility: Needs Assistance Bed Mobility: Rolling;Sidelying to Sit Rolling: Min assist Sidelying to sit: Min assist       General bed mobility comments: cues for sequence to protect back with min assist to complete transitions  Transfers Overall transfer level: Needs assistance   Transfers: Sit to/from Stand;Stand Pivot Transfers Sit to Stand: Min assist Stand pivot transfers: Min assist       General transfer  comment: min assist to stand from bed, when attempting to step pt reported dizziness and returned to sitting. VSS and stood again with RW present, cues for hand placement and pivot to recliner  Ambulation/Gait             General Gait Details: pt declined attempting  Stairs            Wheelchair Mobility    Modified Rankin (Stroke Patients Only)       Balance Overall balance assessment: Needs assistance   Sitting balance-Leahy Scale: Fair Sitting balance - Comments: able to sit EOB without support   Standing balance support: Single extremity supported Standing balance-Leahy Scale: Poor Standing balance comment: single and bil UE support in standing                             Pertinent Vitals/Pain Pain Assessment: 0-10 Pain Score: 5  Pain Location: back pain with activity Pain Descriptors / Indicators: Aching;Sore Pain Intervention(s): Limited activity within patient's tolerance;Monitored during session;Repositioned    Home Living Family/patient expects to be discharged to:: Private residence Living Arrangements: Alone Available Help at Discharge: Family;Available PRN/intermittently Type of Home: House Home Access: Stairs to enter Entrance Stairs-Rails: Right;Left Entrance Stairs-Number of Steps: 3 Home Layout: Two level;Able to live on main level with bedroom/bathroom Home Equipment: Gilford Rile - 2 wheels;Cane - single point;Shower seat;Grab bars - tub/shower Additional Comments: Most family lives Out of town or works and cannot provide 24hr support    Prior Function Level of Independence: Independent  Comments: normally walks a [place] park. has been using RW recently since back pain started.     Hand Dominance        Extremity/Trunk Assessment   Upper Extremity Assessment Upper Extremity Assessment: Generalized weakness    Lower Extremity Assessment Lower Extremity Assessment: Generalized weakness    Cervical / Trunk  Assessment Cervical / Trunk Assessment: Kyphotic  Communication   Communication: HOH  Cognition Arousal/Alertness: Awake/alert Behavior During Therapy: Flat affect Overall Cognitive Status: Impaired/Different from baseline Area of Impairment: Memory;Problem solving                     Memory: Decreased short-term memory       Problem Solving: Slow processing General Comments: pt not oriented to president but could select from 4 choices, unable to spell world backward      General Comments      Exercises     Assessment/Plan    PT Assessment Patient needs continued PT services  PT Problem List Decreased strength;Decreased mobility;Decreased safety awareness;Decreased activity tolerance;Decreased balance;Decreased knowledge of use of DME;Decreased cognition       PT Treatment Interventions DME instruction;Therapeutic exercise;Gait training;Balance training;Stair training;Functional mobility training;Cognitive remediation;Therapeutic activities;Patient/family education    PT Goals (Current goals can be found in the Care Plan section)  Acute Rehab PT Goals Patient Stated Goal: return home to my cat PT Goal Formulation: With patient/family Time For Goal Achievement: 07/05/19 Potential to Achieve Goals: Fair    Frequency Min 3X/week   Barriers to discharge Decreased caregiver support      Co-evaluation               AM-PAC PT "6 Clicks" Mobility  Outcome Measure Help needed turning from your back to your side while in a flat bed without using bedrails?: A Little Help needed moving from lying on your back to sitting on the side of a flat bed without using bedrails?: A Little Help needed moving to and from a bed to a chair (including a wheelchair)?: A Little Help needed standing up from a chair using your arms (e.g., wheelchair or bedside chair)?: A Little Help needed to walk in hospital room?: A Lot Help needed climbing 3-5 steps with a railing? : A Lot 6  Click Score: 16    End of Session Equipment Utilized During Treatment: Gait belt Activity Tolerance: Patient tolerated treatment well Patient left: in chair;with call bell/phone within reach;with chair alarm set;with nursing/sitter in room;with family/visitor present Nurse Communication: Mobility status;Precautions PT Visit Diagnosis: Other abnormalities of gait and mobility (R26.89);Difficulty in walking, not elsewhere classified (R26.2);Muscle weakness (generalized) (M62.81)    Time: 0973-5329 PT Time Calculation (min) (ACUTE ONLY): 28 min   Charges:   PT Evaluation $PT Eval Moderate Complexity: 1 Mod PT Treatments $Therapeutic Activity: 8-22 mins        Corabelle Spackman P, PT Acute Rehabilitation Services Pager: [phone] Office: [phone]   Enedina Finner Leanna Hamid 06/21/2019, 12:15 PM

## 2019-06-21 NOTE — Progress Notes (Signed)
Progress Note  Patient Name: Charlene Dickerson Date of Encounter: 06/21/2019  Primary Cardiologist: Kristeen Miss, MD   Subjective   Denies any chest pain or dyspnea  Inpatient Medications    Scheduled Meds: . atorvastatin  40 mg Oral q1800  . Chlorhexidine Gluconate Cloth  6 each Topical Q0600  . clopidogrel  75 mg Oral Daily  . docusate sodium  100 mg Oral BID  . feeding supplement (ENSURE ENLIVE)  237 mL Oral BID BM  . latanoprost  1 drop Both Eyes QHS  . multivitamin with minerals  1 tablet Oral Daily  . propranolol  10 mg Oral BID  . sodium chloride flush  3 mL Intravenous Q12H  . sodium zirconium cyclosilicate  10 g Oral Daily   Continuous Infusions: . sodium chloride 50 mL/hr at 06/21/19 0200  . sodium chloride    . sodium chloride    . cefTRIAXone (ROCEPHIN)  IV Stopped (06/20/19 2257)   PRN Meds: sodium chloride, sodium chloride, albuterol, alteplase, heparin, lidocaine (PF), lidocaine-prilocaine, ondansetron **OR** ondansetron (ZOFRAN) IV, pentafluoroprop-tetrafluoroeth   Vital Signs    Vitals:   06/21/19 0200 06/21/19 0400 06/21/19 0410 06/21/19 0800  BP: (!) 96/59 (!) 110/47  102/60  Pulse: 77 68  77  Resp: 18 15  16   Temp: 97.8 F (36.6 C) 97.6 F (36.4 C)  97.7 F (36.5 C)  TempSrc: Oral Oral  Oral  SpO2: 98% 98%  93%  Weight:   66.1 kg   Height:   5\' 2"  (1.575 m)     Intake/Output Summary (Last 24 hours) at 06/21/2019 0846 Last data filed at 06/21/2019 0700 Gross per 24 hour  Intake 4231.15 ml  Output 1350 ml  Net 2881.15 ml   Last 3 Weights 06/21/2019 06/20/2019 06/15/2019  Weight (lbs) 145 lb 11.6 oz 146 lb 9.7 oz 151 lb  Weight (kg) 66.1 kg 66.5 kg 68.493 kg      Telemetry    Sinus rhythm with rate 60s to 70s, PVCs- Personally Reviewed  ECG    No new EKG- Personally Reviewed  Physical Exam   GEN: No acute distress.   Neck: No JVD Cardiac: RRR, no murmurs, rubs, or gallops.  Respiratory: Clear to auscultation bilaterally. GI:  Soft, nontender, non-distended  MS: No edema Neuro:  Nonfocal  Psych: Normal affect   Labs    High Sensitivity Troponin:   Recent Labs  Lab 06/20/19 0301 06/20/19 0442  TROPONINIHS 36* 64*      Chemistry Recent Labs  Lab 06/20/19 0442 06/20/19 0442 06/20/19 0850 06/20/19 1321 06/20/19 2033 06/21/19 0002 06/21/19 0246  NA 135   < > 135   < > 137 140 140  K 5.4*   < > 4.5   < > 4.2 3.8 3.5  CL 101   < > 99   < > 101 100 100  CO2 17*   < > 22   < > 28 28 33*  GLUCOSE 174*   < > 199*   < > 135* 121* 112*  BUN 160*   < > 80*   < > 78* 71* 65*  CREATININE 5.40*   < > 3.27*   < > 2.66* 2.27* 1.95*  CALCIUM 12.2*   < > 10.0   < > 8.4* 8.5* 8.4*  PROT 6.6  --  6.1*  --   --   --  5.3*  ALBUMIN 3.6   < > 3.3*   < > 2.7* 2.8* 2.8*  AST 22  --  21  --   --   --  17  ALT 21  --  20  --   --   --  18  ALKPHOS 44  --  43  --   --   --  35*  BILITOT 0.8  --  1.2  --   --   --  0.9  GFRNONAA 7*   < > 12*   < > 16* 19* 23*  GFRAA 8*   < > 14*   < > 18* 22* 27*  ANIONGAP 17*   < > 14   < > 8 12 7    < > = values in this interval not displayed.     Hematology Recent Labs  Lab 06/20/19 0442 06/20/19 0850 06/21/19 0246  WBC 12.6* 9.5 8.0  RBC 4.10 3.86* 3.41*  HGB 13.8 12.8 11.2*  HCT 40.2 36.6 32.9*  MCV 98.0 94.8 96.5  MCH 33.7 33.2 32.8  MCHC 34.3 35.0 34.0  RDW 13.0 12.6 12.8  PLT 249 215 162    BNP Recent Labs  Lab 06/20/19 0528 06/21/19 0246  BNP 144.3* 330.9*     DDimer No results for input(s): DDIMER in the last 168 hours.   Radiology    CT ABDOMEN PELVIS WO CONTRAST  Result Date: 06/20/2019 CLINICAL DATA:  Lower back pain.  Pancreatitis suspected EXAM: CT ABDOMEN AND PELVIS WITHOUT CONTRAST TECHNIQUE: Multidetector CT imaging of the abdomen and pelvis was performed following the standard protocol without IV contrast. COMPARISON:  None. FINDINGS: Lower chest: Densely calcified visualized coronary arteries. Lung bases clear. No effusions. Hepatobiliary: No  focal hepatic abnormality. Gallbladder unremarkable. Pancreas: No focal abnormality or ductal dilatation. Spleen: No focal abnormality.  Normal size. Adrenals/Urinary Tract: 4.6 cm cyst in the midpole of the left kidney. No hydronephrosis. Adrenal glands and urinary bladder unremarkable. Stomach/Bowel: Diffuse colonic diverticulosis, most pronounced in the sigmoid colon. No active diverticulitis. No evidence of bowel obstruction. Vascular/Lymphatic: Aortic atherosclerosis. No enlarged abdominal or pelvic lymph nodes. Reproductive: Uterus and adnexa unremarkable.  No mass. Other: No free fluid or free air. Musculoskeletal: Moderate compression fracture through the superior endplate of L4 appears acute. Diffuse degenerative disc and facet disease. IMPRESSION: Diffuse colonic diverticulosis.  No active diverticulitis. Aortic atherosclerosis. Moderate L4 compression fracture, which appears acute. Coronary artery disease. Electronically Signed   By: 06/22/2019 M.D.   On: 06/20/2019 00:05   DG Chest 2 View  Result Date: 06/19/2019 CLINICAL DATA:  Fatigue, generalized weakness. EXAM: CHEST - 2 VIEW COMPARISON:  Chest x-ray dated 02/20/2018. FINDINGS: Heart size and mediastinal contours are stable. Lungs are clear. No pleural effusion is seen. Osseous structures about the chest are unremarkable. IMPRESSION: No active cardiopulmonary disease. No evidence of pneumonia or pulmonary edema. Electronically Signed   By: 02/22/2018 M.D.   On: 06/19/2019 20:22   ECHOCARDIOGRAM COMPLETE  Result Date: 06/20/2019    ECHOCARDIOGRAM REPORT   Patient Name:   Charlene Dickerson Date of Exam: 06/20/2019 Medical Rec #:  06/22/2019      Height:       62.0 in Accession #:    [id number]     Weight:       146.6 lb Date of Birth:  May 07, 1932      BSA:          1.675 m Patient Age:    86 years       BP:  100/57 mmHg Patient Gender: F              HR:           79 bpm. Exam Location:  Inpatient Procedure: 2D Echo, Cardiac Doppler  and Color Doppler Indications:    Ventricular tachycardia  History:        Patient has prior history of Echocardiogram examinations, most                 recent 02/19/2018. Acute MI, Arrythmias:Atrial Fibrillation;                 Risk Factors:Hypertension. AKI.  Sonographer:    Lavenia Atlas Referring Phys: [phone] ANASTASSIA DOUTOVA IMPRESSIONS  1. Left ventricular ejection fraction, by estimation, is 60 to 65%. The left ventricle has normal function. The left ventricle has no regional wall motion abnormalities. Left ventricular diastolic parameters are consistent with Grade I diastolic dysfunction (impaired relaxation).  2. Right ventricular systolic function is normal. The right ventricular size is normal. There is mildly elevated pulmonary artery systolic pressure. The estimated right ventricular systolic pressure is 32.4 mmHg.  3. The mitral valve is normal in structure. No evidence of mitral valve regurgitation. No evidence of mitral stenosis.  4. Tricuspid valve regurgitation is moderate.  5. The aortic valve is normal in structure. Aortic valve regurgitation is moderate. No aortic stenosis is present.  6. The inferior vena cava is normal in size with greater than 50% respiratory variability, suggesting right atrial pressure of 3 mmHg. FINDINGS  Left Ventricle: Left ventricular ejection fraction, by estimation, is 60 to 65%. The left ventricle has normal function. The left ventricle has no regional wall motion abnormalities. The left ventricular internal cavity size was normal in size. There is  no left ventricular hypertrophy. Left ventricular diastolic parameters are consistent with Grade I diastolic dysfunction (impaired relaxation). Right Ventricle: The right ventricular size is normal. No increase in right ventricular wall thickness. Right ventricular systolic function is normal. There is mildly elevated pulmonary artery systolic pressure. The tricuspid regurgitant velocity is 2.71  m/s, and with an  assumed right atrial pressure of 3 mmHg, the estimated right ventricular systolic pressure is 32.4 mmHg. Left Atrium: Left atrial size was normal in size. Right Atrium: Right atrial size was normal in size. Pericardium: There is no evidence of pericardial effusion. Mitral Valve: The mitral valve is normal in structure. Normal mobility of the mitral valve leaflets. Moderate mitral annular calcification. No evidence of mitral valve regurgitation. No evidence of mitral valve stenosis. Tricuspid Valve: The tricuspid valve is normal in structure. Tricuspid valve regurgitation is moderate . No evidence of tricuspid stenosis. Aortic Valve: The aortic valve is normal in structure. Aortic valve regurgitation is moderate. No aortic stenosis is present. Pulmonic Valve: The pulmonic valve was normal in structure. Pulmonic valve regurgitation is not visualized. No evidence of pulmonic stenosis. Aorta: The aortic root is normal in size and structure. Venous: The inferior vena cava is normal in size with greater than 50% respiratory variability, suggesting right atrial pressure of 3 mmHg. IAS/Shunts: No atrial level shunt detected by color flow Doppler.  LEFT VENTRICLE PLAX 2D LVIDd:         5.00 cm  Diastology LVIDs:         4.20 cm  LV e' lateral:   5.77 cm/s LV PW:         1.00 cm  LV E/e' lateral: 11.2 LV IVS:        0.80  cm  LV e' medial:    5.44 cm/s LVOT diam:     1.90 cm  LV E/e' medial:  11.9 LV SV:         51 LV SV Index:   31 LVOT Area:     2.84 cm  RIGHT VENTRICLE RV Basal diam:  2.60 cm RV S prime:     17.30 cm/s TAPSE (M-mode): 2.9 cm LEFT ATRIUM             Index       RIGHT ATRIUM           Index LA diam:        4.50 cm 2.69 cm/m  RA Area:     15.80 cm LA Vol (A2C):   58.3 ml 34.80 ml/m RA Volume:   40.20 ml  24.00 ml/m LA Vol (A4C):   56.6 ml 33.78 ml/m LA Biplane Vol: 61.3 ml 36.59 ml/m  AORTIC VALVE LVOT Vmax:   86.70 cm/s LVOT Vmean:  54.500 cm/s LVOT VTI:    0.181 m  AORTA Ao Root diam: 3.30 cm MITRAL  VALVE                TRICUSPID VALVE MV Area (PHT): 3.72 cm     TR Peak grad:   29.4 mmHg MV Decel Time: 204 msec     TR Vmax:        271.00 cm/s MV E velocity: 64.90 cm/s MV A velocity: 118.00 cm/s  SHUNTS MV E/A ratio:  0.55         Systemic VTI:  0.18 m                             Systemic Diam: 1.90 cm Candee Furbish MD Electronically signed by Candee Furbish MD Signature Date/Time: 06/20/2019/3:26:45 PM    Final     Cardiac Studies   TTE 06/20/19: 1. Left ventricular ejection fraction, by estimation, is 60 to 65%. The  left ventricle has normal function. The left ventricle has no regional  wall motion abnormalities. Left ventricular diastolic parameters are  consistent with Grade I diastolic  dysfunction (impaired relaxation).  2. Right ventricular systolic function is normal. The right ventricular  size is normal. There is mildly elevated pulmonary artery systolic  pressure. The estimated right ventricular systolic pressure is 75.6 mmHg.  3. The mitral valve is normal in structure. No evidence of mitral valve  regurgitation. No evidence of mitral stenosis.  4. Tricuspid valve regurgitation is moderate.  5. The aortic valve is normal in structure. Aortic valve regurgitation is  moderate. No aortic stenosis is present.  6. The inferior vena cava is normal in size with greater than 50%  respiratory variability, suggesting right atrial pressure of 3 mmHg.   Patient Profile     86 y.o. female hypertension, STEMI (2019), HL, GERD, PAF, diverticulosis who is being seen today for the evaluation of WCT   Assessment & Plan    VT: developed acutely while in the ED at [facility]. In the setting of K+ 7.5, and AKI with Cr 5.8. She was cardioverted x1 successfully, did not lose a pulse. Telemetry review shows SR with PVCs. Suspect her arrhythmia was related to her electrolyte imbalances.  Normal biventricular function on echo.  No further cardiac work-up recommended, would continue to monitor on  telemetry  Hyperkalemia: resolved, K+ 7.5 on admission, s/p emergent HD, down to 3.5 today  AKI: suspect 2/2  NSAID use bc of her lower back pain.  Underwent emergent HD, creatinine improving  PAF:  CHA2DS2-VASc 4 (agex2, CAD, female).  On Eliquis prior to admission. Currently in SR. Eliquis has been held. If renal function stabilizes would plan to restart Eliquis, if going to be off Eliquis for extended period would start heparin gtt  Inferior STEMI: back in 12/19 c/b [facility]B. Resolved and has been doing quite well since then. hsTn with low flat trend. EKG without ischemia. Remains on plavix, no ASA with the need for Eliquis. TTE shows normal LV systolic function.     For questions or updates, please contact [facility] Please consult [email or web address] for contact info under        Signed, Little Ishikawa, MD  06/21/2019, 8:46 AM

## 2019-06-21 NOTE — Progress Notes (Addendum)
PROGRESS NOTE    Charlene Dickerson  YWV:[id number] DOB: 15-Jan-1933 DOA: 06/19/2019 PCP: Martha Clan, MD   Brief Narrative: 86 year old female with history of GERD, hypertension, osteoporosis, PAF, CAD/MI s/p stent in 2019 December on Lasix, potassium supplementation at home, recently seeing orthopedics for low back pain and on Tylenol and Advil repeatedly throughout the day for past week, decreased p.o. intake presented with , fatigue x 1 wk.   In the ED, UA worrisome for UTI and lab with potassium 7.3, creatinine 5.8, peaked T waves, discussion nephrology status post temporizing measures.  IN ER patient went into wide tachy rhythm-received ca 2 amp,150 of Amiodarone,2 amps of bicarb Pt remained alert.She was given a synchronized shock and converted to sinus rhythm.Pt was briefly bagged Patient was transferred to [facility] had a HD catheter placed by EDP and underwent emergent dialysis during night with subsequent improvement/normalization of the potassium.    Subjective: Complains some back pain, not worse.  No new complaints.  Feels much improved this morning. Patient had Foley catheter placed overnight.  Blood pressure was soft in 80s to 90s and was given IV fluid boluses 4/19 pm.Creatinine improving 1.9, UOP 1350.  Assessment & Plan:  Acute kidney injury W/ metabolic acidosis and hyperkalemia, uremia: Due to poor intake, NSAIDs for back pain, diuretics.  HD Catheter placed in ED, nephrology on board work-up in progress with ANA, complements, UP/C, SPEP, light chains, urine culture.status post emergent dialysis during night for high k 4.19> On ns and bicarb IV fluids: Defer IV fluids , HD cath management to nephrology. Has foley now and UOP assuring. Bun/creat improving, bicarb at 33. Recent Labs  Lab 06/20/19 1321 06/20/19 1602 06/20/19 2033 06/21/19 0002 06/21/19 0246  BUN 83* 85* 78* 71* 65*  CREATININE 3.57* 3.36* 2.66* 2.27* 1.95*   Severe Hyperkalemia, status post emergent  dialysis-now normalized. Charlene Dickerson not given.  Wide complex Tachycardia/VT s/p shock in ED, s/p temporizing measures/amiod, in the setting of hyperkalemia. Cardiology eval appreciated suspect due to elevated imbalance.TTE was done to evaluate EF-EF 60 to 60% normal, no Regional WMA.  Possible WNI:OEVOJJK with gram-negative rods more than 100,000. on empiric ceftriaxone follow-up urine culture.  Paroxysmal atrial fibrillation, on Eliquis and propanolol 10 mg twice daily at home.  Eliquis on hold due to AKI, cardiology on board defer anticoagulation to cardio-has advised "to resume Eliquis once renal function stabilizes but if going to be off Eliquis for extended period then estart heparin drip".  Recent low back pain MRI on 4/11 showed multilevel marrow edema potentially source of pain, superimposed recent L4 compression fracture is possible. Closed compression fracture of L4 lumbar vertebra, initial encounter: Pain management.patient had outpatient MRI done by Charlene. Cleophas Dickerson. ? possible kyphoplasty consideration once medically stable, discussed Charlene Dickerson and advised to speak w Charlene Dickerson-  And discussed with him, IR will evaluate for possible kyphoplasty.  CAD/inferior ST elevation MI 12/19 c/b CHB: on plavix/statin at home.  Troponin flat EKG without ischemia.  Dehydration:Continue IV fluid hydration.  Chronic diastolic CHF -currently dehydrated needing IV fluids.  Monitor  Essential hypertension: bp stable, monitor  DVT prophylaxis:Heparin Code Status:Full  Family Communication:Plan of care discussed with patient and daughter at bedside  previously, daughter not at bedside today.  Status is: Inpatient Remains inpatient appropriate because:Persistent severe electrolyte disturbances and Inpatient level of care appropriate due to severity of illness  Dispo: The patient is from: Home              Anticipated d/c  is to: SNF-pending PT OT evaluation              Anticipated d/c date is: > 3  days              Patient currently is not medically stable to d/c. Nutrition: Diet Order            Diet Heart Room service appropriate? Yes; Fluid consistency: Thin  Diet effective now            Consultants:Nephro Procedures: HD -emergent TTE 4/19.21 1. Left ventricular ejection fraction, by estimation, is 60 to 65%. The  left ventricle has normal function. The left ventricle has no regional  wall motion abnormalities. Left ventricular diastolic parameters are  consistent with Grade I diastolic  dysfunction (impaired relaxation).  2. Right ventricular systolic function is normal. The right ventricular  size is normal. There is mildly elevated pulmonary artery systolic  pressure. The estimated right ventricular systolic pressure is 32.4 mmHg.  3. The mitral valve is normal in structure. No evidence of mitral valve  regurgitation. No evidence of mitral stenosis.  4. Tricuspid valve regurgitation is moderate.  5. The aortic valve is normal in structure. Aortic valve regurgitation is  moderate. No aortic stenosis is present.  6. The inferior vena cava is normal in size with greater than 50%  respiratory variability, suggesting   Microbiology:see note  Medications: Scheduled Meds: . atorvastatin  40 mg Oral q1800  . Chlorhexidine Gluconate Cloth  6 each Topical Q0600  . clopidogrel  75 mg Oral Daily  . docusate sodium  100 mg Oral BID  . feeding supplement (ENSURE ENLIVE)  237 mL Oral BID BM  . latanoprost  1 drop Both Eyes QHS  . multivitamin with minerals  1 tablet Oral Daily  . propranolol  10 mg Oral BID  . sodium chloride flush  3 mL Intravenous Q12H  . sodium zirconium cyclosilicate  10 g Oral Daily   Continuous Infusions: . sodium chloride 50 mL/hr at 06/21/19 0200  . sodium chloride    . sodium chloride    . cefTRIAXone (ROCEPHIN)  IV Stopped (06/20/19 2257)    Antimicrobials: Anti-infectives (From admission, onward)   Start     Dose/Rate Route  Frequency Ordered Stop   06/20/19 2300  cefTRIAXone (ROCEPHIN) 1 g in sodium chloride 0.9 % 100 mL IVPB     1 g 200 mL/hr over 30 Minutes Intravenous Every 24 hours 06/20/19 0043     06/19/19 2330  cefTRIAXone (ROCEPHIN) 1 g in sodium chloride 0.9 % 100 mL IVPB     1 g 200 mL/hr over 30 Minutes Intravenous  Once 06/19/19 2329 06/20/19 0058       Objective: Vitals: Today's Vitals   06/21/19 0315 06/21/19 0400 06/21/19 0410 06/21/19 0800  BP:  (!) 110/47  102/60  Pulse:  68  77  Resp:  15  16  Temp:  97.6 F (36.4 C)  97.7 F (36.5 C)  TempSrc:  Oral  Oral  SpO2:  98%  93%  Weight:   66.1 kg   Height:    (1.575 m)   PainSc: Asleep       Intake/Output Summary (Last 24 hours) at 06/21/2019 0948 Last data filed at 06/21/2019 0900 Gross per 24 hour  Intake 4306.15 ml  Output 1350 ml  Net 2956.15 ml   Filed Weights   06/20/19 0421 06/21/19 0410  Weight: 66.5 kg 66.1 kg   Weight change: -0.4 kg  Intake/Output from previous day: 04/19 0701 - 04/20 0700 In: 4351.2 [P.O.:570; I.V.:2681; IV Piggyback:1100.1] Out: 1350 [Urine:1350] Intake/Output this shift: Total I/O In: 75 [I.V.:75] Out: -   Examination:  General exam: Alert awake elderly, not in acute distress appears pleasant.   HEENT:Oral mucosa moist, Ear/Nose WNL grossly,dentition normal. Respiratory system: bilaterally clear,no wheezing or crackles,no use of accessory muscle, non tender. Cardiovascular system: S1 & S2 +, regular, No JVD. Gastrointestinal system: Abdomen soft, NT,ND, BS+. Nervous System:Alert, awake, moving extremities and grossly nonfocal Extremities: No edema, distal peripheral pulses palpable.  Skin: No rashes,no icterus. MSK: Normal muscle bulk,tone, power Fully catheter in place.  Data Reviewed: I have personally reviewed following labs and imaging studies CBC: Recent Labs  Lab 06/19/19 2009 06/19/19 2009 06/20/19 0125 06/20/19 0301 06/20/19 0442 06/20/19 0850 06/21/19 0246    WBC 11.2*  --   --  8.0 12.6* 9.5 8.0  NEUTROABS 9.1*  --   --   --  10.1* 7.7  --   HGB 15.3*   < > 12.9 14.2 13.8 12.8 11.2*  HCT 46.9*   < > 38.0 40.8 40.2 36.6 32.9*  MCV 102.6*  --   --  99.3 98.0 94.8 96.5  PLT 269  --   --  227 249 215 162   < > = values in this interval not displayed.   Basic Metabolic Panel: Recent Labs  Lab 06/20/19 0442 06/20/19 0442 06/20/19 0850 06/20/19 0850 06/20/19 1321 06/20/19 1602 06/20/19 2033 06/21/19 0002 06/21/19 0246  NA 135   < > 135   < > 135 136 137 140 140  K 5.4*   < > 4.5   < > 4.4 4.5 4.2 3.8 3.5  CL 101   < > 99   < > 96* 98 101 100 100  CO2 17*   < > 22   < > 26 28 28 28  33*  GLUCOSE 174*   < > 199*   < > 213* 190* 135* 121* 112*  BUN 160*   < > 80*   < > 83* 85* 78* 71* 65*  CREATININE 5.40*   < > 3.27*   < > 3.57* 3.36* 2.66* 2.27* 1.95*  CALCIUM 12.2*   < > 10.0   < > 9.2 9.0 8.4* 8.5* 8.4*  MG 2.6*  --  2.0  --   --   --   --   --   --   PHOS  --    < > 4.4  --  4.4 4.1 3.8 3.3  --    < > = values in this interval not displayed.   GFR: Estimated Creatinine Clearance: 19.2 mL/min (A) (by C-G formula based on SCr of 1.95 mg/dL (H)). Liver Function Tests: Recent Labs  Lab 06/19/19 2130 06/20/19 0301 06/20/19 0442 06/20/19 0442 06/20/19 0850 06/20/19 0850 06/20/19 1321 06/20/19 1602 06/20/19 2033 06/21/19 0002 06/21/19 0246  AST 21  --  22  --  21  --   --   --   --   --  17  ALT 20  --  21  --  20  --   --   --   --   --  18  ALKPHOS 47  --  44  --  43  --   --   --   --   --  35*  BILITOT 0.6  --  0.8  --  1.2  --   --   --   --   --  0.9  PROT 7.6  --  6.6  --  6.1*  --   --   --   --   --  5.3*  ALBUMIN 4.4   < > 3.6   < > 3.3*   < > 3.0* 3.0* 2.7* 2.8* 2.8*   < > = values in this interval not displayed.   Recent Labs  Lab 06/19/19 2009  LIPASE 105*   No results for input(s): AMMONIA in the last 168 hours. Coagulation Profile: No results for input(s): INR, PROTIME in the last 168 hours. Cardiac  Enzymes: Recent Labs  Lab 06/19/19 2130 06/20/19 0442  CKTOTAL 359* 309*  CKMB  --  9.8*   BNP (last 3 results) Recent Labs    07/08/18 1300 07/20/18 1345  PROBNP 2,604* 2,553*   HbA1C: No results for input(s): HGBA1C in the last 72 hours. CBG: Recent Labs  Lab 06/20/19 0039 06/20/19 0308 06/20/19 1704  GLUCAP 94 114* 146*   Lipid Profile: No results for input(s): CHOL, HDL, LDLCALC, TRIG, CHOLHDL, LDLDIRECT in the last 72 hours. Thyroid Function Tests: Recent Labs    06/20/19 0850  TSH 1.610   Anemia Panel: No results for input(s): VITAMINB12, FOLATE, FERRITIN, TIBC, IRON, RETICCTPCT in the last 72 hours. Sepsis Labs: Recent Labs  Lab 06/20/19 0301  LATICACIDVEN 1.4    Recent Results (from the past 240 hour(s))  Urine Culture     Status: Abnormal (Preliminary result)   Collection Time: 06/19/19 11:30 PM   Specimen: Urine, Random  Result Value Ref Range Status   Specimen Description   Final    URINE, RANDOM Performed at [place] [address] [place], [place] [address]    Special Requests   Final    NONE Performed at [facility], [place] [address] [place], [place] [address]    Culture (A)  Final    >=100,000 COLONIES/mL KLEBSIELLA PNEUMONIAE SUSCEPTIBILITIES TO FOLLOW Performed at [facility], [place] [address] [place], [place] [address]    Report Status PENDING  Incomplete  SARS CORONAVIRUS 2 (TAT 6-24 HRS) Nasopharyngeal Nasopharyngeal Swab     Status: None   Collection Time: 06/19/19 11:47 PM   Specimen: Nasopharyngeal Swab  Result Value Ref Range Status   SARS Coronavirus 2 NEGATIVE NEGATIVE Final    Comment: (NOTE) SARS-CoV-2 target nucleic acids are NOT DETECTED. The SARS-CoV-2 RNA is generally detectable in upper and lower respiratory specimens during the acute phase of infection. Negative results do not preclude SARS-CoV-2 infection, do not rule out co-infections with other pathogens, and  should not be used as the sole basis for treatment or other patient management decisions. Negative results must be combined with clinical observations, patient history, and epidemiological information. The expected result is Negative. Fact Sheet for Patients: SugarRoll.be Fact Sheet for Healthcare Providers: https://www.woods-mathews.com/ This test is not yet approved or cleared by the [place] FDA and  has been authorized for detection and/or diagnosis of SARS-CoV-2 by FDA under an Emergency Use Authorization (EUA). This EUA will remain  in effect (meaning this test can be used) for the duration of the COVID-19 declaration under Section 56 4(b)(1) of the Act, 21 U.S.C. section 360bbb-3(b)(1), unless the authorization is terminated or revoked sooner. Performed at [facility], [place] [address], [place], [place] [address]   MRSA PCR Screening     Status: None   Collection Time: 06/20/19  1:30 PM   Specimen: Nasal Mucosa; Nasopharyngeal  Result Value  Ref Range Status   MRSA by PCR NEGATIVE NEGATIVE Final    Comment:        The GeneXpert MRSA Assay (FDA approved for NASAL specimens only), is one component of a comprehensive MRSA colonization surveillance program. It is not intended to diagnose MRSA infection nor to guide or monitor treatment for MRSA infections. Performed at [facility], [address], [place], [place] [address]       Radiology Studies: CT ABDOMEN PELVIS WO CONTRAST  Result Date: 06/20/2019 CLINICAL DATA:  Lower back pain.  Pancreatitis suspected EXAM: CT ABDOMEN AND PELVIS WITHOUT CONTRAST TECHNIQUE: Multidetector CT imaging of the abdomen and pelvis was performed following the standard protocol without IV contrast. COMPARISON:  None. FINDINGS: Lower chest: Densely calcified visualized coronary arteries. Lung bases clear. No effusions. Hepatobiliary: No focal hepatic abnormality. Gallbladder  unremarkable. Pancreas: No focal abnormality or ductal dilatation. Spleen: No focal abnormality.  Normal size. Adrenals/Urinary Tract: 4.6 cm cyst in the midpole of the left kidney. No hydronephrosis. Adrenal glands and urinary bladder unremarkable. Stomach/Bowel: Diffuse colonic diverticulosis, most pronounced in the sigmoid colon. No active diverticulitis. No evidence of bowel obstruction. Vascular/Lymphatic: Aortic atherosclerosis. No enlarged abdominal or pelvic lymph nodes. Reproductive: Uterus and adnexa unremarkable.  No mass. Other: No free fluid or free air. Musculoskeletal: Moderate compression fracture through the superior endplate of L4 appears acute. Diffuse degenerative disc and facet disease. IMPRESSION: Diffuse colonic diverticulosis.  No active diverticulitis. Aortic atherosclerosis. Moderate L4 compression fracture, which appears acute. Coronary artery disease. Electronically Signed   By: Charlett Nose M.D.   On: 06/20/2019 00:05   DG Chest 2 View  Result Date: 06/19/2019 CLINICAL DATA:  Fatigue, generalized weakness. EXAM: CHEST - 2 VIEW COMPARISON:  Chest x-ray dated 02/20/2018. FINDINGS: Heart size and mediastinal contours are stable. Lungs are clear. No pleural effusion is seen. Osseous structures about the chest are unremarkable. IMPRESSION: No active cardiopulmonary disease. No evidence of pneumonia or pulmonary edema. Electronically Signed   By: Bary Richard M.D.   On: 06/19/2019 20:22   ECHOCARDIOGRAM COMPLETE  Result Date: 06/20/2019    ECHOCARDIOGRAM REPORT   Patient Name:   Charlene Dickerson Date of Exam: 06/20/2019 Medical Rec #:  [id number]      Height:       62.0 in Accession #:    [id number]     Weight:       146.6 lb Date of Birth:  [birth year]      BSA:          1.675 m Patient Age:    86 years       BP:           100/57 mmHg Patient Gender: F              HR:           79 bpm. Exam Location:  Inpatient Procedure: 2D Echo, Cardiac Doppler and Color Doppler Indications:     Ventricular tachycardia  History:        Patient has prior history of Echocardiogram examinations, most                 recent 02/19/2018. Acute MI, Arrythmias:Atrial Fibrillation;                 Risk Factors:Hypertension. AKI.  Sonographer:    Lavenia Atlas Referring Phys: [phone] ANASTASSIA DOUTOVA IMPRESSIONS  1. Left ventricular ejection fraction, by estimation, is 60 to 65%. The left ventricle has normal  function. The left ventricle has no regional wall motion abnormalities. Left ventricular diastolic parameters are consistent with Grade I diastolic dysfunction (impaired relaxation).  2. Right ventricular systolic function is normal. The right ventricular size is normal. There is mildly elevated pulmonary artery systolic pressure. The estimated right ventricular systolic pressure is 32.4 mmHg.  3. The mitral valve is normal in structure. No evidence of mitral valve regurgitation. No evidence of mitral stenosis.  4. Tricuspid valve regurgitation is moderate.  5. The aortic valve is normal in structure. Aortic valve regurgitation is moderate. No aortic stenosis is present.  6. The inferior vena cava is normal in size with greater than 50% respiratory variability, suggesting right atrial pressure of 3 mmHg. FINDINGS  Left Ventricle: Left ventricular ejection fraction, by estimation, is 60 to 65%. The left ventricle has normal function. The left ventricle has no regional wall motion abnormalities. The left ventricular internal cavity size was normal in size. There is  no left ventricular hypertrophy. Left ventricular diastolic parameters are consistent with Grade I diastolic dysfunction (impaired relaxation). Right Ventricle: The right ventricular size is normal. No increase in right ventricular wall thickness. Right ventricular systolic function is normal. There is mildly elevated pulmonary artery systolic pressure. The tricuspid regurgitant velocity is 2.71  m/s, and with an assumed right atrial pressure of 3  mmHg, the estimated right ventricular systolic pressure is 32.4 mmHg. Left Atrium: Left atrial size was normal in size. Right Atrium: Right atrial size was normal in size. Pericardium: There is no evidence of pericardial effusion. Mitral Valve: The mitral valve is normal in structure. Normal mobility of the mitral valve leaflets. Moderate mitral annular calcification. No evidence of mitral valve regurgitation. No evidence of mitral valve stenosis. Tricuspid Valve: The tricuspid valve is normal in structure. Tricuspid valve regurgitation is moderate . No evidence of tricuspid stenosis. Aortic Valve: The aortic valve is normal in structure. Aortic valve regurgitation is moderate. No aortic stenosis is present. Pulmonic Valve: The pulmonic valve was normal in structure. Pulmonic valve regurgitation is not visualized. No evidence of pulmonic stenosis. Aorta: The aortic root is normal in size and structure. Venous: The inferior vena cava is normal in size with greater than 50% respiratory variability, suggesting right atrial pressure of 3 mmHg. IAS/Shunts: No atrial level shunt detected by color flow Doppler.  LEFT VENTRICLE PLAX 2D LVIDd:         5.00 cm  Diastology LVIDs:         4.20 cm  LV e' lateral:   5.77 cm/s LV PW:         1.00 cm  LV E/e' lateral: 11.2 LV IVS:        0.80 cm  LV e' medial:    5.44 cm/s LVOT diam:     1.90 cm  LV E/e' medial:  11.9 LV SV:         51 LV SV Index:   31 LVOT Area:     2.84 cm  RIGHT VENTRICLE RV Basal diam:  2.60 cm RV S prime:     17.30 cm/s TAPSE (M-mode): 2.9 cm LEFT ATRIUM             Index       RIGHT ATRIUM           Index LA diam:        4.50 cm 2.69 cm/m  RA Area:     15.80 cm LA Vol (A2C):   58.3 ml 34.80 ml/m RA Volume:  40.20 ml  24.00 ml/m LA Vol (A4C):   56.6 ml 33.78 ml/m LA Biplane Vol: 61.3 ml 36.59 ml/m  AORTIC VALVE LVOT Vmax:   86.70 cm/s LVOT Vmean:  54.500 cm/s LVOT VTI:    0.181 m  AORTA Ao Root diam: 3.30 cm MITRAL VALVE                TRICUSPID VALVE  MV Area (PHT): 3.72 cm     TR Peak grad:   29.4 mmHg MV Decel Time: 204 msec     TR Vmax:        271.00 cm/s MV E velocity: 64.90 cm/s MV A velocity: 118.00 cm/s  SHUNTS MV E/A ratio:  0.55         Systemic VTI:  0.18 m                             Systemic Diam: 1.90 cm Donato Schultz MD Electronically signed by Donato Schultz MD Signature Date/Time: 06/20/2019/3:26:45 PM    Final      LOS: 1 day   Time spent: More than 50% of that time was spent in counseling and/or coordination of care.  Lanae Boast, MD [facility]  06/21/2019, 9:48 AM

## 2019-06-21 NOTE — Progress Notes (Signed)
Spoke with Freight forwarder via secure chat, she is aware of the DC HD cath for this patient

## 2019-06-22 ENCOUNTER — Inpatient Hospital Stay (HOSPITAL_COMMUNITY): Payer: Medicare Other

## 2019-06-22 LAB — PROTEIN ELECTROPHORESIS, SERUM
A/G Ratio: 1.3 (ref 0.7–1.7)
Albumin ELP: 3 g/dL (ref 2.9–4.4)
Alpha-1-Globulin: 0.2 g/dL (ref 0.0–0.4)
Alpha-2-Globulin: 0.6 g/dL (ref 0.4–1.0)
Beta Globulin: 0.9 g/dL (ref 0.7–1.3)
Gamma Globulin: 0.6 g/dL (ref 0.4–1.8)
Globulin, Total: 2.3 g/dL (ref 2.2–3.9)
Total Protein ELP: 5.3 g/dL — ABNORMAL LOW (ref 6.0–8.5)

## 2019-06-22 LAB — COMPREHENSIVE METABOLIC PANEL
ALT: 17 U/L (ref 0–44)
AST: 19 U/L (ref 15–41)
Albumin: 2.7 g/dL — ABNORMAL LOW (ref 3.5–5.0)
Alkaline Phosphatase: 39 U/L (ref 38–126)
Anion gap: 8 (ref 5–15)
BUN: 31 mg/dL — ABNORMAL HIGH (ref 8–23)
CO2: 34 mmol/L — ABNORMAL HIGH (ref 22–32)
Calcium: 8.9 mg/dL (ref 8.9–10.3)
Chloride: 102 mmol/L (ref 98–111)
Creatinine, Ser: 1.17 mg/dL — ABNORMAL HIGH (ref 0.44–1.00)
GFR calc Af Amer: 50 mL/min — ABNORMAL LOW (ref >60)
GFR calc non Af Amer: 43 mL/min — ABNORMAL LOW (ref >60)
Glucose, Bld: 88 mg/dL (ref 70–99)
Potassium: 4 mmol/L (ref 3.5–5.1)
Sodium: 144 mmol/L (ref 135–145)
Total Bilirubin: 0.5 mg/dL (ref 0.3–1.2)
Total Protein: 5.2 g/dL — ABNORMAL LOW (ref 6.5–8.1)

## 2019-06-22 LAB — CBC
HCT: 34.3 % — ABNORMAL LOW (ref 36.0–46.0)
Hemoglobin: 11.2 g/dL — ABNORMAL LOW (ref 12.0–15.0)
MCH: 32.7 pg (ref 26.0–34.0)
MCHC: 32.7 g/dL (ref 30.0–36.0)
MCV: 100 fL (ref 80.0–100.0)
Platelets: 148 10*3/uL — ABNORMAL LOW (ref 150–400)
RBC: 3.43 MIL/uL — ABNORMAL LOW (ref 3.87–5.11)
RDW: 12.5 % (ref 11.5–15.5)
WBC: 7.5 10*3/uL (ref 4.0–10.5)
nRBC: 0 % (ref 0.0–0.2)

## 2019-06-22 LAB — URINALYSIS, COMPLETE (UACMP) WITH MICROSCOPIC
Bilirubin Urine: NEGATIVE
Glucose, UA: NEGATIVE mg/dL
Ketones, ur: NEGATIVE mg/dL
Nitrite: NEGATIVE
Protein, ur: NEGATIVE mg/dL
Specific Gravity, Urine: 1.013 (ref 1.005–1.030)
pH: 8 (ref 5.0–8.0)

## 2019-06-22 LAB — URINE CULTURE: Culture: >=100000 — AB

## 2019-06-22 MED ORDER — PANTOPRAZOLE SODIUM 40 MG PO TBEC
40.0000 mg | DELAYED_RELEASE_TABLET | Freq: Every day | ORAL | Status: DC
  Administered 2019-06-22 – 2019-06-24 (×3): 40 mg via ORAL
  Filled 2019-06-22 (×3): qty 1

## 2019-06-22 MED ORDER — CLOPIDOGREL BISULFATE 75 MG PO TABS
75.0000 mg | ORAL_TABLET | Freq: Every day | ORAL | Status: DC
  Administered 2019-06-23: 75 mg via ORAL
  Filled 2019-06-22: qty 1

## 2019-06-22 MED ORDER — TRAMADOL HCL 50 MG PO TABS: 50.0000 mg | ORAL_TABLET | Freq: Four times a day (QID) | ORAL | Status: DC | PRN

## 2019-06-22 NOTE — Progress Notes (Signed)
Physical Therapy Treatment Patient Details Name: Charlene Dickerson MRN: [id number] DOB: 22-Nov-1932 Today's Date: 06/22/2019    History of Present Illness 86 yo admitted with fatigue and lower back pain pt with L4 fx, aKI with metabolic acidosis and severe hyperkalemia with episode of tachycardia in ED s/p shock. Pt transferred from [facility] to [facility] for urgent HD. PMhx: gERD, HTN, PAF, CAD    PT Comments    Patient is progressing with functional mobility and activity tolerance. Reviewed back precautions throughout session as pt demo poor carryover from previous sessions. She required minA for bed mobility to maintain back precautions. She required minA for initial power up to stand from EOB with RW and cues for proper sequencing and hand placement. She was able to ambulate ~155ft with RW and min guard for safety. No overt LOB noted. Spoke at length with daughter about recommendation for short-term SNF placement-- daughter agreeable that pt requires additional rehab as she and her sibling cannot provide the 24/7 care the pt would need upon d/c. Therefore, recommend short-term d/c to SNF to address deficits and allow the pt to return home independently and safely, once medically appropriate. Pt would continue to benefit from skilled physical therapy services at this time while admitted and after d/c to address the below listed limitations in order to improve overall safety and independence with functional mobility.   Follow Up Recommendations  SNF;Supervision/Assistance - 24 hour(spoke at length w/daughter about short-term SNF- agreeable)     Equipment Recommendations  Other (comment)(defer to next venue of care)    Recommendations for Other Services       Precautions / Restrictions Precautions Precautions: Back;Fall Precaution Comments: reviewed back precautions throughout as pt demo poor carryover    Mobility  Bed Mobility Overal bed mobility: Needs Assistance Bed Mobility: Rolling;Sidelying to  Sit;Sit to Sidelying Rolling: Min assist Sidelying to sit: Min assist     Sit to sidelying: Min assist General bed mobility comments: cues to maintain back precautions. minA to get trunk upright and LE management  Transfers Overall transfer level: Needs assistance Equipment used: Rolling walker (2 wheeled) Transfers: Sit to/from Stand Sit to Stand: Min assist         General transfer comment: minA for initial power up from low bed with RW- cues for proper RW sequencing and hand placement  Ambulation/Gait Ambulation/Gait assistance: Min guard Gait Distance (Feet): 100 Feet Assistive device: Rolling walker (2 wheeled) Gait Pattern/deviations: Step-through pattern Gait velocity: norm   General Gait Details: min guard for safety and line management. No overt LOB noted. Pt slightly impulsive with turns   Social research officer, government Rankin (Stroke Patients Only)       Balance Overall balance assessment: Needs assistance Sitting-balance support: No upper extremity supported;Feet supported Sitting balance-Leahy Scale: Good Sitting balance - Comments: able to sit EOB without support   Standing balance support: Bilateral upper extremity supported;During functional activity;No upper extremity supported Standing balance-Leahy Scale: Fair Standing balance comment: B UE support required during ambulation. Pt able to static stand without UE support                            Cognition Arousal/Alertness: Awake/alert Behavior During Therapy: WFL for tasks assessed/performed Overall Cognitive Status: Impaired/Different from baseline Area of Impairment: Memory;Problem solving;Awareness  Memory: Decreased recall of precautions;Decreased short-term memory     Awareness: Intellectual Problem Solving: Slow processing;Difficulty sequencing;Requires verbal cues General Comments: continuous verbal cues for back  precautions throughout session due to poor carryover.       Exercises General Exercises - Lower Extremity Hip Flexion/Marching: Strengthening;Both;10 reps;Standing;Other (comment)(with RW)    General Comments General comments (skin integrity, edema, etc.): Daughter- [name] present throughout session. VSS, SpO2 >95% throughout on RA      Pertinent Vitals/Pain Pain Assessment: Faces Faces Pain Scale: No hurt    Home Living                      Prior Function            PT Goals (current goals can now be found in the care plan section) Acute Rehab PT Goals PT Goal Formulation: With patient/family Time For Goal Achievement: 07/05/19 Potential to Achieve Goals: Fair    Frequency    Min 3X/week      PT Plan Current plan remains appropriate    Co-evaluation              AM-PAC PT "6 Clicks" Mobility   Outcome Measure  Help needed turning from your back to your side while in a flat bed without using bedrails?: A Little Help needed moving from lying on your back to sitting on the side of a flat bed without using bedrails?: A Little Help needed moving to and from a bed to a chair (including a wheelchair)?: A Little Help needed standing up from a chair using your arms (e.g., wheelchair or bedside chair)?: A Little Help needed to walk in hospital room?: A Little Help needed climbing 3-5 steps with a railing? : A Lot 6 Click Score: 17    End of Session Equipment Utilized During Treatment: Gait belt Activity Tolerance: Patient tolerated treatment well Patient left: in bed;with call bell/phone within reach;with bed alarm set;with family/visitor present Nurse Communication: Mobility status PT Visit Diagnosis: Other abnormalities of gait and mobility (R26.89);Difficulty in walking, not elsewhere classified (R26.2);Muscle weakness (generalized) (M62.81)     Time: 2595-6387 PT Time Calculation (min) (ACUTE ONLY): 23 min  Charges:  $Gait Training: (P) 8-22  mins $Therapeutic Activity: (P) 8-22 mins                     Savvy Peeters, SPT Acute Rehab  [phone]   Dorothia Passmore 06/22/2019, 4:16 PM

## 2019-06-22 NOTE — Progress Notes (Signed)
Inpatient Rehab Admissions Coordinator Note:   Per CSW request, pt was screened for CIR candidacy by Estill Dooms, PT, DPT.  At this time pt does not have the medical necessity to qualify for CIR; recommend defer to therapy recommendations for dispo.  Please contact me with questions.   Estill Dooms, PT, DPT [phone] 06/22/19 3:16 PM

## 2019-06-22 NOTE — TOC Initial Note (Addendum)
Transition of Care [facility]) - Initial/Assessment Note    Patient Details  Name: Charlene Dickerson MRN: [id number] Date of Birth: 01/21/1933  [facility]) CM/SW Contact:    Alberteen Sam, LCSW Phone Number: 06/22/2019, 2:01 PM  Clinical Narrative:                  CSW spoke with patient at bedside, patient agreeable for daughter [name] in room at bedside to engage in dc planning with patient. CSW informed patient and [name] regarding SNF recommendation made by PT and OT. Patient's facial expression appeared worried, [name] very supportive in educating patient on need to gain more mobility before going home alone. [name] reports she is also able to provide support for patient at home and assist in mobility.   [name] explained both of patient's parents passed away when she was a child and she grew up in orphanage institutions. She states patient views SNFs as going to another institution which makes her uncomfortable. [name] recognizes patient needs rehab, however impact of skilled nursing facility on patient's mental health may be negative.  CSW notes CIR may be safest dc plan for patient and her mental health based on the history of the patient growing up in an orphanage and SNF potentially being traumatic/triggering. Patient reports she would feel more comfortable staying in the hospital for rehab vs going to a SNF. Patient states she is very motivated to "do whatever it takes" to go home, with [name] as a support system.   CSW spoke with Urban Gibson with CIR, she will work on pre screening the patient for appropriateness for CIR.  Expected Discharge Plan: (TBD) Barriers to Discharge: Continued Medical Work up   Patient Goals and CMS Choice Patient states their goals for this hospitalization and ongoing recovery are:: to get better and go home CMS Medicare.gov Compare Post Acute Care list provided to:: Patient Choice offered to / list presented to : Patient  Expected Discharge Plan and  Services Expected Discharge Plan: (TBD)       Living arrangements for the past 2 months: Single Family Home                                      Prior Living Arrangements/Services Living arrangements for the past 2 months: Single Family Home Lives with:: Self Patient language and need for interpreter reviewed:: Yes Do you feel safe going back to the place where you live?: Yes      Need for Family Participation in Patient Care: Yes (Comment) Care giver support system in place?: Yes (comment)   Criminal Activity/Legal Involvement Pertinent to Current Situation/Hospitalization: No - Comment as needed  Activities of Daily Living Home Assistive Devices/Equipment: Gilford Rile (specify type) ADL Screening (condition at time of admission) Patient's cognitive ability adequate to safely complete daily activities?: Yes Is the patient deaf or have difficulty hearing?: No Does the patient have difficulty seeing, even when wearing glasses/contacts?: No Does the patient have difficulty concentrating, remembering, or making decisions?: No Patient able to express need for assistance with ADLs?: Yes Does the patient have difficulty dressing or bathing?: Yes Independently performs ADLs?: No Dressing (OT): Needs assistance Is this a change from baseline?: Change from baseline, expected to last <3days Grooming: Needs assistance Is this a change from baseline?: Change from baseline, expected to last <3 days Bathing: Needs assistance Is this a change from baseline?: Change from baseline, expected  to last <3 days In/Out Bed: Needs assistance Is this a change from baseline?: Change from baseline, expected to last >3 days Walks in Home: Independent with device (comment)(walker -- new since a week) Does the patient have difficulty walking or climbing stairs?: No Weakness of Legs: Both Weakness of Arms/Hands: None  Permission Sought/Granted Permission sought to share information with : Case  Manager, Magazine features editor, Family Supports Permission granted to share information with : Yes, Verbal Permission Granted  Share Information with NAME: [name]  Permission granted to share info w AGENCY: SNFs/CIR  Permission granted to share info w Relationship: daughter  Permission granted to share info w Contact Information: [phone]  Emotional Assessment Appearance:: Appears stated age Attitude/Demeanor/Rapport: Gracious Affect (typically observed): Calm Orientation: : Oriented to Self, Oriented to Place, Oriented to  Time, Oriented to Situation Alcohol / Substance Use: Not Applicable Psych Involvement: No (comment)  Admission diagnosis:  Hyperkalemia [E87.5] Acute cystitis without hematuria [N30.00] AKI (acute kidney injury) (HCC) [N17.9] Acute renal failure, unspecified acute renal failure type [facility]) [N17.9] Patient Active Problem List   Diagnosis Date Noted  . Malnutrition of moderate degree 06/21/2019  . AKI (acute kidney injury) (HCC) 06/20/2019  . Hyperkalemia 06/20/2019  . Metabolic acidosis 06/20/2019  . Uremia 06/20/2019  . Closed compression fracture of L4 lumbar vertebra, initial encounter (HCC) 06/20/2019  . CAD (coronary artery disease) 06/20/2019  . Dehydration 06/20/2019  . Chronic diastolic CHF (congestive heart failure) (HCC) 06/20/2019  . Essential hypertension 06/20/2019  . Low back pain 06/01/2019  . Paroxysmal atrial fibrillation (HCC)   . Persistent atrial fibrillation (HCC) 03/12/2018  . Shock circulatory (HCC)   . Hemodynamic instability   . Central venous catheter in place   . STEMI (ST elevation myocardial infarction) (HCC) 02/18/2018  . Acute myocardial infarction (HCC) 02/18/2018  . Complete heart block (HCC)   . Rectocele 11/26/2017  . Cystocele with rectocele 11/26/2017  . Diffuse cystic mastopathy of breast 05/09/2011   PCP:  Martha Clan, MD Pharmacy:   [facility] - [place], [place] - [address] [place] [address] Phone: [phone] Fax: [phone]  [id number] [place], [place] - [address] AT [facility] [address] [place] [address] Phone: [phone] Fax: [phone]     Social Determinants of Health (SDOH) Interventions    Readmission Risk Interventions No flowsheet data found.

## 2019-06-22 NOTE — Progress Notes (Signed)
PROGRESS NOTE    Charlene Dickerson  NTZ:[id number] DOB: [birth year] DOA: 06/19/2019 PCP: Martha Clan, MD   Brief Narrative: 86 year old female with history of GERD, hypertension, osteoporosis, PAF, CAD/MI s/p stent in 2019 December on Lasix, potassium supplementation at home, recently seeing orthopedics for low back pain and on Tylenol and Advil repeatedly throughout the day for past week, decreased p.o. intake presented with , fatigue x 1 wk.   In the ED, UA worrisome for UTI and lab with potassium 7.3, creatinine 5.8, peaked T waves, discussion nephrology status post temporizing measures.  IN ER patient went into wide tachy rhythm-received ca 2 amp,150 of Amiodarone,2 amps of bicarb Pt remained alert.She was given a synchronized shock and converted to sinus rhythm.Pt was briefly bagged Patient was transferred to [facility] had a HD catheter placed by EDP and underwent emergent dialysis during night with subsequent improvement/normalization of the potassium.    Subjective: Reporting left hip pain.  No acute complaint otherwise.  No nausea no vomiting.  Assessment & Plan:  Acute kidney injury W/ metabolic acidosis and hyperkalemia, uremia: Due to poor intake, NSAIDs for back pain, diuretics.  HD Catheter placed in ED, nephrology on board work-up in progress with ANA, complements, UP/C, SPEP, light chains, urine culture.status post emergent dialysis during night for high k 4.19> On ns and bicarb IV fluids: Stop IV fluids , HD cath discontinued on 06/21/2019  Foley catheter discontinued on 06/22/2019.  Recent Labs  Lab 06/20/19 1602 06/20/19 2033 06/21/19 0002 06/21/19 0246 06/22/19 0241  BUN 85* 78* 71* 65* 31*  CREATININE 3.36* 2.66* 2.27* 1.95* 1.17*   Severe Hyperkalemia, status post emergent dialysis-now normalized. Lokelma not given.  Wide complex Tachycardia/VT s/p shock in ED, s/p temporizing measures/amiod, in the setting of hyperkalemia. Cardiology eval appreciated suspect due to  elevated imbalance.TTE was done to evaluate EF-EF 60 to 60% normal, no Regional WMA.  Possible UTI: Culture growing Klebsiella.  Continue IV antibiotics.  Paroxysmal atrial fibrillation, on Eliquis and propanolol 10 mg twice daily at home.  Eliquis on hold due to AKI, cardiology on board defer anticoagulation to cardio-has advised "to resume Eliquis once renal function stabilizes but if going to be off Eliquis for extended period then estart heparin drip".  Recent low back pain MRI on 4/11 showed multilevel marrow edema potentially source of pain, superimposed recent L4 compression fracture is possible.  Closed compression fracture of L4 lumbar vertebra, initial encounter: Pain management.patient had outpatient MRI done by Dr. Cleophas Dunker. ? possible kyphoplasty consideration once medically stable, discussed Dr Cleophas Dunker and advised to speak w Dr Cleophas Dunker-  And discussed with him, Patient has to be off of Plavix for 5 days and therapeutic anticoagulation for 48 hours before performing kyphoplasty.  Tentatively scheduled for Monday is anticipating patient will be stable enough to be discharged from the hospital before that. Initiate tramadol for pain control and monitor response.  CAD/inferior ST elevation MI 12/19 c/b CHB: on plavix/statin at home.  Troponin flat EKG without ischemia.  Dehydration:Continue IV fluid hydration.  Chronic diastolic CHF -currently dehydrated needing IV fluids.  Monitor  Essential hypertension: bp stable, monitor  DVT prophylaxis:Heparin Code Status:Full  Family Communication:Plan of care discussed with patient and son at bedside    Status is: Inpatient Remains inpatient appropriate because:Persistent severe electrolyte disturbances and Inpatient level of care appropriate due to severity of illness  Dispo: The patient is from: Home              Anticipated d/c is  to: SNF-pending insurance authorization and medical stabilization              Anticipated d/c date  is: 1 to 2 days.              Patient currently is not medically stable to d/c. Nutrition: Diet Order            Diet NPO time specified Except for: Sips with Meds  Diet effective midnight        Diet Heart Room service appropriate? Yes; Fluid consistency: Thin  Diet effective now            Consultants:Nephro Procedures: HD -emergent TTE 4/19.21 1. Left ventricular ejection fraction, by estimation, is 60 to 65%. The  left ventricle has normal function. The left ventricle has no regional  wall motion abnormalities. Left ventricular diastolic parameters are  consistent with Grade I diastolic  dysfunction (impaired relaxation).  2. Right ventricular systolic function is normal. The right ventricular  size is normal. There is mildly elevated pulmonary artery systolic  pressure. The estimated right ventricular systolic pressure is 32.4 mmHg.  3. The mitral valve is normal in structure. No evidence of mitral valve  regurgitation. No evidence of mitral stenosis.  4. Tricuspid valve regurgitation is moderate.  5. The aortic valve is normal in structure. Aortic valve regurgitation is  moderate. No aortic stenosis is present.  6. The inferior vena cava is normal in size with greater than 50%  respiratory variability, suggesting   Microbiology:see note  Medications: Scheduled Meds: . atorvastatin  40 mg Oral q1800  . [START ON 06/23/2019] clopidogrel  75 mg Oral Daily  . docusate sodium  100 mg Oral BID  . feeding supplement (ENSURE ENLIVE)  237 mL Oral BID BM  . latanoprost  1 drop Both Eyes QHS  . multivitamin with minerals  1 tablet Oral Daily  . pantoprazole  40 mg Oral Daily  . propranolol  10 mg Oral BID  . sodium chloride flush  3 mL Intravenous Q12H   Continuous Infusions: . cefTRIAXone (ROCEPHIN)  IV Stopped (06/21/19 2314)    Antimicrobials: Anti-infectives (From admission, onward)   Start     Dose/Rate Route Frequency Ordered Stop   06/20/19 2300  cefTRIAXone  (ROCEPHIN) 1 g in sodium chloride 0.9 % 100 mL IVPB     1 g 200 mL/hr over 30 Minutes Intravenous Every 24 hours 06/20/19 0043     06/19/19 2330  cefTRIAXone (ROCEPHIN) 1 g in sodium chloride 0.9 % 100 mL IVPB     1 g 200 mL/hr over 30 Minutes Intravenous  Once 06/19/19 2329 06/20/19 0058       Objective: Vitals: Today's Vitals   06/22/19 1200 06/22/19 1604 06/22/19 1930 06/22/19 1939  BP:  124/63  137/89  Pulse: 76 68  77  Resp:  19  16  Temp: 98.7 F (37.1 C) 98.7 F (37.1 C)  98.3 F (36.8 C)  TempSrc: Oral Oral  Oral  SpO2: 98% 91%  96%  Weight:      Height:      PainSc: 0-No pain 0-No pain 0-No pain     Intake/Output Summary (Last 24 hours) at 06/22/2019 2021 Last data filed at 06/22/2019 1940 Gross per 24 hour  Intake 822 ml  Output 900 ml  Net -78 ml   Filed Weights   06/20/19 0421 06/21/19 0410  Weight: 66.5 kg 66.1 kg   Weight change:    Intake/Output from previous day: 04/20 0701 -  04/21 0700 In: 1093.9 [P.O.:780; I.V.:213.9; IV Piggyback:100] Out: 1000 [Urine:1000] Intake/Output this shift: Total I/O In: -  Out: 100 [Urine:100]  Examination:  General: Appear in mild distress, no Rash; Oral Mucosa Clear, moist. no Abnormal Neck Mass Or lumps, Conjunctiva normal  Cardiovascular: S1 and S2 Present, no Murmur, Respiratory: normal respiratory effort, Bilateral Air entry present and Clear to Auscultation, no Crackles, no wheezes Abdomen: Bowel Sound present, Soft and mild tenderness, no hernia Extremities: no Pedal edema, no calf tenderness Neurology: alert and oriented to time, place, and person affect appropriate.    Data Reviewed: I have personally reviewed following labs and imaging studies CBC: Recent Labs  Lab 06/19/19 2009 06/20/19 0125 06/20/19 0301 06/20/19 0442 06/20/19 0850 06/21/19 0246 06/22/19 0241  WBC 11.2*  --  8.0 12.6* 9.5 8.0 7.5  NEUTROABS 9.1*  --   --  10.1* 7.7  --   --   HGB 15.3*   < > 14.2 13.8 12.8 11.2* 11.2*    HCT 46.9*   < > 40.8 40.2 36.6 32.9* 34.3*  MCV 102.6*  --  99.3 98.0 94.8 96.5 100.0  PLT 269  --  227 249 215 162 148*   < > = values in this interval not displayed.   Basic Metabolic Panel: Recent Labs  Lab 06/20/19 0442 06/20/19 0442 06/20/19 0850 06/20/19 0850 06/20/19 1321 06/20/19 1321 06/20/19 1602 06/20/19 2033 06/21/19 0002 06/21/19 0246 06/22/19 0241  NA 135   < > 135   < > 135   < > 136 137 140 140 144  K 5.4*   < > 4.5   < > 4.4   < > 4.5 4.2 3.8 3.5 4.0  CL 101   < > 99   < > 96*   < > 98 101 100 100 102  CO2 17*   < > 22   < > 26   < > 28 28 28  33* 34*  GLUCOSE 174*   < > 199*   < > 213*   < > 190* 135* 121* 112* 88  BUN 160*   < > 80*   < > 83*   < > 85* 78* 71* 65* 31*  CREATININE 5.40*   < > 3.27*   < > 3.57*   < > 3.36* 2.66* 2.27* 1.95* 1.17*  CALCIUM 12.2*   < > 10.0   < > 9.2   < > 9.0 8.4* 8.5* 8.4* 8.9  MG 2.6*  --  2.0  --   --   --   --   --   --   --   --   PHOS  --    < > 4.4  --  4.4  --  4.1 3.8 3.3  --   --    < > = values in this interval not displayed.   GFR: Estimated Creatinine Clearance: 31.9 mL/min (A) (by C-G formula based on SCr of 1.17 mg/dL (H)). Liver Function Tests: Recent Labs  Lab 06/19/19 2130 06/20/19 0301 06/20/19 0442 06/20/19 0442 06/20/19 0850 06/20/19 1321 06/20/19 1602 06/20/19 2033 06/21/19 0002 06/21/19 0246 06/22/19 0241  AST 21  --  22  --  21  --   --   --   --  17 19  ALT 20  --  21  --  20  --   --   --   --  18 17  ALKPHOS 47  --  44  --  43  --   --   --   --  35* 39  BILITOT 0.6  --  0.8  --  1.2  --   --   --   --  0.9 0.5  PROT 7.6  --  6.6  --  6.1*  --   --   --   --  5.3* 5.2*  ALBUMIN 4.4   < > 3.6   < > 3.3*   < > 3.0* 2.7* 2.8* 2.8* 2.7*   < > = values in this interval not displayed.   Recent Labs  Lab 06/19/19 2009  LIPASE 105*   No results for input(s): AMMONIA in the last 168 hours. Coagulation Profile: No results for input(s): INR, PROTIME in the last 168 hours. Cardiac  Enzymes: Recent Labs  Lab 06/19/19 2130 06/20/19 0442  CKTOTAL 359* 309*  CKMB  --  9.8*   BNP (last 3 results) Recent Labs    07/08/18 1300 07/20/18 1345  PROBNP 2,604* 2,553*   HbA1C: No results for input(s): HGBA1C in the last 72 hours. CBG: Recent Labs  Lab 06/20/19 0039 06/20/19 0308 06/20/19 1704  GLUCAP 94 114* 146*   Lipid Profile: No results for input(s): CHOL, HDL, LDLCALC, TRIG, CHOLHDL, LDLDIRECT in the last 72 hours. Thyroid Function Tests: Recent Labs    06/20/19 0850  TSH 1.610   Anemia Panel: No results for input(s): VITAMINB12, FOLATE, FERRITIN, TIBC, IRON, RETICCTPCT in the last 72 hours. Sepsis Labs: Recent Labs  Lab 06/20/19 0301  LATICACIDVEN 1.4    Recent Results (from the past 240 hour(s))  Urine Culture     Status: Abnormal   Collection Time: 06/19/19 11:30 PM   Specimen: Urine, Random  Result Value Ref Range Status   Specimen Description   Final    URINE, RANDOM Performed at [facility], [address], [place], [place] [address]    Special Requests   Final    NONE Performed at [facility], [address], [place], [place] [address]    Culture >=100,000 COLONIES/mL KLEBSIELLA PNEUMONIAE (A)  Final   Report Status 06/22/2019 FINAL  Final   Organism ID, Bacteria KLEBSIELLA PNEUMONIAE (A)  Final      Susceptibility   Klebsiella pneumoniae - MIC*    AMPICILLIN >=32 RESISTANT Resistant     CEFAZOLIN <=4 SENSITIVE Sensitive     CEFTRIAXONE <=0.25 SENSITIVE Sensitive     CIPROFLOXACIN <=0.25 SENSITIVE Sensitive     GENTAMICIN <=1 SENSITIVE Sensitive     IMIPENEM <=0.25 SENSITIVE Sensitive     NITROFURANTOIN 128 RESISTANT Resistant     TRIMETH/SULFA <=20 SENSITIVE Sensitive     AMPICILLIN/SULBACTAM 8 SENSITIVE Sensitive     PIP/TAZO <=4 SENSITIVE Sensitive     * >=100,000 COLONIES/mL KLEBSIELLA PNEUMONIAE  SARS CORONAVIRUS 2 (TAT 6-24 HRS) Nasopharyngeal Nasopharyngeal Swab     Status: None    Collection Time: 06/19/19 11:47 PM   Specimen: Nasopharyngeal Swab  Result Value Ref Range Status   SARS Coronavirus 2 NEGATIVE NEGATIVE Final    Comment: (NOTE) SARS-CoV-2 target nucleic acids are NOT DETECTED. The SARS-CoV-2 RNA is generally detectable in upper and lower respiratory specimens during the acute phase of infection. Negative results do not preclude SARS-CoV-2 infection, do not rule out co-infections with other pathogens, and should not be used as the sole basis for treatment or other patient management decisions. Negative results must be combined with clinical observations, patient history, and epidemiological information. The expected result is Negative. Fact Sheet for Patients: HairSlick.no Fact Sheet for Healthcare Providers:  quierodirigir.com This test is not yet approved or cleared by the [place] and  has been authorized for detection and/or diagnosis of SARS-CoV-2 by FDA under an Emergency Use Authorization (EUA). This EUA will remain  in effect (meaning this test can be used) for the duration of the COVID-19 declaration under Section 56 4(b)(1) of the Act, 21 U.S.C. section 360bbb-3(b)(1), unless the authorization is terminated or revoked sooner. Performed at [facility], [address], [place], [place] [address]   MRSA PCR Screening     Status: None   Collection Time: 06/20/19  1:30 PM   Specimen: Nasal Mucosa; Nasopharyngeal  Result Value Ref Range Status   MRSA by PCR NEGATIVE NEGATIVE Final    Comment:        The GeneXpert MRSA Assay (FDA approved for NASAL specimens only), is one component of a comprehensive MRSA colonization surveillance program. It is not intended to diagnose MRSA infection nor to guide or monitor treatment for MRSA infections. Performed at [facility], [address], [place], [place] [address]       Radiology Studies: DG HIP UNILAT WITH  PELVIS 2-3 VIEWS LEFT  Result Date: 06/22/2019 CLINICAL DATA:  Pain in back, left hip EXAM: DG HIP (WITH OR WITHOUT PELVIS) 2-3V LEFT COMPARISON:  None. FINDINGS: Osteopenia. There is no evidence of hip fracture or dislocation. There is no evidence of arthropathy or other focal bone abnormality. IMPRESSION: Osteopenia. No no displaced fracture or dislocation of the left hip. Joint spaces are preserved. Electronically Signed   By: Lauralyn Primes M.D.   On: 06/22/2019 12:43     LOS: 2 days   Time spent: More than 50% of that time was spent in counseling and/or coordination of care.  Lynden Oxford, MD [facility]  06/22/2019, 8:21 PM

## 2019-06-22 NOTE — Consult Note (Signed)
[facility] Progress Note    Assessment/ Plan:   1. AKI, improving Creatinine continues to improve, today 1.17 from 1.95 yesterday.  1.35 L urine output yesterday.  Given patient's improvement back to nearly baseline, patient can continue to follow with her primary care provider without a follow-up with nephrology outpatient at this time.  Signing off  Patient should continue holding potassium supplements for now.  Should follow closely with primary care provider for BMPs.  2.  Elevated UPC EPC on arrival 1.14.C3, C4 wn.  Kappa lambda, ANA normal.  Follow-up SPEP  3. UTI >100 k GNR in culture.  Day 3 antibiotics . Per primary   4. Closed compression fracture L4  Osteoporosis  Dexa in 2003 osteoporotic with high fracture risk. Currently takes calcium supplementation  Avoid NSAIDs for pain   Rest per primary   Subjective:   No acute events overnight.  Doing well   Objective:   BP 125/70 (BP Location: Left Arm)   Pulse 73   Temp 97.8 F (36.6 C) (Oral)   Resp 17   Ht 5\' 2"  (1.575 m)   Wt 66.1 kg   SpO2 93%   BMI 26.65 kg/m   Intake/Output Summary (Last 24 hours) at 06/22/2019 1122 Last data filed at 06/22/2019 0244 Gross per 24 hour  Intake 818.88 ml  Output 1000 ml  Net -181.12 ml   Weight change:   Physical Exam: General: Patient appears well, no acute distress Neck: No JVD Chest: Irregularly irregular.  No lower extremity edema Lungs CTA B MSK: Well-perfused, moving spontaneously  Imaging: CTAP 06/20/19 Diffuse colonic diverticulosis.  No active diverticulitis. Aortic atherosclerosis. Moderate L4 compression fracture, which appears acute. Coronary artery disease.   4/18 CXR  No active cardiopulmonary disease. No evidence of pneumonia or pulmonary edema  ECHOCARDIOGRAM COMPLETE IMPRESSIONS   1. Left ventricular ejection fraction, by estimation, is 60 to 65%. The left ventricle has normal function. The left ventricle has no regional  wall motion abnormalities. Left ventricular diastolic parameters are consistent with Grade I diastolic dysfunction (impaired relaxation).   2. Right ventricular systolic function is normal. The right ventricular size is normal. There is mildly elevated pulmonary artery systolic pressure. The estimated right ventricular systolic pressure is 85.6 mmHg.   3. The mitral valve is normal in structure. No evidence of mitral valve regurgitation. No evidence of mitral stenosis.  4. Tricuspid valve regurgitation is moderate.   5. The aortic valve is normal in structure. Aortic valve regurgitation is moderate. No aortic stenosis is present.  6. The inferior vena cava is normal in size with greater than 50% respiratory variability, suggesting right atrial pressure of 3 mmHg.   Labs: BMET Recent Labs  Lab 06/20/19 0301 06/20/19 0442 06/20/19 0850 06/20/19 1321 06/20/19 1602 06/20/19 2033 06/21/19 0002 06/21/19 0246 06/22/19 0241  NA 137  137   < > 135 135 136 137 140 140 144  K 6.3*  6.3*   < > 4.5 4.4 4.5 4.2 3.8 3.5 4.0  CL 106  105   < > 99 96* 98 101 100 100 102  CO2 16*  16*   < > 22 26 28 28 28  33* 34*  GLUCOSE 113*  111*   < > 199* 213* 190* 135* 121* 112* 88  BUN 163*  163*   < > 80* 83* 85* 78* 71* 65* 31*  CREATININE 5.57*  5.27*   < > 3.27* 3.57* 3.36* 2.66* 2.27* 1.95* 1.17*  CALCIUM 12.7*  12.5*   < > 10.0 9.2 9.0 8.4* 8.5* 8.4* 8.9  PHOS 7.1*  --  4.4 4.4 4.1 3.8 3.3  --   --    < > = values in this interval not displayed.   CBC Recent Labs  Lab 06/19/19 2009 06/20/19 0125 06/20/19 0442 06/20/19 0850 06/21/19 0246 06/22/19 0241  WBC 11.2*   < > 12.6* 9.5 8.0 7.5  NEUTROABS 9.1*  --  10.1* 7.7  --   --   HGB 15.3*   < > 13.8 12.8 11.2* 11.2*  HCT 46.9*   < > 40.2 36.6 32.9* 34.3*  MCV 102.6*   < > 98.0 94.8 96.5 100.0  PLT 269   < > 249 215 162 148*   < > = values in this interval not displayed.    Medications:    . atorvastatin  40 mg Oral q1800  . [START  ON 06/23/2019] clopidogrel  75 mg Oral Daily  . docusate sodium  100 mg Oral BID  . feeding supplement (ENSURE ENLIVE)  237 mL Oral BID BM  . latanoprost  1 drop Both Eyes QHS  . multivitamin with minerals  1 tablet Oral Daily  . pantoprazole  40 mg Oral Daily  . propranolol  10 mg Oral BID  . sodium chloride flush  3 mL Intravenous Q12H    Genia Hotter, MD [facility] Resident, PGY2 06/22/2019, 11:22 AM

## 2019-06-22 NOTE — Progress Notes (Signed)
PT Progress Note for Charges    06/22/19 1600  PT Visit Information  Last PT Received On 06/22/19  PT General Charges  $$ ACUTE PT VISIT 1 Visit  PT Treatments  $Gait Training 8-22 mins  $Therapeutic Activity 8-22 mins  Arletta Bale, DPT  Acute Rehabilitation Services Pager [phone] Office [phone]

## 2019-06-22 NOTE — Consult Note (Signed)
Chief Complaint: Patient was seen in consultation today for L4 compression fracture/vertebral augmentation.  Referring Physician(s): Lanae Boast  Supervising Physician: Julieanne Cotton  Patient Status: [facility] - In-pt  History of Present Illness: Charlene Dickerson is a 86 y.o. female with a past medical history of hypertension, hyperlipidemia, HF, VT, MI s/p stenting 01/2018, paroxysmal atrial fibrillation, CAD, essential tremor, GERD, diverticulosis, rectocele, and osteoporosis. She has been admitted to [facility] since 06/19/2019 for management of VT and AKI. In addition, patient was complaining of back pain- she was evaluated as an outpatient with MR lumbar spine. Of note, patient was found to have UTI on admission. She was started on IV antibiotics 06/20/2019.  MR lumbar spine 06/12/2019: 1. Degenerative changes as detailed above. There is multilevel marrow edema, potentially a source of pain. Superimposed recent L4 compression fracture is possible, noting vertebral body height is similar to 06/01/2019 radiograph. There is no high-grade canal or left foraminal stenosis. Narrowing of the left lateral recess at L2-L3.  NIR consulted by Dr. Jonathon Bellows for possible image-guided L4 kyphoplasty/vertebroplasty. Patient awake and alert sitting in chair. Accompanied by son at bedside. Complains of low back pain, rated 6/10 at this time. States pain is constant. Denies fever, chills, chest pain, dyspnea, abdominal pain, or headache.  LD Plavix this AM.   Past Medical History:  Diagnosis Date  . Diverticulosis of colon   . Essential tremor   . GERD (gastroesophageal reflux disease)    occasional (no meds)  . History of basal cell carcinoma excision 2012  . Hypertension   . Osteoporosis   . PONV (postoperative nausea and vomiting)   . Rectocele    symptomatic    Past Surgical History:  Procedure Laterality Date  . BREAST EXCISIONAL BIOPSY Left 1995  . BREAST EXCISIONAL BIOPSY Right 1988  .  CARDIOVERSION N/A 04/16/2018   Procedure: CARDIOVERSION;  Surgeon: Parke Poisson, MD;  Location: [facility] ENDOSCOPY;  Service: Cardiovascular;  Laterality: N/A;  . CATARACT EXTRACTION W/ INTRAOCULAR LENS  IMPLANT, BILATERAL  right 09/ 2013;  left 10/ 2013  . COLONOSCOPY  last one 04/10/ 2012  . CORONARY STENT INTERVENTION N/A 02/18/2018   Procedure: CORONARY STENT INTERVENTION;  Surgeon: Runell Gess, MD;  Location: [facility] INVASIVE CV LAB;  Service: Cardiovascular;  Laterality: N/A;  . D & C HYSTEROSCOPY W/ RESECTION ENDOMETRIAL POLYP AND LESION  07-27-2007   dr Ambrose Mantle @[facility]   . LEFT HEART CATH AND CORONARY ANGIOGRAPHY N/A 02/18/2018   Procedure: LEFT HEART CATH AND CORONARY ANGIOGRAPHY;  Surgeon: 02/20/2018, MD;  Location: [facility] INVASIVE CV LAB;  Service: Cardiovascular;  Laterality: N/A;  . RECTOCELE REPAIR N/A 11/26/2017   Procedure: POSTERIOR REPAIR (RECTOCELE);  Surgeon: 11/28/2017, MD;  Location: [facility];  Service: Gynecology;  Laterality: N/A;  OUTPT IN BED  . TEMPORARY PACEMAKER N/A 02/18/2018   Procedure: TEMPORARY PACEMAKER;  Surgeon: 02/20/2018, MD;  Location: [facility] INVASIVE CV LAB;  Service: Cardiovascular;  Laterality: N/A;  . TUBAL LIGATION Bilateral yrs ago    Allergies: Clindamycin/lincomycin  Medications: Prior to Admission medications   Medication Sig Start Date End Date Taking? Authorizing Provider  acetaminophen (TYLENOL) 650 MG CR tablet Take 1,300 mg by mouth as needed for pain.   Yes [provider]  ALPRAZolam [facility]) 0.5 MG tablet Take 0.25-0.5 tablets by mouth 2 (two) times daily as needed for anxiety.  03/05/18  Yes [provider]  apixaban (ELIQUIS) 5 MG TABS tablet TAKE 1 TABLET(5 MG) BY  MOUTH TWICE DAILY Patient taking differently: Take 5 mg by mouth 2 (two) times daily.  06/13/19  Yes Nahser, Deloris Ping, MD  atorvastatin (LIPITOR) 40 MG tablet TAKE 1 TABLET(40 MG) BY MOUTH DAILY AT 6 PM Patient taking differently:  Take 40 mg by mouth daily at 6 PM.  04/26/19  Yes Nahser, Deloris Ping, MD  calcium citrate (CALCITRATE - DOSED IN MG ELEMENTAL CALCIUM) 950 MG tablet Take 200 mg of elemental calcium by mouth daily.   Yes [provider]  clopidogrel (PLAVIX) 75 MG tablet Take 1 tablet (75 mg total) by mouth daily. 06/09/19  Yes Nahser, Deloris Ping, MD  famotidine (PEPCID AC) 10 MG tablet Take 10 mg by mouth as needed for heartburn or indigestion.   Yes [provider]  furosemide (LASIX) 20 MG tablet Take 2 tablets (40 mg total) by mouth 2 (two) times daily. 10/20/18 10/20/19 Yes Weaver, Scott T, PA-C  latanoprost (XALATAN) 0.005 % ophthalmic solution Place 1 drop into both eyes at bedtime.  04/17/11  Yes [provider]  loratadine (CLARITIN) 10 MG tablet Take 10 mg by mouth daily.   Yes [provider]  Multiple Vitamin (MULTIVITAMIN WITH MINERALS) TABS tablet Take 1 tablet by mouth daily.   Yes [provider]  nitroGLYCERIN (NITROSTAT) 0.4 MG SL tablet Place 1 tablet (0.4 mg total) under the tongue every 5 (five) minutes as needed for chest pain. 02/24/18 06/20/19 Yes Simmons, Brittainy M, PA-C  pantoprazole (PROTONIX) 40 MG tablet TAKE 1 TABLET(40 MG) BY MOUTH AT BEDTIME Patient taking differently: Take 40 mg by mouth at bedtime as needed (acid reflux).  03/28/19  Yes Simmons, Brittainy M, PA-C  potassium chloride (KLOR-CON) 10 MEQ tablet TAKE 2 TABLETS BY MOUTH TWICE DAILY Patient taking differently: Take 20 mEq by mouth 2 (two) times daily.  04/19/19  Yes Nahser, Deloris Ping, MD  propranolol (INDERAL) 10 MG tablet Take 10 mg by mouth 2 (two) times daily. (2) 1 times daily    Yes [provider]  simethicone (MYLICON) 80 MG chewable tablet Chew 160 mg by mouth every 6 (six) hours as needed for flatulence.   Yes [provider]  Vitamin D, Ergocalciferol, (DRISDOL) 1.25 MG (50000 UT) CAPS capsule Take 50,000 Units by mouth every Wednesday.    Yes [provider]     Family History  Problem Relation Age of Onset  . Cancer Sister        Breast  . Heart disease Sister   . Breast cancer Sister   . Heart disease Father   . Cancer Daughter        breast  . Breast cancer Daughter   . Cancer Maternal Aunt        breast  . Breast cancer Maternal Aunt   . Cancer Maternal Grandmother        breast  . Breast cancer Maternal Grandmother     Social History   Socioeconomic History  . Marital status: Widowed    Spouse name: Not on file  . Number of children: Not on file  . Years of education: Not on file  . Highest education level: Not on file  Occupational History  . Not on file  Tobacco Use  . Smoking status: Former Smoker    Quit date: 05/08/1961    Years since quitting: 58.1  . Smokeless tobacco: Never Used  Substance and Sexual Activity  . Alcohol use: No  . Drug use: Never  . Sexual activity:  Not on file  Other Topics Concern  . Not on file  Social History Narrative  . Not on file   Social Determinants of Health   Financial Resource Strain:   . Difficulty of Paying Living Expenses:   Food Insecurity:   . Worried About Programme researcher, broadcasting/film/video in the Last Year:   . Barista in the Last Year:   Transportation Needs:   . Freight forwarder (Medical):   Marland Kitchen Lack of Transportation (Non-Medical):   Physical Activity:   . Days of Exercise per Week:   . Minutes of Exercise per Session:   Stress:   . Feeling of Stress :   Social Connections:   . Frequency of Communication with Friends and Family:   . Frequency of Social Gatherings with Friends and Family:   . Attends Religious Services:   . Active Member of Clubs or Organizations:   . Attends Banker Meetings:   Marland Kitchen Marital Status:      Review of Systems: A 12 point ROS discussed and pertinent positives are indicated in the HPI above.  All other systems are negative.  Review of Systems  Constitutional: Negative for chills and fever.  Respiratory:  Negative for shortness of breath and wheezing.   Cardiovascular: Negative for chest pain and palpitations.  Gastrointestinal: Negative for abdominal pain.  Musculoskeletal: Positive for back pain.  Neurological: Negative for headaches.  Psychiatric/Behavioral: Negative for behavioral problems and confusion.    Vital Signs: BP 125/70 (BP Location: Left Arm)   Pulse 73   Temp 97.8 F (36.6 C) (Oral)   Resp 17   Ht 5\' 2"  (1.575 m)   Wt 145 lb 11.6 oz (66.1 kg)   SpO2 93%   BMI 26.65 kg/m   Physical Exam Vitals and nursing note reviewed.  Constitutional:      General: She is not in acute distress.    Appearance: Normal appearance.  Cardiovascular:     Rate and Rhythm: Normal rate and regular rhythm.     Heart sounds: Normal heart sounds. No murmur.  Pulmonary:     Effort: Pulmonary effort is normal. No respiratory distress.     Breath sounds: Normal breath sounds. No wheezing.  Musculoskeletal:     Comments: Mild-moderate midline low back tenderness.  Skin:    General: Skin is warm and dry.  Neurological:     Mental Status: She is alert and oriented to person, place, and time.  Psychiatric:        Mood and Affect: Mood normal.        Behavior: Behavior normal.      MD Evaluation Airway: WNL Heart: WNL Abdomen: WNL Chest/ Lungs: WNL ASA  Classification: 3 Mallampati/Airway Score: Two   Imaging: CT ABDOMEN PELVIS WO CONTRAST  Result Date: 06/20/2019 CLINICAL DATA:  Lower back pain.  Pancreatitis suspected EXAM: CT ABDOMEN AND PELVIS WITHOUT CONTRAST TECHNIQUE: Multidetector CT imaging of the abdomen and pelvis was performed following the standard protocol without IV contrast. COMPARISON:  None. FINDINGS: Lower chest: Densely calcified visualized coronary arteries. Lung bases clear. No effusions. Hepatobiliary: No focal hepatic abnormality. Gallbladder unremarkable. Pancreas: No focal abnormality or ductal dilatation. Spleen: No focal abnormality.  Normal size.  Adrenals/Urinary Tract: 4.6 cm cyst in the midpole of the left kidney. No hydronephrosis. Adrenal glands and urinary bladder unremarkable. Stomach/Bowel: Diffuse colonic diverticulosis, most pronounced in the sigmoid colon. No active diverticulitis. No evidence of bowel obstruction. Vascular/Lymphatic: Aortic atherosclerosis. No enlarged abdominal or  pelvic lymph nodes. Reproductive: Uterus and adnexa unremarkable.  No mass. Other: No free fluid or free air. Musculoskeletal: Moderate compression fracture through the superior endplate of L4 appears acute. Diffuse degenerative disc and facet disease. IMPRESSION: Diffuse colonic diverticulosis.  No active diverticulitis. Aortic atherosclerosis. Moderate L4 compression fracture, which appears acute. Coronary artery disease. Electronically Signed   By: Rolm Baptise M.D.   On: 06/20/2019 00:05   DG Chest 2 View  Result Date: 06/19/2019 CLINICAL DATA:  Fatigue, generalized weakness. EXAM: CHEST - 2 VIEW COMPARISON:  Chest x-ray dated 02/20/2018. FINDINGS: Heart size and mediastinal contours are stable. Lungs are clear. No pleural effusion is seen. Osseous structures about the chest are unremarkable. IMPRESSION: No active cardiopulmonary disease. No evidence of pneumonia or pulmonary edema. Electronically Signed   By: Franki Cabot M.D.   On: 06/19/2019 20:22   MR Lumbar Spine w/o contrast  Result Date: 06/13/2019 CLINICAL DATA:  Low back pain going to left groin EXAM: MRI LUMBAR SPINE WITHOUT CONTRAST TECHNIQUE: Multiplanar, multisequence MR imaging of the lumbar spine was performed. No intravenous contrast was administered. COMPARISON:  Lumbar spine radiograph 06/01/2019 FINDINGS: Segmentation:  Standard. Alignment:  Trace retrolisthesis at L2-L3. Vertebrae: Mild loss of height at the superior endplate of L4 appear similar to 06/01/2019 radiograph. Marrow edema is present at L4. There is degenerative endplate marrow edema at L2-L3, L3-L4, and L5-S1. Minor loss of  height at the superior endplate of L2 without underlying marrow edema. There is no suspicious osseous lesion. Conus medullaris and cauda equina: Conus extends to the L1-L2 level. Conus and cauda equina appear normal. Paraspinal and other soft tissues: Partially imaged T2 hyperintense lesion of the left kidney statistically likely reflects a cyst. Disc levels: L1-L2:  Trace disc bulge.  No canal or foraminal stenosis. L2-L3: Mild disc bulge slightly eccentric to the left and facet arthropathy with left joint effusion. No canal stenosis. Partial effacement of the left lateral recess. No foraminal stenosis. L3-L4: Disc bulge and superimposed left foraminal protrusion with endplate osteophytic ridging. Mild facet arthropathy with ligamentum flavum infolding. No canal or right foraminal stenosis. Mild left foraminal stenosis. L4-L5: Disc bulge with endplate osteophytic ridging and moderate facet arthropathy with ligamentum flavum infolding. Minor canal stenosis with slight effacement of the lateral recesses. Mild right foraminal stenosis. No left foraminal stenosis. L5-S1: Disc bulge with endplate osteophytic ridging and mild facet arthropathy. No canal or left foraminal stenosis. Minor right foraminal stenosis. Far lateral component of disc bulge contacts the extraforaminal right L5 nerve root. IMPRESSION: Degenerative changes as detailed above. There is multilevel marrow edema, potentially a source of pain. Superimposed recent L4 compression fracture is possible, noting vertebral body height is similar to 06/01/2019 radiograph. There is no high-grade canal or left foraminal stenosis. Narrowing of the left lateral recess at L2-L3. Electronically Signed   By: Macy Mis M.D.   On: 06/13/2019 08:33   ECHOCARDIOGRAM COMPLETE  Result Date: 06/20/2019    ECHOCARDIOGRAM REPORT   Patient Name:   Charlene Dickerson Date of Exam: 06/20/2019 Medical Rec #:  [id number]      Height:       62.0 in Accession #:    [id number]      Weight:       146.6 lb Date of Birth:  1933-01-07      BSA:          1.675 m Patient Age:    6 years       BP:  100/57 mmHg Patient Gender: F              HR:           79 bpm. Exam Location:  Inpatient Procedure: 2D Echo, Cardiac Doppler and Color Doppler Indications:    Ventricular tachycardia  History:        Patient has prior history of Echocardiogram examinations, most                 recent 02/19/2018. Acute MI, Arrythmias:Atrial Fibrillation;                 Risk Factors:Hypertension. AKI.  Sonographer:    Lavenia Atlas Referring Phys: [phone] ANASTASSIA DOUTOVA IMPRESSIONS  1. Left ventricular ejection fraction, by estimation, is 60 to 65%. The left ventricle has normal function. The left ventricle has no regional wall motion abnormalities. Left ventricular diastolic parameters are consistent with Grade I diastolic dysfunction (impaired relaxation).  2. Right ventricular systolic function is normal. The right ventricular size is normal. There is mildly elevated pulmonary artery systolic pressure. The estimated right ventricular systolic pressure is 32.4 mmHg.  3. The mitral valve is normal in structure. No evidence of mitral valve regurgitation. No evidence of mitral stenosis.  4. Tricuspid valve regurgitation is moderate.  5. The aortic valve is normal in structure. Aortic valve regurgitation is moderate. No aortic stenosis is present.  6. The inferior vena cava is normal in size with greater than 50% respiratory variability, suggesting right atrial pressure of 3 mmHg. FINDINGS  Left Ventricle: Left ventricular ejection fraction, by estimation, is 60 to 65%. The left ventricle has normal function. The left ventricle has no regional wall motion abnormalities. The left ventricular internal cavity size was normal in size. There is  no left ventricular hypertrophy. Left ventricular diastolic parameters are consistent with Grade I diastolic dysfunction (impaired relaxation). Right Ventricle: The right  ventricular size is normal. No increase in right ventricular wall thickness. Right ventricular systolic function is normal. There is mildly elevated pulmonary artery systolic pressure. The tricuspid regurgitant velocity is 2.71  m/s, and with an assumed right atrial pressure of 3 mmHg, the estimated right ventricular systolic pressure is 32.4 mmHg. Left Atrium: Left atrial size was normal in size. Right Atrium: Right atrial size was normal in size. Pericardium: There is no evidence of pericardial effusion. Mitral Valve: The mitral valve is normal in structure. Normal mobility of the mitral valve leaflets. Moderate mitral annular calcification. No evidence of mitral valve regurgitation. No evidence of mitral valve stenosis. Tricuspid Valve: The tricuspid valve is normal in structure. Tricuspid valve regurgitation is moderate . No evidence of tricuspid stenosis. Aortic Valve: The aortic valve is normal in structure. Aortic valve regurgitation is moderate. No aortic stenosis is present. Pulmonic Valve: The pulmonic valve was normal in structure. Pulmonic valve regurgitation is not visualized. No evidence of pulmonic stenosis. Aorta: The aortic root is normal in size and structure. Venous: The inferior vena cava is normal in size with greater than 50% respiratory variability, suggesting right atrial pressure of 3 mmHg. IAS/Shunts: No atrial level shunt detected by color flow Doppler.  LEFT VENTRICLE PLAX 2D LVIDd:         5.00 cm  Diastology LVIDs:         4.20 cm  LV e' lateral:   5.77 cm/s LV PW:         1.00 cm  LV E/e' lateral: 11.2 LV IVS:        0.80 cm  LV e' medial:    5.44 cm/s LVOT diam:     1.90 cm  LV E/e' medial:  11.9 LV SV:         51 LV SV Index:   31 LVOT Area:     2.84 cm  RIGHT VENTRICLE RV Basal diam:  2.60 cm RV S prime:     17.30 cm/s TAPSE (M-mode): 2.9 cm LEFT ATRIUM             Index       RIGHT ATRIUM           Index LA diam:        4.50 cm 2.69 cm/m  RA Area:     15.80 cm LA Vol (A2C):    58.3 ml 34.80 ml/m RA Volume:   40.20 ml  24.00 ml/m LA Vol (A4C):   56.6 ml 33.78 ml/m LA Biplane Vol: 61.3 ml 36.59 ml/m  AORTIC VALVE LVOT Vmax:   86.70 cm/s LVOT Vmean:  54.500 cm/s LVOT VTI:    0.181 m  AORTA Ao Root diam: 3.30 cm MITRAL VALVE                TRICUSPID VALVE MV Area (PHT): 3.72 cm     TR Peak grad:   29.4 mmHg MV Decel Time: 204 msec     TR Vmax:        271.00 cm/s MV E velocity: 64.90 cm/s MV A velocity: 118.00 cm/s  SHUNTS MV E/A ratio:  0.55         Systemic VTI:  0.18 m                             Systemic Diam: 1.90 cm Donato Schultz MD Electronically signed by Donato Schultz MD Signature Date/Time: 06/20/2019/3:26:45 PM    Final    XR Lumbar Spine 2-3 Views  Result Date: 06/01/2019 His lumbar spine obtained in several projections. There is a degenerative scoliosis of about 10 degrees to the right. There are degenerative changes throughout the lumbar spine and narrowing of the L5-S1 disc space. May be slight anterior listhesis of L5 on S1. Diffuse calcification of the abdominal aorta but without obvious aneurysmal dilatation. No obvious compression fracture   Labs:  CBC: Recent Labs    06/20/19 0442 06/20/19 0850 06/21/19 0246 06/22/19 0241  WBC 12.6* 9.5 8.0 7.5  HGB 13.8 12.8 11.2* 11.2*  HCT 40.2 36.6 32.9* 34.3*  PLT 249 215 162 148*    COAGS: No results for input(s): INR, APTT in the last 8760 hours.  BMP: Recent Labs    06/20/19 2033 06/21/19 0002 06/21/19 0246 06/22/19 0241  NA 137 140 140 144  K 4.2 3.8 3.5 4.0  CL 101 100 100 102  CO2 28 28 33* 34*  GLUCOSE 135* 121* 112* 88  BUN 78* 71* 65* 31*  CALCIUM 8.4* 8.5* 8.4* 8.9  CREATININE 2.66* 2.27* 1.95* 1.17*  GFRNONAA 16* 19* 23* 43*  GFRAA 18* 22* 27* 50*    LIVER FUNCTION TESTS: Recent Labs    06/20/19 0442 06/20/19 0442 06/20/19 0850 06/20/19 1321 06/20/19 2033 06/21/19 0002 06/21/19 0246 06/22/19 0241  BILITOT 0.8  --  1.2  --   --   --  0.9 0.5  AST 22  --  21  --   --   --   17 19  ALT 21  --  20  --   --   --  18 17  ALKPHOS 44  --  43  --   --   --  35* 39  PROT 6.6  --  6.1*  --   --   --  5.3* 5.2*  ALBUMIN 3.6   < > 3.3*   < > 2.7* 2.8* 2.8* 2.7*   < > = values in this interval not displayed.     Assessment and Plan:  L4 compression fracture. Plan for image-guided L4 kyphoplasty/vertebroplasty tentatively for Monday 06/27/2019 pending improvement in UA. Patient's LD Plavix today- requires 5 day hold per IR protocol, will require cardiology clearance to hold Plavix. Dr. Allena Katz made aware earliest procedure can occur in IR is Monday 06/27/2019 if Plavix LD is today. Dr. Allena Katz also aware procedure can occur on outpatient basis and will require outpatient order upon discharge. Repeat UA ordered for today- must show at least improvement in UTI on IV antibiotics in order for procedure to occur. Patient will be NPO at midnight prior to procedure. Afebrile and WBCs WNL. INR pending.  Risks and benefits of L4 kyphoplasty/vertebroplasty were discussed with the patient including, but not limited to education regarding the natural healing process of compression fractures without intervention, bleeding, infection, cement migration which may cause spinal cord damage, paralysis, pulmonary embolism or even death. This interventional procedure involves the use of X-rays and because of the nature of the planned procedure, it is possible that we will have prolonged use of X-ray fluoroscopy. Potential radiation risks to you include (but are not limited to) the following: - A slightly elevated risk for cancer  several years later in life. This risk is typically less than 0.5% percent. This risk is low in comparison to the normal incidence of human cancer, which is 33% for women and 50% for men according to the [employer] Society. - Radiation induced injury can include skin redness, resembling a rash, tissue breakdown / ulcers and hair loss (which can be temporary or  permanent).  The likelihood of either of these occurring depends on the difficulty of the procedure and whether you are sensitive to radiation due to previous procedures, disease, or genetic conditions.  IF your procedure requires a prolonged use of radiation, you will be notified and given written instructions for further action.  It is your responsibility to monitor the irradiated area for the 2 weeks following the procedure and to notify your physician if you are concerned that you have suffered a radiation induced injury.   All of the patient's questions were answered, patient is agreeable to proceed. Consent signed and in chart.   Thank you for this interesting consult.  I greatly enjoyed meeting Charlene Dickerson and look forward to participating in their care.  A copy of this report was sent to the requesting provider on this date.  Electronically Signed: Elwin Mocha, PA-C 06/22/2019, 9:34 AM   I spent a total of 40 Minutes in face to face in clinical consultation, greater than 50% of which was counseling/coordinating care for L4 compression fracture/vertebral augmentation.

## 2019-06-22 NOTE — Progress Notes (Addendum)
Progress Note  Patient Name: Charlene Dickerson Date of Encounter: 06/22/2019  Primary Cardiologist: Kristeen Miss, MD   Subjective   Denies any chest pain or dyspnea  Inpatient Medications    Scheduled Meds: . atorvastatin  40 mg Oral q1800  . clopidogrel  75 mg Oral Daily  . docusate sodium  100 mg Oral BID  . feeding supplement (ENSURE ENLIVE)  237 mL Oral BID BM  . latanoprost  1 drop Both Eyes QHS  . multivitamin with minerals  1 tablet Oral Daily  . propranolol  10 mg Oral BID  . sodium chloride flush  3 mL Intravenous Q12H   Continuous Infusions: . sodium chloride Stopped (06/21/19 1118)  . cefTRIAXone (ROCEPHIN)  IV Stopped (06/21/19 2314)   PRN Meds: albuterol, ALPRAZolam, ondansetron **OR** ondansetron (ZOFRAN) IV   Vital Signs    Vitals:   06/21/19 2338 06/22/19 0144 06/22/19 0329 06/22/19 0757  BP: 116/68  122/60 125/70  Pulse: 67  62 73  Resp: 17 15 14 17   Temp: 97.6 F (36.4 C)  97.6 F (36.4 C) 97.8 F (36.6 C)  TempSrc: Oral  Oral Oral  SpO2: 98% 92% 99% 93%  Weight:      Height:        Intake/Output Summary (Last 24 hours) at 06/22/2019 0848 Last data filed at 06/22/2019 0244 Gross per 24 hour  Intake 1093.88 ml  Output 1000 ml  Net 93.88 ml   Last 3 Weights 06/21/2019 06/20/2019 06/15/2019  Weight (lbs) 145 lb 11.6 oz 146 lb 9.7 oz 151 lb  Weight (kg) 66.1 kg 66.5 kg 68.493 kg      Telemetry    Sinus rhythm with rate 60s to 70s, PVCs- Personally Reviewed  ECG    No new EKG- Personally Reviewed  Physical Exam   GEN: No acute distress.   Neck: No JVD Cardiac: RRR, no murmurs, rubs, or gallops.  Respiratory: Clear to auscultation bilaterally. GI: Soft, nontender, non-distended  MS: No edema Neuro:  Nonfocal  Psych: Normal affect   Labs    High Sensitivity Troponin:   Recent Labs  Lab 06/20/19 0301 06/20/19 0442  TROPONINIHS 36* 64*      Chemistry Recent Labs  Lab 06/20/19 0850 06/20/19 1321 06/21/19 0002  06/21/19 0246 06/22/19 0241  NA 135   < > 140 140 144  K 4.5   < > 3.8 3.5 4.0  CL 99   < > 100 100 102  CO2 22   < > 28 33* 34*  GLUCOSE 199*   < > 121* 112* 88  BUN 80*   < > 71* 65* 31*  CREATININE 3.27*   < > 2.27* 1.95* 1.17*  CALCIUM 10.0   < > 8.5* 8.4* 8.9  PROT 6.1*  --   --  5.3* 5.2*  ALBUMIN 3.3*   < > 2.8* 2.8* 2.7*  AST 21  --   --  17 19  ALT 20  --   --  18 17  ALKPHOS 43  --   --  35* 39  BILITOT 1.2  --   --  0.9 0.5  GFRNONAA 12*   < > 19* 23* 43*  GFRAA 14*   < > 22* 27* 50*  ANIONGAP 14   < > 12 7 8    < > = values in this interval not displayed.     Hematology Recent Labs  Lab 06/20/19 0850 06/21/19 0246 06/22/19 0241  WBC 9.5 8.0 7.5  RBC 3.86* 3.41* 3.43*  HGB 12.8 11.2* 11.2*  HCT 36.6 32.9* 34.3*  MCV 94.8 96.5 100.0  MCH 33.2 32.8 32.7  MCHC 35.0 34.0 32.7  RDW 12.6 12.8 12.5  PLT 215 162 148*    BNP Recent Labs  Lab 06/20/19 0528 06/21/19 0246  BNP 144.3* 330.9*     DDimer No results for input(s): DDIMER in the last 168 hours.   Radiology    ECHOCARDIOGRAM COMPLETE  Result Date: 06/20/2019    ECHOCARDIOGRAM REPORT   Patient Name:   Charlene Dickerson Date of Exam: 06/20/2019 Medical Rec #:  [id number]      Height:       62.0 in Accession #:    [id number]     Weight:       146.6 lb Date of Birth:  [birth year]      BSA:          1.675 m Patient Age:    86 years       BP:           100/57 mmHg Patient Gender: F              HR:           79 bpm. Exam Location:  Inpatient Procedure: 2D Echo, Cardiac Doppler and Color Doppler Indications:    Ventricular tachycardia  History:        Patient has prior history of Echocardiogram examinations, most                 recent 02/19/2018. Acute MI, Arrythmias:Atrial Fibrillation;                 Risk Factors:Hypertension. AKI.  Sonographer:    Lavenia Atlas Referring Phys: [phone] ANASTASSIA DOUTOVA IMPRESSIONS  1. Left ventricular ejection fraction, by estimation, is 60 to 65%. The left ventricle has normal  function. The left ventricle has no regional wall motion abnormalities. Left ventricular diastolic parameters are consistent with Grade I diastolic dysfunction (impaired relaxation).  2. Right ventricular systolic function is normal. The right ventricular size is normal. There is mildly elevated pulmonary artery systolic pressure. The estimated right ventricular systolic pressure is 32.4 mmHg.  3. The mitral valve is normal in structure. No evidence of mitral valve regurgitation. No evidence of mitral stenosis.  4. Tricuspid valve regurgitation is moderate.  5. The aortic valve is normal in structure. Aortic valve regurgitation is moderate. No aortic stenosis is present.  6. The inferior vena cava is normal in size with greater than 50% respiratory variability, suggesting right atrial pressure of 3 mmHg. FINDINGS  Left Ventricle: Left ventricular ejection fraction, by estimation, is 60 to 65%. The left ventricle has normal function. The left ventricle has no regional wall motion abnormalities. The left ventricular internal cavity size was normal in size. There is  no left ventricular hypertrophy. Left ventricular diastolic parameters are consistent with Grade I diastolic dysfunction (impaired relaxation). Right Ventricle: The right ventricular size is normal. No increase in right ventricular wall thickness. Right ventricular systolic function is normal. There is mildly elevated pulmonary artery systolic pressure. The tricuspid regurgitant velocity is 2.71  m/s, and with an assumed right atrial pressure of 3 mmHg, the estimated right ventricular systolic pressure is 32.4 mmHg. Left Atrium: Left atrial size was normal in size. Right Atrium: Right atrial size was normal in size. Pericardium: There is no evidence of pericardial effusion. Mitral Valve: The mitral valve is normal in structure. Normal mobility of the mitral  valve leaflets. Moderate mitral annular calcification. No evidence of mitral valve regurgitation. No  evidence of mitral valve stenosis. Tricuspid Valve: The tricuspid valve is normal in structure. Tricuspid valve regurgitation is moderate . No evidence of tricuspid stenosis. Aortic Valve: The aortic valve is normal in structure. Aortic valve regurgitation is moderate. No aortic stenosis is present. Pulmonic Valve: The pulmonic valve was normal in structure. Pulmonic valve regurgitation is not visualized. No evidence of pulmonic stenosis. Aorta: The aortic root is normal in size and structure. Venous: The inferior vena cava is normal in size with greater than 50% respiratory variability, suggesting right atrial pressure of 3 mmHg. IAS/Shunts: No atrial level shunt detected by color flow Doppler.  LEFT VENTRICLE PLAX 2D LVIDd:         5.00 cm  Diastology LVIDs:         4.20 cm  LV e' lateral:   5.77 cm/s LV PW:         1.00 cm  LV E/e' lateral: 11.2 LV IVS:        0.80 cm  LV e' medial:    5.44 cm/s LVOT diam:     1.90 cm  LV E/e' medial:  11.9 LV SV:         51 LV SV Index:   31 LVOT Area:     2.84 cm  RIGHT VENTRICLE RV Basal diam:  2.60 cm RV S prime:     17.30 cm/s TAPSE (M-mode): 2.9 cm LEFT ATRIUM             Index       RIGHT ATRIUM           Index LA diam:        4.50 cm 2.69 cm/m  RA Area:     15.80 cm LA Vol (A2C):   58.3 ml 34.80 ml/m RA Volume:   40.20 ml  24.00 ml/m LA Vol (A4C):   56.6 ml 33.78 ml/m LA Biplane Vol: 61.3 ml 36.59 ml/m  AORTIC VALVE LVOT Vmax:   86.70 cm/s LVOT Vmean:  54.500 cm/s LVOT VTI:    0.181 m  AORTA Ao Root diam: 3.30 cm MITRAL VALVE                TRICUSPID VALVE MV Area (PHT): 3.72 cm     TR Peak grad:   29.4 mmHg MV Decel Time: 204 msec     TR Vmax:        271.00 cm/s MV E velocity: 64.90 cm/s MV A velocity: 118.00 cm/s  SHUNTS MV E/A ratio:  0.55         Systemic VTI:  0.18 m                             Systemic Diam: 1.90 cm Candee Furbish MD Electronically signed by Candee Furbish MD Signature Date/Time: 06/20/2019/3:26:45 PM    Final     Cardiac Studies   TTE  06/20/19: 1. Left ventricular ejection fraction, by estimation, is 60 to 65%. The  left ventricle has normal function. The left ventricle has no regional  wall motion abnormalities. Left ventricular diastolic parameters are  consistent with Grade I diastolic  dysfunction (impaired relaxation).  2. Right ventricular systolic function is normal. The right ventricular  size is normal. There is mildly elevated pulmonary artery systolic  pressure. The estimated right ventricular systolic pressure is 91.4 mmHg.  3. The mitral valve is  normal in structure. No evidence of mitral valve  regurgitation. No evidence of mitral stenosis.  4. Tricuspid valve regurgitation is moderate.  5. The aortic valve is normal in structure. Aortic valve regurgitation is  moderate. No aortic stenosis is present.  6. The inferior vena cava is normal in size with greater than 50%  respiratory variability, suggesting right atrial pressure of 3 mmHg.   Patient Profile     86 y.o. female hypertension, STEMI (2019), HL, GERD, PAF, diverticulosis who is being seen today for the evaluation of WCT   Assessment & Plan    VT: developed acutely while in the ED at [facility]. In the setting of K+ 7.5, and AKI with Cr 5.8. She was cardioverted x1 successfully, did not lose a pulse. Telemetry review shows SR with PVCs. Suspect her arrhythmia was related to her electrolyte imbalances.  Normal biventricular function on echo.  No further cardiac work-up recommended, would continue to monitor on telemetry  Hyperkalemia: resolved, K+ 7.5 on admission, s/p emergent HD, down to 3.5 today  AKI: suspect 2/2 NSAID use bc of her lower back pain.  Underwent emergent HD, creatinine improving  PAF:  CHA2DS2-VASc 4 (agex2, CAD, female).  On Eliquis prior to admission. Currently in SR. Eliquis has been held. Renal function has normalized.  She is maintaining sinus rhythm, do no need to start heparin gtt.  Would restart Eliquis when no further  procedures planned.  Inferior STEMI: back in 12/19 c/b [facility]. Resolved and has been doing quite well since then. hsTn with low flat trend. EKG without ischemia. Remains on plavix, no ASA with the need for Eliquis. TTE shows normal LV systolic function.  Given possible need for procedure (kyphoplasty) for compression fracture, and has been over 1 year since stent placed, would stop plavix and start ASA 81 mg daily.  Would continue ASA+Eliquis moving forward.  If procedure planned, OK to hold Eliquis but would continue ASA perioperatively  [facility] will sign off.   Medication Recommendations:  Stop plavix, start ASA 81 mg daily.  Restart Eliquis when no further procedures planned Other recommendations (labs, testing, etc):  None Follow up as an outpatient:  Will schedule outpatient f/u with Dr Elease Hashimoto     For questions or updates, please contact [facility] Please consult [email or web address] for contact info under        Signed, Little Ishikawa, MD  06/22/2019, 8:48 AM

## 2019-06-23 LAB — COMPREHENSIVE METABOLIC PANEL
ALT: 17 U/L (ref 0–44)
AST: 19 U/L (ref 15–41)
Albumin: 2.6 g/dL — ABNORMAL LOW (ref 3.5–5.0)
Alkaline Phosphatase: 25 U/L — ABNORMAL LOW (ref 38–126)
Anion gap: 7 (ref 5–15)
BUN: 13 mg/dL (ref 8–23)
CO2: 31 mmol/L (ref 22–32)
Calcium: 8.4 mg/dL — ABNORMAL LOW (ref 8.9–10.3)
Chloride: 100 mmol/L (ref 98–111)
Creatinine, Ser: 0.87 mg/dL (ref 0.44–1.00)
GFR calc Af Amer: >60 mL/min (ref >60)
GFR calc non Af Amer: >60 mL/min (ref >60)
Glucose, Bld: 91 mg/dL (ref 70–99)
Potassium: 3.4 mmol/L — ABNORMAL LOW (ref 3.5–5.1)
Sodium: 138 mmol/L (ref 135–145)
Total Bilirubin: 0.5 mg/dL (ref 0.3–1.2)
Total Protein: 5.1 g/dL — ABNORMAL LOW (ref 6.5–8.1)

## 2019-06-23 LAB — PROTIME-INR
INR: 1.1 (ref 0.8–1.2)
Prothrombin Time: 13.7 seconds (ref 11.4–15.2)

## 2019-06-23 LAB — CBC
HCT: 35.2 % — ABNORMAL LOW (ref 36.0–46.0)
Hemoglobin: 11.8 g/dL — ABNORMAL LOW (ref 12.0–15.0)
MCH: 33.7 pg (ref 26.0–34.0)
MCHC: 33.5 g/dL (ref 30.0–36.0)
MCV: 100.6 fL — ABNORMAL HIGH (ref 80.0–100.0)
Platelets: 143 10*3/uL — ABNORMAL LOW (ref 150–400)
RBC: 3.5 MIL/uL — ABNORMAL LOW (ref 3.87–5.11)
RDW: 12.1 % (ref 11.5–15.5)
WBC: 7.6 10*3/uL (ref 4.0–10.5)
nRBC: 0 % (ref 0.0–0.2)

## 2019-06-23 MED ORDER — ASPIRIN EC 81 MG PO TBEC
81.0000 mg | DELAYED_RELEASE_TABLET | Freq: Every day | ORAL | Status: DC
  Administered 2019-06-24: 81 mg via ORAL
  Filled 2019-06-23: qty 1

## 2019-06-23 MED ORDER — POTASSIUM CHLORIDE CRYS ER 20 MEQ PO TBCR
40.0000 meq | EXTENDED_RELEASE_TABLET | Freq: Once | ORAL | Status: AC
  Administered 2019-06-23: 40 meq via ORAL
  Filled 2019-06-23: qty 2

## 2019-06-23 MED ORDER — CEPHALEXIN 500 MG PO CAPS
500.0000 mg | ORAL_CAPSULE | Freq: Two times a day (BID) | ORAL | Status: DC
  Administered 2019-06-23 – 2019-06-24 (×3): 500 mg via ORAL
  Filled 2019-06-23 (×4): qty 1

## 2019-06-23 MED ORDER — POTASSIUM CHLORIDE CRYS ER 20 MEQ PO TBCR: 40.0000 meq | EXTENDED_RELEASE_TABLET | ORAL | Status: DC

## 2019-06-23 NOTE — Progress Notes (Signed)
Referring Physician(s): Dr. Allena Katz  Supervising Physician: Julieanne Cotton  Patient Status:  [facility] - In-pt  Chief Complaint: Back pain  Subjective: Resting comfortably.  Daughter at bedside.  Reports ongoing back pain.   Allergies: Clindamycin/lincomycin  Medications: Prior to Admission medications   Medication Sig Start Date End Date Taking? Authorizing Provider  acetaminophen (TYLENOL) 650 MG CR tablet Take 1,300 mg by mouth as needed for pain.   Yes [provider]  ALPRAZolam Prudy Feeler) 0.5 MG tablet Take 0.25-0.5 tablets by mouth 2 (two) times daily as needed for anxiety.  03/05/18  Yes [provider]  apixaban (ELIQUIS) 5 MG TABS tablet TAKE 1 TABLET(5 MG) BY MOUTH TWICE DAILY Patient taking differently: Take 5 mg by mouth 2 (two) times daily.  06/13/19  Yes Nahser, Deloris Ping, MD  atorvastatin (LIPITOR) 40 MG tablet TAKE 1 TABLET(40 MG) BY MOUTH DAILY AT 6 PM Patient taking differently: Take 40 mg by mouth daily at 6 PM.  04/26/19  Yes Nahser, Deloris Ping, MD  calcium citrate (CALCITRATE - DOSED IN MG ELEMENTAL CALCIUM) 950 MG tablet Take 200 mg of elemental calcium by mouth daily.   Yes [provider]  clopidogrel (PLAVIX) 75 MG tablet Take 1 tablet (75 mg total) by mouth daily. 06/09/19  Yes Nahser, Deloris Ping, MD  famotidine (PEPCID AC) 10 MG tablet Take 10 mg by mouth as needed for heartburn or indigestion.   Yes [provider]  furosemide (LASIX) 20 MG tablet Take 2 tablets (40 mg total) by mouth 2 (two) times daily. 10/20/18 10/20/19 Yes Weaver, Scott T, PA-C  latanoprost (XALATAN) 0.005 % ophthalmic solution Place 1 drop into both eyes at bedtime.  04/17/11  Yes [provider]  loratadine (CLARITIN) 10 MG tablet Take 10 mg by mouth daily.   Yes [provider]  Multiple Vitamin (MULTIVITAMIN WITH MINERALS) TABS tablet Take 1 tablet by mouth daily.   Yes [provider]  nitroGLYCERIN (NITROSTAT) 0.4 MG SL tablet  Place 1 tablet (0.4 mg total) under the tongue every 5 (five) minutes as needed for chest pain. 02/24/18 06/20/19 Yes Simmons, Brittainy M, PA-C  pantoprazole (PROTONIX) 40 MG tablet TAKE 1 TABLET(40 MG) BY MOUTH AT BEDTIME Patient taking differently: Take 40 mg by mouth at bedtime as needed (acid reflux).  03/28/19  Yes Simmons, Brittainy M, PA-C  potassium chloride (KLOR-CON) 10 MEQ tablet TAKE 2 TABLETS BY MOUTH TWICE DAILY Patient taking differently: Take 20 mEq by mouth 2 (two) times daily.  04/19/19  Yes Nahser, Deloris Ping, MD  propranolol (INDERAL) 10 MG tablet Take 10 mg by mouth 2 (two) times daily. (2) 1 times daily    Yes [provider]  simethicone (MYLICON) 80 MG chewable tablet Chew 160 mg by mouth every 6 (six) hours as needed for flatulence.   Yes [provider]  Vitamin D, Ergocalciferol, (DRISDOL) 1.25 MG (50000 UT) CAPS capsule Take 50,000 Units by mouth every Wednesday.    Yes [provider]     Vital Signs: BP 120/68 (BP Location: Left Arm)   Pulse 61   Temp 98.1 F (36.7 C) (Oral)   Resp 16   Ht 5\' 2"  (1.575 m)   Wt 145 lb 11.6 oz (66.1 kg)   SpO2 95%   BMI 26.65 kg/m   Physical Exam  NAD, alert, resting comfortably Heart: RRR Chest: no increased WOB Back: pain with movement.   Imaging: CT ABDOMEN PELVIS WO CONTRAST  Result Date: 06/20/2019 CLINICAL  DATA:  Lower back pain.  Pancreatitis suspected EXAM: CT ABDOMEN AND PELVIS WITHOUT CONTRAST TECHNIQUE: Multidetector CT imaging of the abdomen and pelvis was performed following the standard protocol without IV contrast. COMPARISON:  None. FINDINGS: Lower chest: Densely calcified visualized coronary arteries. Lung bases clear. No effusions. Hepatobiliary: No focal hepatic abnormality. Gallbladder unremarkable. Pancreas: No focal abnormality or ductal dilatation. Spleen: No focal abnormality.  Normal size. Adrenals/Urinary Tract: 4.6 cm cyst in the midpole of the left kidney. No  hydronephrosis. Adrenal glands and urinary bladder unremarkable. Stomach/Bowel: Diffuse colonic diverticulosis, most pronounced in the sigmoid colon. No active diverticulitis. No evidence of bowel obstruction. Vascular/Lymphatic: Aortic atherosclerosis. No enlarged abdominal or pelvic lymph nodes. Reproductive: Uterus and adnexa unremarkable.  No mass. Other: No free fluid or free air. Musculoskeletal: Moderate compression fracture through the superior endplate of L4 appears acute. Diffuse degenerative disc and facet disease. IMPRESSION: Diffuse colonic diverticulosis.  No active diverticulitis. Aortic atherosclerosis. Moderate L4 compression fracture, which appears acute. Coronary artery disease. Electronically Signed   By: Charlett Nose M.D.   On: 06/20/2019 00:05   DG Chest 2 View  Result Date: 06/19/2019 CLINICAL DATA:  Fatigue, generalized weakness. EXAM: CHEST - 2 VIEW COMPARISON:  Chest x-ray dated 02/20/2018. FINDINGS: Heart size and mediastinal contours are stable. Lungs are clear. No pleural effusion is seen. Osseous structures about the chest are unremarkable. IMPRESSION: No active cardiopulmonary disease. No evidence of pneumonia or pulmonary edema. Electronically Signed   By: Bary Richard M.D.   On: 06/19/2019 20:22   ECHOCARDIOGRAM COMPLETE  Result Date: 06/20/2019    ECHOCARDIOGRAM REPORT   Patient Name:   Charlene Dickerson Date of Exam: 06/20/2019 Medical Rec #:  [id number]      Height:       62.0 in Accession #:    [id number]     Weight:       146.6 lb Date of Birth:  [birth year]      BSA:          1.675 m Patient Age:    86 years       BP:           100/57 mmHg Patient Gender: F              HR:           79 bpm. Exam Location:  Inpatient Procedure: 2D Echo, Cardiac Doppler and Color Doppler Indications:    Ventricular tachycardia  History:        Patient has prior history of Echocardiogram examinations, most                 recent 02/19/2018. Acute MI, Arrythmias:Atrial Fibrillation;                  Risk Factors:Hypertension. AKI.  Sonographer:    Lavenia Atlas Referring Phys: [phone] ANASTASSIA DOUTOVA IMPRESSIONS  1. Left ventricular ejection fraction, by estimation, is 60 to 65%. The left ventricle has normal function. The left ventricle has no regional wall motion abnormalities. Left ventricular diastolic parameters are consistent with Grade I diastolic dysfunction (impaired relaxation).  2. Right ventricular systolic function is normal. The right ventricular size is normal. There is mildly elevated pulmonary artery systolic pressure. The estimated right ventricular systolic pressure is 32.4 mmHg.  3. The mitral valve is normal in structure. No evidence of mitral valve regurgitation. No evidence of mitral stenosis.  4. Tricuspid valve regurgitation is moderate.  5. The aortic valve is normal in structure.  Aortic valve regurgitation is moderate. No aortic stenosis is present.  6. The inferior vena cava is normal in size with greater than 50% respiratory variability, suggesting right atrial pressure of 3 mmHg. FINDINGS  Left Ventricle: Left ventricular ejection fraction, by estimation, is 60 to 65%. The left ventricle has normal function. The left ventricle has no regional wall motion abnormalities. The left ventricular internal cavity size was normal in size. There is  no left ventricular hypertrophy. Left ventricular diastolic parameters are consistent with Grade I diastolic dysfunction (impaired relaxation). Right Ventricle: The right ventricular size is normal. No increase in right ventricular wall thickness. Right ventricular systolic function is normal. There is mildly elevated pulmonary artery systolic pressure. The tricuspid regurgitant velocity is 2.71  m/s, and with an assumed right atrial pressure of 3 mmHg, the estimated right ventricular systolic pressure is 32.4 mmHg. Left Atrium: Left atrial size was normal in size. Right Atrium: Right atrial size was normal in size. Pericardium: There is  no evidence of pericardial effusion. Mitral Valve: The mitral valve is normal in structure. Normal mobility of the mitral valve leaflets. Moderate mitral annular calcification. No evidence of mitral valve regurgitation. No evidence of mitral valve stenosis. Tricuspid Valve: The tricuspid valve is normal in structure. Tricuspid valve regurgitation is moderate . No evidence of tricuspid stenosis. Aortic Valve: The aortic valve is normal in structure. Aortic valve regurgitation is moderate. No aortic stenosis is present. Pulmonic Valve: The pulmonic valve was normal in structure. Pulmonic valve regurgitation is not visualized. No evidence of pulmonic stenosis. Aorta: The aortic root is normal in size and structure. Venous: The inferior vena cava is normal in size with greater than 50% respiratory variability, suggesting right atrial pressure of 3 mmHg. IAS/Shunts: No atrial level shunt detected by color flow Doppler.  LEFT VENTRICLE PLAX 2D LVIDd:         5.00 cm  Diastology LVIDs:         4.20 cm  LV e' lateral:   5.77 cm/s LV PW:         1.00 cm  LV E/e' lateral: 11.2 LV IVS:        0.80 cm  LV e' medial:    5.44 cm/s LVOT diam:     1.90 cm  LV E/e' medial:  11.9 LV SV:         51 LV SV Index:   31 LVOT Area:     2.84 cm  RIGHT VENTRICLE RV Basal diam:  2.60 cm RV S prime:     17.30 cm/s TAPSE (M-mode): 2.9 cm LEFT ATRIUM             Index       RIGHT ATRIUM           Index LA diam:        4.50 cm 2.69 cm/m  RA Area:     15.80 cm LA Vol (A2C):   58.3 ml 34.80 ml/m RA Volume:   40.20 ml  24.00 ml/m LA Vol (A4C):   56.6 ml 33.78 ml/m LA Biplane Vol: 61.3 ml 36.59 ml/m  AORTIC VALVE LVOT Vmax:   86.70 cm/s LVOT Vmean:  54.500 cm/s LVOT VTI:    0.181 m  AORTA Ao Root diam: 3.30 cm MITRAL VALVE                TRICUSPID VALVE MV Area (PHT): 3.72 cm     TR Peak grad:   29.4 mmHg MV Decel Time: 204  msec     TR Vmax:        271.00 cm/s MV E velocity: 64.90 cm/s MV A velocity: 118.00 cm/s  SHUNTS MV E/A ratio:  0.55          Systemic VTI:  0.18 m                             Systemic Diam: 1.90 cm Donato Schultz MD Electronically signed by Donato Schultz MD Signature Date/Time: 06/20/2019/3:26:45 PM    Final    DG HIP UNILAT WITH PELVIS 2-3 VIEWS LEFT  Result Date: 06/22/2019 CLINICAL DATA:  Pain in back, left hip EXAM: DG HIP (WITH OR WITHOUT PELVIS) 2-3V LEFT COMPARISON:  None. FINDINGS: Osteopenia. There is no evidence of hip fracture or dislocation. There is no evidence of arthropathy or other focal bone abnormality. IMPRESSION: Osteopenia. No no displaced fracture or dislocation of the left hip. Joint spaces are preserved. Electronically Signed   By: Lauralyn Primes M.D.   On: 06/22/2019 12:43    Labs:  CBC: Recent Labs    06/20/19 0850 06/21/19 0246 06/22/19 0241 06/23/19 0343  WBC 9.5 8.0 7.5 7.6  HGB 12.8 11.2* 11.2* 11.8*  HCT 36.6 32.9* 34.3* 35.2*  PLT 215 162 148* 143*    COAGS: Recent Labs    06/23/19 0343  INR 1.1    BMP: Recent Labs    06/21/19 0002 06/21/19 0246 06/22/19 0241 06/23/19 0343  NA 140 140 144 138  K 3.8 3.5 4.0 3.4*  CL 100 100 102 100  CO2 28 33* 34* 31  GLUCOSE 121* 112* 88 91  BUN 71* 65* 31* 13  CALCIUM 8.5* 8.4* 8.9 8.4*  CREATININE 2.27* 1.95* 1.17* 0.87  GFRNONAA 19* 23* 43* >60  GFRAA 22* 27* 50* >60    LIVER FUNCTION TESTS: Recent Labs    06/20/19 0850 06/20/19 1321 06/21/19 0002 06/21/19 0246 06/22/19 0241 06/23/19 0343  BILITOT 1.2  --   --  0.9 0.5 0.5  AST 21  --   --  17 19 19   ALT 20  --   --  18 17 17   ALKPHOS 43  --   --  35* 39 25*  PROT 6.1*  --   --  5.3* 5.2* 5.1*  ALBUMIN 3.3*   < > 2.8* 2.8* 2.7* 2.6*   < > = values in this interval not displayed.    Assessment and Plan: Back pain L4 compression fracture Patient with episode of VT in the ED treated with cardioversion.  Also with history of PAF and inferior STEMI in 2019 on chronic Eliquis and plavix.  Patient with plans to undergo kyphoplasty, however planned complicated  by UTI on admission as well as her cardiac/medication history.  Patient was assessed by Cardiology who felt patient was appropriate to discontinue Plavix given stability over the past 1 year. Last dose was today.  She can continue Eliquis through Sunday, then plan to hold for possible procedure Tuesday if she remains inpatient and continues to improve her urinary tract infection. Patient currently remains on IV abx for her UTI. Her UA is overall unchanged from admission.  Her symptoms are improved.  WBC has returned to normal.  She has plans to discharge to SNF at d/c. Spoke with patient and daughter at length.   Electronically Signed: Saturday, PA 06/23/2019, 4:23 PM   I spent a total of 15 Minutes at the the  patient's bedside AND on the patient's hospital floor or unit, greater than 50% of which was counseling/coordinating care for L4 compression fracture.

## 2019-06-23 NOTE — Progress Notes (Signed)
Physical Therapy Treatment Patient Details Name: Charlene Dickerson MRN: [id number] DOB: December 21, 1932 Today's Date: 06/23/2019    History of Present Illness 87 yo admitted with fatigue and lower back pain pt with L4 fx, aKI with metabolic acidosis and severe hyperkalemia with episode of tachycardia in ED s/p shock. Pt transferred from [facility] to [facility] for urgent HD. PMhx: gERD, HTN, PAF, CAD    PT Comments    Pt pleasant and reports feeling better and able to walk in hall this session. Pt with continued cognitive deficits noted during session and discussed potential need for ALF for supervision long term with pt and daughter with pt very resistant to the idea that cognitive deficits have been demonstrated. Pt with assist for pericare this session and improved mobility with education for precautions and RW use. Daughter able to confirm local family can only provide very sporadic intermittent assist and that concern over laundry not being done and bills not being paid are a concern as pt unaware. Will continue to follow with D/C plan appropriate. Encouraged mobility with nursing.   HR 76-115 with periods of monitor reading Vtach with sporadic waveform SpO2 100% on RA    Follow Up Recommendations  SNF;Supervision/Assistance - 24 hour     Equipment Recommendations  Rolling walker with 5" wheels    Recommendations for Other Services       Precautions / Restrictions Precautions Precautions: Back;Fall Precaution Comments: reviewed back precautions throughout as pt demo poor carryover with pt able to state 2/3 on arrival and 3/3 end of session    Mobility  Bed Mobility Overal bed mobility: Needs Assistance Bed Mobility: Rolling;Sidelying to Sit Rolling: Min guard Sidelying to sit: Min guard       General bed mobility comments: cues for sequene with pt able to demonstrate  Transfers Overall transfer level: Needs assistance   Transfers: Sit to/from Stand Sit to Stand: Min guard          General transfer comment: cues for hand placement to stand from bed x 2, toilet x 1 pt with tendency to pull up on RW despite cues  Ambulation/Gait Ambulation/Gait assistance: Min guard Gait Distance (Feet): 200 Feet Assistive device: Rolling walker (2 wheeled) Gait Pattern/deviations: Step-through pattern;Decreased stride length   Gait velocity interpretation: >2.62 ft/sec, indicative of community ambulatory General Gait Details: cues for direction, posture and proximity to RW as well as not to pick up RW during Dance movement psychotherapist [place] (Stroke Patients Only)       Balance Overall balance assessment: Needs assistance   Sitting balance-Leahy Scale: Good       Standing balance-Leahy Scale: Fair Standing balance comment: able to stand at sink without UE support                            Cognition Arousal/Alertness: Awake/alert Behavior During Therapy: WFL for tasks assessed/performed Overall Cognitive Status: Impaired/Different from baseline Area of Impairment: Memory;Problem solving;Awareness;Orientation                 Orientation Level: Disoriented to;Time   Memory: Decreased recall of precautions;Decreased short-term memory     Awareness: Intellectual Problem Solving: Slow processing;Difficulty sequencing;Requires verbal cues General Comments: pt unable to solve simple money management problem even with repetition, pt able to recall room number but could not find and almost entered wrong room 2x,  pt not oriented to day. Pt was able to state president and name 3 C animals this session      Exercises      General Comments        Pertinent Vitals/Pain Pain Score: 4  Pain Location: back pain with activity Pain Descriptors / Indicators: Aching;Sore Pain Intervention(s): Limited activity within patient's tolerance;Monitored during session;Repositioned    Home Living                       Prior Function            PT Goals (current goals can now be found in the care plan section) Progress towards PT goals: Progressing toward goals    Frequency    Min 3X/week      PT Plan Current plan remains appropriate    Co-evaluation              AM-PAC PT "6 Clicks" Mobility   Outcome Measure  Help needed turning from your back to your side while in a flat bed without using bedrails?: A Little Help needed moving from lying on your back to sitting on the side of a flat bed without using bedrails?: A Little Help needed moving to and from a bed to a chair (including a wheelchair)?: A Little Help needed standing up from a chair using your arms (e.g., wheelchair or bedside chair)?: A Little Help needed to walk in hospital room?: A Little Help needed climbing 3-5 steps with a railing? : A Little 6 Click Score: 18    End of Session   Activity Tolerance: Patient tolerated treatment well Patient left: in chair;with call bell/phone within reach;with chair alarm set;with family/visitor present Nurse Communication: Mobility status PT Visit Diagnosis: Other abnormalities of gait and mobility (R26.89);Difficulty in walking, not elsewhere classified (R26.2);Muscle weakness (generalized) (M62.81)     Time: 4854-6270 PT Time Calculation (min) (ACUTE ONLY): 35 min  Charges:  $Gait Training: 8-22 mins $Therapeutic Activity: 8-22 mins                     Deema Juncaj P, PT Acute Rehabilitation Services Pager: [phone] Office: [phone]    Mats Jeanlouis B Manon Banbury 06/23/2019, 1:50 PM

## 2019-06-23 NOTE — Progress Notes (Signed)
[facility] Progress Note  Patient: Charlene Dickerson    JYN:[id number]  DOA: 06/19/2019     Date of Service: the patient was seen and examined on 06/23/2019  Chief Complaint  Patient presents with  . Fatigue   Brief hospital course: Past medical history of GERD, HTN, osteoporosis, PAF, CAD SP PCI 2019.  Presented with fatigue.  Found to have hyperkalemia.  While in ER underwent wide-complex tachycardia requiring amiodarone, bicarb, calcium and synchronized shock.  Did not require CPR.  Never lost pulse.  Required bag mask ventilation.  Underwent emergent HD.  Cardiology and nephrology consulted.  Found to have compression fracture IR consulted. Currently further plan is arranged for rehab.  Assessment and Plan: 1.  Acute kidney injury. Metabolic acidosis. Hyperkalemia Uremia Dehydration SP urgent hemodialysis for V. tach and hyperkalemia. Appreciate nephrology input. HD catheter now removed. Renal function has returned back to normal. Suspecting AKI secondary to poor intake, diuretic and ongoing use of anesthetics for back pain.  2.  Wide-complex tachycardia SP synchronized cardioversion in the ED SP amiodarone. Secondary to hyperkalemia. Continue to monitor on telemetry. Cardiology was consulted no further work-up recommended for now. Echocardiogram showed preserved EF.  No wall motion abnormality.  3.  Klebsiella UTI Treated with IV ceftriaxone. Switch to oral antibiotic.  Monitor.  4.  Paroxysmal A. fib On Eliquis and propranolol. Continuing the same for now. Eliquis is currently on hold.  Will resume it tomorrow.  5.  L4 compression fracture Patient has been following up with orthopedic outpatient. Initial plan was epidural injection in May. In the hospital orthopedic recommended to consider kyphoplasty. IR was consulted for kyphoplasty. Patient was on Plavix therefore unable to perform kyphoplasty for 5 days until patient is off of the medication. Currently  Plavix discontinued and transition to aspirin 81 mg. Patient will have to be off of Eliquis for 48 hours before procedure as well. Per radiology patient also has to improve from UTI prior to considering procedure. Patient does report pain 5 out of 10 and has not been using pain medication as she is worried about side effects.  6.  CAD SP PCI December 2019 PCI in 2019. No further chest pain right now. Currently recommend no further work-up for now. Given patient's stability patient currently being transitioning from Plavix to aspirin. Which will help with minimizing her bleeding risk as well.  7.  Left hip pain. Secondary to osteoarthritis. Monitor.  8.  Chronic diastolic CHF. Currently euvolemic. Monitor.  9.  Essential hypertension Blood pressure stable. Monitor.  10.  Moderate protein calorie malnutrition.  Body mass index is 26.65 kg/m.  Nutrition Problem: Moderate Malnutrition Etiology: chronic illness(CHF) Interventions: Interventions: Ensure Enlive (each supplement provides 350kcal and 20 grams of protein), MVI      Diet: Cardiac diet DVT Prophylaxis: Subcutaneous Heparin    Advance goals of care discussion: Full code  Family Communication: family was present at bedside, at the time of interview.  The pt provided permission to discuss medical plan with the family. Opportunity was given to ask question and all questions were answered satisfactorily.   Disposition:  Status is: Inpatient  Remains inpatient appropriate because:Ongoing active pain requiring inpatient pain management and Unsafe d/c plan   Dispo: The patient is from: Home              Anticipated d/c is to: SNF              Anticipated d/c date is: 1 day  Patient currently is not medically stable to d/c.  Subjective: Continues to report pain.  No nausea no vomiting.  No fever no chills.  No chest pain.  Physical Exam: General:  alert oriented to time, place, and person.  Appear in  mild distress, affect appropriate Eyes: PERRL ENT: Oral Mucosa Clear, moist  Neck: no JVD,  Cardiovascular: S1 and S2 Present, no Murmur,  Respiratory: good respiratory effort, Bilateral Air entry equal and Decreased, no Crackles, no wheezes Abdomen: Bowel Sound present, Soft and no tenderness,  Skin: no rash Extremities: no Pedal edema, no calf tenderness Neurologic: without any new focal findings Gait not checked due to patient safety concerns  Vitals:   06/23/19 0026 06/23/19 0119 06/23/19 0334 06/23/19 0728  BP: 116/74  113/68 125/70  Pulse: 68  65 69  Resp: 20  17 15   Temp: 98.1 F (36.7 C)  (!) 97.5 F (36.4 C) 97.6 F (36.4 C)  TempSrc: Oral  Oral Oral  SpO2: 96% 97% 99% 94%  Weight:      Height:        Intake/Output Summary (Last 24 hours) at 06/23/2019 0825 Last data filed at 06/23/2019 0000 Gross per 24 hour  Intake 357 ml  Output 700 ml  Net -343 ml   Filed Weights   06/20/19 0421 06/21/19 0410  Weight: 66.5 kg 66.1 kg    Data Reviewed: I have personally reviewed and interpreted daily labs, tele strips, imagings as discussed above. I reviewed all nursing notes, pharmacy notes, vitals, pertinent old records I have discussed plan of care as described above with RN and patient/family.  CBC: Recent Labs  Lab 06/19/19 2009 06/20/19 0125 06/20/19 0442 06/20/19 0850 06/21/19 0246 06/22/19 0241 06/23/19 0343  WBC 11.2*   < > 12.6* 9.5 8.0 7.5 7.6  NEUTROABS 9.1*  --  10.1* 7.7  --   --   --   HGB 15.3*   < > 13.8 12.8 11.2* 11.2* 11.8*  HCT 46.9*   < > 40.2 36.6 32.9* 34.3* 35.2*  MCV 102.6*   < > 98.0 94.8 96.5 100.0 100.6*  PLT 269   < > 249 215 162 148* 143*   < > = values in this interval not displayed.   Basic Metabolic Panel: Recent Labs  Lab 06/20/19 0442 06/20/19 0442 06/20/19 0850 06/20/19 0850 06/20/19 1321 06/20/19 1321 06/20/19 1602 06/20/19 1602 06/20/19 2033 06/21/19 0002 06/21/19 0246 06/22/19 0241 06/23/19 0343  NA 135   <  > 135   < > 135   < > 136   < > 137 140 140 144 138  K 5.4*   < > 4.5   < > 4.4   < > 4.5   < > 4.2 3.8 3.5 4.0 3.4*  CL 101   < > 99   < > 96*   < > 98   < > 101 100 100 102 100  CO2 17*   < > 22   < > 26   < > 28   < > 28 28 33* 34* 31  GLUCOSE 174*   < > 199*   < > 213*   < > 190*   < > 135* 121* 112* 88 91  BUN 160*   < > 80*   < > 83*   < > 85*   < > 78* 71* 65* 31* 13  CREATININE 5.40*   < > 3.27*   < > 3.57*   < >  3.36*   < > 2.66* 2.27* 1.95* 1.17* 0.87  CALCIUM 12.2*   < > 10.0   < > 9.2   < > 9.0   < > 8.4* 8.5* 8.4* 8.9 8.4*  MG 2.6*  --  2.0  --   --   --   --   --   --   --   --   --   --   PHOS  --    < > 4.4  --  4.4  --  4.1  --  3.8 3.3  --   --   --    < > = values in this interval not displayed.    Studies: DG HIP UNILAT WITH PELVIS 2-3 VIEWS LEFT  Result Date: 06/22/2019 CLINICAL DATA:  Pain in back, left hip EXAM: DG HIP (WITH OR WITHOUT PELVIS) 2-3V LEFT COMPARISON:  None. FINDINGS: Osteopenia. There is no evidence of hip fracture or dislocation. There is no evidence of arthropathy or other focal bone abnormality. IMPRESSION: Osteopenia. No no displaced fracture or dislocation of the left hip. Joint spaces are preserved. Electronically Signed   By: Lauralyn Primes M.D.   On: 06/22/2019 12:43    Scheduled Meds: . atorvastatin  40 mg Oral q1800  . clopidogrel  75 mg Oral Daily  . docusate sodium  100 mg Oral BID  . feeding supplement (ENSURE ENLIVE)  237 mL Oral BID BM  . latanoprost  1 drop Both Eyes QHS  . multivitamin with minerals  1 tablet Oral Daily  . pantoprazole  40 mg Oral Daily  . potassium chloride  40 mEq Oral Once  . propranolol  10 mg Oral BID  . sodium chloride flush  3 mL Intravenous Q12H   Continuous Infusions: . cefTRIAXone (ROCEPHIN)  IV Stopped (06/22/19 2301)   PRN Meds: albuterol, ALPRAZolam, ondansetron **OR** ondansetron (ZOFRAN) IV, traMADol  Time spent: 35 minutes  Author: Lynden Oxford, MD [facility] 06/23/2019 8:25 AM  To  reach On-call, see care teams to locate the attending and reach out to them via [email or web address]. If 7PM-7AM, please contact night-coverage If you still have difficulty reaching the attending provider, please page the [facility] (Director on Call) for [facility] on amion for assistance.

## 2019-06-23 NOTE — NC FL2 (Signed)
[place] MEDICAID FL2 LEVEL OF CARE SCREENING TOOL     IDENTIFICATION  Patient Name: Charlene Dickerson Birthdate: 11/10/1932 Sex: female Admission Date (Current Location): 06/19/2019  [facility] [place] Number:  Producer, television/film/video and Address:  [facility] [place], [address] [place], [place] [address]      Provider Number: [id number]  Attending Physician Name and Address:  Rolly Salter, MD  Relative Name and Phone Number:  [name] (daughter) [phone]    Current Level of Care: Hospital Recommended Level of Care: Skilled Nursing Facility Prior Approval Number:    Date Approved/Denied:   PASRR Number: [id number]  Discharge Plan: SNF    Current Diagnoses: Patient Active Problem List   Diagnosis Date Noted  . Malnutrition of moderate degree 06/21/2019  . AKI (acute kidney injury) (HCC) 06/20/2019  . Hyperkalemia 06/20/2019  . Metabolic acidosis 06/20/2019  . Uremia 06/20/2019  . Closed compression fracture of L4 lumbar vertebra, initial encounter (HCC) 06/20/2019  . CAD (coronary artery disease) 06/20/2019  . Dehydration 06/20/2019  . Chronic diastolic CHF (congestive heart failure) (HCC) 06/20/2019  . Essential hypertension 06/20/2019  . Low back pain 06/01/2019  . Paroxysmal atrial fibrillation (HCC)   . Persistent atrial fibrillation (HCC) 03/12/2018  . Shock circulatory (HCC)   . Hemodynamic instability   . Central venous catheter in place   . STEMI (ST elevation myocardial infarction) (HCC) 02/18/2018  . Acute myocardial infarction (HCC) 02/18/2018  . Complete heart block (HCC)   . Rectocele 11/26/2017  . Cystocele with rectocele 11/26/2017  . Diffuse cystic mastopathy of breast 05/09/2011    Orientation RESPIRATION BLADDER Height & Weight     Self, Time, Situation, Place  O2(2 L nasal cannula) Incontinent, External catheter Weight: 145 lb 11.6 oz (66.1 kg) Height:  5\' 2"  (157.5 cm)  BEHAVIORAL SYMPTOMS/MOOD NEUROLOGICAL BOWEL  NUTRITION STATUS      Continent Diet(see discharge summary)  AMBULATORY STATUS COMMUNICATION OF NEEDS Skin   Limited Assist Verbally Normal                       Personal Care Assistance Level of Assistance  Bathing, Dressing, Total care, Feeding Bathing Assistance: Limited assistance Feeding assistance: Independent Dressing Assistance: Limited assistance Total Care Assistance: Limited assistance   Functional Limitations Info  Sight, Hearing, Speech Sight Info: Adequate Hearing Info: Adequate Speech Info: Adequate    SPECIAL CARE FACTORS FREQUENCY  PT (By licensed PT), OT (By licensed OT)     PT Frequency: min 5x weekly OT Frequency: min 5x weekly            Contractures Contractures Info: Not present    Additional Factors Info  Code Status, Allergies Code Status Info: full Allergies Info: Clindamycin/lincomycin           Current Medications (06/23/2019):  This is the current hospital active medication list Current Facility-Administered Medications  Medication Dose Route Frequency Provider Last Rate Last Admin  . albuterol (PROVENTIL) (2.5 MG/3ML) 0.083% nebulizer solution 2.5 mg  2.5 mg Nebulization Q2H PRN Doutova, Anastassia, MD      . ALPRAZolam 06/25/2019) tablet 0.125-0.25 mg  0.125-0.25 mg Oral BID PRN Opyd, 04-11-1986, MD   0.25 mg at 06/22/19 2218  . atorvastatin (LIPITOR) tablet 40 mg  40 mg Oral q1800 2219, MD   40 mg at 06/22/19 1604  . cefTRIAXone (ROCEPHIN) 1 g in sodium chloride 0.9 % 100 mL IVPB  1 g Intravenous Q24H  Toy Baker, MD   Stopped at 06/22/19 2301  . clopidogrel (PLAVIX) tablet 75 mg  75 mg Oral Daily Earley Abide M, PA-C   75 mg at 06/23/19 4174  . docusate sodium (COLACE) capsule 100 mg  100 mg Oral BID Toy Baker, MD   100 mg at 06/23/19 0933  . feeding supplement (ENSURE ENLIVE) (ENSURE ENLIVE) liquid 237 mL  237 mL Oral BID BM Kc, Ramesh, MD   237 mL at 06/23/19 0942  . latanoprost (XALATAN) 0.005 %  ophthalmic solution 1 drop  1 drop Both Eyes QHS Doutova, Anastassia, MD   1 drop at 06/22/19 2218  . multivitamin with minerals tablet 1 tablet  1 tablet Oral Daily Antonieta Pert, MD   1 tablet at 06/23/19 0933  . ondansetron (ZOFRAN) tablet 4 mg  4 mg Oral Q6H PRN Doutova, Anastassia, MD       Or  . ondansetron (ZOFRAN) injection 4 mg  4 mg Intravenous Q6H PRN Toy Baker, MD   4 mg at 06/22/19 0114  . pantoprazole (PROTONIX) EC tablet 40 mg  40 mg Oral Daily Lavina Hamman, MD   40 mg at 06/23/19 0933  . propranolol (INDERAL) tablet 10 mg  10 mg Oral BID Toy Baker, MD   10 mg at 06/23/19 0933  . sodium chloride flush (NS) 0.9 % injection 3 mL  3 mL Intravenous Q12H Toy Baker, MD   3 mL at 06/23/19 0942  . traMADol (ULTRAM) tablet 50 mg  50 mg Oral Q6H PRN Lavina Hamman, MD         Discharge Medications: Please see discharge summary for a list of discharge medications.  Relevant Imaging Results:  Relevant Lab Results:   Additional Information SSN: [id number]  Alberteen Sam, LCSW

## 2019-06-23 NOTE — TOC Progression Note (Addendum)
Transition of Care [facility]) - Progression Note    Patient Details  Name: ASHAKI FROSCH MRN: [id number] Date of Birth: Jul 17, 1932  [facility] CM/SW [place], [place] Phone Number: 06/23/2019, 10:28 AM  Clinical Narrative:     Update 3:31 pm: preference for [facility], followed by [facility], with third choice of [facility]. CSW has reached out to [facility] for bed avail. Insurance Josem Kaufmann continues to pend at this time.   Update: Bed offers list provided to patient and daughter at bedside including Medicare.gov ratings, they will review and let CSW know of choice. CSW has initiated insurance auth with [facility] and faxed all clinicals.   CSW notes patient does not meet CIR qualifications after pre screen, CSW met with patient and daughter at bedside they are agreeable to SNF as back up and for CSW to fax out referrals. CSW has completed fl2 and faxed out to facilities for bed offers, pending bed offers at this time.   Expected Discharge Plan: (TBD) Barriers to Discharge: Continued Medical Work up  Expected Discharge Plan and Services Expected Discharge Plan: (TBD)       Living arrangements for the past 2 months: Single Family Home                                       Social Determinants of Health (SDOH) Interventions    Readmission Risk Interventions No flowsheet data found.

## 2019-06-24 ENCOUNTER — Other Ambulatory Visit: Payer: Self-pay | Admitting: Internal Medicine

## 2019-06-24 DIAGNOSIS — S32040A Wedge compression fracture of fourth lumbar vertebra, initial encounter for closed fracture: Secondary | ICD-10-CM

## 2019-06-24 LAB — CBC
HCT: 34.6 % — ABNORMAL LOW (ref 36.0–46.0)
Hemoglobin: 11.6 g/dL — ABNORMAL LOW (ref 12.0–15.0)
MCH: 33.5 pg (ref 26.0–34.0)
MCHC: 33.5 g/dL (ref 30.0–36.0)
MCV: 100 fL (ref 80.0–100.0)
Platelets: 146 10*3/uL — ABNORMAL LOW (ref 150–400)
RBC: 3.46 MIL/uL — ABNORMAL LOW (ref 3.87–5.11)
RDW: 12 % (ref 11.5–15.5)
WBC: 8.3 10*3/uL (ref 4.0–10.5)
nRBC: 0 % (ref 0.0–0.2)

## 2019-06-24 LAB — BASIC METABOLIC PANEL
Anion gap: 8 (ref 5–15)
BUN: 9 mg/dL (ref 8–23)
CO2: 28 mmol/L (ref 22–32)
Calcium: 8.5 mg/dL — ABNORMAL LOW (ref 8.9–10.3)
Chloride: 102 mmol/L (ref 98–111)
Creatinine, Ser: 0.69 mg/dL (ref 0.44–1.00)
GFR calc Af Amer: >60 mL/min (ref >60)
GFR calc non Af Amer: >60 mL/min (ref >60)
Glucose, Bld: 87 mg/dL (ref 70–99)
Potassium: 3.8 mmol/L (ref 3.5–5.1)
Sodium: 138 mmol/L (ref 135–145)

## 2019-06-24 MED ORDER — APIXABAN 5 MG PO TABS
5.0000 mg | ORAL_TABLET | Freq: Two times a day (BID) | ORAL | Status: DC
  Administered 2019-06-24: 5 mg via ORAL
  Filled 2019-06-24: qty 1

## 2019-06-24 MED ORDER — DOCUSATE SODIUM 100 MG PO CAPS: 100.0000 mg | ORAL_CAPSULE | Freq: Two times a day (BID) | ORAL | 0 refills | Status: DC

## 2019-06-24 MED ORDER — TRAMADOL HCL 50 MG PO TABS: 50.0000 mg | ORAL_TABLET | Freq: Four times a day (QID) | ORAL | 0 refills | Status: DC | PRN

## 2019-06-24 MED ORDER — CEPHALEXIN 500 MG PO CAPS: 500.0000 mg | ORAL_CAPSULE | Freq: Two times a day (BID) | ORAL | 0 refills | Status: AC

## 2019-06-24 MED ORDER — LIDOCAINE 5 % EX PTCH: 1.0000 | MEDICATED_PATCH | CUTANEOUS | 0 refills | Status: DC

## 2019-06-24 MED ORDER — ALPRAZOLAM 0.25 MG PO TABS: 0.1250 mg | ORAL_TABLET | Freq: Two times a day (BID) | ORAL | 0 refills | Status: DC | PRN

## 2019-06-24 MED ORDER — FUROSEMIDE 20 MG PO TABS: 40.0000 mg | ORAL_TABLET | Freq: Every day | ORAL | 0 refills | Status: DC | PRN

## 2019-06-24 MED ORDER — ENSURE ENLIVE PO LIQD: 237.0000 mL | Freq: Two times a day (BID) | ORAL | 12 refills | Status: DC

## 2019-06-24 MED ORDER — LIDOCAINE 5 % EX PTCH
1.0000 | MEDICATED_PATCH | CUTANEOUS | Status: DC
  Administered 2019-06-24: 1 via TRANSDERMAL
  Filled 2019-06-24: qty 1

## 2019-06-24 MED ORDER — ASPIRIN 81 MG PO TBEC: 81.0000 mg | DELAYED_RELEASE_TABLET | Freq: Every day | ORAL | 0 refills | Status: DC

## 2019-06-24 NOTE — Progress Notes (Signed)
ANTICOAGULATION CONSULT NOTE   Pharmacy Consult for apixaban Indication: atrial fibrillation  Allergies  Allergen Reactions  . Clindamycin/Lincomycin Diarrhea    "c-diff"    Patient Measurements: Height: 5\' 2"  (157.5 cm) Weight: 66.1 kg (145 lb 11.6 oz) IBW/kg (Calculated) : 50.1   Vital Signs: Temp: 98.1 F (36.7 C) (04/23 0743) Temp Source: Oral (04/23 0743) BP: 129/87 (04/23 0743) Pulse Rate: 73 (04/23 0743)  Labs: Recent Labs    06/22/19 0241 06/22/19 0241 06/23/19 0343 06/24/19 0222  HGB 11.2*   < > 11.8* 11.6*  HCT 34.3*  --  35.2* 34.6*  PLT 148*  --  143* 146*  LABPROT  --   --  13.7  --   INR  --   --  1.1  --   CREATININE 1.17*  --  0.87 0.69   < > = values in this interval not displayed.    Estimated Creatinine Clearance: 46.7 mL/min (by C-G formula based on SCr of 0.69 mg/dL).   Medical History: Past Medical History:  Diagnosis Date  . Diverticulosis of colon   . Essential tremor   . GERD (gastroesophageal reflux disease)    occasional (no meds)  . History of basal cell carcinoma excision 2012  . Hypertension   . Osteoporosis   . PONV (postoperative nausea and vomiting)   . Rectocele    symptomatic    Medications:  Medications Prior to Admission  Medication Sig Dispense Refill Last Dose  . acetaminophen (TYLENOL) 650 MG CR tablet Take 1,300 mg by mouth as needed for pain.   06/18/2019  . ALPRAZolam (XANAX) 0.5 MG tablet Take 0.25-0.5 tablets by mouth 2 (two) times daily as needed for anxiety.    06/18/2019  . apixaban (ELIQUIS) 5 MG TABS tablet TAKE 1 TABLET(5 MG) BY MOUTH TWICE DAILY (Patient taking differently: Take 5 mg by mouth 2 (two) times daily. ) 180 tablet 1 06/18/2019 at 1800  . atorvastatin (LIPITOR) 40 MG tablet TAKE 1 TABLET(40 MG) BY MOUTH DAILY AT 6 PM (Patient taking differently: Take 40 mg by mouth daily at 6 PM. ) 90 tablet 3 06/18/2019  . calcium citrate (CALCITRATE - DOSED IN MG ELEMENTAL CALCIUM) 950 MG tablet Take 200 mg  of elemental calcium by mouth daily.   06/18/2019  . clopidogrel (PLAVIX) 75 MG tablet Take 1 tablet (75 mg total) by mouth daily. 90 tablet 3 06/18/2019 at 0700  . famotidine (PEPCID AC) 10 MG tablet Take 10 mg by mouth as needed for heartburn or indigestion.   unknown  . furosemide (LASIX) 20 MG tablet Take 2 tablets (40 mg total) by mouth 2 (two) times daily. 360 tablet 3 06/18/2019  . latanoprost (XALATAN) 0.005 % ophthalmic solution Place 1 drop into both eyes at bedtime.    06/18/2019  . loratadine (CLARITIN) 10 MG tablet Take 10 mg by mouth daily.   06/18/2019  . Multiple Vitamin (MULTIVITAMIN WITH MINERALS) TABS tablet Take 1 tablet by mouth daily.   06/18/2019  . nitroGLYCERIN (NITROSTAT) 0.4 MG SL tablet Place 1 tablet (0.4 mg total) under the tongue every 5 (five) minutes as needed for chest pain. 25 tablet 3 unknown  . pantoprazole (PROTONIX) 40 MG tablet TAKE 1 TABLET(40 MG) BY MOUTH AT BEDTIME (Patient taking differently: Take 40 mg by mouth at bedtime as needed (acid reflux). ) 90 tablet 1 unknown  . potassium chloride (KLOR-CON) 10 MEQ tablet TAKE 2 TABLETS BY MOUTH TWICE DAILY (Patient taking differently: Take 20 mEq by mouth  2 (two) times daily. ) 120 tablet 6 06/18/2019  . propranolol (INDERAL) 10 MG tablet Take 10 mg by mouth 2 (two) times daily. (2) 1 times daily    06/18/2019 at 1800  . simethicone (MYLICON) 80 MG chewable tablet Chew 160 mg by mouth every 6 (six) hours as needed for flatulence.   unknown  . Vitamin D, Ergocalciferol, (DRISDOL) 1.25 MG (50000 UT) CAPS capsule Take 50,000 Units by mouth every Wednesday.    06/15/2019   Scheduled:  . aspirin EC  81 mg Oral Daily  . atorvastatin  40 mg Oral q1800  . cephALEXin  500 mg Oral Q12H  . docusate sodium  100 mg Oral BID  . feeding supplement (ENSURE ENLIVE)  237 mL Oral BID BM  . latanoprost  1 drop Both Eyes QHS  . multivitamin with minerals  1 tablet Oral Daily  . pantoprazole  40 mg Oral Daily  . propranolol  10 mg Oral  BID  . sodium chloride flush  3 mL Intravenous Q12H    Assessment: 86 yo female on apixaban PTA for afib. This was on hold initially for AKI and has been on hold for procedures.  A kyphoplasty is planned 4/27 and plans are to start apixaban with last last planned on 4/25.  -hg= 11.6, plt= 146  Goal of Therapy:  Monitor platelets by anticoagulation protocol: Yes   Plan:  -apixaban 5mg  po bid -Stop after last dose on 4/25 -Will follow plans post procedure  Hildred Laser, PharmD Clinical Pharmacist **Pharmacist phone directory can now be found on [email or web address] ([email or web address]).  Listed under [facility].

## 2019-06-24 NOTE — TOC Transition Note (Signed)
Transition of Care [facility]) - CM/SW Discharge Note   Patient Details  Name: Charlene Dickerson MRN: [id number] Date of Birth: 1933/02/24  Transition of [facility]) CM/SW Contact:  Gildardo Griffes, LCSW Phone Number: 06/24/2019, 1:03 PM   Clinical Narrative:      Patient will DC to: [facility] Anticipated DC date: 06/24/19 Family notified: [name] Transport by: Sharin Mons  Per MD patient ready for DC to [place] . RN, patient, patient's family, and facility notified of DC. Discharge Summary sent to facility. RN given number for report [phone]  . DC packet on chart. Ambulance transport requested for patient.  CSW signing off.  [place], [place] [phone]   Final next level of care: Skilled Nursing Facility Barriers to Discharge: No Barriers Identified   Patient Goals and CMS Choice Patient states their goals for this hospitalization and ongoing recovery are:: to go to rehab CMS Medicare.gov Compare Post Acute Care list provided to:: Patient Choice offered to / list presented to : Patient  Discharge Placement PASRR number recieved: 06/23/19            Patient [facility] Patient to be transferred to facility by: [facility] Name of family member notified: [name] Patient and family notified of of transfer: 06/24/19  Discharge Plan and Services                                     Social Determinants of Health (SDOH) Interventions     Readmission Risk Interventions No flowsheet data found.

## 2019-06-24 NOTE — Progress Notes (Signed)
Physical Therapy Treatment Patient Details Name: Charlene Dickerson MRN: [id number] DOB: 03-01-1933 Today's Date: 06/24/2019    History of Present Illness 86 yo admitted with fatigue and lower back pain pt with L4 fx, aKI with metabolic acidosis and severe hyperkalemia with episode of tachycardia in ED s/p shock. Pt transferred from [facility] to [facility] for urgent HD. PMhx: gERD, HTN, PAF, CAD    PT Comments    Pt supine on arrival, pleasant and willing to move. Pt with assist for pericare after toileting, cues for posture at sink and with gait. Pt able to recall precautions but continues to demonstrate memory and problem solving deficits as well as need for physical support for balance in standing. Daughter present throughout with plan still appropriate.   Hr 74-80 SpO2 95% on RA    Follow Up Recommendations  SNF;Supervision/Assistance - 24 hour     Equipment Recommendations  Rolling walker with 5" wheels    Recommendations for Other Services       Precautions / Restrictions Precautions Precautions: Back;Fall Precaution Comments: reviewed back precautions throughout. Pt able to state all Restrictions Weight Bearing Restrictions: No    Mobility  Bed Mobility Overal bed mobility: Needs Assistance Bed Mobility: Rolling;Sidelying to Sit Rolling: Supervision Sidelying to sit: Supervision       General bed mobility comments: cues to initiate without physical assist  Transfers Overall transfer level: Needs assistance   Transfers: Sit to/from Stand Sit to Stand: Min guard         General transfer comment: guarding for safety from bed and toilet with cues for hand placement  Ambulation/Gait Ambulation/Gait assistance: Min assist Gait Distance (Feet): 325 Feet Assistive device: 1 person hand held assist Gait Pattern/deviations: Step-through pattern;Decreased stride length   Gait velocity interpretation: >2.62 ft/sec, indicative of community ambulatory General Gait Details: cues  for direction, posture, increased stride and safety. Performed gait without RW with pt reliant on external support and need for physical assist throughout gait for stability and balance   Stairs             Wheelchair Mobility    Modified Rankin (Stroke Patients Only)       Balance Overall balance assessment: Needs assistance Sitting-balance support: No upper extremity supported;Feet supported Sitting balance-Leahy Scale: Good     Standing balance support: Single extremity supported Standing balance-Leahy Scale: Fair Standing balance comment: able to stand at sink without UE support                            Cognition Arousal/Alertness: Awake/alert Behavior During Therapy: WFL for tasks assessed/performed Overall Cognitive Status: Impaired/Different from baseline Area of Impairment: Memory;Problem solving;Awareness;Orientation;Safety/judgement                 Orientation Level: Disoriented to;Time   Memory: Decreased short-term memory   Safety/Judgement: Decreased awareness of deficits Awareness: Emergent Problem Solving: Slow processing;Difficulty sequencing;Requires verbal cues General Comments: pt able to recall precautions, difficulty recalling activities of the day      Exercises General Exercises - Lower Extremity Long Arc Quad: AROM;Both;Seated;20 reps Hip ABduction/ADduction: AROM;Both;Seated;20 reps Hip Flexion/Marching: Strengthening;Both;20 reps;Seated;AROM    General Comments        Pertinent Vitals/Pain Pain Score: 3  Pain Location: back pain with activity Pain Descriptors / Indicators: Aching;Sore Pain Intervention(s): Limited activity within patient's tolerance;Repositioned;Monitored during session    Home Living  Prior Function            PT Goals (current goals can now be found in the care plan section) Progress towards PT goals: Progressing toward goals    Frequency    Min  3X/week      PT Plan Current plan remains appropriate    Co-evaluation              AM-PAC PT "6 Clicks" Mobility   Outcome Measure  Help needed turning from your back to your side while in a flat bed without using bedrails?: A Little Help needed moving from lying on your back to sitting on the side of a flat bed without using bedrails?: A Little Help needed moving to and from a bed to a chair (including a wheelchair)?: A Little Help needed standing up from a chair using your arms (e.g., wheelchair or bedside chair)?: A Little Help needed to walk in hospital room?: A Little Help needed climbing 3-5 steps with a railing? : A Little 6 Click Score: 18    End of Session Equipment Utilized During Treatment: Gait belt Activity Tolerance: Patient tolerated treatment well Patient left: in chair;with call bell/phone within reach;with family/visitor present Nurse Communication: Mobility status PT Visit Diagnosis: Other abnormalities of gait and mobility (R26.89);Difficulty in walking, not elsewhere classified (R26.2);Muscle weakness (generalized) (M62.81)     Time: 4680-3212 PT Time Calculation (min) (ACUTE ONLY): 24 min  Charges:  $Gait Training: 8-22 mins $Therapeutic Exercise: 8-22 mins                     Tamon Parkerson P, PT Acute Rehabilitation Services Pager: [phone] Office: [phone]    Deneshia Zucker B Jordy Verba 06/24/2019, 1:18 PM

## 2019-06-24 NOTE — Discharge Summary (Addendum)
[facility] Discharge Summary   Patient: Charlene Dickerson:[id number]  PCP: Martha Clan, MD  Date of admission: 06/19/2019   Date of discharge:  06/24/2019     Discharge Diagnoses:  Principal diagnosis Ventricular tachycardia secondary to hyperkalemia  Active Problems:   Paroxysmal atrial fibrillation (HCC)   AKI (acute kidney injury) (HCC)   Hyperkalemia   Metabolic acidosis   Uremia   Closed compression fracture of L4 lumbar vertebra, initial encounter (HCC)   CAD (coronary artery disease)   Dehydration   Chronic diastolic CHF (congestive heart failure) (HCC)   Essential hypertension   Malnutrition of moderate degree   Admitted From: Home Disposition:  SNF with therapy  Recommendations for Outpatient Follow-up:  1. PCP: Follow-up with PCP in 1 week.  Follow-up with IR for kyphoplasty.  Follow-up with cardiology as recommended 2. Follow up LABS/TEST: BMP   Contact information for follow-up providers    Nahser, Deloris Ping, MD Follow up on 07/15/2019.   Specialty: Cardiology Why: 3:00 pm for hospital follow up Contact information: [address] [place] [address] [phone]        Martha Clan, MD. Schedule an appointment as soon as possible for a visit in 1 week(s).   Specialty: Internal Medicine Contact information: [address] [place] [address] [phone]        Valeria Batman, MD. Schedule an appointment as soon as possible for a visit in 1 month(s).   Specialty: Orthopedic Surgery Contact information: [address] [place] [address] [phone]        [facility] INTER[facility] RADIOLOGY Follow up.   Specialty: Radiology Contact information: [address] [id number] [place] [address] [place] [address] [phone]           Contact information for after-discharge care    Destination    [facility] .   Service: Skilled Nursing Contact  information: [address] [place] [address] [phone]                 Diet recommendation: Cardiac diet  Activity: The patient is advised to gradually reintroduce usual activities, as tolerated  Discharge Condition: stable  Code Status: Full code   History of present illness: As per the H and P dictated on admission, "Charlene Dickerson is a 86 y.o. female with medical history significant of GERD, hypertension osteoporosis paroxysmal atrial fibrillation history of MI  Sp stenting in 2019 December    Presented with  1 wk of fatigue she has history of CHF with preserved EF and been taking Lasix as well as potassium at home.  She recently been seen by orthopedics for left-sided low back pain she has been taking Tylenol 1 to 2 tablets 3 times a day as well as Advil repeatedly throughout the day for past week to control her severe back pain.  She also have had decreased p.o. intake decrease drinking she has been feeling fairly lightheaded getting confused family unsure if she actually been taking her medications appropriately or not. Denies dysuria but has some urgency to urinate"  Hospital Course:  Summary of her active problems in the hospital is as following. 1.  Acute kidney injury. Metabolic acidosis. Hyperkalemia Uremia Dehydration SP urgent hemodialysis for V. tach and hyperkalemia. Appreciate nephrology input. HD catheter now removed. Renal function has returned back to normal. Suspecting AKI secondary to poor intake, diuretic and ongoing use of anesthetics for back pain.  2.  Wide-complex tachycardia SP  synchronized cardioversion in the ED SP amiodarone. Secondary to hyperkalemia. Continue to monitor on telemetry. Cardiology was consulted  no further work-up recommended for now. Echocardiogram showed preserved EF.  No wall motion abnormality.  3.  Klebsiella UTI Treated with IV ceftriaxone. Switch to oral antibiotic.  Monitor.  4.   Paroxysmal A. fib On Eliquis and propranolol. Continuing the same for now. Eliquis is currently on hold.  Will resume it tomorrow.  5.  L4 compression fracture Patient has been following up with orthopedic outpatient. Initial plan was epidural injection in May. In the hospital orthopedic recommended to consider kyphoplasty. IR was consulted for kyphoplasty. Patient was on Plavix therefore unable to perform kyphoplasty for 5 days until patient is off of the medication. Currently Plavix discontinued and transition to aspirin 81 mg. Patient will have to be off of Eliquis for 48 hours before procedure as well. Per radiology patient also has to improve from UTI prior to considering procedure. Patient does report pain 5 out of 10 and has not been using pain medication as she is worried about side effects.  6.  CAD SP PCI December 2019 PCI in 2019. No further chest pain right now. Currently recommend no further work-up for now. Given patient's stability patient currently being transitioning from Plavix to aspirin. Which will help with minimizing her bleeding risk as well.  7.  Left hip pain. Secondary to osteoarthritis. Monitor.  8.  Chronic diastolic CHF. Currently euvolemic. Monitor.  9.  Essential hypertension Blood pressure stable. Monitor.  10.  Moderate protein calorie malnutrition. Body mass index is 26.65 kg/m.  Nutrition Problem: Moderate Malnutrition Etiology: chronic illness(CHF) Nutrition Interventions: Interventions: Ensure Enlive (each supplement provides 350kcal and 20 grams of protein), MVI  Pain control  - [place] Controlled Substance Reporting System database was reviewed. - 5 day supply was provided. - Patient was instructed, not to drive, operate heavy machinery, perform activities at heights, swimming or participation in water activities or provide baby sitting services while on Pain, Sleep and Anxiety Medications; until her outpatient  Physician has advised to do so again.  - Also recommended to not to take more than prescribed Pain, Sleep and Anxiety Medications.  Patient was seen by physical therapy, who recommended SNF, which was arranged. On the day of the discharge the patient's vitals were stable, and no other acute medical condition were reported by patient. the patient was felt safe to be discharge at SNF with therapy.  Consultants: Cardiology, nephrology Procedures: Urgent hemodialysis, HD catheter placement  Discharge Exam: General: Appear in mild distress, no Rash; Oral Mucosa Clear, moist. Cardiovascular: S1 and S2 Present, no Murmur, Respiratory: normal respiratory effort, Bilateral Air entry present and no Crackles, no wheezes Abdomen: Bowel Sound present, Soft and no tenderness, no hernia Extremities: no Pedal edema, no calf tenderness Neurology: alert and oriented to time, place, and person affect appropriate.  Filed Weights   06/20/19 0421 06/21/19 0410  Weight: 66.5 kg 66.1 kg   Vitals:   06/24/19 0317 06/24/19 0743  BP: 138/84 129/87  Pulse: 70 73  Resp: (!) 23 16  Temp: 98 F (36.7 C) 98.1 F (36.7 C)  SpO2: 92% 92%    DISCHARGE MEDICATION: Allergies as of 06/24/2019      Reactions   Clindamycin/lincomycin Diarrhea   "c-diff"      Medication List    STOP taking these medications   clopidogrel 75 MG tablet Commonly known as: PLAVIX   potassium chloride 10 MEQ tablet Commonly known as:  KLOR-CON     TAKE these medications   acetaminophen 650 MG CR tablet Commonly known as: TYLENOL Take 1,300 mg by mouth as needed for pain.   ALPRAZolam 0.25 MG tablet Commonly known as: XANAX Take 0.5-1 tablets (0.125-0.25 mg total) by mouth 2 (two) times daily as needed for anxiety. What changed: medication strength   apixaban 5 MG Tabs tablet Commonly known as: Eliquis TAKE 1 TABLET(5 MG) BY MOUTH TWICE DAILY What changed:   how much to take  how to take this  when to take  this  additional instructions   aspirin 81 MG EC tablet Take 1 tablet (81 mg total) by mouth daily. Start taking on: June 25, 2019   atorvastatin 40 MG tablet Commonly known as: LIPITOR TAKE 1 TABLET(40 MG) BY MOUTH DAILY AT 6 PM What changed:   how much to take  how to take this  when to take this  additional instructions   calcium citrate 950 (200 Ca) MG tablet Commonly known as: CALCITRATE - dosed in mg elemental calcium Take 200 mg of elemental calcium by mouth daily.   cephALEXin 500 MG capsule Commonly known as: KEFLEX Take 1 capsule (500 mg total) by mouth 2 (two) times daily for 4 days.   docusate sodium 100 MG capsule Commonly known as: COLACE Take 1 capsule (100 mg total) by mouth 2 (two) times daily.   feeding supplement (ENSURE ENLIVE) Liqd Take 237 mLs by mouth 2 (two) times daily between meals.   furosemide 20 MG tablet Commonly known as: LASIX Take 2 tablets (40 mg total) by mouth daily as needed for up to 365 doses for fluid or edema (weight gain of 3lbs in 1 days or 5 lbs in 2 days). What changed:   when to take this  reasons to take this   latanoprost 0.005 % ophthalmic solution Commonly known as: XALATAN Place 1 drop into both eyes at bedtime.   lidocaine 5 % Commonly known as: LIDODERM Place 1 patch onto the skin daily. Remove & Discard patch within 12 hours or as directed by MD Start taking on: June 25, 2019   loratadine 10 MG tablet Commonly known as: CLARITIN Take 10 mg by mouth daily.   multivitamin with minerals Tabs tablet Take 1 tablet by mouth daily.   nitroGLYCERIN 0.4 MG SL tablet Commonly known as: Nitrostat Place 1 tablet (0.4 mg total) under the tongue every 5 (five) minutes as needed for chest pain.   pantoprazole 40 MG tablet Commonly known as: PROTONIX TAKE 1 TABLET(40 MG) BY MOUTH AT BEDTIME What changed: See the new instructions.   Pepcid AC 10 MG tablet Generic drug: famotidine Take 10 mg by mouth as  needed for heartburn or indigestion.   propranolol 10 MG tablet Commonly known as: INDERAL Take 10 mg by mouth 2 (two) times daily. (2) 1 times daily   simethicone 80 MG chewable tablet Commonly known as: MYLICON Chew 160 mg by mouth every 6 (six) hours as needed for flatulence.   traMADol 50 MG tablet Commonly known as: ULTRAM Take 1 tablet (50 mg total) by mouth every 6 (six) hours as needed for severe pain.   Vitamin D (Ergocalciferol) 1.25 MG (50000 UNIT) Caps capsule Commonly known as: DRISDOL Take 50,000 Units by mouth every Wednesday.      Allergies  Allergen Reactions   Clindamycin/Lincomycin Diarrhea    "c-diff"   Discharge Instructions    Ambulatory referral to Interventional Radiology   Complete by: As directed  Diet - low sodium heart healthy   Complete by: As directed    Discharge instructions   Complete by: As directed    It is important that you read the given instructions as well as go over your medication list with RN to help you understand your care after this hospitalization.  Please follow-up with PCP in 1-2 weeks.  Please note that NO REFILLS for any discharge medications will be authorized once you are discharged, as it is imperative that you return to your primary care physician (or establish a relationship with a primary care physician if you do not have one) for your aftercare needs so that they can reassess your need for medications and monitor your lab values.  Please request your primary care physician to go over all Hospital Tests and Procedure/Radiological results at the follow up. Please get all Hospital records sent to your PCP by signing hospital release before you go home.   Do not drive, operating heavy machinery, perform activities at heights, swimming or participation in water activities or provide baby sitting services while you are on Pain, Sleep and Anxiety Medications; until you have been seen by Primary Care Physician or a  Neurologist and are cleared to do such activities.  Do not take more than prescribed Pain, Sleep and Anxiety Medications.  You were cared for by a hospitalist during your hospital stay. If you have any questions about your discharge medications or the care you received while you were in the hospital after you are discharged, you can call the unit @UNIT @ you were admitted to and ask to speak with the hospitalist Lynden Oxford. Ask for Hospitalist on call if the hospitalist that took care of you is not available.   Once you are discharged, your primary care physician will handle any further medical issues.  You Must read complete instructions/literature along with all the possible adverse reactions/side effects for all the Medicines you take and that have been prescribed to you. Take any new Medicines after you have completely understood and accept all the possible adverse reactions/side effects.   Increase activity slowly   Complete by: As directed       The results of significant diagnostics from this hospitalization (including imaging, microbiology, ancillary and laboratory) are listed below for reference.    Significant Diagnostic Studies: CT ABDOMEN PELVIS WO CONTRAST  Result Date: 06/20/2019 CLINICAL DATA:  Lower back pain.  Pancreatitis suspected EXAM: CT ABDOMEN AND PELVIS WITHOUT CONTRAST TECHNIQUE: Multidetector CT imaging of the abdomen and pelvis was performed following the standard protocol without IV contrast. COMPARISON:  None. FINDINGS: Lower chest: Densely calcified visualized coronary arteries. Lung bases clear. No effusions. Hepatobiliary: No focal hepatic abnormality. Gallbladder unremarkable. Pancreas: No focal abnormality or ductal dilatation. Spleen: No focal abnormality.  Normal size. Adrenals/Urinary Tract: 4.6 cm cyst in the midpole of the left kidney. No hydronephrosis. Adrenal glands and urinary bladder unremarkable. Stomach/Bowel: Diffuse colonic diverticulosis, most  pronounced in the sigmoid colon. No active diverticulitis. No evidence of bowel obstruction. Vascular/Lymphatic: Aortic atherosclerosis. No enlarged abdominal or pelvic lymph nodes. Reproductive: Uterus and adnexa unremarkable.  No mass. Other: No free fluid or free air. Musculoskeletal: Moderate compression fracture through the superior endplate of L4 appears acute. Diffuse degenerative disc and facet disease. IMPRESSION: Diffuse colonic diverticulosis.  No active diverticulitis. Aortic atherosclerosis. Moderate L4 compression fracture, which appears acute. Coronary artery disease. Electronically Signed   By: Charlett Nose M.D.   On: 06/20/2019 00:05   DG Chest 2 View  Result Date: 06/19/2019 CLINICAL DATA:  Fatigue, generalized weakness. EXAM: CHEST - 2 VIEW COMPARISON:  Chest x-ray dated 02/20/2018. FINDINGS: Heart size and mediastinal contours are stable. Lungs are clear. No pleural effusion is seen. Osseous structures about the chest are unremarkable. IMPRESSION: No active cardiopulmonary disease. No evidence of pneumonia or pulmonary edema. Electronically Signed   By: Bary Richard M.D.   On: 06/19/2019 20:22   MR Lumbar Spine w/o contrast  Result Date: 06/13/2019 CLINICAL DATA:  Low back pain going to left groin EXAM: MRI LUMBAR SPINE WITHOUT CONTRAST TECHNIQUE: Multiplanar, multisequence MR imaging of the lumbar spine was performed. No intravenous contrast was administered. COMPARISON:  Lumbar spine radiograph 06/01/2019 FINDINGS: Segmentation:  Standard. Alignment:  Trace retrolisthesis at L2-L3. Vertebrae: Mild loss of height at the superior endplate of L4 appear similar to 06/01/2019 radiograph. Marrow edema is present at L4. There is degenerative endplate marrow edema at L2-L3, L3-L4, and L5-S1. Minor loss of height at the superior endplate of L2 without underlying marrow edema. There is no suspicious osseous lesion. Conus medullaris and cauda equina: Conus extends to the L1-L2 level. Conus and  cauda equina appear normal. Paraspinal and other soft tissues: Partially imaged T2 hyperintense lesion of the left kidney statistically likely reflects a cyst. Disc levels: L1-L2:  Trace disc bulge.  No canal or foraminal stenosis. L2-L3: Mild disc bulge slightly eccentric to the left and facet arthropathy with left joint effusion. No canal stenosis. Partial effacement of the left lateral recess. No foraminal stenosis. L3-L4: Disc bulge and superimposed left foraminal protrusion with endplate osteophytic ridging. Mild facet arthropathy with ligamentum flavum infolding. No canal or right foraminal stenosis. Mild left foraminal stenosis. L4-L5: Disc bulge with endplate osteophytic ridging and moderate facet arthropathy with ligamentum flavum infolding. Minor canal stenosis with slight effacement of the lateral recesses. Mild right foraminal stenosis. No left foraminal stenosis. L5-S1: Disc bulge with endplate osteophytic ridging and mild facet arthropathy. No canal or left foraminal stenosis. Minor right foraminal stenosis. Far lateral component of disc bulge contacts the extraforaminal right L5 nerve root. IMPRESSION: Degenerative changes as detailed above. There is multilevel marrow edema, potentially a source of pain. Superimposed recent L4 compression fracture is possible, noting vertebral body height is similar to 06/01/2019 radiograph. There is no high-grade canal or left foraminal stenosis. Narrowing of the left lateral recess at L2-L3. Electronically Signed   By: Guadlupe Spanish M.D.   On: 06/13/2019 08:33   ECHOCARDIOGRAM COMPLETE  Result Date: 06/20/2019    ECHOCARDIOGRAM REPORT   Patient Name:   JOANN JORGE Tieszen Date of Exam: 06/20/2019 Medical Rec #:  [id number]      Height:       62.0 in Accession #:    [id number]     Weight:       146.6 lb Date of Birth:  May 28, 1932      BSA:          1.675 m Patient Age:    86 years       BP:           100/57 mmHg Patient Gender: F              HR:           79 bpm. Exam  Location:  Inpatient Procedure: 2D Echo, Cardiac Doppler and Color Doppler Indications:    Ventricular tachycardia  History:        Patient has prior history of Echocardiogram examinations, most  recent 02/19/2018. Acute MI, Arrythmias:Atrial Fibrillation;                 Risk Factors:Hypertension. AKI.  Sonographer:    Lavenia Atlas Referring Phys: [phone] ANASTASSIA DOUTOVA IMPRESSIONS  1. Left ventricular ejection fraction, by estimation, is 60 to 65%. The left ventricle has normal function. The left ventricle has no regional wall motion abnormalities. Left ventricular diastolic parameters are consistent with Grade I diastolic dysfunction (impaired relaxation).  2. Right ventricular systolic function is normal. The right ventricular size is normal. There is mildly elevated pulmonary artery systolic pressure. The estimated right ventricular systolic pressure is 32.4 mmHg.  3. The mitral valve is normal in structure. No evidence of mitral valve regurgitation. No evidence of mitral stenosis.  4. Tricuspid valve regurgitation is moderate.  5. The aortic valve is normal in structure. Aortic valve regurgitation is moderate. No aortic stenosis is present.  6. The inferior vena cava is normal in size with greater than 50% respiratory variability, suggesting right atrial pressure of 3 mmHg. FINDINGS  Left Ventricle: Left ventricular ejection fraction, by estimation, is 60 to 65%. The left ventricle has normal function. The left ventricle has no regional wall motion abnormalities. The left ventricular internal cavity size was normal in size. There is  no left ventricular hypertrophy. Left ventricular diastolic parameters are consistent with Grade I diastolic dysfunction (impaired relaxation). Right Ventricle: The right ventricular size is normal. No increase in right ventricular wall thickness. Right ventricular systolic function is normal. There is mildly elevated pulmonary artery systolic pressure. The  tricuspid regurgitant velocity is 2.71  m/s, and with an assumed right atrial pressure of 3 mmHg, the estimated right ventricular systolic pressure is 32.4 mmHg. Left Atrium: Left atrial size was normal in size. Right Atrium: Right atrial size was normal in size. Pericardium: There is no evidence of pericardial effusion. Mitral Valve: The mitral valve is normal in structure. Normal mobility of the mitral valve leaflets. Moderate mitral annular calcification. No evidence of mitral valve regurgitation. No evidence of mitral valve stenosis. Tricuspid Valve: The tricuspid valve is normal in structure. Tricuspid valve regurgitation is moderate . No evidence of tricuspid stenosis. Aortic Valve: The aortic valve is normal in structure. Aortic valve regurgitation is moderate. No aortic stenosis is present. Pulmonic Valve: The pulmonic valve was normal in structure. Pulmonic valve regurgitation is not visualized. No evidence of pulmonic stenosis. Aorta: The aortic root is normal in size and structure. Venous: The inferior vena cava is normal in size with greater than 50% respiratory variability, suggesting right atrial pressure of 3 mmHg. IAS/Shunts: No atrial level shunt detected by color flow Doppler.  LEFT VENTRICLE PLAX 2D LVIDd:         5.00 cm  Diastology LVIDs:         4.20 cm  LV e' lateral:   5.77 cm/s LV PW:         1.00 cm  LV E/e' lateral: 11.2 LV IVS:        0.80 cm  LV e' medial:    5.44 cm/s LVOT diam:     1.90 cm  LV E/e' medial:  11.9 LV SV:         51 LV SV Index:   31 LVOT Area:     2.84 cm  RIGHT VENTRICLE RV Basal diam:  2.60 cm RV S prime:     17.30 cm/s TAPSE (M-mode): 2.9 cm LEFT ATRIUM  Index       RIGHT ATRIUM           Index LA diam:        4.50 cm 2.69 cm/m  RA Area:     15.80 cm LA Vol (A2C):   58.3 ml 34.80 ml/m RA Volume:   40.20 ml  24.00 ml/m LA Vol (A4C):   56.6 ml 33.78 ml/m LA Biplane Vol: 61.3 ml 36.59 ml/m  AORTIC VALVE LVOT Vmax:   86.70 cm/s LVOT Vmean:  54.500 cm/s  LVOT VTI:    0.181 m  AORTA Ao Root diam: 3.30 cm MITRAL VALVE                TRICUSPID VALVE MV Area (PHT): 3.72 cm     TR Peak grad:   29.4 mmHg MV Decel Time: 204 msec     TR Vmax:        271.00 cm/s MV E velocity: 64.90 cm/s MV A velocity: 118.00 cm/s  SHUNTS MV E/A ratio:  0.55         Systemic VTI:  0.18 m                             Systemic Diam: 1.90 cm Donato Schultz MD Electronically signed by Donato Schultz MD Signature Date/Time: 06/20/2019/3:26:45 PM    Final    DG HIP UNILAT WITH PELVIS 2-3 VIEWS LEFT  Result Date: 06/22/2019 CLINICAL DATA:  Pain in back, left hip EXAM: DG HIP (WITH OR WITHOUT PELVIS) 2-3V LEFT COMPARISON:  None. FINDINGS: Osteopenia. There is no evidence of hip fracture or dislocation. There is no evidence of arthropathy or other focal bone abnormality. IMPRESSION: Osteopenia. No no displaced fracture or dislocation of the left hip. Joint spaces are preserved. Electronically Signed   By: Lauralyn Primes M.D.   On: 06/22/2019 12:43   XR Lumbar Spine 2-3 Views  Result Date: 06/01/2019 His lumbar spine obtained in several projections. There is a degenerative scoliosis of about 10 degrees to the right. There are degenerative changes throughout the lumbar spine and narrowing of the L5-S1 disc space. May be slight anterior listhesis of L5 on S1. Diffuse calcification of the abdominal aorta but without obvious aneurysmal dilatation. No obvious compression fracture   Microbiology: Recent Results (from the past 240 hour(s))  Urine Culture     Status: Abnormal   Collection Time: 06/19/19 11:30 PM   Specimen: Urine, Random  Result Value Ref Range Status   Specimen Description   Final    URINE, RANDOM Performed at [facility], [address], [place], [place] [address]    Special Requests   Final    NONE Performed at [facility], [address], [place], [place] [address]    Culture >=100,000 COLONIES/mL KLEBSIELLA PNEUMONIAE (A)  Final    Report Status 06/22/2019 FINAL  Final   Organism ID, Bacteria KLEBSIELLA PNEUMONIAE (A)  Final      Susceptibility   Klebsiella pneumoniae - MIC*    AMPICILLIN >=32 RESISTANT Resistant     CEFAZOLIN <=4 SENSITIVE Sensitive     CEFTRIAXONE <=0.25 SENSITIVE Sensitive     CIPROFLOXACIN <=0.25 SENSITIVE Sensitive     GENTAMICIN <=1 SENSITIVE Sensitive     IMIPENEM <=0.25 SENSITIVE Sensitive     NITROFURANTOIN 128 RESISTANT Resistant     TRIMETH/SULFA <=20 SENSITIVE Sensitive     AMPICILLIN/SULBACTAM 8 SENSITIVE Sensitive     PIP/TAZO <=  4 SENSITIVE Sensitive     * >=100,000 COLONIES/mL KLEBSIELLA PNEUMONIAE  SARS CORONAVIRUS 2 (TAT 6-24 HRS) Nasopharyngeal Nasopharyngeal Swab     Status: None   Collection Time: 06/19/19 11:47 PM   Specimen: Nasopharyngeal Swab  Result Value Ref Range Status   SARS Coronavirus 2 NEGATIVE NEGATIVE Final    Comment: (NOTE) SARS-CoV-2 target nucleic acids are NOT DETECTED. The SARS-CoV-2 RNA is generally detectable in upper and lower respiratory specimens during the acute phase of infection. Negative results do not preclude SARS-CoV-2 infection, do not rule out co-infections with other pathogens, and should not be used as the sole basis for treatment or other patient management decisions. Negative results must be combined with clinical observations, patient history, and epidemiological information. The expected result is Negative. Fact Sheet for Patients: HairSlick.no Fact Sheet for Healthcare Providers: quierodirigir.com This test is not yet approved or cleared by the [place] FDA and  has been authorized for detection and/or diagnosis of SARS-CoV-2 by FDA under an Emergency Use Authorization (EUA). This EUA will remain  in effect (meaning this test can be used) for the duration of the COVID-19 declaration under Section 56 4(b)(1) of the Act, 21 U.S.C. section 360bbb-3(b)(1), unless the  authorization is terminated or revoked sooner. Performed at [facility], [address] [place], [place] [address]   MRSA PCR Screening     Status: None   Collection Time: 06/20/19  1:30 PM   Specimen: Nasal Mucosa; Nasopharyngeal  Result Value Ref Range Status   MRSA by PCR NEGATIVE NEGATIVE Final    Comment:        The GeneXpert MRSA Assay (FDA approved for NASAL specimens only), is one component of a comprehensive MRSA colonization surveillance program. It is not intended to diagnose MRSA infection nor to guide or monitor treatment for MRSA infections. Performed at [facility], [address] [place], [place] [address]      Labs: CBC: Recent Labs  Lab 06/19/19 2009 06/20/19 0125 06/20/19 0442 06/20/19 0442 06/20/19 0850 06/21/19 0246 06/22/19 0241 06/23/19 0343 06/24/19 0222  WBC 11.2*   < > 12.6*   < > 9.5 8.0 7.5 7.6 8.3  NEUTROABS 9.1*  --  10.1*  --  7.7  --   --   --   --   HGB 15.3*   < > 13.8   < > 12.8 11.2* 11.2* 11.8* 11.6*  HCT 46.9*   < > 40.2   < > 36.6 32.9* 34.3* 35.2* 34.6*  MCV 102.6*   < > 98.0   < > 94.8 96.5 100.0 100.6* 100.0  PLT 269   < > 249   < > 215 162 148* 143* 146*   < > = values in this interval not displayed.   Basic Metabolic Panel: Recent Labs  Lab 06/20/19 0442 06/20/19 0442 06/20/19 0850 06/20/19 0850 06/20/19 1321 06/20/19 1321 06/20/19 1602 06/20/19 1602 06/20/19 2033 06/20/19 2033 06/21/19 0002 06/21/19 0246 06/22/19 0241 06/23/19 0343 06/24/19 0222  NA 135   < > 135   < > 135   < > 136   < > 137   < > 140 140 144 138 138  K 5.4*   < > 4.5   < > 4.4   < > 4.5   < > 4.2   < > 3.8 3.5 4.0 3.4* 3.8  CL 101   < > 99   < > 96*   < > 98   < >  101   < > 100 100 102 100 102  CO2 17*   < > 22   < > 26   < > 28   < > 28   < > 28 33* 34* 31 28  GLUCOSE 174*   < > 199*   < > 213*   < > 190*   < > 135*   < > 121* 112* 88 91 87  BUN 160*   < > 80*   < > 83*   < > 85*   < > 78*   < > 71* 65* 31* 13 9   CREATININE 5.40*   < > 3.27*   < > 3.57*   < > 3.36*   < > 2.66*   < > 2.27* 1.95* 1.17* 0.87 0.69  CALCIUM 12.2*   < > 10.0   < > 9.2   < > 9.0   < > 8.4*   < > 8.5* 8.4* 8.9 8.4* 8.5*  MG 2.6*  --  2.0  --   --   --   --   --   --   --   --   --   --   --   --   PHOS  --    < > 4.4  --  4.4  --  4.1  --  3.8  --  3.3  --   --   --   --    < > = values in this interval not displayed.   Liver Function Tests: Recent Labs  Lab 06/20/19 0442 06/20/19 0442 06/20/19 0850 06/20/19 1321 06/20/19 2033 06/21/19 0002 06/21/19 0246 06/22/19 0241 06/23/19 0343  AST 22  --  21  --   --   --  17 19 19   ALT 21  --  20  --   --   --  18 17 17   ALKPHOS 44  --  43  --   --   --  35* 39 25*  BILITOT 0.8  --  1.2  --   --   --  0.9 0.5 0.5  PROT 6.6  --  6.1*  --   --   --  5.3* 5.2* 5.1*  ALBUMIN 3.6   < > 3.3*   < > 2.7* 2.8* 2.8* 2.7* 2.6*   < > = values in this interval not displayed.   Recent Labs  Lab 06/19/19 2009  LIPASE 105*   No results for input(s): AMMONIA in the last 168 hours. Cardiac Enzymes: Recent Labs  Lab 06/19/19 2130 06/20/19 0442  CKTOTAL 359* 309*  CKMB  --  9.8*   BNP (last 3 results) Recent Labs    06/20/19 0528 06/21/19 0246  BNP 144.3* 330.9*   CBG: Recent Labs  Lab 06/20/19 0039 06/20/19 0308 06/20/19 1704  GLUCAP 94 114* 146*    Time spent: 35 minutes  Signed:  Berle Mull  [facility]  06/24/2019 1:30 PM

## 2019-07-07 ENCOUNTER — Other Ambulatory Visit: Payer: Self-pay | Admitting: Radiology

## 2019-07-07 ENCOUNTER — Encounter: Payer: Medicare Other | Admitting: Physical Medicine and Rehabilitation

## 2019-07-08 ENCOUNTER — Other Ambulatory Visit: Payer: Self-pay

## 2019-07-08 ENCOUNTER — Ambulatory Visit (HOSPITAL_COMMUNITY)
Admission: RE | Admit: 2019-07-08 | Discharge: 2019-07-08 | Disposition: A | Discharge status: Home / Self Care | Payer: Medicare Other | Source: Ambulatory Visit | Attending: Internal Medicine | Admitting: Internal Medicine

## 2019-07-08 ENCOUNTER — Telehealth (HOSPITAL_COMMUNITY): Payer: Self-pay | Admitting: Radiology

## 2019-07-08 ENCOUNTER — Other Ambulatory Visit: Payer: Self-pay | Admitting: Radiology

## 2019-07-08 DIAGNOSIS — Z7901 Long term (current) use of anticoagulants: Secondary | ICD-10-CM | POA: Diagnosis not present

## 2019-07-08 DIAGNOSIS — Z79899 Other long term (current) drug therapy: Secondary | ICD-10-CM | POA: Diagnosis not present

## 2019-07-08 DIAGNOSIS — Z7982 Long term (current) use of aspirin: Secondary | ICD-10-CM | POA: Insufficient documentation

## 2019-07-08 DIAGNOSIS — X58XXXA Exposure to other specified factors, initial encounter: Secondary | ICD-10-CM | POA: Diagnosis not present

## 2019-07-08 DIAGNOSIS — Z538 Procedure and treatment not carried out for other reasons: Secondary | ICD-10-CM | POA: Insufficient documentation

## 2019-07-08 DIAGNOSIS — S32040A Wedge compression fracture of fourth lumbar vertebra, initial encounter for closed fracture: Secondary | ICD-10-CM

## 2019-07-08 LAB — CBC
HCT: 37 % (ref 36.0–46.0)
Hemoglobin: 11.8 g/dL — ABNORMAL LOW (ref 12.0–15.0)
MCH: 32.9 pg (ref 26.0–34.0)
MCHC: 31.9 g/dL (ref 30.0–36.0)
MCV: 103.1 fL — ABNORMAL HIGH (ref 80.0–100.0)
Platelets: 192 10*3/uL (ref 150–400)
RBC: 3.59 MIL/uL — ABNORMAL LOW (ref 3.87–5.11)
RDW: 14.5 % (ref 11.5–15.5)
WBC: 5.9 10*3/uL (ref 4.0–10.5)
nRBC: 0 % (ref 0.0–0.2)

## 2019-07-08 LAB — URINALYSIS, COMPLETE (UACMP) WITH MICROSCOPIC
Bilirubin Urine: NEGATIVE
Glucose, UA: NEGATIVE mg/dL
Hgb urine dipstick: NEGATIVE
Ketones, ur: NEGATIVE mg/dL
Nitrite: NEGATIVE
Protein, ur: NEGATIVE mg/dL
Specific Gravity, Urine: 1.016 (ref 1.005–1.030)
pH: 6 (ref 5.0–8.0)

## 2019-07-08 LAB — BASIC METABOLIC PANEL
Anion gap: 6 (ref 5–15)
BUN: 14 mg/dL (ref 8–23)
CO2: 26 mmol/L (ref 22–32)
Calcium: 8.9 mg/dL (ref 8.9–10.3)
Chloride: 106 mmol/L (ref 98–111)
Creatinine, Ser: 0.65 mg/dL (ref 0.44–1.00)
GFR calc Af Amer: >60 mL/min (ref >60)
GFR calc non Af Amer: >60 mL/min (ref >60)
Glucose, Bld: 91 mg/dL (ref 70–99)
Potassium: 4.2 mmol/L (ref 3.5–5.1)
Sodium: 138 mmol/L (ref 135–145)

## 2019-07-08 LAB — PROTIME-INR
INR: 1 (ref 0.8–1.2)
Prothrombin Time: 13 seconds (ref 11.4–15.2)

## 2019-07-08 MED ORDER — SODIUM CHLORIDE 0.9 % IV SOLN: INTRAVENOUS | Status: DC

## 2019-07-08 MED ORDER — CEFAZOLIN SODIUM-DEXTROSE 2-4 GM/100ML-% IV SOLN: 2.0000 g | INTRAVENOUS | Status: DC

## 2019-07-08 NOTE — Discharge Instructions (Signed)
Moderate Conscious Sedation, Adult, Care After These instructions provide you with information about caring for yourself after your procedure. Your health care provider may also give you more specific instructions. Your treatment has been planned according to current medical practices, but problems sometimes occur. Call your health care provider if you have any problems or questions after your procedure. What can I expect after the procedure? After your procedure, it is common:  To feel sleepy for several hours.  To feel clumsy and have poor balance for several hours.  To have poor judgment for several hours.  To vomit if you eat too soon. Follow these instructions at home: For at least 24 hours after the procedure:   Do not: ? Participate in activities where you could fall or become injured. ? Drive. ? Use heavy machinery. ? Drink alcohol. ? Take sleeping pills or medicines that cause drowsiness. ? Make important decisions or sign legal documents. ? Take care of children on your own.  Rest. Eating and drinking  Follow the diet recommended by your health care provider.  If you vomit: ? Drink water, juice, or soup when you can drink without vomiting. ? Make sure you have little or no nausea before eating solid foods. General instructions  Have a responsible adult stay with you until you are awake and alert.  Take over-the-counter and prescription medicines only as told by your health care provider.  If you smoke, do not smoke without supervision.  Keep all follow-up visits as told by your health care provider. This is important. Contact a health care provider if:  You keep feeling nauseous or you keep vomiting.  You feel light-headed.  You develop a rash.  You have a fever. Get help right away if:  You have trouble breathing. This information is not intended to replace advice given to you by your health care provider. Make sure you discuss any questions you have  with your health care provider. Document Revised: 01/30/2017 Document Reviewed: 06/09/2015 Elsevier Patient Education  2020 Elsevier Inc.  

## 2019-07-08 NOTE — Progress Notes (Signed)
NIR.  Patient was scheduled for an image-guided L4 KP/VP today with Dr. Corliss Skains.  Unfortunately, there were three emergent procedures that require Dr. Fatima Sanger immediate attention at this time. Because of this, patient's procedure is cancelled for today and rescheduled for Monday 07/11/2019. Our schedulers to call patient/patient's daughter to go over pre-procedure instructions/time for procedure. No need to repeat labs for Monday per Dr. Corliss Skains. Discussed above with both patient and her daughter, [name] (via telephone). All questions answered and concerns addressed. Patient conveys understanding and agrees with plan.  Please call NIR with questions/concerns.   Waylan Boga Nadezhda Pollitt, PA-C 07/08/2019, 12:12 PM

## 2019-07-08 NOTE — H&P (Addendum)
HPI:  The patient has had a H&P performed within the last 30 days, all history, medications, and exam have been reviewed. The patient denies any interval changes since the H&P. Please see my prior consult note from 06/22/2019 for further information.  Patient presents today for possible image-guided L4 KP/VP. Patient awake and alert laying in bed. Complains of back pain, rated 6/10 at this time. Denies fever, chills, chest pain, dyspnea, abdominal pain, or headache.  LD Eliquis Monday 07/04/2019.   Medications: Prior to Admission medications   Medication Sig Start Date End Date Taking? Authorizing Provider  acetaminophen (TYLENOL) 650 MG CR tablet Take 1,300 mg by mouth as needed for pain.   Yes [provider]  ALPRAZolam (XANAX) 0.25 MG tablet Take 0.5-1 tablets (0.125-0.25 mg total) by mouth 2 (two) times daily as needed for anxiety. 06/24/19  Yes Rolly Salter, MD  apixaban (ELIQUIS) 5 MG TABS tablet TAKE 1 TABLET(5 MG) BY MOUTH TWICE DAILY Patient taking differently: Take 5 mg by mouth 2 (two) times daily.  06/13/19  Yes Nahser, Deloris Ping, MD  aspirin EC 81 MG EC tablet Take 1 tablet (81 mg total) by mouth daily. 06/25/19  Yes Rolly Salter, MD  atorvastatin (LIPITOR) 40 MG tablet TAKE 1 TABLET(40 MG) BY MOUTH DAILY AT 6 PM Patient taking differently: Take 40 mg by mouth daily at 6 PM.  04/26/19  Yes Nahser, Deloris Ping, MD  calcium citrate (CALCITRATE - DOSED IN MG ELEMENTAL CALCIUM) 950 MG tablet Take 200 mg of elemental calcium by mouth daily.   Yes [provider]  famotidine (PEPCID AC) 10 MG tablet Take 10 mg by mouth as needed for heartburn or indigestion.   Yes [provider]  furosemide (LASIX) 20 MG tablet Take 2 tablets (40 mg total) by mouth daily as needed for up to 365 doses for fluid or edema (weight gain of 3lbs in 1 days or 5 lbs in 2 days). Patient taking differently: Take 10 mg by mouth every other day.  06/24/19  Yes Rolly Salter, MD    latanoprost (XALATAN) 0.005 % ophthalmic solution Place 1 drop into both eyes at bedtime.  04/17/11  Yes [provider]  lidocaine (LIDODERM) 5 % Place 1 patch onto the skin daily. Remove & Discard patch within 12 hours or as directed by MD Patient taking differently: Place 1 patch onto the skin every 8 (eight) hours.  06/25/19  Yes Rolly Salter, MD  loratadine (CLARITIN) 10 MG tablet Take 10 mg by mouth daily.   Yes [provider]  Multiple Vitamin (MULTIVITAMIN WITH MINERALS) TABS tablet Take 1 tablet by mouth daily.   Yes [provider]  nitroGLYCERIN (NITROSTAT) 0.4 MG SL tablet Place 1 tablet (0.4 mg total) under the tongue every 5 (five) minutes as needed for chest pain. 02/24/18 07/05/19 Yes Simmons, Brittainy M, PA-C  pantoprazole (PROTONIX) 40 MG tablet TAKE 1 TABLET(40 MG) BY MOUTH AT BEDTIME Patient taking differently: Take 40 mg by mouth at bedtime.  03/28/19  Yes Robbie Lis M, PA-C  propranolol (INDERAL) 10 MG tablet Take 10 mg by mouth 2 (two) times daily.    Yes [provider]  simethicone (MYLICON) 80 MG chewable tablet Chew 160 mg by mouth every 6 (six) hours as needed for flatulence.   Yes [provider]  Vitamin D, Ergocalciferol, (DRISDOL) 1.25 MG (50000 UT) CAPS capsule Take 50,000 Units by mouth every Wednesday.    Yes [provider]  docusate sodium (COLACE) 100 MG capsule Take 1 capsule (100 mg total) by mouth 2 (two) times daily. Patient not taking: Reported on 07/05/2019 06/24/19   Lavina Hamman, MD  feeding supplement, ENSURE ENLIVE, (ENSURE ENLIVE) LIQD Take 237 mLs by mouth 2 (two) times daily between meals. Patient not taking: Reported on 07/05/2019 06/24/19   Lavina Hamman, MD  traMADol (ULTRAM) 50 MG tablet Take 1 tablet (50 mg total) by mouth every 6 (six) hours as needed for severe pain. Patient not taking: Reported on 07/05/2019 06/24/19   Lavina Hamman, MD     Vital Signs: BP (!) 147/66   Pulse  74   Temp 98.1 F (36.7 C) (Oral)   Resp 16   Ht 5\' 2"  (1.575 m)   Wt 139 lb (63 kg)   SpO2 97%   BMI 25.42 kg/m   Physical Exam Vitals and nursing note reviewed.  Constitutional:      General: She is not in acute distress.    Appearance: Normal appearance.  Cardiovascular:     Rate and Rhythm: Normal rate and regular rhythm.     Heart sounds: Normal heart sounds. No murmur.  Pulmonary:     Effort: Pulmonary effort is normal. No respiratory distress.     Breath sounds: Normal breath sounds. No wheezing.  Musculoskeletal:     Comments: Moderate tenderness of midline lower spine.  Skin:    General: Skin is warm and dry.  Neurological:     Mental Status: She is alert and oriented to person, place, and time.     Mallampati Score:  MD Evaluation Airway: WNL Heart: WNL Abdomen: WNL Chest/ Lungs: WNL ASA  Classification: 3 Mallampati/Airway Score: Two  Labs:  CBC: Recent Labs    06/22/19 0241 06/23/19 0343 06/24/19 0222 07/08/19 0802  WBC 7.5 7.6 8.3 5.9  HGB 11.2* 11.8* 11.6* 11.8*  HCT 34.3* 35.2* 34.6* 37.0  PLT 148* 143* 146* 192    COAGS: Recent Labs    06/23/19 0343 07/08/19 0802  INR 1.1 1.0    BMP: Recent Labs    06/22/19 0241 06/23/19 0343 06/24/19 0222 07/08/19 0802  NA 144 138 138 138  K 4.0 3.4* 3.8 4.2  CL 102 100 102 106  CO2 34* 31 28 26   GLUCOSE 88 91 87 91  BUN 31* 13 9 14   CALCIUM 8.9 8.4* 8.5* 8.9  CREATININE 1.17* 0.87 0.69 0.65  GFRNONAA 43* >60 >60 >60  GFRAA 50* >60 >60 >60    LIVER FUNCTION TESTS: Recent Labs    06/20/19 0850 06/20/19 1321 06/21/19 0002 06/21/19 0246 06/22/19 0241 06/23/19 0343  BILITOT 1.2  --   --  0.9 0.5 0.5  AST 21  --   --  17 19 19   ALT 20  --   --  18 17 17   ALKPHOS 43  --   --  35* 39 25*  PROT 6.1*  --   --  5.3* 5.2* 5.1*  ALBUMIN 3.3*   < > 2.8* 2.8* 2.7* 2.6*   < > = values in this interval not displayed.    Assessment/Plan:   L4 compression fracture. Plan for  image-guided L4 KP/VP today with Dr. Estanislado Pandy. Patient is NPO. Afebrile and WBCs WNL. Eliquis held per IR protocol. INR 1.0 today. UA reviewed by Dr. Estanislado Pandy- improvement in bacteria with IV antibiotic use. Patient denies urinary symptoms at this time. Per Dr. Estanislado Pandy ok to proceed today.  Risks and benefits of L4  kyphoplasty/vertebroplasty were discussed with the patient including, but not limited to education regarding the natural healing process of compression fractures without intervention, bleeding, infection, cement migration which may cause spinal cord damage, paralysis, pulmonary embolism or even death. This interventional procedure involves the use of X-rays and because of the nature of the planned procedure, it is possible that we will have prolonged use of X-ray fluoroscopy. Potential radiation risks to you include (but are not limited to) the following: - A slightly elevated risk for cancer  several years later in life. This risk is typically less than 0.5% percent. This risk is low in comparison to the normal incidence of human cancer, which is 33% for women and 50% for men according to the American Cancer Society. - Radiation induced injury can include skin redness, resembling a rash, tissue breakdown / ulcers and hair loss (which can be temporary or permanent).  The likelihood of either of these occurring depends on the difficulty of the procedure and whether you are sensitive to radiation due to previous procedures, disease, or genetic conditions.  IF your procedure requires a prolonged use of radiation, you will be notified and given written instructions for further action.  It is your responsibility to monitor the irradiated area for the 2 weeks following the procedure and to notify your physician if you are concerned that you have suffered a radiation induced injury.   All of the patient's questions were answered, patient is agreeable to proceed. Consent signed and in  chart.   Signed: Elwin Mocha 07/08/2019, 9:49 AM

## 2019-07-08 NOTE — Telephone Encounter (Signed)
Called pt's daughter, [name]. Rescheduled patient's back procedure for Monday, Jul 11, 2019 at 10 am. Left detailed instructions on VM. JM

## 2019-07-11 ENCOUNTER — Other Ambulatory Visit: Payer: Self-pay

## 2019-07-11 ENCOUNTER — Ambulatory Visit (HOSPITAL_COMMUNITY)
Admission: RE | Admit: 2019-07-11 | Discharge: 2019-07-11 | Disposition: A | Discharge status: Home / Self Care | Payer: Medicare Other | Source: Ambulatory Visit | Attending: Internal Medicine | Admitting: Internal Medicine

## 2019-07-11 ENCOUNTER — Other Ambulatory Visit: Payer: Self-pay | Admitting: Internal Medicine

## 2019-07-11 DIAGNOSIS — X58XXXA Exposure to other specified factors, initial encounter: Secondary | ICD-10-CM | POA: Diagnosis not present

## 2019-07-11 DIAGNOSIS — Z79899 Other long term (current) drug therapy: Secondary | ICD-10-CM | POA: Diagnosis not present

## 2019-07-11 DIAGNOSIS — S32040A Wedge compression fracture of fourth lumbar vertebra, initial encounter for closed fracture: Secondary | ICD-10-CM | POA: Diagnosis not present

## 2019-07-11 DIAGNOSIS — Z7901 Long term (current) use of anticoagulants: Secondary | ICD-10-CM | POA: Insufficient documentation

## 2019-07-11 DIAGNOSIS — Z7982 Long term (current) use of aspirin: Secondary | ICD-10-CM | POA: Insufficient documentation

## 2019-07-11 HISTORY — PX: IR KYPHO LUMBAR INC FX REDUCE BONE BX UNI/BIL CANNULATION INC/IMAGING: IMG5519

## 2019-07-11 MED ORDER — BUPIVACAINE HCL (PF) 0.5 % IJ SOLN
INTRAMUSCULAR | Status: AC | PRN
  Administered 2019-07-11: 15 mL

## 2019-07-11 MED ORDER — MIDAZOLAM HCL 2 MG/2ML IJ SOLN
INTRAMUSCULAR | Status: AC | PRN
  Administered 2019-07-11 (×2): 1 mg via INTRAVENOUS

## 2019-07-11 MED ORDER — HYDRALAZINE HCL 20 MG/ML IJ SOLN
INTRAMUSCULAR | Status: AC
  Filled 2019-07-11: qty 1

## 2019-07-11 MED ORDER — IOHEXOL 300 MG/ML  SOLN
50.0000 mL | Freq: Once | INTRAMUSCULAR | Status: AC | PRN
  Administered 2019-07-11: 5 mL

## 2019-07-11 MED ORDER — TOBRAMYCIN SULFATE 1.2 G IJ SOLR
INTRAMUSCULAR | Status: AC
  Filled 2019-07-11: qty 1.2

## 2019-07-11 MED ORDER — FENTANYL CITRATE (PF) 100 MCG/2ML IJ SOLN
INTRAMUSCULAR | Status: AC | PRN
  Administered 2019-07-11 (×2): 25 ug via INTRAVENOUS

## 2019-07-11 MED ORDER — BUPIVACAINE HCL (PF) 0.5 % IJ SOLN
INTRAMUSCULAR | Status: AC
  Filled 2019-07-11: qty 30

## 2019-07-11 MED ORDER — SODIUM CHLORIDE 0.9 % IV SOLN: INTRAVENOUS | Status: DC

## 2019-07-11 MED ORDER — MIDAZOLAM HCL 2 MG/2ML IJ SOLN
INTRAMUSCULAR | Status: AC
  Filled 2019-07-11: qty 2

## 2019-07-11 MED ORDER — SODIUM CHLORIDE 0.9 % IV SOLN: INTRAVENOUS | Status: AC

## 2019-07-11 MED ORDER — HYDROMORPHONE HCL 1 MG/ML IJ SOLN
INTRAMUSCULAR | Status: AC | PRN
  Administered 2019-07-11: 1 mg via INTRAVENOUS

## 2019-07-11 MED ORDER — GELATIN ABSORBABLE 12-7 MM EX MISC
CUTANEOUS | Status: AC
  Filled 2019-07-11: qty 1

## 2019-07-11 MED ORDER — HYDROMORPHONE HCL 1 MG/ML IJ SOLN
INTRAMUSCULAR | Status: AC
  Filled 2019-07-11: qty 1

## 2019-07-11 MED ORDER — CEFAZOLIN SODIUM-DEXTROSE 2-4 GM/100ML-% IV SOLN
INTRAVENOUS | Status: AC
  Filled 2019-07-11: qty 100

## 2019-07-11 MED ORDER — FENTANYL CITRATE (PF) 100 MCG/2ML IJ SOLN
INTRAMUSCULAR | Status: AC
  Filled 2019-07-11: qty 2

## 2019-07-11 MED ORDER — CEFAZOLIN SODIUM-DEXTROSE 2-4 GM/100ML-% IV SOLN
2.0000 g | Freq: Once | INTRAVENOUS | Status: AC
  Administered 2019-07-11: 2 g via INTRAVENOUS

## 2019-07-11 MED ORDER — HYDRALAZINE HCL 20 MG/ML IJ SOLN
INTRAMUSCULAR | Status: AC | PRN
  Administered 2019-07-11: 5 mg via INTRAVENOUS

## 2019-07-11 NOTE — Progress Notes (Signed)
Spoke with Gerarda Gunther, PA to see when pt should restart Eliquis. States to restart tomorrow. Pt informed, and marked on AVS. Voices understanding.

## 2019-07-11 NOTE — H&P (Signed)
HPI:  The patient has had a H&P performed within the last 30 days, all history, medications, and exam have been reviewed. The patient denies any interval changes since the H&P. Please see my prior consult note from 06/22/2019 and 07/08/2019 for further information.  Patient presents today for possible image-guided L4 KP/VP. Patient awake and alert laying in bed. Complains of back pain, rated 6/10 at this time. Denies fever, chills, chest pain, dyspnea, abdominal pain, or headache.  LD Eliquis Friday 07/08/2019.   Medications: Prior to Admission medications   Medication Sig Start Date End Date Taking? Authorizing Provider  acetaminophen (TYLENOL) 650 MG CR tablet Take 1,300 mg by mouth as needed for pain.   Yes [provider]  ALPRAZolam (XANAX) 0.25 MG tablet Take 0.5-1 tablets (0.125-0.25 mg total) by mouth 2 (two) times daily as needed for anxiety. 06/24/19  Yes Rolly Salter, MD  aspirin EC 81 MG EC tablet Take 1 tablet (81 mg total) by mouth daily. 06/25/19  Yes Rolly Salter, MD  atorvastatin (LIPITOR) 40 MG tablet TAKE 1 TABLET(40 MG) BY MOUTH DAILY AT 6 PM Patient taking differently: Take 40 mg by mouth daily at 6 PM.  04/26/19  Yes Nahser, Deloris Ping, MD  calcium citrate (CALCITRATE - DOSED IN MG ELEMENTAL CALCIUM) 950 MG tablet Take 200 mg of elemental calcium by mouth daily.   Yes [provider]  docusate sodium (COLACE) 100 MG capsule Take 1 capsule (100 mg total) by mouth 2 (two) times daily. 06/24/19  Yes Rolly Salter, MD  feeding supplement, ENSURE ENLIVE, (ENSURE ENLIVE) LIQD Take 237 mLs by mouth 2 (two) times daily between meals. 06/24/19  Yes Rolly Salter, MD  furosemide (LASIX) 20 MG tablet Take 2 tablets (40 mg total) by mouth daily as needed for up to 365 doses for fluid or edema (weight gain of 3lbs in 1 days or 5 lbs in 2 days). Patient taking differently: Take 10 mg by mouth every other day.  06/24/19  Yes Rolly Salter, MD  latanoprost (XALATAN)  0.005 % ophthalmic solution Place 1 drop into both eyes at bedtime.  04/17/11  Yes [provider]  lidocaine (LIDODERM) 5 % Place 1 patch onto the skin daily. Remove & Discard patch within 12 hours or as directed by MD Patient taking differently: Place 1 patch onto the skin every 8 (eight) hours.  06/25/19  Yes Rolly Salter, MD  loratadine (CLARITIN) 10 MG tablet Take 10 mg by mouth daily.   Yes [provider]  Multiple Vitamin (MULTIVITAMIN WITH MINERALS) TABS tablet Take 1 tablet by mouth daily.   Yes [provider]  pantoprazole (PROTONIX) 40 MG tablet TAKE 1 TABLET(40 MG) BY MOUTH AT BEDTIME Patient taking differently: Take 40 mg by mouth at bedtime.  03/28/19  Yes Robbie Lis M, PA-C  propranolol (INDERAL) 10 MG tablet Take 10 mg by mouth 2 (two) times daily.    Yes [provider]  Vitamin D, Ergocalciferol, (DRISDOL) 1.25 MG (50000 UT) CAPS capsule Take 50,000 Units by mouth every Wednesday.    Yes [provider]  apixaban (ELIQUIS) 5 MG TABS tablet TAKE 1 TABLET(5 MG) BY MOUTH TWICE DAILY Patient taking differently: Take 5 mg by mouth 2 (two) times daily.  06/13/19   Nahser, Deloris Ping, MD  famotidine (PEPCID AC) 10 MG tablet Take 10 mg by mouth as needed for heartburn or indigestion.    [provider]  nitroGLYCERIN (NITROSTAT) 0.4 MG SL  tablet Place 1 tablet (0.4 mg total) under the tongue every 5 (five) minutes as needed for chest pain. 02/24/18 07/05/19  Lyda Jester M, PA-C  simethicone (MYLICON) 80 MG chewable tablet Chew 160 mg by mouth every 6 (six) hours as needed for flatulence.    [provider]  traMADol (ULTRAM) 50 MG tablet Take 1 tablet (50 mg total) by mouth every 6 (six) hours as needed for severe pain. Patient not taking: Reported on 07/05/2019 06/24/19   Lavina Hamman, MD     Vital Signs: BP (!) 148/88   Pulse 70   Resp 14   Ht 5\' 2"  (1.575 m)   Wt 138 lb 14.2 oz (63 kg)   SpO2 97%   BMI  25.40 kg/m   Physical Exam Vitals and nursing note reviewed.  Constitutional:      General: She is not in acute distress.    Appearance: Normal appearance.  Cardiovascular:     Rate and Rhythm: Normal rate and regular rhythm.     Heart sounds: Normal heart sounds. No murmur.  Pulmonary:     Effort: Pulmonary effort is normal. No respiratory distress.     Breath sounds: Normal breath sounds. No wheezing.  Musculoskeletal:     Comments: Moderate tenderness of midline lower spine.  Skin:    General: Skin is warm and dry.  Neurological:     Mental Status: She is alert and oriented to person, place, and time.     Mallampati Score:  MD Evaluation Airway: WNL Heart: WNL Abdomen: WNL Chest/ Lungs: WNL ASA  Classification: 3 Mallampati/Airway Score: Two  Labs:  CBC: Recent Labs    06/22/19 0241 06/23/19 0343 06/24/19 0222 07/08/19 0802  WBC 7.5 7.6 8.3 5.9  HGB 11.2* 11.8* 11.6* 11.8*  HCT 34.3* 35.2* 34.6* 37.0  PLT 148* 143* 146* 192    COAGS: Recent Labs    06/23/19 0343 07/08/19 0802  INR 1.1 1.0    BMP: Recent Labs    06/22/19 0241 06/23/19 0343 06/24/19 0222 07/08/19 0802  NA 144 138 138 138  K 4.0 3.4* 3.8 4.2  CL 102 100 102 106  CO2 34* 31 28 26   GLUCOSE 88 91 87 91  BUN 31* 13 9 14   CALCIUM 8.9 8.4* 8.5* 8.9  CREATININE 1.17* 0.87 0.69 0.65  GFRNONAA 43* >60 >60 >60  GFRAA 50* >60 >60 >60    LIVER FUNCTION TESTS: Recent Labs    06/20/19 0850 06/20/19 1321 06/21/19 0002 06/21/19 0246 06/22/19 0241 06/23/19 0343  BILITOT 1.2  --   --  0.9 0.5 0.5  AST 21  --   --  17 19 19   ALT 20  --   --  18 17 17   ALKPHOS 43  --   --  35* 39 25*  PROT 6.1*  --   --  5.3* 5.2* 5.1*  ALBUMIN 3.3*   < > 2.8* 2.8* 2.7* 2.6*   < > = values in this interval not displayed.    Assessment/Plan:   L4 compression fracture. Plan for image-guided L4 KP/VP today with Dr. Estanislado Pandy. Patient is NPO. Afebrile and WBCs WNL. Eliquis held per IR  protocol. INR 1.0 on 07/08/2019. UA 07/08/2019 reviewed by Dr. Estanislado Pandy- improvement in bacteria with IV antibiotic use. Patient denies urinary symptoms at this time. Per Dr. Estanislado Pandy ok to proceed today.  Risks and benefits of L4 kyphoplasty/vertebroplasty were discussed with the patient including, but not limited to education regarding the natural  healing process of compression fractures without intervention, bleeding, infection, cement migration which may cause spinal cord damage, paralysis, pulmonary embolism or even death. This interventional procedure involves the use of X-rays and because of the nature of the planned procedure, it is possible that we will have prolonged use of X-ray fluoroscopy. Potential radiation risks to you include (but are not limited to) the following: - A slightly elevated risk for cancer      several years later in life. This risk is typically less than 0.5% percent. This risk is low in comparison to the normal incidence of human cancer, which is 33% for women and 50% for men according to the American Cancer Society. - Radiation induced injury can include skin redness, resembling a rash, tissue breakdown / ulcers and hair loss (which can be temporary or permanent).  The likelihood of either of these occurring depends on the difficulty of the procedure and whether you are sensitive to radiation due to previous procedures, disease, or genetic conditions.  IF your procedure requires a prolonged use of radiation, you will be notified and given written instructions for further action.  It is your responsibility to monitor the irradiated area for the 2 weeks following the procedure and to notify your physician if you are concerned that you have suffered a radiation induced injury.   All of the patient's questions were answered, patient is agreeable to proceed. Consent signed and in chart.   Signed: Elwin Mocha 07/11/2019, 9:17 AM

## 2019-07-11 NOTE — Progress Notes (Signed)
Discharge instructions reviewed with pt and her daughter (via telephone) both voice understanding.  

## 2019-07-11 NOTE — Procedures (Signed)
S/P L4 balloon KP S.Milia Warth MD

## 2019-07-11 NOTE — Discharge Instructions (Signed)
1. No stooping,or bending or lifting more than 10 lbs for 2 weeks. 2.Use walker to ambulate for  2 weeks. 3.RTC PRN 2 weeks   Balloon Kyphoplasty, Care After This sheet gives you information about how to care for yourself after your procedure. Your health care provider may also give you more specific instructions. If you have problems or questions, contact your health care provider. What can I expect after the procedure? After your procedure, it is common to have back pain. Follow these instructions at home: Medicines  Take over-the-counter and prescription medicines only as told by your health care provider.  Ask your health care provider if the medicine prescribed to you: ? Requires you to avoid driving or using heavy machinery. ? Can cause constipation. You may need to take steps to prevent or treat constipation, such as:  Drink enough fluid to keep your urine pale yellow.  Take over-the-counter or prescription medicines.  Eat foods that are high in fiber, such as beans, whole grains, and fresh fruits and vegetables.  Limit foods that are high in fat and processed sugars, such as fried or sweet foods. Puncture site care   Follow instructions from your health care provider about how to take care of your puncture site. Make sure you: ? Wash your hands with soap and water before and after you change your bandage (dressing). If soap and water are not available, use hand sanitizer. ? Change your dressing as told by your health care provider. ? Leave skin glue or adhesive strips in place. These skin closures may need to be in place for 2 weeks or longer. If adhesive strip edges start to loosen and curl up, you may trim the loose edges. Do not remove adhesive strips completely unless your health care provider tells you to do that.  Check your puncture site every day for signs of infection. Watch for: ? Redness, swelling, or pain. ? Fluid or blood. ? Warmth. ? Pus or a bad  smell.  Keep your dressing dry until your health care provider says that it can be removed. Managing pain, stiffness, and swelling   If directed, put ice on the painful area. ? Put ice in a plastic bag. ? Place a towel between your skin and the bag. ? Leave the ice on for 20 minutes, 2-3 times a day. Activity  Rest your back and avoid intense physical activity for as long as told by your health care provider.  Avoid bending, lifting, or twisting your back for as long as told by your health care provider.  Return to your normal activities as told by your health care provider. Ask your health care provider what activities are safe for you.  Do not lift anything that is heavier than 5 lb (2.2 kg). You may need to avoid heavy lifting for several weeks. General instructions  Do not use any products that contain nicotine or tobacco, such as cigarettes, e-cigarettes, and chewing tobacco. These can delay bone healing. If you need help quitting, ask your health care provider.  Do not drive for 24 hours if you were given a sedative during your procedure.  Keep all follow-up visits as told by your health care provider. This is important. Contact a health care provider if:  You have a fever or chills.  You have redness, swelling, or pain at the site of your puncture.  You have fluid, blood, or pus coming from the puncture site.  You have pain that gets worse or does not  get better with medicine.  You develop numbness or weakness in any part of your body. Get help right away if:  You have chest pain.  You have difficulty breathing.  You have weakness, numbness, or tingling in your legs.  You cannot control your bladder or bowel movements.  You suddenly become weak or numb on one side of your body.  You become very confused.  You have trouble speaking or understanding, or both. Summary  Follow instructions from your health care provider about how to take care of your puncture  site.  Take over-the-counter and prescription medicines only as told by your health care provider.  Rest your back and avoid intense physical activity for as long as told by your health care provider.  Contact a health care provider if you have pain that gets worse or does not get better with medicine.  Keep all follow-up visits as told by your health care provider. This is important. This information is not intended to replace advice given to you by your health care provider. Make sure you discuss any questions you have with your health care provider. Document Revised: 01/25/2018 Document Reviewed: 01/25/2018 Elsevier Patient Education  2020 [employer].

## 2019-07-12 ENCOUNTER — Telehealth: Payer: Self-pay | Admitting: Cardiovascular Disease

## 2019-07-12 NOTE — Telephone Encounter (Signed)
   Pt's daughter Charlene Dickerson calling to request to come in with the pt to her appt on 07/15/19 she said pt just had a surgery and fragile and hard of hearing, she needs to be there to assist her. She also added both of them received complete covid vaccine  Please advise

## 2019-07-12 NOTE — Telephone Encounter (Signed)
Spoke with patient's daughter and advised that she may come in with her mother for the appointment as long as they do not have symptoms of Covid-19 or other acute communicable illness at the time of the appointment. She verbalized understanding and thanked me for the call.

## 2019-07-15 ENCOUNTER — Ambulatory Visit (INDEPENDENT_AMBULATORY_CARE_PROVIDER_SITE_OTHER): Payer: Medicare Other | Admitting: Cardiovascular Disease

## 2019-07-15 ENCOUNTER — Encounter: Payer: Self-pay | Admitting: Cardiovascular Disease

## 2019-07-15 ENCOUNTER — Other Ambulatory Visit: Payer: Self-pay

## 2019-07-15 VITALS — BP 144/76 | HR 88 | Ht 62.0 in | Wt 144.5 lb

## 2019-07-15 DIAGNOSIS — I480 Paroxysmal atrial fibrillation: Secondary | ICD-10-CM

## 2019-07-15 DIAGNOSIS — M79605 Pain in left leg: Secondary | ICD-10-CM

## 2019-07-15 DIAGNOSIS — M79604 Pain in right leg: Secondary | ICD-10-CM

## 2019-07-15 DIAGNOSIS — I472 Ventricular tachycardia, unspecified: Secondary | ICD-10-CM

## 2019-07-15 DIAGNOSIS — R0989 Other specified symptoms and signs involving the circulatory and respiratory systems: Secondary | ICD-10-CM | POA: Diagnosis not present

## 2019-07-15 DIAGNOSIS — I10 Essential (primary) hypertension: Secondary | ICD-10-CM

## 2019-07-15 HISTORY — DX: Ventricular tachycardia: I47.2

## 2019-07-15 HISTORY — DX: Ventricular tachycardia, unspecified: I47.20

## 2019-07-15 NOTE — Patient Instructions (Addendum)
Medication Instructions:  Your physician has recommended you make the following change in your medication:  STOP Aspirin  *If you need a refill on your cardiac medications before your next appointment, please call your pharmacy*   Lab Work: None Ordered If you have labs (blood work) drawn today and your tests are completely normal, you will receive your results only by: Marland Kitchen MyChart Message (if you have MyChart) OR . A paper copy in the mail If you have any lab test that is abnormal or we need to change your treatment, we will call you to review the results.   Testing/Procedures: Your physician has requested that you have a lower extremity arterial exercise duplex. During this test, exercise and ultrasound are used to evaluate arterial blood flow in the legs. Allow one hour for this exam. There are no restrictions or special instructions.    Follow-Up: At [facility], you and your health needs are our priority.  As part of our continuing mission to provide you with exceptional heart care, we have created designated Provider Care Teams.  These Care Teams include your primary Cardiologist (physician) and Advanced Practice Providers (APPs -  Physician Assistants and Nurse Practitioners) who all work together to provide you with the care you need, when you need it.   Your next appointment:   6 month(s)  The format for your next appointment:   Either In Person or Virtual  Provider:   You may see Kristeen Miss, MD or one of the following Advanced Practice Providers on your designated Care Team:    Tereso Newcomer, PA-C  [place], [place]

## 2019-07-15 NOTE — Progress Notes (Signed)
Cardiology Office Note:    Date:  07/15/2019   ID:  Charlene Dickerson, DOB 08/09/1932, MRN [id number]  PCP:  Martha Clan, MD  Cardiologist:  Kristeen Miss, MD  Electrophysiologist:  None   Referring MD: Martha Clan, MD   Chief Complaint  Patient presents with  . Hypertension  . Coronary Artery Disease     Jan. 10, 2020   Charlene Dickerson is a 86 y.o. female with a recent admission to the hospital with an acute inferior wall myocardial infarction.  Was complicated by right ventricular infarction.  Was seen with Charlene Dickerson, ( daughter)   She initially had lots of problems with hypotension and required Levophed for several days while in hospital.  But has made a great recovery.   Also had paroxysmal atrial fibrillation as a complication of her heart attack.  She converted back to sinus rhythm on December 24    She is now feeling quite a bit better. No syncope or presyncope.  She is not had any episodes of angina.  April 12, 2018: See seen back today for follow-up visit.  She has a history of an inferior wall myocardial infarction complicated by right ventricular myocardial infarction.  She is developed atrial fibrillation. Gaining weight and her blood pressures been elevated.  We started her on furosemide 20 mg a day . Marland Kitchen  Swelling has improved significantly after starting Lasix but she still very uncomfortable. In discussing with her daughter, Charlene Dickerson , it is clear that Mrs. Shiflet is not putting out much urine even on the Lasix.  Feb. 22, 2021  Charlene Dickerson was seen a year ago  She has a hx of Inf. MI with RV infarction.  made a nice recovery   She has RV CHF and atrial fib She had a successful cardioversion in Feb. 2020 She had a telemedicine visit with Tereso Newcomer, PA in August, 2020  Seemed to be doing well  She had some volume excess and her lasix was increased to 40 BID  Rare episodes of dyspnea with walking . No cp  Still has some leg swelling    Some leg swelling .   Is on  lasix 20 bid,  Has frequent urination   Has ad 1st covid vaccine.     Jul 15, 2019:  Charlene Dickerson is seen today for follow-up visit.  She was hospitalized in mid April with generalized weakness.  She had a UTI , developed acute renal failure , hyperkalemia and then ventricular tachycardia.   While in the emergency room she developed wide-complex tachycardia that is consistent with ventricular tachycardia.  Her potassium was 7.0 at the time.  She had acute renal failure with a creatinine of 5.8.  She is she was successfully cardioverted.  She was bagged briefly but did not lose a pulse.  No CP   She is developed some mild leg edema.  While she was in rehab she was started on Lasix 10 mg a day on Mondays Wednesdays and Fridays.  This seems to control her Lasix.  Her creatinine and potassium seem to be well controlled on this regimen. Echocardiogram from June 20, 2019 reveals normal left ventricular systolic function.  She has grade 1 diastolic dysfunction.  RV function is normal.  PA pressures are at the upper limits of normal.  She has moderate aortic insufficiency.     Past Medical History:  Diagnosis Date  . Diverticulosis of colon   . Essential tremor   . GERD (gastroesophageal reflux disease)  occasional (no meds)  . History of basal cell carcinoma excision 2012  . Hypertension   . Osteoporosis   . PONV (postoperative nausea and vomiting)   . Rectocele    symptomatic    Past Surgical History:  Procedure Laterality Date  . BREAST EXCISIONAL BIOPSY Left 1995  . BREAST EXCISIONAL BIOPSY Right 1988  . CARDIOVERSION N/A 04/16/2018   Procedure: CARDIOVERSION;  Surgeon: Parke Poisson, MD;  Location: [facility] ENDOSCOPY;  Service: Cardiovascular;  Laterality: N/A;  . CATARACT EXTRACTION W/ INTRAOCULAR LENS  IMPLANT, BILATERAL  right 09/ 2013;  left 10/ 2013  . COLONOSCOPY  last one 04/10/ 2012  . CORONARY STENT INTERVENTION N/A 02/18/2018   Procedure: CORONARY STENT INTERVENTION;   Surgeon: Runell Gess, MD;  Location: [facility] INVASIVE CV LAB;  Service: Cardiovascular;  Laterality: N/A;  . D & C HYSTEROSCOPY W/ RESECTION ENDOMETRIAL POLYP AND LESION  07-27-2007   dr Ambrose Mantle @[facility]   . IR KYPHO LUMBAR INC FX REDUCE BONE BX UNI/BIL CANNULATION INC/IMAGING  07/11/2019  . LEFT HEART CATH AND CORONARY ANGIOGRAPHY N/A 02/18/2018   Procedure: LEFT HEART CATH AND CORONARY ANGIOGRAPHY;  Surgeon: 02/20/2018, MD;  Location: [facility] INVASIVE CV LAB;  Service: Cardiovascular;  Laterality: N/A;  . RECTOCELE REPAIR N/A 11/26/2017   Procedure: POSTERIOR REPAIR (RECTOCELE);  Surgeon: 11/28/2017, MD;  Location: [facility];  Service: Gynecology;  Laterality: N/A;  OUTPT IN BED  . TEMPORARY PACEMAKER N/A 02/18/2018   Procedure: TEMPORARY PACEMAKER;  Surgeon: 02/20/2018, MD;  Location: [facility] INVASIVE CV LAB;  Service: Cardiovascular;  Laterality: N/A;  . TUBAL LIGATION Bilateral yrs ago    Current Medications: Current Meds  Medication Sig  . acetaminophen (TYLENOL) 650 MG CR tablet Take 1,300 mg by mouth as needed for pain.  [facility] ALPRAZolam (XANAX) 0.25 MG tablet Take 0.5-1 tablets (0.125-0.25 mg total) by mouth 2 (two) times daily as needed for anxiety.  04-11-1988 apixaban (ELIQUIS) 5 MG TABS tablet TAKE 1 TABLET(5 MG) BY MOUTH TWICE DAILY  . atorvastatin (LIPITOR) 40 MG tablet TAKE 1 TABLET(40 MG) BY MOUTH DAILY AT 6 PM  . calcium citrate (CALCITRATE - DOSED IN MG ELEMENTAL CALCIUM) 950 MG tablet Take 200 mg of elemental calcium by mouth daily.  Marland Kitchen docusate sodium (COLACE) 100 MG capsule Take 100 mg by mouth as needed for mild constipation.  . feeding supplement, ENSURE ENLIVE, (ENSURE ENLIVE) LIQD Take 237 mLs by mouth 2 (two) times daily between meals.  . furosemide (LASIX) 20 MG tablet Take 10 mg by mouth 3 (three) times a week. Monday, Wednesday, and Friday at lunch time  . latanoprost (XALATAN) 0.005 % ophthalmic solution Place 1 drop into both eyes at bedtime.   Wednesday  loratadine (CLARITIN) 10 MG tablet Take 10 mg by mouth daily.  . Multiple Vitamin (MULTIVITAMIN WITH MINERALS) TABS tablet Take 1 tablet by mouth daily.  . nitroGLYCERIN (NITROSTAT) 0.4 MG SL tablet Place 1 tablet (0.4 mg total) under the tongue every 5 (five) minutes as needed for chest pain.  . pantoprazole (PROTONIX) 40 MG tablet TAKE 1 TABLET(40 MG) BY MOUTH AT BEDTIME  . propranolol (INDERAL) 10 MG tablet Take 10 mg by mouth 2 (two) times daily.   . Vitamin D, Ergocalciferol, (DRISDOL) 1.25 MG (50000 UT) CAPS capsule Take 50,000 Units by mouth every Wednesday.   . [DISCONTINUED] aspirin EC 81 MG EC tablet Take 1 tablet (81 mg total) by mouth daily.     Allergies:   Clindamycin/lincomycin  Social History   Socioeconomic History  . Marital status: Widowed    Spouse name: Not on file  . Number of children: Not on file  . Years of education: Not on file  . Highest education level: Not on file  Occupational History  . Not on file  Tobacco Use  . Smoking status: Former Smoker    Quit date: 05/08/1961    Years since quitting: 58.2  . Smokeless tobacco: Never Used  Substance and Sexual Activity  . Alcohol use: No  . Drug use: Never  . Sexual activity: Not on file  Other Topics Concern  . Not on file  Social History Narrative  . Not on file   Social Determinants of Health   Financial Resource Strain:   . Difficulty of Paying Living Expenses:   Food Insecurity:   . Worried About Programme researcher, broadcasting/film/video in the Last Year:   . Barista in the Last Year:   Transportation Needs:   . Freight forwarder (Medical):   Marland Kitchen Lack of Transportation (Non-Medical):   Physical Activity:   . Days of Exercise per Week:   . Minutes of Exercise per Session:   Stress:   . Feeling of Stress :   Social Connections:   . Frequency of Communication with Friends and Family:   . Frequency of Social Gatherings with Friends and Family:   . Attends Religious Services:   . Active Member of Clubs  or Organizations:   . Attends Banker Meetings:   Marland Kitchen Marital Status:      Family History: The patient's family history includes Breast cancer in her daughter, maternal aunt, maternal grandmother, and sister; Cancer in her daughter, maternal aunt, maternal grandmother, and sister; Heart disease in her father and sister.  ROS:   Please see the history of present illness.     All other systems reviewed and are negative.  EKGs/Labs/Other Studies Reviewed:    The following studies were reviewed today:   Recent Labs: 07/20/2018: NT-Pro BNP 2,553 06/20/2019: Magnesium 2.0; TSH 1.610 06/21/2019: B Natriuretic Peptide 330.9 06/23/2019: ALT 17 07/08/2019: BUN 14; Creatinine, Ser 0.65; Hemoglobin 11.8; Platelets 192; Potassium 4.2; Sodium 138  Recent Lipid Panel    Component Value Date/Time   CHOL 184 04/25/2019 1602   TRIG 107 04/25/2019 1602   HDL 86 04/25/2019 1602   CHOLHDL 2.1 04/25/2019 1602   CHOLHDL 2.7 02/18/2018 1405   VLDL 14 02/18/2018 1405   LDLCALC 79 04/25/2019 1602   Physical Exam: Blood pressure (!) 144/76, pulse 88, height 5\' 2"  (1.575 m), weight 144 lb 8 oz (65.5 kg), SpO2 97 %.  GEN:  Well nourished, well developed in no acute distress HEENT: Normal NECK: No JVD; No carotid bruits LYMPHATICS: No lymphadenopathy CARDIAC: RRR , no murmurs, rubs, gallops RESPIRATORY:  Clear to auscultation without rales, wheezing or rhonchi  ABDOMEN: Soft, non-tender, non-distended MUSCULOSKELETAL:  No edema; No deformity  SKIN: Warm and dry NEUROLOGIC:  Alert and oriented x 3   ECG:   ASSESSMENT:    1. Leg pain, bilateral   2. Abnormal peripheral pulse   3. Ventricular tachycardia (HCC)    PLAN:      1.  Ventricular tachycardia: She is admitted to the hospital with acute renal failure and hyperkalemia.  While she was in the emergency room she had an episode of ventricular tachycardia.  She was successfully cardioverted and did not have any recurrences.  It was  thought that this  was due to electrolyte abnormalities.  At this time I do not think she needs any specific treatment for VT.  We certainly need to avoid renal failure and avoid hyperkalemia.  2.  Inferior wall myocardial infarction-  She denies having any episodes of angina.  3..  Acute congestive heart failure:    4..  Paroxysmal atrial fibrillation: Continue Eliquis.  4.  Recent history of acute renal failure: She presented to the hospital with acute renal failure and marked hyperkalemia.  It appears that she had a urinary tract infection.  We will have her continue to check in with her primary medical doctor to watch for signs of UTI.  . Medication Adjustments/Labs and Tests Ordered: Current medicines are reviewed at length with the patient today.  Concerns regarding medicines are outlined above.  Orders Placed This Encounter  Procedures  . VAS [place] LOWER EXTREMITY ARTERIAL DUPLEX   No orders of the defined types were placed in this encounter.   Patient Instructions  Medication Instructions:  Your physician has recommended you make the following change in your medication:  STOP Aspirin  *If you need a refill on your cardiac medications before your next appointment, please call your pharmacy*   Lab Work: None Ordered If you have labs (blood work) drawn today and your tests are completely normal, you will receive your results only by: Marland Kitchen MyChart Message (if you have MyChart) OR . A paper copy in the mail If you have any lab test that is abnormal or we need to change your treatment, we will call you to review the results.   Testing/Procedures: Your physician has requested that you have a lower extremity arterial exercise duplex. During this test, exercise and ultrasound are used to evaluate arterial blood flow in the legs. Allow one hour for this exam. There are no restrictions or special instructions.    Follow-Up: At [facility], you and your health needs are our  priority.  As part of our continuing mission to provide you with exceptional heart care, we have created designated Provider Care Teams.  These Care Teams include your primary Cardiologist (physician) and Advanced Practice Providers (APPs -  Physician Assistants and Nurse Practitioners) who all work together to provide you with the care you need, when you need it.   Your next appointment:   6 month(s)  The format for your next appointment:   Either In Person or Virtual  Provider:   You may see Mertie Moores, MD or one of the following Advanced Practice Providers on your designated Care Team:    [name], PA-C  Robbie Lis, [place]         Signed, Mertie Moores, MD  07/15/2019 5:08 PM    [place]

## 2019-07-25 ENCOUNTER — Other Ambulatory Visit: Payer: Self-pay | Admitting: Cardiovascular Disease

## 2019-07-25 DIAGNOSIS — R0989 Other specified symptoms and signs involving the circulatory and respiratory systems: Secondary | ICD-10-CM

## 2019-07-25 DIAGNOSIS — M79604 Pain in right leg: Secondary | ICD-10-CM

## 2019-08-02 ENCOUNTER — Other Ambulatory Visit: Payer: Self-pay

## 2019-08-02 ENCOUNTER — Ambulatory Visit (HOSPITAL_COMMUNITY)
Admission: RE | Admit: 2019-08-02 | Discharge: 2019-08-02 | Disposition: A | Discharge status: Home / Self Care | Payer: Medicare Other | Source: Ambulatory Visit | Attending: Cardiology | Admitting: Cardiology

## 2019-08-02 ENCOUNTER — Telehealth: Payer: Self-pay | Admitting: Nurse Practitioner

## 2019-08-02 DIAGNOSIS — M79604 Pain in right leg: Secondary | ICD-10-CM | POA: Insufficient documentation

## 2019-08-02 DIAGNOSIS — M79605 Pain in left leg: Secondary | ICD-10-CM

## 2019-08-02 DIAGNOSIS — I739 Peripheral vascular disease, unspecified: Secondary | ICD-10-CM

## 2019-08-02 DIAGNOSIS — R0989 Other specified symptoms and signs involving the circulatory and respiratory systems: Secondary | ICD-10-CM

## 2019-08-02 NOTE — Telephone Encounter (Signed)
-----   Message from Vesta Mixer, MD sent at 08/02/2019  5:31 PM EDT ----- She has an occluded posterior tibialis artery Please set up an appt with Dr. Kirke Corin for vascular consultation

## 2019-08-02 NOTE — Telephone Encounter (Signed)
Reviewed results and plan of care with patient who verbalized understanding and agreement. She is aware that Dr. Jari Sportsman office will call her to schedule an appointment and is agreeable with that plan. She thanked me for the call.

## 2019-08-25 ENCOUNTER — Telehealth: Payer: Self-pay | Admitting: Cardiovascular Disease

## 2019-08-25 NOTE — Telephone Encounter (Signed)
Patient calling to request her daughter [name] come with her to her appointment 08/30/2019, because she states she uses a walker.

## 2019-08-25 NOTE — Telephone Encounter (Signed)
Patient aware OK for daughter to come to appointment

## 2019-08-30 ENCOUNTER — Encounter: Payer: Self-pay | Admitting: Cardiovascular Disease

## 2019-08-30 ENCOUNTER — Ambulatory Visit (INDEPENDENT_AMBULATORY_CARE_PROVIDER_SITE_OTHER): Payer: Medicare Other | Admitting: Cardiovascular Disease

## 2019-08-30 ENCOUNTER — Other Ambulatory Visit: Payer: Self-pay

## 2019-08-30 VITALS — BP 150/74 | HR 77 | Temp 96.8°F | Ht 62.0 in | Wt 145.0 lb

## 2019-08-30 DIAGNOSIS — I2510 Atherosclerotic heart disease of native coronary artery without angina pectoris: Secondary | ICD-10-CM | POA: Diagnosis not present

## 2019-08-30 DIAGNOSIS — E785 Hyperlipidemia, unspecified: Secondary | ICD-10-CM | POA: Diagnosis not present

## 2019-08-30 DIAGNOSIS — I739 Peripheral vascular disease, unspecified: Secondary | ICD-10-CM | POA: Diagnosis not present

## 2019-08-30 DIAGNOSIS — I480 Paroxysmal atrial fibrillation: Secondary | ICD-10-CM | POA: Diagnosis not present

## 2019-08-30 NOTE — Patient Instructions (Signed)

## 2019-08-30 NOTE — Progress Notes (Signed)
Cardiology Office Note   Date:  08/30/2019   ID:  Charlene Dickerson, DOB 1932-06-11, MRN [id number]  PCP:  Martha Clan, MD  Cardiologist:  Dr. Elease Hashimoto  No chief complaint on file.     History of Present Illness: Charlene Dickerson is a 86 y.o. female who was referred by Dr. Elease Hashimoto for evaluation management of peripheral arterial disease. She has known history of coronary artery disease with previous inferior myocardial infarction complicated by right ventricular failure in February 2020.  She also has a history of paroxysmal atrial fibrillation on long-term anticoagulation with Eliquis. She was hospitalized in April with generalized weakness and UTI.  She had acute renal failure, hyperkalemia and ventricular tachycardia. Other medical problems include essential hypertension and hyperlipidemia.  She is not diabetic and not a smoker.  She is very active at baseline.  She recently complained of some neck discomfort from the knees down mostly in the anterior part.  No calf or thigh discomfort.  She underwent vascular studies recently which showed normal [name] bilaterally at 0.98.  Duplex showed occluded posterior tibial artery bilaterally but otherwise no obstructive disease.  Past Medical History:  Diagnosis Date  . Diverticulosis of colon   . Essential tremor   . GERD (gastroesophageal reflux disease)    occasional (no meds)  . History of basal cell carcinoma excision 2012  . Hypertension   . Osteoporosis   . PONV (postoperative nausea and vomiting)   . Rectocele    symptomatic    Past Surgical History:  Procedure Laterality Date  . BREAST EXCISIONAL BIOPSY Left 1995  . BREAST EXCISIONAL BIOPSY Right 1988  . CARDIOVERSION N/A 04/16/2018   Procedure: CARDIOVERSION;  Surgeon: Parke Poisson, MD;  Location: [facility] ENDOSCOPY;  Service: Cardiovascular;  Laterality: N/A;  . CATARACT EXTRACTION W/ INTRAOCULAR LENS  IMPLANT, BILATERAL  right 09/ 2013;  left 10/ 2013  . COLONOSCOPY  last  one 04/10/ 2012  . CORONARY STENT INTERVENTION N/A 02/18/2018   Procedure: CORONARY STENT INTERVENTION;  Surgeon: Runell Gess, MD;  Location: [facility] INVASIVE CV LAB;  Service: Cardiovascular;  Laterality: N/A;  . D & C HYSTEROSCOPY W/ RESECTION ENDOMETRIAL POLYP AND LESION  07-27-2007   dr Ambrose Mantle @[facility]   . IR KYPHO LUMBAR INC FX REDUCE BONE BX UNI/BIL CANNULATION INC/IMAGING  07/11/2019  . LEFT HEART CATH AND CORONARY ANGIOGRAPHY N/A 02/18/2018   Procedure: LEFT HEART CATH AND CORONARY ANGIOGRAPHY;  Surgeon: 02/20/2018, MD;  Location: [facility] INVASIVE CV LAB;  Service: Cardiovascular;  Laterality: N/A;  . RECTOCELE REPAIR N/A 11/26/2017   Procedure: POSTERIOR REPAIR (RECTOCELE);  Surgeon: 11/28/2017, MD;  Location: [facility];  Service: Gynecology;  Laterality: N/A;  OUTPT IN BED  . TEMPORARY PACEMAKER N/A 02/18/2018   Procedure: TEMPORARY PACEMAKER;  Surgeon: 02/20/2018, MD;  Location: [facility] INVASIVE CV LAB;  Service: Cardiovascular;  Laterality: N/A;  . TUBAL LIGATION Bilateral yrs ago     Current Outpatient Medications  Medication Sig Dispense Refill  . acetaminophen (TYLENOL) 650 MG CR tablet Take 1,300 mg by mouth as needed for pain.    [facility] ALPRAZolam (XANAX) 0.25 MG tablet Take 0.5-1 tablets (0.125-0.25 mg total) by mouth 2 (two) times daily as needed for anxiety. 3 tablet 0  . apixaban (ELIQUIS) 5 MG TABS tablet TAKE 1 TABLET(5 MG) BY MOUTH TWICE DAILY 180 tablet 1  . atorvastatin (LIPITOR) 40 MG tablet TAKE 1 TABLET(40 MG) BY MOUTH DAILY AT 6 PM 90 tablet  3  . calcium citrate (CALCITRATE - DOSED IN MG ELEMENTAL CALCIUM) 950 MG tablet Take 200 mg of elemental calcium by mouth daily.    Marland Kitchen docusate sodium (COLACE) 100 MG capsule Take 100 mg by mouth as needed for mild constipation.    . feeding supplement, ENSURE ENLIVE, (ENSURE ENLIVE) LIQD Take 237 mLs by mouth 2 (two) times daily between meals. 237 mL 12  . furosemide (LASIX) 20 MG tablet Take 10 mg by mouth 3  (three) times a week. Monday, Wednesday, and Friday at lunch time    . latanoprost (XALATAN) 0.005 % ophthalmic solution Place 1 drop into both eyes at bedtime.     Marland Kitchen loratadine (CLARITIN) 10 MG tablet Take 10 mg by mouth daily.    . Multiple Vitamin (MULTIVITAMIN WITH MINERALS) TABS tablet Take 1 tablet by mouth daily.    . pantoprazole (PROTONIX) 40 MG tablet TAKE 1 TABLET(40 MG) BY MOUTH AT BEDTIME 90 tablet 1  . propranolol (INDERAL) 10 MG tablet Take 10 mg by mouth 2 (two) times daily.     . Vitamin D, Ergocalciferol, (DRISDOL) 1.25 MG (50000 UT) CAPS capsule Take 50,000 Units by mouth every Wednesday.     . nitroGLYCERIN (NITROSTAT) 0.4 MG SL tablet Place 1 tablet (0.4 mg total) under the tongue every 5 (five) minutes as needed for chest pain. 25 tablet 3   No current facility-administered medications for this visit.    Allergies:   Clindamycin/lincomycin    Social History:  The patient  reports that she quit smoking about 58 years ago. She has never used smokeless tobacco. She reports that she does not drink alcohol and does not use drugs.   Family History:  The patient's family history includes Breast cancer in her daughter, maternal aunt, maternal grandmother, and sister; Cancer in her daughter, maternal aunt, maternal grandmother, and sister; Heart disease in her father and sister.    ROS:  Please see the history of present illness.   Otherwise, review of systems are positive for none.   All other systems are reviewed and negative.    PHYSICAL EXAM: VS:  BP (!) 150/74   Pulse 77   Temp (!) 96.8 F (36 C)   Ht 5\' 2"  (1.575 m)   Wt 145 lb (65.8 kg)   SpO2 95%   BMI 26.52 kg/m  , BMI Body mass index is 26.52 kg/m. GEN: Well nourished, well developed, in no acute distress  HEENT: normal  Neck: no JVD, carotid bruits, or masses Cardiac: RRR; no murmurs, rubs, or gallops,no edema  Respiratory:  clear to auscultation bilaterally, normal work of breathing GI: soft, nontender,  nondistended, + BS MS: no deformity or atrophy  Skin: warm and dry, no rash Neuro:  Strength and sensation are intact Psych: euthymic mood, full affect Vascular: Femoral pulses normal bilaterally.  Dorsalis pedis is palpable.   EKG:  EKG is not ordered today.    Recent Labs: 06/20/2019: Magnesium 2.0; TSH 1.610 06/21/2019: B Natriuretic Peptide 330.9 06/23/2019: ALT 17 07/08/2019: BUN 14; Creatinine, Ser 0.65; Hemoglobin 11.8; Platelets 192; Potassium 4.2; Sodium 138    Lipid Panel    Component Value Date/Time   CHOL 184 04/25/2019 1602   TRIG 107 04/25/2019 1602   HDL 86 04/25/2019 1602   CHOLHDL 2.1 04/25/2019 1602   CHOLHDL 2.7 02/18/2018 1405   VLDL 14 02/18/2018 1405   LDLCALC 79 04/25/2019 1602      Wt Readings from Last 3 Encounters:  08/30/19 145 lb (  65.8 kg)  07/15/19 144 lb 8 oz (65.5 kg)  07/11/19 138 lb 14.2 oz (63 kg)       No flowsheet data found.    ASSESSMENT AND PLAN:  1.  Peripheral arterial disease: Duplex showed occluded posterior tibial artery distally.  However, [name] was normal and there was no evidence of obstructive disease in the major vessels above the knee.  Her current symptoms are not consistent with claudication and I think the posterior tibial occlusion was an incidental finding.  There is no indication for revascularization.  I recommend continuing medical therapy.  2.  Coronary artery disease involving native coronary arteries without angina: She is doing well at the present time.  3.  Paroxysmal atrial fibrillation: Seems to be maintaining sinus rhythm.  She is on long-term anticoagulation with Eliquis.  4.  Hyperlipidemia: Continue treatment with atorvastatin with a target LDL of less than 70.    Disposition:   FU with me as needed  Signed,  Lorine Bears, MD  08/30/2019 11:21 AM    [facility]

## 2019-10-13 ENCOUNTER — Other Ambulatory Visit: Payer: Self-pay | Admitting: Internal Medicine

## 2019-10-13 DIAGNOSIS — Z1231 Encounter for screening mammogram for malignant neoplasm of breast: Secondary | ICD-10-CM

## 2019-10-19 ENCOUNTER — Telehealth: Payer: Self-pay | Admitting: Cardiovascular Disease

## 2019-10-19 DIAGNOSIS — I5032 Chronic diastolic (congestive) heart failure: Secondary | ICD-10-CM

## 2019-10-19 NOTE — Telephone Encounter (Signed)
I agree with plan by Nicholes Calamity, RN

## 2019-10-19 NOTE — Telephone Encounter (Signed)
Pt c/o swelling: STAT is pt has developed SOB within 24 hours  1) How much weight have you gained and in what time span? No weight gain, she states she takes her weights every morning along with all of her vitals and there has been no weight gain   2) If swelling, where is the swelling located? Feet, ankles and legs. She states they are very tight and sore and that it is like pitting adema.  3) Are you currently taking a fluid pill? Yes, lasix   4) Are you currently SOB? No  5) Do you have a log of your daily weights (if so, list)?   6) Have you gained 3 pounds in a day or 5 pounds in a week? No  7) Have you traveled recently? No

## 2019-10-19 NOTE — Telephone Encounter (Signed)
Spoke with the patient who states that she is having some increased swelling in both legs, mostly around her ankles. She is taking Lasix 10 mg three times per week. She denies any SOB or weight gain. She states that she has been elevating her feet as much as possible. She has not been using her compression stockings, which I advised her to use. She denies any increase in salt intake.  Advised patient to take her Lasix daily for the next few days and come in on Monday to check BMET. Patient in agreement with plan.

## 2019-10-22 ENCOUNTER — Emergency Department (HOSPITAL_COMMUNITY): Payer: Medicare Other

## 2019-10-22 ENCOUNTER — Other Ambulatory Visit: Payer: Self-pay

## 2019-10-22 ENCOUNTER — Encounter (HOSPITAL_COMMUNITY): Payer: Self-pay | Admitting: *Deleted

## 2019-10-22 ENCOUNTER — Inpatient Hospital Stay (HOSPITAL_COMMUNITY)
Admission: EM | Admit: 2019-10-22 | Discharge: 2019-10-25 | DRG: 092 | Disposition: A | Discharge status: Skilled Nursing Facility | Payer: Medicare Other | Attending: Internal Medicine | Admitting: Internal Medicine

## 2019-10-22 DIAGNOSIS — I480 Paroxysmal atrial fibrillation: Secondary | ICD-10-CM | POA: Diagnosis present

## 2019-10-22 DIAGNOSIS — Z8249 Family history of ischemic heart disease and other diseases of the circulatory system: Secondary | ICD-10-CM

## 2019-10-22 DIAGNOSIS — I5032 Chronic diastolic (congestive) heart failure: Secondary | ICD-10-CM | POA: Diagnosis present

## 2019-10-22 DIAGNOSIS — I252 Old myocardial infarction: Secondary | ICD-10-CM

## 2019-10-22 DIAGNOSIS — Z881 Allergy status to other antibiotic agents status: Secondary | ICD-10-CM

## 2019-10-22 DIAGNOSIS — E785 Hyperlipidemia, unspecified: Secondary | ICD-10-CM | POA: Diagnosis present

## 2019-10-22 DIAGNOSIS — Z85828 Personal history of other malignant neoplasm of skin: Secondary | ICD-10-CM

## 2019-10-22 DIAGNOSIS — R4789 Other speech disturbances: Secondary | ICD-10-CM | POA: Diagnosis present

## 2019-10-22 DIAGNOSIS — I472 Ventricular tachycardia, unspecified: Secondary | ICD-10-CM

## 2019-10-22 DIAGNOSIS — N816 Rectocele: Secondary | ICD-10-CM | POA: Diagnosis present

## 2019-10-22 DIAGNOSIS — Z87891 Personal history of nicotine dependence: Secondary | ICD-10-CM

## 2019-10-22 DIAGNOSIS — Z79899 Other long term (current) drug therapy: Secondary | ICD-10-CM

## 2019-10-22 DIAGNOSIS — K219 Gastro-esophageal reflux disease without esophagitis: Secondary | ICD-10-CM | POA: Diagnosis present

## 2019-10-22 DIAGNOSIS — E538 Deficiency of other specified B group vitamins: Secondary | ICD-10-CM | POA: Diagnosis present

## 2019-10-22 DIAGNOSIS — Z20822 Contact with and (suspected) exposure to covid-19: Secondary | ICD-10-CM | POA: Diagnosis present

## 2019-10-22 DIAGNOSIS — I10 Essential (primary) hypertension: Secondary | ICD-10-CM | POA: Diagnosis present

## 2019-10-22 DIAGNOSIS — R4189 Other symptoms and signs involving cognitive functions and awareness: Secondary | ICD-10-CM | POA: Diagnosis present

## 2019-10-22 DIAGNOSIS — I2510 Atherosclerotic heart disease of native coronary artery without angina pectoris: Secondary | ICD-10-CM | POA: Diagnosis not present

## 2019-10-22 DIAGNOSIS — Z888 Allergy status to other drugs, medicaments and biological substances status: Secondary | ICD-10-CM

## 2019-10-22 DIAGNOSIS — I110 Hypertensive heart disease with heart failure: Secondary | ICD-10-CM | POA: Diagnosis present

## 2019-10-22 DIAGNOSIS — R4701 Aphasia: Principal | ICD-10-CM

## 2019-10-22 DIAGNOSIS — R441 Visual hallucinations: Secondary | ICD-10-CM | POA: Diagnosis present

## 2019-10-22 DIAGNOSIS — Z7901 Long term (current) use of anticoagulants: Secondary | ICD-10-CM

## 2019-10-22 DIAGNOSIS — Z8679 Personal history of other diseases of the circulatory system: Secondary | ICD-10-CM

## 2019-10-22 DIAGNOSIS — I5082 Biventricular heart failure: Secondary | ICD-10-CM | POA: Diagnosis present

## 2019-10-22 DIAGNOSIS — Z803 Family history of malignant neoplasm of breast: Secondary | ICD-10-CM

## 2019-10-22 DIAGNOSIS — Z955 Presence of coronary angioplasty implant and graft: Secondary | ICD-10-CM

## 2019-10-22 DIAGNOSIS — R488 Other symbolic dysfunctions: Secondary | ICD-10-CM | POA: Diagnosis present

## 2019-10-22 DIAGNOSIS — M810 Age-related osteoporosis without current pathological fracture: Secondary | ICD-10-CM | POA: Diagnosis present

## 2019-10-22 DIAGNOSIS — G250 Essential tremor: Secondary | ICD-10-CM | POA: Diagnosis present

## 2019-10-22 LAB — CBC
HCT: 41.9 % (ref 36.0–46.0)
Hemoglobin: 13.6 g/dL (ref 12.0–15.0)
MCH: 32.5 pg (ref 26.0–34.0)
MCHC: 32.5 g/dL (ref 30.0–36.0)
MCV: 100 fL (ref 80.0–100.0)
Platelets: 176 10*3/uL (ref 150–400)
RBC: 4.19 MIL/uL (ref 3.87–5.11)
RDW: 12.4 % (ref 11.5–15.5)
WBC: 5.1 10*3/uL (ref 4.0–10.5)
nRBC: 0 % (ref 0.0–0.2)

## 2019-10-22 LAB — I-STAT CHEM 8, ED
BUN: 17 mg/dL (ref 8–23)
Calcium, Ion: 1.09 mmol/L — ABNORMAL LOW (ref 1.15–1.40)
Chloride: 104 mmol/L (ref 98–111)
Creatinine, Ser: 0.7 mg/dL (ref 0.44–1.00)
Glucose, Bld: 95 mg/dL (ref 70–99)
HCT: 41 % (ref 36.0–46.0)
Hemoglobin: 13.9 g/dL (ref 12.0–15.0)
Potassium: 4 mmol/L (ref 3.5–5.1)
Sodium: 137 mmol/L (ref 135–145)
TCO2: 22 mmol/L (ref 22–32)

## 2019-10-22 LAB — HEMOGLOBIN A1C
Hgb A1c MFr Bld: 5.4 % (ref 4.8–5.6)
Mean Plasma Glucose: 108.28 mg/dL

## 2019-10-22 LAB — COMPREHENSIVE METABOLIC PANEL
ALT: 17 U/L (ref 0–44)
AST: 18 U/L (ref 15–41)
Albumin: 3.8 g/dL (ref 3.5–5.0)
Alkaline Phosphatase: 41 U/L (ref 38–126)
Anion gap: 10 (ref 5–15)
BUN: 16 mg/dL (ref 8–23)
CO2: 22 mmol/L (ref 22–32)
Calcium: 9.3 mg/dL (ref 8.9–10.3)
Chloride: 104 mmol/L (ref 98–111)
Creatinine, Ser: 0.77 mg/dL (ref 0.44–1.00)
GFR calc Af Amer: >60 mL/min (ref >60)
GFR calc non Af Amer: >60 mL/min (ref >60)
Glucose, Bld: 97 mg/dL (ref 70–99)
Potassium: 4.2 mmol/L (ref 3.5–5.1)
Sodium: 136 mmol/L (ref 135–145)
Total Bilirubin: 0.9 mg/dL (ref 0.3–1.2)
Total Protein: 6.7 g/dL (ref 6.5–8.1)

## 2019-10-22 LAB — LIPID PANEL
Cholesterol: 187 mg/dL (ref 0–200)
HDL: 83 mg/dL (ref >40)
LDL Cholesterol: 91 mg/dL (ref 0–99)
Total CHOL/HDL Ratio: 2.3 RATIO
Triglycerides: 63 mg/dL (ref <150)
VLDL: 13 mg/dL (ref 0–40)

## 2019-10-22 LAB — AMMONIA: Ammonia: 29 umol/L (ref 9–35)

## 2019-10-22 LAB — DIFFERENTIAL
Abs Immature Granulocytes: 0.01 10*3/uL (ref 0.00–0.07)
Basophils Absolute: 0 10*3/uL (ref 0.0–0.1)
Basophils Relative: 0 %
Eosinophils Absolute: 0.1 10*3/uL (ref 0.0–0.5)
Eosinophils Relative: 1 %
Immature Granulocytes: 0 %
Lymphocytes Relative: 23 %
Lymphs Abs: 1.2 10*3/uL (ref 0.7–4.0)
Monocytes Absolute: 0.6 10*3/uL (ref 0.1–1.0)
Monocytes Relative: 12 %
Neutro Abs: 3.2 10*3/uL (ref 1.7–7.7)
Neutrophils Relative %: 64 %

## 2019-10-22 LAB — APTT: aPTT: 32 seconds (ref 24–36)

## 2019-10-22 LAB — PROTIME-INR
INR: 1.1 (ref 0.8–1.2)
Prothrombin Time: 14.2 seconds (ref 11.4–15.2)

## 2019-10-22 LAB — SARS CORONAVIRUS 2 BY RT PCR (HOSPITAL ORDER, PERFORMED IN ~~LOC~~ HOSPITAL LAB): SARS Coronavirus 2: NEGATIVE

## 2019-10-22 MED ORDER — IOHEXOL 350 MG/ML SOLN
60.0000 mL | Freq: Once | INTRAVENOUS | Status: AC | PRN
  Administered 2019-10-22: 60 mL via INTRAVENOUS

## 2019-10-22 MED ORDER — ACETAMINOPHEN 325 MG PO TABS
650.0000 mg | ORAL_TABLET | ORAL | Status: DC | PRN
  Administered 2019-10-23: 650 mg via ORAL
  Filled 2019-10-22: qty 2

## 2019-10-22 MED ORDER — STROKE: EARLY STAGES OF RECOVERY BOOK: Freq: Once | Status: DC

## 2019-10-22 MED ORDER — ACETAMINOPHEN 650 MG RE SUPP: 650.0000 mg | RECTAL | Status: DC | PRN

## 2019-10-22 MED ORDER — ACETAMINOPHEN 160 MG/5ML PO SOLN: 650.0000 mg | ORAL | Status: DC | PRN

## 2019-10-22 MED ORDER — SENNOSIDES-DOCUSATE SODIUM 8.6-50 MG PO TABS
1.0000 | ORAL_TABLET | Freq: Every evening | ORAL | Status: DC | PRN
  Administered 2019-10-23: 1 via ORAL
  Filled 2019-10-22: qty 1

## 2019-10-22 MED ORDER — GADOBUTROL 1 MMOL/ML IV SOLN
10.0000 mL | Freq: Once | INTRAVENOUS | Status: AC | PRN
  Administered 2019-10-22: 1 mL via INTRAVENOUS

## 2019-10-22 MED ORDER — SODIUM CHLORIDE 0.9% FLUSH: 3.0000 mL | Freq: Once | INTRAVENOUS | Status: DC

## 2019-10-22 NOTE — H&P (Signed)
History and Physical  Charlene Dickerson SJG:[id number] DOB: 19-Jun-1932 DOA: 10/22/2019  Referring physician: Lynden Oxford PCP: Martha Clan, MD  Outpatient Specialists: Dr. Kristeen Miss (cardiology) Patient coming from: Home At her baseline ambulates independently  Chief Complaint: Right plantar difficulty for several days  HPI: Charlene Dickerson is a 86 y.o. female with medical history significant for paroxysmal atrial fibrillation on Eliquis, CAD status post STEMI with DES x2 (01/2018), history of ventricular tachycardia, HTN, HLD who presents on 10/22/2019 with several days of word finding difficulties.   History obtained by patient and her son at bedside   She sent a text message to son's wife at around 11 pm Thursday night that didn't make sense.  They didn't see it to the following morning. Her son immediately called her to check in because of the confusing text on Friday morning at 9 :04 am.     Son said again this morning at 9 am she sent another text message that didn't make sense. He called to check in on her and she sounded fine but wanted to come see her to make sure she was ok ( he lives 2 hours away)  Her across the street neighbor came around 10 to deliver her mail and Mrs. Brereton noticed she was having problems finding her words.  She told her son she had been noticing confusion of words with text messages for a few days when he came to see her.   Her son took her to ED.    She states she is not on eliquis. She states that she has been on plavix since Dec 2019. Dr. Elease Hashimoto is her cardiologist ( due to heart attack)  She was changed this week from lasix every day due to worsening swelling by her cardiology team.   She reports she was told on recent outpt visit she had abnormal liver numbers on recent lab work-up     ED Course:   Afebrile, blood pressure 148/83.  CMP unremarkable, INR unremarkable, CBC unremarkable.  Given patient's symptoms over finding difficulty  code stroke was called.  Patient underwent CT head which showed no acute findings.  Also underwent CTA of head and neck which showed no emergent findings, did show appearance of undulating right cervical ICA.  ED provider consulted neurology who advised admission for stroke work-up given concern for conductive aphasia.  TRH was called for further management   Review of Systems: As mentioned in the history of present illness.Review of systems are otherwise negative Patient seen in the ED   Past Medical History:  Diagnosis Date  . Diverticulosis of colon   . Essential tremor   . GERD (gastroesophageal reflux disease)    occasional (no meds)  . History of basal cell carcinoma excision 2012  . Hypertension   . Osteoporosis   . PONV (postoperative nausea and vomiting)   . Rectocele    symptomatic   Past Surgical History:  Procedure Laterality Date  . BREAST EXCISIONAL BIOPSY Left 1995  . BREAST EXCISIONAL BIOPSY Right 1988  . CARDIOVERSION N/A 04/16/2018   Procedure: CARDIOVERSION;  Surgeon: Parke Poisson, MD;  Location: [facility] ENDOSCOPY;  Service: Cardiovascular;  Laterality: N/A;  . CATARACT EXTRACTION W/ INTRAOCULAR LENS  IMPLANT, BILATERAL  right 09/ 2013;  left 10/ 2013  . COLONOSCOPY  last one 04/10/ 2012  . CORONARY STENT INTERVENTION N/A 02/18/2018   Procedure: CORONARY STENT INTERVENTION;  Surgeon: Runell Gess, MD;  Location: [facility] INVASIVE CV LAB;  Service: Cardiovascular;  Laterality: N/A;  . D & C HYSTEROSCOPY W/ RESECTION ENDOMETRIAL POLYP AND LESION  07-27-2007   dr Ambrose Mantle @[facility]   . IR KYPHO LUMBAR INC FX REDUCE BONE BX UNI/BIL CANNULATION INC/IMAGING  07/11/2019  . LEFT HEART CATH AND CORONARY ANGIOGRAPHY N/A 02/18/2018   Procedure: LEFT HEART CATH AND CORONARY ANGIOGRAPHY;  Surgeon: 02/20/2018, MD;  Location: [facility] INVASIVE CV LAB;  Service: Cardiovascular;  Laterality: N/A;  . RECTOCELE REPAIR N/A 11/26/2017   Procedure: POSTERIOR REPAIR (RECTOCELE);  Surgeon:  11/28/2017, MD;  Location: [facility];  Service: Gynecology;  Laterality: N/A;  OUTPT IN BED  . TEMPORARY PACEMAKER N/A 02/18/2018   Procedure: TEMPORARY PACEMAKER;  Surgeon: 02/20/2018, MD;  Location: [facility] INVASIVE CV LAB;  Service: Cardiovascular;  Laterality: N/A;  . TUBAL LIGATION Bilateral yrs ago   Allergies  Allergen Reactions  . Clindamycin/Lincomycin Diarrhea    "c-diff"   Social History:  reports that she quit smoking about 58 years ago. She has never used smokeless tobacco. She reports that she does not drink alcohol and does not use drugs. Family History  Problem Relation Age of Onset  . Cancer Sister        Breast  . Heart disease Sister   . Breast cancer Sister   . Heart disease Father   . Cancer Daughter        breast  . Breast cancer Daughter   . Cancer Maternal Aunt        breast  . Breast cancer Maternal Aunt   . Cancer Maternal Grandmother        breast  . Breast cancer Maternal Grandmother       Prior to Admission medications   Medication Sig Start Date End Date Taking? Authorizing Provider  acetaminophen (TYLENOL) 650 MG CR tablet Take 1,300 mg by mouth as needed for pain.    [provider]  ALPRAZolam [facility]) 0.25 MG tablet Take 0.5-1 tablets (0.125-0.25 mg total) by mouth 2 (two) times daily as needed for anxiety. 06/24/19   06/26/19, MD  apixaban (ELIQUIS) 5 MG TABS tablet TAKE 1 TABLET(5 MG) BY MOUTH TWICE DAILY 06/13/19   Nahser, 08/13/19, MD  atorvastatin (LIPITOR) 40 MG tablet TAKE 1 TABLET(40 MG) BY MOUTH DAILY AT 6 PM 04/26/19   Nahser, 04/28/19, MD  calcium citrate (CALCITRATE - DOSED IN MG ELEMENTAL CALCIUM) 950 MG tablet Take 200 mg of elemental calcium by mouth daily.    [provider]  docusate sodium (COLACE) 100 MG capsule Take 100 mg by mouth as needed for mild constipation.    [provider]  feeding supplement, ENSURE ENLIVE, (ENSURE ENLIVE) LIQD Take 237 mLs by mouth 2 (two) times  daily between meals. 06/24/19   06/26/19, MD  furosemide (LASIX) 20 MG tablet Take 10 mg by mouth 3 (three) times a week. Monday, Wednesday, and Friday at lunch time    [provider]  latanoprost (XALATAN) 0.005 % ophthalmic solution Place 1 drop into both eyes at bedtime.  04/17/11   [provider]  loratadine (CLARITIN) 10 MG tablet Take 10 mg by mouth daily.    [provider]  Multiple Vitamin (MULTIVITAMIN WITH MINERALS) TABS tablet Take 1 tablet by mouth daily.    [provider]  nitroGLYCERIN (NITROSTAT) 0.4 MG SL tablet Place 1 tablet (0.4 mg total) under the tongue every 5 (five) minutes as needed for chest pain. 02/24/18 07/15/19  07/17/19,  PA-C  pantoprazole (PROTONIX) 40 MG tablet TAKE 1 TABLET(40 MG) BY MOUTH AT BEDTIME 03/28/19   Robbie Lis M, PA-C  propranolol (INDERAL) 10 MG tablet Take 10 mg by mouth 2 (two) times daily.     [provider]  Vitamin D, Ergocalciferol, (DRISDOL) 1.25 MG (50000 UT) CAPS capsule Take 50,000 Units by mouth every Wednesday.     [provider]    Physical Exam: BP (!) 148/83 (BP Location: Right Arm)   Pulse 80   Temp 98.4 F (36.9 C) (Oral)   Resp (!) 21   Ht  (1.575 m)   Wt 64.9 kg   SpO2 (!) 77%   BMI 26.16 kg/m   Constitutional normal appearing female Eyes: EOMI, anicteric, normal conjunctivae ENMT: Oropharynx with moist mucous membranes, normal dentition Cardiovascular: RRR no MRGs, with no peripheral edema Respiratory: Normal respiratory effort on room air, clear breath sounds  Abdomen: Soft,non-tender, with no HSM Skin: No rash ulcers, or lesions. Without skin tenting  Neurologic: No facial droop, no slurred speech, moving all 4 extremities with no appreciable focal deficits, alert and oriented x4 Psychiatric:Appropriate affect, and mood. Mental status AAOx3          Labs on Admission:  Basic Metabolic Panel: Recent Labs  Lab 10/22/19 1350  10/22/19 1355  NA 136 137  K 4.2 4.0  CL 104 104  CO2 22  --   GLUCOSE 97 95  BUN 16 17  CREATININE 0.77 0.70  CALCIUM 9.3  --    Liver Function Tests: Recent Labs  Lab 10/22/19 1350  AST 18  ALT 17  ALKPHOS 41  BILITOT 0.9  PROT 6.7  ALBUMIN 3.8   No results for input(s): LIPASE, AMYLASE in the last 168 hours. No results for input(s): AMMONIA in the last 168 hours. CBC: Recent Labs  Lab 10/22/19 1350 10/22/19 1355  WBC 5.1  --   NEUTROABS 3.2  --   HGB 13.6 13.9  HCT 41.9 41.0  MCV 100.0  --   PLT 176  --    Cardiac Enzymes: No results for input(s): CKTOTAL, CKMB, CKMBINDEX, TROPONINI in the last 168 hours.  BNP (last 3 results) Recent Labs    06/20/19 0528 06/21/19 0246  BNP 144.3* 330.9*    ProBNP (last 3 results) No results for input(s): PROBNP in the last 8760 hours.  CBG: No results for input(s): GLUCAP in the last 168 hours.  Radiological Exams on Admission: CT Code Stroke CTA Head W/WO contrast  Result Date: 10/22/2019 CLINICAL DATA:  Code stroke for aphasia. EXAM: CT ANGIOGRAPHY HEAD AND NECK TECHNIQUE: Multidetector CT imaging of the head and neck was performed using the standard protocol during bolus administration of intravenous contrast. Multiplanar CT image reconstructions and MIPs were obtained to evaluate the vascular anatomy. Carotid stenosis measurements (when applicable) are obtained utilizing NASCET criteria, using the distal internal carotid diameter as the denominator. CONTRAST:  60mL OMNIPAQUE IOHEXOL 350 MG/ML SOLN COMPARISON:  Noncontrast head CT earlier today FINDINGS: CTA NECK FINDINGS Aortic arch: Atheromatous plaque. No acute finding. Three vessel branching. Right carotid system: Calcified plaque at the bifurcation without stenosis or ulceration. Mild undulation of the ICA lumen. Negative for dissection. Left carotid system: Mild calcified plaque at the bifurcation. No stenosis, ulceration, or beading. Vertebral arteries: No  proximal subclavian stenosis. Left vertebral artery dominance. The vertebral arteries are smooth and widely patent. Skeleton: Negative Other neck: No evidence of mass or inflammation. Upper chest: Negative Review of the MIP  images confirms the above findings CTA HEAD FINDINGS Anterior circulation: Calcified plaque on the carotid siphons without stenosis or major branch occlusion. Negative for aneurysm or beading. Posterior circulation: Left vertebral artery dominance. The vertebral and basilar arteries are widely patent. Arterial elongation without aneurysm. No branch occlusion or proximal stenosis Venous sinuses: Patent as permitted by contrast timing Anatomic variants: As above Review of the MIP images confirms the above findings IMPRESSION: 1. No emergent finding. 2. Undulating appearance of the right cervical ICA, possible mild FMD. 3. Atherosclerosis without proximal stenosis, mild for age. Electronically Signed   By: Marnee Spring M.D.   On: 10/22/2019 14:43   CT Code Stroke CTA Neck W/WO contrast  Result Date: 10/22/2019 CLINICAL DATA:  Code stroke for aphasia. EXAM: CT ANGIOGRAPHY HEAD AND NECK TECHNIQUE: Multidetector CT imaging of the head and neck was performed using the standard protocol during bolus administration of intravenous contrast. Multiplanar CT image reconstructions and MIPs were obtained to evaluate the vascular anatomy. Carotid stenosis measurements (when applicable) are obtained utilizing NASCET criteria, using the distal internal carotid diameter as the denominator. CONTRAST:  42mL OMNIPAQUE IOHEXOL 350 MG/ML SOLN COMPARISON:  Noncontrast head CT earlier today FINDINGS: CTA NECK FINDINGS Aortic arch: Atheromatous plaque. No acute finding. Three vessel branching. Right carotid system: Calcified plaque at the bifurcation without stenosis or ulceration. Mild undulation of the ICA lumen. Negative for dissection. Left carotid system: Mild calcified plaque at the bifurcation. No stenosis,  ulceration, or beading. Vertebral arteries: No proximal subclavian stenosis. Left vertebral artery dominance. The vertebral arteries are smooth and widely patent. Skeleton: Negative Other neck: No evidence of mass or inflammation. Upper chest: Negative Review of the MIP images confirms the above findings CTA HEAD FINDINGS Anterior circulation: Calcified plaque on the carotid siphons without stenosis or major branch occlusion. Negative for aneurysm or beading. Posterior circulation: Left vertebral artery dominance. The vertebral and basilar arteries are widely patent. Arterial elongation without aneurysm. No branch occlusion or proximal stenosis Venous sinuses: Patent as permitted by contrast timing Anatomic variants: As above Review of the MIP images confirms the above findings IMPRESSION: 1. No emergent finding. 2. Undulating appearance of the right cervical ICA, possible mild FMD. 3. Atherosclerosis without proximal stenosis, mild for age. Electronically Signed   By: Marnee Spring M.D.   On: 10/22/2019 14:43   CT HEAD CODE STROKE WO CONTRAST  Result Date: 10/22/2019 CLINICAL DATA:  Code stroke. Limited stroke symptoms. Aphasia at 9 a.m. today EXAM: CT HEAD WITHOUT CONTRAST TECHNIQUE: Contiguous axial images were obtained from the base of the skull through the vertex without intravenous contrast. COMPARISON:  None. FINDINGS: Brain: No evidence of acute infarction, hemorrhage, hydrocephalus, extra-axial collection or mass lesion/mass effect. Age normal generalized volume loss and white matter low-density. Vascular: Tortuous intracranial vessels. Atheromatous calcification. No convincing hyperdensity. Skull: Normal. Negative for fracture or focal lesion. Sinuses/Orbits: No acute finding.  Bilateral cataract resection Other: These results were communicated to Dr. Amada Jupiter at 2:01 pmon 8/21/2021by text page via the [facility] [place] messaging system. ASPECTS [facility] Stroke Program Early CT Score) - Ganglionic level  infarction (caudate, lentiform nuclei, internal capsule, insula, M1-M3 cortex): 3 - Supraganglionic infarction (M4-M6 cortex): 7 Total score (0-10 with 10 being normal): 10 IMPRESSION: No acute finding. Age congruent senescent findings. Electronically Signed   By: Marnee Spring M.D.   On: 10/22/2019 14:02     Assessment/Plan Present on Admission: . Word finding difficulty . CAD (coronary artery disease) . Chronic diastolic CHF (congestive heart failure) (HCC) .  Essential hypertension . Paroxysmal atrial fibrillation (HCC)  Active Problems:   Paroxysmal atrial fibrillation (HCC)   CAD (coronary artery disease)   Chronic diastolic CHF (congestive heart failure) (HCC)   Essential hypertension   Ventricular tachycardia (HCC)   Word finding difficulty    <principal problem not specified> Word finding difficulty.  Concern for either TIA versus stroke.  Seems like symptoms have been ongoing for several days, witnessed by family this morning as well as by providers in the ED.  No other focal deficits on examination.  No acute findings on CTA head and neck -Appreciate neurology consultation, will await further recommendations -Pending MRI brain -TTE ordered -A1c, lipid panel -ST, OT, PT -Monitor on telemetry   Hypertension.  SBP in the 170s.  Allow for permissive hypertension  Chronic diastolic CHF.  Last TTE 06/20/2019 with preserved EF, and grade 1 diastolic dysfunction.  Per patient (and confirmed in chart review) Lasix regimen was increased from 3 times a week dosing to to daily dosing.  No obvious signs of volume overload or peripheral edema on exam, no orthopnea, no shortness of breath -Closely monitor -Daily weights, strict I's and O's -Repeat TTE for stroke work-up -Hold off on further Lasix  Paroxysmal atrial fibrillation, on long-term Eliquis (diagnosed February 2020) CAD status post PCI (STEMI, December 2019, status post DES x2 to right CEA complicated by right ventricular  failure/CHB). History of wide-complex cardia (in the setting of hyperkalemia and AKI requiring cardioversion) Currently in normal sinus rhythm.  Patient reports being on Plavix at home since her stent in December 2019, however on chart review from recent cardiology visits patient is on Eliquis  on Eliquis.  CHA2DS2-VASc score at least of 4.  Denies any chest pain or palpitations.   -Monitor on telemetry -On Eliquis at home, anticipate will need to make some changes anticoagulation given concern for other stroke versus TIA -Followed by Dr. Elease Hashimoto  Hyperlipidemia -On atorvastatin Lipitor 40 mg per chart review, will await med rec -Repeat lipid panel      DVT prophylaxis: SCDs for now  Code Status: Full code, discussed on day of admission  Family Communication: Son updated at bedside Disposition Plan: Stable, currently undergoing work-up for stroke versus TIA, pending neurology recommendations  Consults called: Neurology  Admission status: Admitted as observation to telemetry unit while undergoing work-up for stroke versus TIA      Laverna Peace MD [facility]  Pager [phone]  If 7PM-7AM, please contact night-coverage [email or web address] Password [email or web address]  10/22/2019, 4:10 PM

## 2019-10-22 NOTE — ED Notes (Signed)
Patient transported to MRI 

## 2019-10-22 NOTE — ED Triage Notes (Signed)
Pt from home and was last seen normal with Phone call to son 0930 Friday. Pt had text son and the wording was confusing.  Later Pt was talking clearly to son. Today Pt also sent a confusing text this morning to her son. The son had a next door neighbor go to PT home to check on her. Neighbor reported Pt called her cat a dog. EMS transported Pt to ED.  BP 180/80, HR 94, Pt's son Freida Busman [phone]. Son lives in [place] ,[place]

## 2019-10-22 NOTE — Consult Note (Signed)
Neurology Consultation Reason for Consult: Speech difficulty Referring Physician: Tegeler, C  CC: Speech difficulty  History is obtained from: Patient  HPI: Charlene Dickerson is a 86 y.o. female with a history of essential tremor, hypertension who presents with difficulty with her speech.  She states that she had an episode Thursday night which resolved.  She was speaking normally last night when she went to bed but when she first started talking to her family around 930 this morning, she was already having some difficulty.  She did not notice any weakness or numbness, just that she was having a lot of difficulty with her speech.  Her neighbor came over and evaluated her and found her to be aphasic.  On arrival to the emergency department, her speech is somewhat improved, but she was still having a lot of trouble with repetition.   LKW: 8/20 prior to bed tpa given?: no, outside window  ROS: A 14 point ROS was performed and is negative except as noted in the HPI.   Past Medical History:  Diagnosis Date  . Diverticulosis of colon   . Essential tremor   . GERD (gastroesophageal reflux disease)    occasional (no meds)  . History of basal cell carcinoma excision 2012  . Hypertension   . Osteoporosis   . PONV (postoperative nausea and vomiting)   . Rectocele    symptomatic     Family History  Problem Relation Age of Onset  . Cancer Sister        Breast  . Heart disease Sister   . Breast cancer Sister   . Heart disease Father   . Cancer Daughter        breast  . Breast cancer Daughter   . Cancer Maternal Aunt        breast  . Breast cancer Maternal Aunt   . Cancer Maternal Grandmother        breast  . Breast cancer Maternal Grandmother      Social History:  reports that she quit smoking about 58 years ago. She has never used smokeless tobacco. She reports that she does not drink alcohol and does not use drugs.   Exam: Current vital signs: BP (!) 149/77   Pulse 67   Temp  98.4 F (36.9 C) (Oral)   Resp (!) 22   Ht 5\' 2"  (1.575 m)   Wt 64.9 kg   SpO2 98%   BMI 26.16 kg/m  Vital signs in last 24 hours: Temp:  [98.4 F (36.9 C)] 98.4 F (36.9 C) (08/21 1423) Pulse Rate:  [67-80] 67 (08/21 1855) Resp:  [15-22] 22 (08/21 1855) BP: (138-171)/(75-159) 149/77 (08/21 1850) SpO2:  [77 %-99 %] 98 % (08/21 1855) Weight:  [64.9 kg] 64.9 kg (08/21 1424)   Physical Exam  Constitutional: Appears well-developed and well-nourished.  Psych: Affect appropriate to situation Eyes: No scleral injection HENT: No OP obstrucion MSK: no joint deformities.  Cardiovascular: Normal rate and regular rhythm.  Respiratory: Effort normal, non-labored breathing GI: Soft.  No distension. There is no tenderness.  Skin: WDI  Neuro: Mental Status: Patient is awake, alert, oriented to person, place, month, year, and situation. Patient is able to give a clear and coherent history. She was able to name objects, but was completely unable to repeat. Cranial Nerves: II: Visual Fields are full. Pupils are equal, round, and reactive to light.   III,IV, VI: EOMI without ptosis or diploplia.  V: Facial sensation is symmetric to temperature VII: Facial  movement is symmetric.  VIII: hearing is intact to voice X: Uvula elevates symmetrically XI: Shoulder shrug is symmetric. XII: tongue is midline without atrophy or fasciculations.  Motor: Tone is normal. Bulk is normal. 5/5 strength was present in all four extremities.  Sensory: Sensation is symmetric to light touch and temperature in the arms and legs. Cerebellar: FNF and HKS are intact bilaterally   I have reviewed labs in epic and the results pertinent to this consultation are: CMP-unremarkable  I have reviewed the images obtained: CT head-unremarkable, MRI brain-negative  Impression: 86 year old female with waxing/waning aphasia of unclear etiology.  Certainly with a history of atrial fibrillation not on anticoagulation, TIA  is a significant consideration, but with multiple episodes and the duration of symptoms, negative MRI could argue against this.  I would favor getting an EEG as well as doing TIA work-up given that her symptoms have significantly improved.  Recommendations: - HgbA1c, fasting lipid panel - Prophylactic therapy-Antiplatelet med: Eliquis 5 mg twice daily - Risk factor modification - Telemetry monitoring - EEG -Neurology to follow   Ritta Slot, MD [facility] [phone]  If 7pm- 7am, please page neurology on call as listed in AMION.

## 2019-10-22 NOTE — ED Provider Notes (Signed)
[facility] EMERGENCY DEPARTMENT Provider Note   CSN: [id number] Arrival date & time: 10/22/19  1347     History Chief Complaint  Patient presents with  . Code Stroke    Charlene Dickerson is a 86 y.o. female.  The history is provided by the patient, medical records and the EMS personnel. No language interpreter was used.  Neurologic Problem This is a new problem. The current episode started 6 to 12 hours ago. The problem occurs constantly. The problem has not changed since onset.Pertinent negatives include no chest pain, no abdominal pain, no headaches and no shortness of breath. Nothing aggravates the symptoms. Nothing relieves the symptoms. She has tried nothing for the symptoms. The treatment provided no relief.       Past Medical History:  Diagnosis Date  . Diverticulosis of colon   . Essential tremor   . GERD (gastroesophageal reflux disease)    occasional (no meds)  . History of basal cell carcinoma excision 2012  . Hypertension   . Osteoporosis   . PONV (postoperative nausea and vomiting)   . Rectocele    symptomatic    Patient Active Problem List   Diagnosis Date Noted  . Ventricular tachycardia (HCC) 07/15/2019  . Malnutrition of moderate degree 06/21/2019  . AKI (acute kidney injury) (HCC) 06/20/2019  . Hyperkalemia 06/20/2019  . Metabolic acidosis 06/20/2019  . Uremia 06/20/2019  . Closed compression fracture of L4 lumbar vertebra, initial encounter (HCC) 06/20/2019  . CAD (coronary artery disease) 06/20/2019  . Dehydration 06/20/2019  . Chronic diastolic CHF (congestive heart failure) (HCC) 06/20/2019  . Essential hypertension 06/20/2019  . Low back pain 06/01/2019  . Paroxysmal atrial fibrillation (HCC)   . Persistent atrial fibrillation (HCC) 03/12/2018  . Shock circulatory (HCC)   . Hemodynamic instability   . Central venous catheter in place   . STEMI (ST elevation myocardial infarction) (HCC) 02/18/2018  . Acute myocardial  infarction (HCC) 02/18/2018  . Complete heart block (HCC)   . Rectocele 11/26/2017  . Cystocele with rectocele 11/26/2017  . Diffuse cystic mastopathy of breast 05/09/2011    Past Surgical History:  Procedure Laterality Date  . BREAST EXCISIONAL BIOPSY Left 1995  . BREAST EXCISIONAL BIOPSY Right 1988  . CARDIOVERSION N/A 04/16/2018   Procedure: CARDIOVERSION;  Surgeon: Parke Poisson, MD;  Location: [facility] ENDOSCOPY;  Service: Cardiovascular;  Laterality: N/A;  . CATARACT EXTRACTION W/ INTRAOCULAR LENS  IMPLANT, BILATERAL  right 09/ 2013;  left 10/ 2013  . COLONOSCOPY  last one 04/10/ 2012  . CORONARY STENT INTERVENTION N/A 02/18/2018   Procedure: CORONARY STENT INTERVENTION;  Surgeon: Runell Gess, MD;  Location: [facility] INVASIVE CV LAB;  Service: Cardiovascular;  Laterality: N/A;  . D & C HYSTEROSCOPY W/ RESECTION ENDOMETRIAL POLYP AND LESION  07-27-2007   dr Ambrose Mantle   . IR KYPHO LUMBAR INC FX REDUCE BONE BX UNI/BIL CANNULATION INC/IMAGING  07/11/2019  . LEFT HEART CATH AND CORONARY ANGIOGRAPHY N/A 02/18/2018   Procedure: LEFT HEART CATH AND CORONARY ANGIOGRAPHY;  Surgeon: Runell Gess, MD;  Location: [facility] INVASIVE CV LAB;  Service: Cardiovascular;  Laterality: N/A;  . RECTOCELE REPAIR N/A 11/26/2017   Procedure: POSTERIOR REPAIR (RECTOCELE);  Surgeon: Tracey Harries, MD;  Location: [facility];  Service: Gynecology;  Laterality: N/A;  OUTPT IN BED  . TEMPORARY PACEMAKER N/A 02/18/2018   Procedure: TEMPORARY PACEMAKER;  Surgeon: Runell Gess, MD;  Location: [facility] INVASIVE CV LAB;  Service: Cardiovascular;  Laterality: N/A;  .  TUBAL LIGATION Bilateral yrs ago     OB History   No obstetric history on file.     Family History  Problem Relation Age of Onset  . Cancer Sister        Breast  . Heart disease Sister   . Breast cancer Sister   . Heart disease Father   . Cancer Daughter        breast  . Breast cancer Daughter   . Cancer Maternal Aunt         breast  . Breast cancer Maternal Aunt   . Cancer Maternal Grandmother        breast  . Breast cancer Maternal Grandmother     Social History   Tobacco Use  . Smoking status: Former Smoker    Quit date: 05/08/1961    Years since quitting: 58.4  . Smokeless tobacco: Never Used  Vaping Use  . Vaping Use: Never used  Substance Use Topics  . Alcohol use: No  . Drug use: Never    Home Medications Prior to Admission medications   Medication Sig Start Date End Date Taking? Authorizing Provider  acetaminophen (TYLENOL) 650 MG CR tablet Take 1,300 mg by mouth as needed for pain.    [provider]  ALPRAZolam Prudy Feeler) 0.25 MG tablet Take 0.5-1 tablets (0.125-0.25 mg total) by mouth 2 (two) times daily as needed for anxiety. 06/24/19   Rolly Salter, MD  apixaban (ELIQUIS) 5 MG TABS tablet TAKE 1 TABLET(5 MG) BY MOUTH TWICE DAILY 06/13/19   Nahser, Deloris Ping, MD  atorvastatin (LIPITOR) 40 MG tablet TAKE 1 TABLET(40 MG) BY MOUTH DAILY AT 6 PM 04/26/19   Nahser, Deloris Ping, MD  calcium citrate (CALCITRATE - DOSED IN MG ELEMENTAL CALCIUM) 950 MG tablet Take 200 mg of elemental calcium by mouth daily.    [provider]  docusate sodium (COLACE) 100 MG capsule Take 100 mg by mouth as needed for mild constipation.    [provider]  feeding supplement, ENSURE ENLIVE, (ENSURE ENLIVE) LIQD Take 237 mLs by mouth 2 (two) times daily between meals. 06/24/19   Rolly Salter, MD  furosemide (LASIX) 20 MG tablet Take 10 mg by mouth 3 (three) times a week. Monday, Wednesday, and Friday at lunch time    [provider]  latanoprost (XALATAN) 0.005 % ophthalmic solution Place 1 drop into both eyes at bedtime.  04/17/11   [provider]  loratadine (CLARITIN) 10 MG tablet Take 10 mg by mouth daily.    [provider]  Multiple Vitamin (MULTIVITAMIN WITH MINERALS) TABS tablet Take 1 tablet by mouth daily.    [provider]  nitroGLYCERIN (NITROSTAT)  0.4 MG SL tablet Place 1 tablet (0.4 mg total) under the tongue every 5 (five) minutes as needed for chest pain. 02/24/18 07/15/19  Robbie Lis M, PA-C  pantoprazole (PROTONIX) 40 MG tablet TAKE 1 TABLET(40 MG) BY MOUTH AT BEDTIME 03/28/19   Robbie Lis M, PA-C  propranolol (INDERAL) 10 MG tablet Take 10 mg by mouth 2 (two) times daily.     [provider]  Vitamin D, Ergocalciferol, (DRISDOL) 1.25 MG (50000 UT) CAPS capsule Take 50,000 Units by mouth every Wednesday.     [provider]    Allergies    Clindamycin/lincomycin  Review of Systems   Review of Systems  Constitutional: Negative for chills, fatigue and fever.  HENT: Negative for congestion.   Eyes: Negative for visual disturbance.  Respiratory: Negative for  chest tightness, shortness of breath and wheezing.   Cardiovascular: Negative for chest pain.  Gastrointestinal: Negative for abdominal pain, constipation, diarrhea, nausea and rectal pain.  Genitourinary: Negative for dysuria.  Musculoskeletal: Negative for back pain and neck pain.  Neurological: Positive for speech difficulty. Negative for dizziness, weakness, light-headedness, numbness and headaches.  Hematological: Does not bruise/bleed easily.  Psychiatric/Behavioral: Negative for agitation.  All other systems reviewed and are negative.   Physical Exam Updated Vital Signs There were no vitals taken for this visit.  Physical Exam Vitals and nursing note reviewed.  Constitutional:      General: She is not in acute distress.    Appearance: She is well-developed. She is not ill-appearing, toxic-appearing or diaphoretic.  HENT:     Head: Normocephalic and atraumatic.     Right Ear: External ear normal.     Left Ear: External ear normal.     Nose: Nose normal. No rhinorrhea.     Mouth/Throat:     Mouth: Mucous membranes are moist.     Pharynx: No oropharyngeal exudate or posterior oropharyngeal erythema.  Eyes:      Conjunctiva/sclera: Conjunctivae normal.     Pupils: Pupils are equal, round, and reactive to light.  Cardiovascular:     Rate and Rhythm: Normal rate.     Pulses: Normal pulses.     Heart sounds: No murmur heard.   Pulmonary:     Effort: No respiratory distress.     Breath sounds: No stridor. No wheezing, rhonchi or rales.  Chest:     Chest wall: No tenderness.  Abdominal:     General: Abdomen is flat. There is no distension.     Tenderness: There is no abdominal tenderness. There is no right CVA tenderness, left CVA tenderness or rebound.  Musculoskeletal:        General: No tenderness.     Cervical back: Normal range of motion and neck supple.  Skin:    General: Skin is warm.     Coloration: Skin is not pale.     Findings: No erythema or rash.  Neurological:     Mental Status: She is alert and oriented to person, place, and time.     GCS: GCS eye subscore is 4. GCS verbal subscore is 5. GCS motor subscore is 6.     Cranial Nerves: No dysarthria or facial asymmetry.     Sensory: No sensory deficit.     Motor: No weakness, tremor, abnormal muscle tone or seizure activity.     Coordination: Coordination normal. Finger-Nose-Finger Test normal.     Deep Tendon Reflexes: Reflexes are normal and symmetric.     Comments: Patient had difficulty repeating phrases with some word salad.  Normal object identification.  No other focal neurologic deficits on my exam.  Psychiatric:        Mood and Affect: Mood normal.     ED Results / Procedures / Treatments   Labs (all labs ordered are listed, but only abnormal results are displayed) Labs Reviewed  I-STAT CHEM 8, ED - Abnormal; Notable for the following components:      Result Value   Calcium, Ion 1.09 (*)    All other components within normal limits  SARS CORONAVIRUS 2 BY RT PCR (HOSPITAL ORDER, PERFORMED IN [place] [facility])  PROTIME-INR  APTT  CBC  DIFFERENTIAL  COMPREHENSIVE METABOLIC PANEL  AMMONIA  LIPID PANEL   HEMOGLOBIN A1C  CBG MONITORING, ED    EKG EKG Interpretation  Date/Time:  Saturday October 22 2019 14:23:28 EDT Ventricular Rate:  80 PR Interval:    QRS Duration: 90 QT Interval:  383 QTC Calculation: 442 R Axis:   25 Text Interpretation: Sinus rhythm Probable anterior infarct, old When compared to prior, less t wave inversion in lead 3. No STEMI Confirmed by Theda Belfast ([id number]) on 10/22/2019 2:24:59 PM   Radiology CT Code Stroke CTA Head W/WO contrast  Result Date: 10/22/2019 CLINICAL DATA:  Code stroke for aphasia. EXAM: CT ANGIOGRAPHY HEAD AND NECK TECHNIQUE: Multidetector CT imaging of the head and neck was performed using the standard protocol during bolus administration of intravenous contrast. Multiplanar CT image reconstructions and MIPs were obtained to evaluate the vascular anatomy. Carotid stenosis measurements (when applicable) are obtained utilizing NASCET criteria, using the distal internal carotid diameter as the denominator. CONTRAST:  60mL OMNIPAQUE IOHEXOL 350 MG/ML SOLN COMPARISON:  Noncontrast head CT earlier today FINDINGS: CTA NECK FINDINGS Aortic arch: Atheromatous plaque. No acute finding. Three vessel branching. Right carotid system: Calcified plaque at the bifurcation without stenosis or ulceration. Mild undulation of the ICA lumen. Negative for dissection. Left carotid system: Mild calcified plaque at the bifurcation. No stenosis, ulceration, or beading. Vertebral arteries: No proximal subclavian stenosis. Left vertebral artery dominance. The vertebral arteries are smooth and widely patent. Skeleton: Negative Other neck: No evidence of mass or inflammation. Upper chest: Negative Review of the MIP images confirms the above findings CTA HEAD FINDINGS Anterior circulation: Calcified plaque on the carotid siphons without stenosis or major branch occlusion. Negative for aneurysm or beading. Posterior circulation: Left vertebral artery dominance. The vertebral and basilar  arteries are widely patent. Arterial elongation without aneurysm. No branch occlusion or proximal stenosis Venous sinuses: Patent as permitted by contrast timing Anatomic variants: As above Review of the MIP images confirms the above findings IMPRESSION: 1. No emergent finding. 2. Undulating appearance of the right cervical ICA, possible mild FMD. 3. Atherosclerosis without proximal stenosis, mild for age. Electronically Signed   By: Marnee Spring M.D.   On: 10/22/2019 14:43   CT Code Stroke CTA Neck W/WO contrast  Result Date: 10/22/2019 CLINICAL DATA:  Code stroke for aphasia. EXAM: CT ANGIOGRAPHY HEAD AND NECK TECHNIQUE: Multidetector CT imaging of the head and neck was performed using the standard protocol during bolus administration of intravenous contrast. Multiplanar CT image reconstructions and MIPs were obtained to evaluate the vascular anatomy. Carotid stenosis measurements (when applicable) are obtained utilizing NASCET criteria, using the distal internal carotid diameter as the denominator. CONTRAST:  60mL OMNIPAQUE IOHEXOL 350 MG/ML SOLN COMPARISON:  Noncontrast head CT earlier today FINDINGS: CTA NECK FINDINGS Aortic arch: Atheromatous plaque. No acute finding. Three vessel branching. Right carotid system: Calcified plaque at the bifurcation without stenosis or ulceration. Mild undulation of the ICA lumen. Negative for dissection. Left carotid system: Mild calcified plaque at the bifurcation. No stenosis, ulceration, or beading. Vertebral arteries: No proximal subclavian stenosis. Left vertebral artery dominance. The vertebral arteries are smooth and widely patent. Skeleton: Negative Other neck: No evidence of mass or inflammation. Upper chest: Negative Review of the MIP images confirms the above findings CTA HEAD FINDINGS Anterior circulation: Calcified plaque on the carotid siphons without stenosis or major branch occlusion. Negative for aneurysm or beading. Posterior circulation: Left  vertebral artery dominance. The vertebral and basilar arteries are widely patent. Arterial elongation without aneurysm. No branch occlusion or proximal stenosis Venous sinuses: Patent as permitted by contrast timing Anatomic variants: As above Review of the MIP images confirms the above  findings IMPRESSION: 1. No emergent finding. 2. Undulating appearance of the right cervical ICA, possible mild FMD. 3. Atherosclerosis without proximal stenosis, mild for age. Electronically Signed   By: Marnee Spring M.D.   On: 10/22/2019 14:43   CT HEAD CODE STROKE WO CONTRAST  Result Date: 10/22/2019 CLINICAL DATA:  Code stroke. Limited stroke symptoms. Aphasia at 9 a.m. today EXAM: CT HEAD WITHOUT CONTRAST TECHNIQUE: Contiguous axial images were obtained from the base of the skull through the vertex without intravenous contrast. COMPARISON:  None. FINDINGS: Brain: No evidence of acute infarction, hemorrhage, hydrocephalus, extra-axial collection or mass lesion/mass effect. Age normal generalized volume loss and white matter low-density. Vascular: Tortuous intracranial vessels. Atheromatous calcification. No convincing hyperdensity. Skull: Normal. Negative for fracture or focal lesion. Sinuses/Orbits: No acute finding.  Bilateral cataract resection Other: These results were communicated to Dr. Amada Jupiter at 2:01 pmon 8/21/2021by text page via the [facility] messaging system. ASPECTS [facility] Stroke Program Early CT Score) - Ganglionic level infarction (caudate, lentiform nuclei, internal capsule, insula, M1-M3 cortex): 3 - Supraganglionic infarction (M4-M6 cortex): 7 Total score (0-10 with 10 being normal): 10 IMPRESSION: No acute finding. Age congruent senescent findings. Electronically Signed   By: Marnee Spring M.D.   On: 10/22/2019 14:02    Procedures Procedures (including critical care time)  Medications Ordered in ED Medications  sodium chloride flush (NS) 0.9 % injection 3 mL (has no administration in time  range)   stroke: mapping our early stages of recovery book (has no administration in time range)  acetaminophen (TYLENOL) tablet 650 mg (has no administration in time range)    Or  acetaminophen (TYLENOL) 160 MG/5ML solution 650 mg (has no administration in time range)    Or  acetaminophen (TYLENOL) suppository 650 mg (has no administration in time range)  senna-docusate (Senokot-S) tablet 1 tablet (has no administration in time range)  iohexol (OMNIPAQUE) 350 MG/ML injection 60 mL (60 mLs Intravenous Contrast Given 10/22/19 1405)    ED Course  I have reviewed the triage vital signs and the nursing notes.  Pertinent labs & imaging results that were available during my care of the patient were reviewed by me and considered in my medical decision making (see chart for details).    MDM Rules/Calculators/A&P                          EVALEEN SANT is a 86 y.o. female with a past medical history significant for CAD, CHF, paroxysmal atrial fibrillation on Eliquis therapy, and prior complete heart block who presents as a code stroke.  According to EMS, patient sent a confusing word salad type text to family 2 days ago.  She then was speaking normally yesterday however this morning was having a difficult time with conversation and speaking and word finding.  They report she had not had any numbness, tingling, weakness, or vision changes.  She has been repetitive with questioning.  On arrival, airway was clear and she was taken to CT scanner.  Patient has been seen by neurology in the CT scanner.  Anticipate following up on their recommendations.  On my exam, lungs clear and chest and abdomen are nontender.  She is moving extremities.  Good pulses in extremities.  Anticipate following up on neurology recommendations.  CT imaging not show any emergent findings however neurology feels she likely had a stroke.  They recommended MRI and admission for further stroke work-up causing this aphasia that was  felt to  be a conduction aphasia.  Patient will be admitted for further management.   Final Clinical Impression(s) / ED Diagnoses Final diagnoses:  Aphasia    Rx / DC Orders ED Discharge Orders    None     Clinical Impression: 1. Aphasia     Disposition: Admit  This note was prepared with assistance of Dragon voice recognition software. Occasional wrong-word or sound-a-like substitutions may have occurred due to the inherent limitations of voice recognition software.     Tilak Oakley, Canary Brim, MD 10/22/19 [phone]

## 2019-10-23 ENCOUNTER — Observation Stay (HOSPITAL_COMMUNITY): Payer: Medicare Other

## 2019-10-23 DIAGNOSIS — R4701 Aphasia: Secondary | ICD-10-CM | POA: Diagnosis not present

## 2019-10-23 DIAGNOSIS — I5032 Chronic diastolic (congestive) heart failure: Secondary | ICD-10-CM

## 2019-10-23 DIAGNOSIS — I10 Essential (primary) hypertension: Secondary | ICD-10-CM

## 2019-10-23 DIAGNOSIS — I480 Paroxysmal atrial fibrillation: Secondary | ICD-10-CM | POA: Diagnosis not present

## 2019-10-23 DIAGNOSIS — I2510 Atherosclerotic heart disease of native coronary artery without angina pectoris: Secondary | ICD-10-CM | POA: Diagnosis not present

## 2019-10-23 LAB — CBC WITH DIFFERENTIAL/PLATELET
Abs Immature Granulocytes: 0.01 10*3/uL (ref 0.00–0.07)
Basophils Absolute: 0 10*3/uL (ref 0.0–0.1)
Basophils Relative: 0 %
Eosinophils Absolute: 0.1 10*3/uL (ref 0.0–0.5)
Eosinophils Relative: 2 %
HCT: 45.9 % (ref 36.0–46.0)
Hemoglobin: 14.7 g/dL (ref 12.0–15.0)
Immature Granulocytes: 0 %
Lymphocytes Relative: 20 %
Lymphs Abs: 1 10*3/uL (ref 0.7–4.0)
MCH: 31.9 pg (ref 26.0–34.0)
MCHC: 32 g/dL (ref 30.0–36.0)
MCV: 99.6 fL (ref 80.0–100.0)
Monocytes Absolute: 0.6 10*3/uL (ref 0.1–1.0)
Monocytes Relative: 12 %
Neutro Abs: 3.2 10*3/uL (ref 1.7–7.7)
Neutrophils Relative %: 66 %
Platelets: 184 10*3/uL (ref 150–400)
RBC: 4.61 MIL/uL (ref 3.87–5.11)
RDW: 12.3 % (ref 11.5–15.5)
WBC: 4.8 10*3/uL (ref 4.0–10.5)
nRBC: 0 % (ref 0.0–0.2)

## 2019-10-23 LAB — COMPREHENSIVE METABOLIC PANEL
ALT: 17 U/L (ref 0–44)
AST: 21 U/L (ref 15–41)
Albumin: 3.7 g/dL (ref 3.5–5.0)
Alkaline Phosphatase: 40 U/L (ref 38–126)
Anion gap: 11 (ref 5–15)
BUN: 13 mg/dL (ref 8–23)
CO2: 28 mmol/L (ref 22–32)
Calcium: 9.7 mg/dL (ref 8.9–10.3)
Chloride: 101 mmol/L (ref 98–111)
Creatinine, Ser: 0.82 mg/dL (ref 0.44–1.00)
GFR calc Af Amer: >60 mL/min (ref >60)
GFR calc non Af Amer: >60 mL/min (ref >60)
Glucose, Bld: 143 mg/dL — ABNORMAL HIGH (ref 70–99)
Potassium: 4 mmol/L (ref 3.5–5.1)
Sodium: 140 mmol/L (ref 135–145)
Total Bilirubin: 0.8 mg/dL (ref 0.3–1.2)
Total Protein: 6.9 g/dL (ref 6.5–8.1)

## 2019-10-23 LAB — URINALYSIS, ROUTINE W REFLEX MICROSCOPIC
Bacteria, UA: NONE SEEN
Bilirubin Urine: NEGATIVE
Glucose, UA: NEGATIVE mg/dL
Ketones, ur: NEGATIVE mg/dL
Nitrite: NEGATIVE
Protein, ur: NEGATIVE mg/dL
Specific Gravity, Urine: 1.019 (ref 1.005–1.030)
pH: 6 (ref 5.0–8.0)

## 2019-10-23 LAB — VITAMIN B12: Vitamin B-12: 189 pg/mL (ref 180–914)

## 2019-10-23 MED ORDER — FUROSEMIDE 20 MG PO TABS
10.0000 mg | ORAL_TABLET | Freq: Every morning | ORAL | Status: DC
  Administered 2019-10-23 – 2019-10-25 (×3): 10 mg via ORAL
  Filled 2019-10-23 (×3): qty 1

## 2019-10-23 MED ORDER — ALPRAZOLAM 0.5 MG PO TABS
0.5000 mg | ORAL_TABLET | Freq: Three times a day (TID) | ORAL | Status: DC | PRN
  Administered 2019-10-23 (×2): 0.5 mg via ORAL
  Filled 2019-10-23 (×2): qty 1

## 2019-10-23 MED ORDER — ATORVASTATIN CALCIUM 40 MG PO TABS
40.0000 mg | ORAL_TABLET | Freq: Every day | ORAL | Status: DC
  Administered 2019-10-23 – 2019-10-24 (×2): 40 mg via ORAL
  Filled 2019-10-23 (×2): qty 1

## 2019-10-23 MED ORDER — PROPRANOLOL HCL 10 MG PO TABS
10.0000 mg | ORAL_TABLET | Freq: Two times a day (BID) | ORAL | Status: DC
  Administered 2019-10-23 – 2019-10-25 (×5): 10 mg via ORAL
  Filled 2019-10-23 (×8): qty 1

## 2019-10-23 MED ORDER — PANTOPRAZOLE SODIUM 40 MG PO TBEC
40.0000 mg | DELAYED_RELEASE_TABLET | Freq: Every day | ORAL | Status: DC
  Administered 2019-10-23 – 2019-10-24 (×2): 40 mg via ORAL
  Filled 2019-10-23 (×2): qty 1

## 2019-10-23 MED ORDER — ALUM & MAG HYDROXIDE-SIMETH 200-200-20 MG/5ML PO SUSP
30.0000 mL | Freq: Four times a day (QID) | ORAL | Status: DC | PRN
  Administered 2019-10-23: 30 mL via ORAL
  Filled 2019-10-23: qty 30

## 2019-10-23 MED ORDER — APIXABAN 5 MG PO TABS
5.0000 mg | ORAL_TABLET | Freq: Two times a day (BID) | ORAL | Status: DC
  Administered 2019-10-23 – 2019-10-25 (×5): 5 mg via ORAL
  Filled 2019-10-23 (×5): qty 1

## 2019-10-23 NOTE — Evaluation (Signed)
Physical Therapy Evaluation Patient Details Name: Charlene Dickerson MRN: [id number] DOB: 06-12-1932 Today's Date: 10/23/2019   History of Present Illness  Pt is an 86 y.o. F presenting to the hospital with aphasia and a UTI. PMH of PAF, CAD s/p STEMI '19, ventricular tachycardia, HTN, and HLD.    Clinical Impression  Patient was laying in her hospital bed at the start of the physical therapy session. A&Ox3 and was able to answer questions about home set-up and PLOF. She portrayed some aphasia and confusion during the session and had some difficulties with focusing on tasks. Mild deficits in coordination and LE strength testing. Stated she was having some blurry vision, noted difficulty tracking my finger when assessing visual fields with a jump in right eye when moving right to left across visual field. Dizziness with pupillary accommodation. She was Mod I with bed mobility and min guard with sit <> stand and gait training. Pt ambulated in hallway with RW and required repeated verbal and tactile cueing to keep the RW close. Walked with trunk flexed and small step gait pattern. She was left sitting EOB with daughter present at the start of OT session. She would continue to benefit from skilled physical therapy to address impairments in strength, cognition, coordination, balance, aphasia, gait training, and stairs. Recommend f/u physical therapy services to SNF after d/c.     Follow Up Recommendations SNF;Supervision - Intermittent    Equipment Recommendations  Rolling walker with 5" wheels    Recommendations for Other Services       Precautions / Restrictions Precautions Precautions: Fall Restrictions Weight Bearing Restrictions: No      Mobility  Bed Mobility Overal bed mobility: Modified Independent                Transfers Overall transfer level: Needs assistance Equipment used: Rolling walker (2 wheeled) Transfers: Sit to/from Stand Sit to Stand: Min guard          General transfer comment: min guard for safety  Ambulation/Gait Ambulation/Gait assistance: Min guard Gait Distance (Feet): 200 Feet Assistive device: Rolling walker (2 wheeled) Gait Pattern/deviations: Steppage;Festinating;Trunk flexed Gait velocity: decreased   General Gait Details: Needed repeated cueing to keep RW close, took progressively smaller steps and became further away from RW. Trunk flexed. Easily distracted, took hand off RW at times  Stairs            Wheelchair Mobility    Modified Rankin (Stroke Patients Only)       Balance Overall balance assessment: Needs assistance Sitting-balance support: No upper extremity supported;Feet supported Sitting balance-Leahy Scale: Good     Standing balance support: Bilateral upper extremity supported Standing balance-Leahy Scale: Good Standing balance comment: good balance with use of RW                             Pertinent Vitals/Pain      Home Living Family/patient expects to be discharged to:: Private residence Living Arrangements: Alone Available Help at Discharge: Family;Available PRN/intermittently Type of Home: House Home Access: Stairs to enter Entrance Stairs-Rails: Right;Left Entrance Stairs-Number of Steps: 2 Home Layout: Two level;Able to live on main level with bedroom/bathroom Home Equipment: Dan Humphreys - 2 wheels;Cane - single point;Shower seat;Grab bars - tub/shower      Prior Function Level of Independence: Independent         Comments: Still drives, typically to church, grocery store, hair salon; all relatively close     Hand  Dominance        Extremity/Trunk Assessment   Upper Extremity Assessment Upper Extremity Assessment: Defer to OT evaluation    Lower Extremity Assessment Lower Extremity Assessment: Generalized weakness    Cervical / Trunk Assessment Cervical / Trunk Assessment: Normal  Communication   Communication: HOH  Cognition Arousal/Alertness:  Awake/alert Behavior During Therapy: WFL for tasks assessed/performed Overall Cognitive Status: Impaired/Different from baseline Area of Impairment: Memory;Attention;Safety/judgement;Awareness;Problem solving                   Current Attention Level: Alternating     Safety/Judgement: Decreased awareness of safety;Decreased awareness of deficits Awareness: Emergent Problem Solving: Slow processing;Difficulty sequencing;Requires verbal cues        General Comments      Exercises     Assessment/Plan    PT Assessment Patient needs continued PT services  PT Problem List Decreased strength;Decreased coordination;Decreased balance;Decreased safety awareness;Decreased mobility       PT Treatment Interventions Gait training;DME instruction;Therapeutic exercise;Balance training;Therapeutic activities;Stair training;Functional mobility training;Patient/family education    PT Goals (Current goals can be found in the Care Plan section)  Acute Rehab PT Goals Patient Stated Goal: to go home with her cat PT Goal Formulation: With patient Time For Goal Achievement: 11/06/19 Potential to Achieve Goals: Good    Frequency Min 2X/week   Barriers to discharge        Co-evaluation               AM-PAC PT "6 Clicks" Mobility  Outcome Measure Help needed turning from your back to your side while in a flat bed without using bedrails?: None Help needed moving from lying on your back to sitting on the side of a flat bed without using bedrails?: None Help needed moving to and from a bed to a chair (including a wheelchair)?: A Little Help needed standing up from a chair using your arms (e.g., wheelchair or bedside chair)?: A Little Help needed to walk in hospital room?: A Little Help needed climbing 3-5 steps with a railing? : A Lot 6 Click Score: 19    End of Session Equipment Utilized During Treatment: Gait belt Activity Tolerance: Patient tolerated treatment well Patient  left: in bed;with family/visitor present;Other (comment) (with OT) Nurse Communication: Mobility status PT Visit Diagnosis: Unsteadiness on feet (R26.81);Other abnormalities of gait and mobility (R26.89);Other symptoms and signs involving the nervous system (K27.062)    Time: 3762-8315 PT Time Calculation (min) (ACUTE ONLY): 33 min   Charges:   PT Evaluation $PT Eval Moderate Complexity: 1 Mod PT Treatments $Gait Training: 8-22 mins        Elisha Ponder, SPT, ATC

## 2019-10-23 NOTE — Progress Notes (Signed)
PROGRESS NOTE    COUNTESS BIEBEL  WUJ:[id number] DOB: 1933/02/20 DOA: 10/22/2019 PCP: Martha Clan, MD    Brief Narrative:  Charlene Dickerson is a 86 y.o. female with medical history significant for paroxysmal atrial fibrillation on Eliquis, CAD status post STEMI with DES x2 (01/2018), history of ventricular tachycardia, HTN, HLD who presented to the emergency department with several day history of word finding difficulties.  In the ED blood pressure 148/83 and given her word finding difficulty code stroke was initiated with neurology consultation.  TRH was consulted for admission and further evaluation.   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  Initial concern for TIA versus CVA.  Neurology was consulted and followed during the hospital course.  CT head with no acute intracranial findings.  MR brain with no acute or subacute infarct.  CT angiogram head/neck with no emergent findings but with undulating appearance right CEA.  Hemoglobin A1c 5.4, LDL 91, HDL 83.  Given the persistence of symptoms with negative MRI neurology believes that she has some neurodegenerative process causing impairments in her left temporal region causing her difficulty with language and math.  No further indication of additional studies to include EEG or echocardiogram. --B12 level pending. --Continue atorvastatin 40 mg p.o. daily --Continue Eliquis 5 mg p.o. twice daily --Pending PT/OT/SLP evaluation  Essential hypertension Chronic diastolic congestive heart failure TTE 06/20/2019 with preserved LVEF, grade 1 diastolic dysfunction. --Continue propranolol 10 mg p.o. twice daily  and furosemide 10 mg p.o. daily  Paroxysmal atrial fibrillation CAD status post PCI Follows with cardiology outpatient.  MI December 2019 status post DES x2 to right RCA. --Continue Eliquis 5 mg p.o. twice daily --Continue monitor on telemetry, currently in NSR --Outpatient follow-up with cardiology  Hyperlipidemia Total cholesterol 187, HDL 83, LDL 91, triglycerides 63. --Continue atorvastatin 40 mg p.o. daily   DVT prophylaxis: Eliquis Code Status: Full code Family Communication: Discussed with patient's son Freida Busman via telephone this morning  Disposition Plan:  Status is: Observation  The patient remains OBS appropriate and will d/c before 2 midnights.  Dispo: The patient is from: Home              Anticipated d/c is to: Home              Anticipated d/c date is: 1 day              Patient currently is not medically stable to d/c.   Consultants:   Neurology  Procedures:   None  Antimicrobials:   None   Subjective: Patient seen and examined bedside, resting comfortably. Does have some continued issues with word finding, but she feels this is improved.  Poor sleep overnight.  No other complaints or concerns at this time.  Denies headache, no visual changes, no chest pain, palpitations, no shortness of breath, no abdominal pain, no weakness, no fatigue, no paresthesias.  No acute events overnight per nurse staff.  Objective: Vitals:   10/23/19 0100 10/23/19 0300 10/23/19 0832 10/23/19 1222  BP: 117/64 (!) 129/92 137/80 130/88  Pulse: 72 71 90 89  Resp: Temp: 98.3 F (36.8 C) 98.3 F (36.8  C) 98.7 F (37.1 C) 98.1 F (36.7 C)  TempSrc: Oral Oral Oral Oral  SpO2: 97% 96% 97% 98%  Weight:      Height:        Intake/Output Summary (Last 24 hours) at 10/23/2019 1237 Last data filed at 10/23/2019 1115 Gross per 24 hour  Intake 240 ml  Output 400 ml  Net -160 ml   Filed Weights   10/22/19 1424 10/22/19 2104  Weight: 64.9 kg 64.9 kg     Examination:  General exam: Appears calm and comfortable  Respiratory system: Clear to auscultation. Respiratory effort normal. Cardiovascular system: S1 & S2 heard, RRR. No JVD, murmurs, rubs, gallops or clicks. No pedal edema. Gastrointestinal system: Abdomen is nondistended, soft and nontender. No organomegaly or masses felt. Normal bowel sounds heard. Central nervous system: Alert and oriented. No focal neurological deficits. Extremities: Symmetric 5 x 5 power. Skin: No rashes, lesions or ulcers Psychiatry: Judgement and insight appear normal. Mood & affect appropriate.     Data Reviewed: I have personally reviewed following labs and imaging studies  CBC: Recent Labs  Lab 10/22/19 1350 10/22/19 1355 10/23/19 0754  WBC 5.1  --  4.8  NEUTROABS 3.2  --  3.2  HGB 13.6 13.9 14.7  HCT 41.9 41.0 45.9  MCV 100.0  --  99.6  PLT 176  --  184   Basic Metabolic Panel: Recent Labs  Lab 10/22/19 1350 10/22/19 1355 10/23/19 0754  NA 136 137 140  K 4.2 4.0 4.0  CL 104 104 101  CO2 22  --  28  GLUCOSE 97 95 143*  BUN 16 17 13   CREATININE 0.77 0.70 0.82  CALCIUM 9.3  --  9.7   GFR: Estimated Creatinine Clearance: 44.3 mL/min (by C-G formula based on SCr of 0.82 mg/dL). Liver Function Tests: Recent Labs  Lab 10/22/19 1350 10/23/19 0754  AST 18 21  ALT 17 17  ALKPHOS 41 40  BILITOT 0.9 0.8  PROT 6.7 6.9  ALBUMIN 3.8 3.7   No results for input(s): LIPASE, AMYLASE in the last 168 hours. Recent Labs  Lab 10/22/19 1624  AMMONIA 29   Coagulation Profile: Recent Labs  Lab 10/22/19 1350  INR 1.1   Cardiac Enzymes: No results for input(s): CKTOTAL, CKMB, CKMBINDEX, TROPONINI in the last 168 hours. BNP (last 3 results) No results for input(s): PROBNP in the last 8760 hours. HbA1C: Recent Labs    10/22/19 1624  HGBA1C 5.4   CBG: No results for input(s): GLUCAP in the last 168 hours. Lipid Profile: Recent Labs    10/22/19 1624  CHOL 187  HDL 83   LDLCALC 91  TRIG 63  CHOLHDL 2.3   Thyroid Function Tests: No results for input(s): TSH, T4TOTAL, FREET4, T3FREE, THYROIDAB in the last 72 hours. Anemia Panel: No results for input(s): VITAMINB12, FOLATE, FERRITIN, TIBC, IRON, RETICCTPCT in the last 72 hours. Sepsis Labs: No results for input(s): PROCALCITON, LATICACIDVEN in the last 168 hours.  Recent Results (from the past 240 hour(s))  SARS Coronavirus 2 by RT PCR (hospital order, performed in [facility] hospital lab) Nasopharyngeal Nasopharyngeal Swab     Status: None   Collection Time: 10/22/19  6:05 PM   Specimen: Nasopharyngeal Swab  Result Value Ref Range Status   SARS Coronavirus 2 NEGATIVE NEGATIVE Final    Comment: (NOTE) SARS-CoV-2 target nucleic acids are NOT DETECTED.  The SARS-CoV-2 RNA is generally detectable in upper and lower respiratory specimens during the acute phase of infection. The  lowest concentration of SARS-CoV-2 viral copies this assay can detect is 250 copies / mL. A negative result does not preclude SARS-CoV-2 infection and should not be used as the sole basis for treatment or other patient management decisions.  A negative result may occur with improper specimen collection / handling, submission of specimen other than nasopharyngeal swab, presence of viral mutation(s) within the areas targeted by this assay, and inadequate number of viral copies (<250 copies / mL). A negative result must be combined with clinical observations, patient history, and epidemiological information.  Fact Sheet for Patients:   BoilerBrush.com.cy  Fact Sheet for Healthcare Providers: https://pope.com/  This test is not yet approved or  cleared by the [place] FDA and has been authorized for detection and/or diagnosis of SARS-CoV-2 by FDA under an Emergency Use Authorization (EUA).  This EUA will remain in effect (meaning this test can be used) for the duration of  the COVID-19 declaration under Section 564(b)(1) of the Act, 21 U.S.C. section 360bbb-3(b)(1), unless the authorization is terminated or revoked sooner.  Performed at [facility], [address] [place], [place] [address]          Radiology Studies: CT Code Stroke CTA Head W/WO contrast  Result Date: 10/22/2019 CLINICAL DATA:  Code stroke for aphasia. EXAM: CT ANGIOGRAPHY HEAD AND NECK TECHNIQUE: Multidetector CT imaging of the head and neck was performed using the standard protocol during bolus administration of intravenous contrast. Multiplanar CT image reconstructions and MIPs were obtained to evaluate the vascular anatomy. Carotid stenosis measurements (when applicable) are obtained utilizing NASCET criteria, using the distal internal carotid diameter as the denominator. CONTRAST:  77mL OMNIPAQUE IOHEXOL 350 MG/ML SOLN COMPARISON:  Noncontrast head CT earlier today FINDINGS: CTA NECK FINDINGS Aortic arch: Atheromatous plaque. No acute finding. Three vessel branching. Right carotid system: Calcified plaque at the bifurcation without stenosis or ulceration. Mild undulation of the ICA lumen. Negative for dissection. Left carotid system: Mild calcified plaque at the bifurcation. No stenosis, ulceration, or beading. Vertebral arteries: No proximal subclavian stenosis. Left vertebral artery dominance. The vertebral arteries are smooth and widely patent. Skeleton: Negative Other neck: No evidence of mass or inflammation. Upper chest: Negative Review of the MIP images confirms the above findings CTA HEAD FINDINGS Anterior circulation: Calcified plaque on the carotid siphons without stenosis or major branch occlusion. Negative for aneurysm or beading. Posterior circulation: Left vertebral artery dominance. The vertebral and basilar arteries are widely patent. Arterial elongation without aneurysm. No branch occlusion or proximal stenosis Venous sinuses: Patent as permitted by contrast timing  Anatomic variants: As above Review of the MIP images confirms the above findings IMPRESSION: 1. No emergent finding. 2. Undulating appearance of the right cervical ICA, possible mild FMD. 3. Atherosclerosis without proximal stenosis, mild for age. Electronically Signed   By: Marnee Spring M.D.   On: 10/22/2019 14:43   CT Code Stroke CTA Neck W/WO contrast  Result Date: 10/22/2019 CLINICAL DATA:  Code stroke for aphasia. EXAM: CT ANGIOGRAPHY HEAD AND NECK TECHNIQUE: Multidetector CT imaging of the head and neck was performed using the standard protocol during bolus administration of intravenous contrast. Multiplanar CT image reconstructions and MIPs were obtained to evaluate the vascular anatomy. Carotid stenosis measurements (when applicable) are obtained utilizing NASCET criteria, using the distal internal carotid diameter as the denominator. CONTRAST:  67mL OMNIPAQUE IOHEXOL 350 MG/ML SOLN COMPARISON:  Noncontrast head CT earlier today FINDINGS: CTA NECK FINDINGS Aortic arch: Atheromatous plaque. No acute finding. Three vessel branching.  Right carotid system: Calcified plaque at the bifurcation without stenosis or ulceration. Mild undulation of the ICA lumen. Negative for dissection. Left carotid system: Mild calcified plaque at the bifurcation. No stenosis, ulceration, or beading. Vertebral arteries: No proximal subclavian stenosis. Left vertebral artery dominance. The vertebral arteries are smooth and widely patent. Skeleton: Negative Other neck: No evidence of mass or inflammation. Upper chest: Negative Review of the MIP images confirms the above findings CTA HEAD FINDINGS Anterior circulation: Calcified plaque on the carotid siphons without stenosis or major branch occlusion. Negative for aneurysm or beading. Posterior circulation: Left vertebral artery dominance. The vertebral and basilar arteries are widely patent. Arterial elongation without aneurysm. No branch occlusion or proximal stenosis Venous  sinuses: Patent as permitted by contrast timing Anatomic variants: As above Review of the MIP images confirms the above findings IMPRESSION: 1. No emergent finding. 2. Undulating appearance of the right cervical ICA, possible mild FMD. 3. Atherosclerosis without proximal stenosis, mild for age. Electronically Signed   By: Marnee Spring M.D.   On: 10/22/2019 14:43   MR BRAIN WO CONTRAST  Result Date: 10/22/2019 CLINICAL DATA:  Aphasia. EXAM: MRI HEAD WITHOUT CONTRAST TECHNIQUE: Multiplanar, multiecho pulse sequences of the brain and surrounding structures were obtained without intravenous contrast. COMPARISON:  CT studies same day FINDINGS: Brain: Diffusion imaging does not show any acute or subacute infarction. No focal abnormality affects the brainstem or cerebellum. Cerebral hemispheres show generalized age related atrophy with mild chronic small-vessel ischemic changes of the white matter. No cortical or large vessel territory infarction. No mass lesion, hemorrhage, hydrocephalus or extra-axial collection. Vascular: Major vessels at the base of the brain show flow. Skull and upper cervical spine: Negative Sinuses/Orbits: Clear/normal.  Previous lens implants. Other: None IMPRESSION: No acute or subacute infarction. Age related volume loss. Mild chronic small-vessel change of the cerebral hemispheric white matter. Electronically Signed   By: Paulina Fusi M.D.   On: 10/22/2019 17:16   CT HEAD CODE STROKE WO CONTRAST  Result Date: 10/22/2019 CLINICAL DATA:  Code stroke. Limited stroke symptoms. Aphasia at 9 a.m. today EXAM: CT HEAD WITHOUT CONTRAST TECHNIQUE: Contiguous axial images were obtained from the base of the skull through the vertex without intravenous contrast. COMPARISON:  None. FINDINGS: Brain: No evidence of acute infarction, hemorrhage, hydrocephalus, extra-axial collection or mass lesion/mass effect. Age normal generalized volume loss and white matter low-density. Vascular: Tortuous  intracranial vessels. Atheromatous calcification. No convincing hyperdensity. Skull: Normal. Negative for fracture or focal lesion. Sinuses/Orbits: No acute finding.  Bilateral cataract resection Other: These results were communicated to Dr. Amada Jupiter at 2:01 pmon 8/21/2021by text page via the [facility] messaging system. ASPECTS [facility] Stroke Program Early CT Score) - Ganglionic level infarction (caudate, lentiform nuclei, internal capsule, insula, M1-M3 cortex): 3 - Supraganglionic infarction (M4-M6 cortex): 7 Total score (0-10 with 10 being normal): 10 IMPRESSION: No acute finding. Age congruent senescent findings. Electronically Signed   By: Marnee Spring M.D.   On: 10/22/2019 14:02        Scheduled Meds: .  stroke: mapping our early stages of recovery book   Does not apply Once  . apixaban  5 mg Oral BID  . atorvastatin  40 mg Oral q1800  . sodium chloride flush  3 mL Intravenous Once   Continuous Infusions:   LOS: 0 days    Time spent: 29 minutes spent on chart review, discussion with nursing staff, consultants, updating family and interview/physical exam; more than 50% of that time was spent in counseling  and/or coordination of care.    Alvira Philips [place], DO [facility] Available via Epic secure chat 7am-7pm After these hours, please refer to coverage provider listed on [email or web address] 10/23/2019, 12:37 PM

## 2019-10-23 NOTE — Evaluation (Signed)
Speech Language Pathology Evaluation Patient Details Name: Charlene Dickerson MRN: [id number] DOB: [birth year] Today's Date: 10/23/2019 Time: 1100-1130 SLP Time Calculation (min) (ACUTE ONLY): 30 min  Problem List:  Patient Active Problem List   Diagnosis Date Noted  . 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 Ventricular tachycardia (HCC) 07/15/2019  . Malnutrition of moderate degree 06/21/2019  . AKI (acute kidney injury) (HCC) 06/20/2019  . Hyperkalemia 06/20/2019  . Metabolic acidosis 06/20/2019  . Uremia 06/20/2019  . Closed compression fracture of L4 lumbar vertebra, initial encounter (HCC) 06/20/2019  . CAD (coronary artery disease) 06/20/2019  . Dehydration 06/20/2019  . Chronic diastolic CHF (congestive heart failure) (HCC) 06/20/2019  . Essential hypertension 06/20/2019  . Low back pain 06/01/2019  . Paroxysmal atrial fibrillation (HCC)   . Persistent atrial fibrillation (HCC) 03/12/2018  . Shock circulatory (HCC)   . Hemodynamic instability   . Central venous catheter in place   . STEMI (ST elevation myocardial infarction) (HCC) 02/18/2018  . Acute myocardial infarction (HCC) 02/18/2018  . Complete heart block (HCC)   . Rectocele 11/26/2017  . Cystocele with rectocele 11/26/2017  . Diffuse cystic mastopathy of breast 05/09/2011   Past Medical History:  Past Medical History:  Diagnosis Date  . Diverticulosis of colon   . Essential tremor   . GERD (gastroesophageal reflux disease)    occasional (no meds)  . History of basal cell carcinoma excision 2012  . Hypertension   . Osteoporosis   . PONV (postoperative nausea and vomiting)   . Rectocele    symptomatic   Past Surgical History:  Past Surgical History:  Procedure Laterality Date  . BREAST EXCISIONAL BIOPSY Left 1995  . BREAST EXCISIONAL BIOPSY Right 1988  . CARDIOVERSION N/A 04/16/2018   Procedure: CARDIOVERSION;  Surgeon: Parke Poisson, MD;  Location: [facility] ENDOSCOPY;  Service: Cardiovascular;  Laterality: N/A;  .  CATARACT EXTRACTION W/ INTRAOCULAR LENS  IMPLANT, BILATERAL  right 09/ 2013;  left 10/ 2013  . COLONOSCOPY  last one 04/10/ 2012  . CORONARY STENT INTERVENTION N/A 02/18/2018   Procedure: CORONARY STENT INTERVENTION;  Surgeon: Runell Gess, MD;  Location: [facility] INVASIVE CV LAB;  Service: Cardiovascular;  Laterality: N/A;  . D & C HYSTEROSCOPY W/ RESECTION ENDOMETRIAL POLYP AND LESION  07-27-2007   dr Ambrose Mantle @[facility]   . IR KYPHO LUMBAR INC FX REDUCE BONE BX UNI/BIL CANNULATION INC/IMAGING  07/11/2019  . LEFT HEART CATH AND CORONARY ANGIOGRAPHY N/A 02/18/2018   Procedure: LEFT HEART CATH AND CORONARY ANGIOGRAPHY;  Surgeon: 02/20/2018, MD;  Location: [facility] INVASIVE CV LAB;  Service: Cardiovascular;  Laterality: N/A;  . RECTOCELE REPAIR N/A 11/26/2017   Procedure: POSTERIOR REPAIR (RECTOCELE);  Surgeon: 11/28/2017, MD;  Location: [facility];  Service: Gynecology;  Laterality: N/A;  OUTPT IN BED  . TEMPORARY PACEMAKER N/A 02/18/2018   Procedure: TEMPORARY PACEMAKER;  Surgeon: 02/20/2018, MD;  Location: [facility] INVASIVE CV LAB;  Service: Cardiovascular;  Laterality: N/A;  . TUBAL LIGATION Bilateral yrs ago   HPI:  Pt is an 86 y.o. female with medical history significant for paroxysmal atrial fibrillation on Eliquis, CAD status post STEMI with DES x2 (01/2018), history of ventricular tachycardia, HTN, HLD who presents on 10/22/2019 with several days of word finding difficulties.  MRI was negative for acute changes. Neurology suspects that pt has "some underlying neurodegenerative process causing impairment in her left temporal region consistent with her language and math difficulties."   Assessment / Plan / Recommendation Clinical Impression  Pt participated in speech/language evaluation with her daughter present. Pt's daughter reported that the pt has had cognitive-linguistic impairments characterized by deficits in memory and attention since she had a UTI in April. She indicated  that the pt was receiving SLP services to target these areas but that she has since been discharged. Pt reported that she has been unintentionally using "rhyming words" when naming items and in conversation. She cited the example of her saying "peep" instead of "keep". She presented with cognitive-linguistic impairments in the areas of memory, attention, problem solving and mental flexibility. She exhibited difficulty with auditory comprehension, but the impact of her impaired attention on these tasks is strongly considered. Phonemic paraphasias were noted during conversation as well as during sentence repetition tasks. It is recommended that pt receive SLP services at next venue of care if her symptoms do not resolve after treatment of acute illness. Further acute skilled SLP services are not clinically indicated at this time. Pt, and her daughter were educated regarding results and recommendations; both parties verbalized understanding as well as agreement with plan of care.    SLP Assessment  SLP Recommendation/Assessment: All further Speech Lanaguage Pathology  needs can be addressed in the next venue of care SLP Visit Diagnosis: Cognitive communication deficit (R41.841)    Follow Up Recommendations  None    Frequency and Duration           SLP Evaluation Cognition  Overall Cognitive Status: Impaired/Different from baseline Arousal/Alertness: Awake/alert Orientation Level: Oriented X4 Attention: Focused;Sustained Focused Attention: Impaired Focused Attention Impairment: Verbal basic Sustained Attention: Impaired Sustained Attention Impairment: Verbal basic Memory: Impaired Memory Impairment: Storage deficit;Retrieval deficit;Decreased recall of new information (Immediate: 2/3; delayed: 0/3; with cues: 3/3) Awareness: Impaired Awareness Impairment: Emergent impairment Problem Solving: Impaired Problem Solving Impairment: Verbal complex Executive Function:  Reasoning;Sequencing Reasoning: Impaired Reasoning Impairment: Verbal complex       Comprehension  Auditory Comprehension Overall Auditory Comprehension: Appears within functional limits for tasks assessed Yes/No Questions: Within Functional Limits Commands: Impaired Two Step Basic Commands:  (2/4) Multistep Basic Commands:  (0/3) Conversation: Simple Interfering Components: Hearing;Processing speed;Working memory EffectiveTechniques: Repetition;Slowed speech;Extra processing time;Increased volume    Expression Expression Primary Mode of Expression: Verbal Verbal Expression Overall Verbal Expression: Impaired Initiation: No impairment Automatic Speech: Counting;Day of week;Month of year (WFL) Level of Generative/Spontaneous Verbalization: Sentence;Conversation Repetition: Impaired Level of Impairment: Sentence level (3/5) Naming: No impairment Responsive:  (5/5) Confrontation: Within functional limits (10/10) Verbal Errors: Phonemic paraphasias Pragmatics: No impairment   Oral / Motor  Oral Motor/Sensory Function Overall Oral Motor/Sensory Function: Within functional limits Motor Speech Overall Motor Speech: Appears within functional limits for tasks assessed Respiration: Within functional limits Phonation: Normal Resonance: Within functional limits Articulation: Within functional limitis Intelligibility: Intelligible Motor Planning: Witnin functional limits Motor Speech Errors: Not applicable   Dela Sweeny I. Vear Clock, MS, CCC-SLP Acute Rehabilitation Services Office number [phone] Pager [phone]                    Scheryl Marten 10/23/2019, 2:49 PM

## 2019-10-23 NOTE — Evaluation (Signed)
Occupational Therapy Evaluation Patient Details Name: Charlene Dickerson MRN: [id number] DOB: [birth year] Today's Date: 10/23/2019    History of Present Illness Pt is an 86 y.o. F presenting to the hospital with aphasia and a UTI.  MRI of brain was negative for acute infarct.  neurology suspects she has an underlying neurodegenerative process causing impairments in her Lt temporal region. PMH of PAF, CAD s/p STEMI '19, ventricular tachycardia, HTN, and HLD   Clinical Impression   Pt admitted with above. She demonstrates the below listed deficits and will benefit from continued OT to maximize safety and independence with BADLs.  Pt presents to OT with impaired cognition including attention and memory.  She also reports seeing people who are not present as well as other objects and hearing music playing.  She currently requires min guard assist to min A for ADLs.  She lives alone and was independent PTA.  Anticipate she will require 24 hour supervision at discharge and therefore, SNF level rehab is recommended       Follow Up Recommendations  SNF;Supervision/Assistance - 24 hour    Equipment Recommendations  None recommended by OT    Recommendations for Other Services       Precautions / Restrictions Precautions Precautions: Fall Restrictions Weight Bearing Restrictions: No      Mobility Bed Mobility Overal bed mobility: Modified Independent                Transfers Overall transfer level: Needs assistance Equipment used: Rolling walker (2 wheeled) Transfers: Sit to/from [employer] Sit to Stand: Min guard Stand pivot transfers: Min assist       General transfer comment: min guard for safety    Balance Overall balance assessment: Needs assistance Sitting-balance support: No upper extremity supported;Feet supported Sitting balance-Leahy Scale: Good     Standing balance support: No upper extremity supported Standing balance-Leahy Scale: Fair Standing  balance comment: good balance with use of RW                           ADL either performed or assessed with clinical judgement   ADL Overall ADL's : Needs assistance/impaired Eating/Feeding: Independent   Grooming: Wash/dry hands;Wash/dry face;Oral care;Brushing hair;Supervision/safety;Standing   Upper Body Bathing: Minimal assistance;Sitting Upper Body Bathing Details (indicate cue type and reason): assist for thoroughness due to distractability  Lower Body Bathing: Minimal assistance;Sit to/from stand Lower Body Bathing Details (indicate cue type and reason): assist for thoroughness due to distractability  Upper Body Dressing : Supervision/safety;Sitting   Lower Body Dressing: Min guard;Sit to/from stand   Toilet Transfer: Minimal assistance;Ambulation;Comfort height toilet;Grab bars;RW   Toileting- Clothing Manipulation and Hygiene: Minimal assistance;Sit to/from stand       Functional mobility during ADLs: Minimal assistance;Rolling walker General ADL Comments: pt requires assist to stay on task      Vision Baseline Vision/History: Wears glasses Wears Glasses: At all times Patient Visual Report: No change from baseline Vision Assessment?: No apparent visual deficits Additional Comments: grossly assessed      Perception Perception Perception Tested?: Yes Comments: Pt seeing people who are not there, seeing paint chips and sequins on her legs and on the floor and hearing music playing    Praxis Praxis Praxis tested?: Within functional limits    Pertinent Vitals/Pain Pain Assessment: No/denies pain     Hand Dominance Right   Extremity/Trunk Assessment Upper Extremity Assessment Upper Extremity Assessment: Overall WFL for tasks assessed   Lower Extremity  Assessment Lower Extremity Assessment: Defer to PT evaluation   Cervical / Trunk Assessment Cervical / Trunk Assessment: Normal   Communication Communication Communication: HOH   Cognition  Arousal/Alertness: Awake/alert Behavior During Therapy: Anxious;Restless Overall Cognitive Status: Impaired/Different from baseline Area of Impairment: Attention;Memory;Following commands;Safety/judgement;Problem solving                   Current Attention Level: Sustained Memory: Decreased short-term memory Following Commands: Follows one step commands consistently;Follows multi-step commands inconsistently Safety/Judgement: Decreased awareness of safety Awareness: Emergent Problem Solving: Difficulty sequencing;Requires verbal cues General Comments: Pt is very distractable requiring frequent redirection     General Comments  daughter present.  Provided them with adult coloring books, a deck of cards, and word searches.  also discussed use of music to reduce confusion and keep her occupied     Exercises     Shoulder Instructions      Home Living Family/patient expects to be discharged to:: Private residence Living Arrangements: Alone Available Help at Discharge: Family;Available PRN/intermittently Type of Home: House Home Access: Stairs to enter [employer] of Steps: 2 Entrance Stairs-Rails: Right;Left Home Layout: Two level;Able to live on main level with bedroom/bathroom Alternate Level Stairs-Number of Steps: flight Alternate Level Stairs-Rails: Can reach both Bathroom Shower/Tub: Producer, television/film/video: Standard     Home Equipment: Environmental consultant - 2 wheels;Cane - single point;Shower seat;Grab bars - tub/shower   Additional Comments: Most family lives Out of town or works and cannot provide 24hr support  Lives With: Alone    Prior Functioning/Environment Level of Independence: Independent        Comments: Still drives, typically to church, grocery store, Airline pilot; all relatively close        OT Problem List: Impaired balance (sitting and/or standing);Impaired vision/perception;Decreased cognition;Decreased safety awareness;Decreased  knowledge of precautions      OT Treatment/Interventions: Self-care/ADL training;DME and/or AE instruction;Therapeutic activities;Cognitive remediation/compensation;Visual/perceptual remediation/compensation;Patient/family education;Balance training    OT Goals(Current goals can be found in the care plan section) Acute Rehab OT Goals Patient Stated Goal: to go home to her cat OT Goal Formulation: With patient/family Time For Goal Achievement: 10/30/19 Potential to Achieve Goals: Good ADL Goals Pt Will Perform Grooming: with supervision;standing Pt Will Perform Upper Body Dressing: with supervision;standing Pt Will Perform Lower Body Dressing: with supervision;sit to/from stand Pt Will Transfer to Toilet: with supervision;ambulating;regular height toilet Pt Will Perform Toileting - Clothing Manipulation and hygiene: with supervision;sit to/from stand Additional ADL Goal #1: pt will selectively attend to familiar ADL tasks x 5 mins with no cues.  OT Frequency: Min 2X/week   Barriers to D/C: Decreased caregiver support  lives alone        Co-evaluation              AM-PAC OT "6 Clicks" Daily Activity     Outcome Measure Help from another person eating meals?: None Help from another person taking care of personal grooming?: A Little Help from another person toileting, which includes using toliet, bedpan, or urinal?: A Little Help from another person bathing (including washing, rinsing, drying)?: A Little Help from another person to put on and taking off regular upper body clothing?: A Little Help from another person to put on and taking off regular lower body clothing?: A Little 6 Click Score: 19   End of Session Equipment Utilized During Treatment: Rolling walker Nurse Communication: Mobility status  Activity Tolerance: Patient tolerated treatment well Patient left: in bed;with call bell/phone within reach;with bed alarm  set;with family/visitor present  OT Visit Diagnosis:  Unsteadiness on feet (R26.81);Cognitive communication deficit (R41.841)                Time: 3500-9381 OT Time Calculation (min): 24 min Charges:  OT General Charges $OT Visit: 1 Visit OT Evaluation $OT Eval Moderate Complexity: 1 Mod OT Treatments $Therapeutic Activity: 8-22 mins  Eber Jones., OTR/L Acute Rehabilitation Services Pager [phone] Office [phone]   [name] M 10/23/2019, 6:08 PM

## 2019-10-23 NOTE — Progress Notes (Signed)
Charlene Dickerson continues to have mild difficulty with repetition this morning.  Her speech is relatively fluent otherwise.  She is able to spell world backwards, but unable to perform even simple subtraction (100-7) or give me the number of quarters in $2.75.  She is oriented, though she initially states "2019" but corrects her self rapidly to 2021.   Given the persistence of symptoms with negative MRI, I do not think stroke is at all likely.  With her already being on anticoagulation, I do not think an echo would likely change management.  Also, I think that seizure is very unlikely, and do not feel an EEG is indicated at this time given the alternative explanation for worsening symptoms and family report of cognitive problems at baseline.  I suspect that she has some underlying neurodegenerative process causing impairment in her left temporal region consistent with her language and math difficulties.  At this point, I would treat her UTI, and she will likely need outpatient follow-up for cognitive management.  Also, I do not see a B12 and I will check this.  Ritta Slot, MD [facility] [phone]  If 7pm- 7am, please page neurology on call as listed in AMION.

## 2019-10-24 ENCOUNTER — Other Ambulatory Visit: Payer: Medicare Other

## 2019-10-24 ENCOUNTER — Telehealth: Payer: Self-pay | Admitting: Cardiovascular Disease

## 2019-10-24 DIAGNOSIS — E785 Hyperlipidemia, unspecified: Secondary | ICD-10-CM | POA: Diagnosis present

## 2019-10-24 DIAGNOSIS — R488 Other symbolic dysfunctions: Secondary | ICD-10-CM | POA: Diagnosis present

## 2019-10-24 DIAGNOSIS — I252 Old myocardial infarction: Secondary | ICD-10-CM | POA: Diagnosis not present

## 2019-10-24 DIAGNOSIS — I480 Paroxysmal atrial fibrillation: Secondary | ICD-10-CM | POA: Diagnosis present

## 2019-10-24 DIAGNOSIS — Z955 Presence of coronary angioplasty implant and graft: Secondary | ICD-10-CM | POA: Diagnosis not present

## 2019-10-24 DIAGNOSIS — E538 Deficiency of other specified B group vitamins: Secondary | ICD-10-CM | POA: Diagnosis present

## 2019-10-24 DIAGNOSIS — K219 Gastro-esophageal reflux disease without esophagitis: Secondary | ICD-10-CM | POA: Diagnosis present

## 2019-10-24 DIAGNOSIS — I472 Ventricular tachycardia: Secondary | ICD-10-CM | POA: Diagnosis present

## 2019-10-24 DIAGNOSIS — Z888 Allergy status to other drugs, medicaments and biological substances status: Secondary | ICD-10-CM | POA: Diagnosis not present

## 2019-10-24 DIAGNOSIS — R4701 Aphasia: Secondary | ICD-10-CM | POA: Diagnosis present

## 2019-10-24 DIAGNOSIS — R4789 Other speech disturbances: Secondary | ICD-10-CM | POA: Diagnosis present

## 2019-10-24 DIAGNOSIS — Z7901 Long term (current) use of anticoagulants: Secondary | ICD-10-CM | POA: Diagnosis not present

## 2019-10-24 DIAGNOSIS — Z85828 Personal history of other malignant neoplasm of skin: Secondary | ICD-10-CM | POA: Diagnosis not present

## 2019-10-24 DIAGNOSIS — I2510 Atherosclerotic heart disease of native coronary artery without angina pectoris: Secondary | ICD-10-CM | POA: Diagnosis present

## 2019-10-24 DIAGNOSIS — G250 Essential tremor: Secondary | ICD-10-CM | POA: Diagnosis present

## 2019-10-24 DIAGNOSIS — R441 Visual hallucinations: Secondary | ICD-10-CM | POA: Diagnosis present

## 2019-10-24 DIAGNOSIS — I5032 Chronic diastolic (congestive) heart failure: Secondary | ICD-10-CM | POA: Diagnosis present

## 2019-10-24 DIAGNOSIS — Z881 Allergy status to other antibiotic agents status: Secondary | ICD-10-CM | POA: Diagnosis not present

## 2019-10-24 DIAGNOSIS — N816 Rectocele: Secondary | ICD-10-CM | POA: Diagnosis present

## 2019-10-24 DIAGNOSIS — R4189 Other symptoms and signs involving cognitive functions and awareness: Secondary | ICD-10-CM | POA: Diagnosis present

## 2019-10-24 DIAGNOSIS — I110 Hypertensive heart disease with heart failure: Secondary | ICD-10-CM | POA: Diagnosis present

## 2019-10-24 DIAGNOSIS — Z20822 Contact with and (suspected) exposure to covid-19: Secondary | ICD-10-CM | POA: Diagnosis present

## 2019-10-24 DIAGNOSIS — Z8679 Personal history of other diseases of the circulatory system: Secondary | ICD-10-CM | POA: Diagnosis not present

## 2019-10-24 DIAGNOSIS — M810 Age-related osteoporosis without current pathological fracture: Secondary | ICD-10-CM | POA: Diagnosis present

## 2019-10-24 LAB — COMPREHENSIVE METABOLIC PANEL
ALT: 16 U/L (ref 0–44)
AST: 19 U/L (ref 15–41)
Albumin: 3.5 g/dL (ref 3.5–5.0)
Alkaline Phosphatase: 36 U/L — ABNORMAL LOW (ref 38–126)
Anion gap: 11 (ref 5–15)
BUN: 14 mg/dL (ref 8–23)
CO2: 22 mmol/L (ref 22–32)
Calcium: 9.3 mg/dL (ref 8.9–10.3)
Chloride: 105 mmol/L (ref 98–111)
Creatinine, Ser: 0.93 mg/dL (ref 0.44–1.00)
GFR calc Af Amer: >60 mL/min (ref >60)
GFR calc non Af Amer: 56 mL/min — ABNORMAL LOW (ref >60)
Glucose, Bld: 143 mg/dL — ABNORMAL HIGH (ref 70–99)
Potassium: 4.1 mmol/L (ref 3.5–5.1)
Sodium: 138 mmol/L (ref 135–145)
Total Bilirubin: 0.8 mg/dL (ref 0.3–1.2)
Total Protein: 6.5 g/dL (ref 6.5–8.1)

## 2019-10-24 LAB — CBC WITH DIFFERENTIAL/PLATELET
Abs Immature Granulocytes: 0.02 10*3/uL (ref 0.00–0.07)
Basophils Absolute: 0 10*3/uL (ref 0.0–0.1)
Basophils Relative: 1 %
Eosinophils Absolute: 0.1 10*3/uL (ref 0.0–0.5)
Eosinophils Relative: 2 %
HCT: 44.7 % (ref 36.0–46.0)
Hemoglobin: 14.5 g/dL (ref 12.0–15.0)
Immature Granulocytes: 0 %
Lymphocytes Relative: 23 %
Lymphs Abs: 1.1 10*3/uL (ref 0.7–4.0)
MCH: 32.3 pg (ref 26.0–34.0)
MCHC: 32.4 g/dL (ref 30.0–36.0)
MCV: 99.6 fL (ref 80.0–100.0)
Monocytes Absolute: 0.6 10*3/uL (ref 0.1–1.0)
Monocytes Relative: 12 %
Neutro Abs: 2.9 10*3/uL (ref 1.7–7.7)
Neutrophils Relative %: 62 %
Platelets: 187 10*3/uL (ref 150–400)
RBC: 4.49 MIL/uL (ref 3.87–5.11)
RDW: 12.3 % (ref 11.5–15.5)
WBC: 4.6 10*3/uL (ref 4.0–10.5)
nRBC: 0 % (ref 0.0–0.2)

## 2019-10-24 LAB — URINE CULTURE: Culture: 30000 — AB

## 2019-10-24 LAB — SARS CORONAVIRUS 2 BY RT PCR (HOSPITAL ORDER, PERFORMED IN ~~LOC~~ HOSPITAL LAB): SARS Coronavirus 2: NEGATIVE

## 2019-10-24 MED ORDER — LATANOPROST 0.005 % OP SOLN
1.0000 [drp] | Freq: Every day | OPHTHALMIC | Status: DC
  Administered 2019-10-24: 1 [drp] via OPHTHALMIC
  Filled 2019-10-24: qty 2.5

## 2019-10-24 MED ORDER — VITAMIN B-12 1000 MCG PO TABS
1000.0000 ug | ORAL_TABLET | Freq: Every day | ORAL | Status: DC
  Administered 2019-10-24 – 2019-10-25 (×2): 1000 ug via ORAL
  Filled 2019-10-24 (×2): qty 1

## 2019-10-24 MED ORDER — QUETIAPINE FUMARATE 25 MG PO TABS
25.0000 mg | ORAL_TABLET | Freq: Every day | ORAL | Status: DC
  Administered 2019-10-24: 25 mg via ORAL
  Filled 2019-10-24: qty 1

## 2019-10-24 NOTE — NC FL2 (Signed)
[place] MEDICAID FL2 LEVEL OF CARE SCREENING TOOL     IDENTIFICATION  Patient Name: Charlene Dickerson Birthdate: 1932-12-12 Sex: female Admission Date (Current Location): 10/22/2019  [facility] and [place] Number:  Producer, television/film/video and Address:  [facility], [address], [place], [place] [address]      Provider Number: [id number]  Attending Physician Name and Address:  [place], Alvira Philips, DO  Relative Name and Phone Number:       Current Level of Care: Hospital Recommended Level of Care: Skilled Nursing Facility Prior Approval Number:    Date Approved/Denied:   PASRR Number: [id number]  Discharge Plan: SNF    Current Diagnoses: Patient Active Problem List   Diagnosis Date Noted  . 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 Ventricular tachycardia (HCC) 07/15/2019  . Malnutrition of moderate degree 06/21/2019  . AKI (acute kidney injury) (HCC) 06/20/2019  . Hyperkalemia 06/20/2019  . Metabolic acidosis 06/20/2019  . Uremia 06/20/2019  . Closed compression fracture of L4 lumbar vertebra, initial encounter (HCC) 06/20/2019  . CAD (coronary artery disease) 06/20/2019  . Dehydration 06/20/2019  . Chronic diastolic CHF (congestive heart failure) (HCC) 06/20/2019  . Essential hypertension 06/20/2019  . Low back pain 06/01/2019  . Paroxysmal atrial fibrillation (HCC)   . Persistent atrial fibrillation (HCC) 03/12/2018  . Shock circulatory (HCC)   . Hemodynamic instability   . Central venous catheter in place   . STEMI (ST elevation myocardial infarction) (HCC) 02/18/2018  . Acute myocardial infarction (HCC) 02/18/2018  . Complete heart block (HCC)   . Rectocele 11/26/2017  . Cystocele with rectocele 11/26/2017  . Diffuse cystic mastopathy of breast 05/09/2011    Orientation RESPIRATION BLADDER Height & Weight     Self, Time, Situation, Place  Normal   Weight: 143 lb (64.9 kg) Height:  5\' 2"  (157.5 cm)  BEHAVIORAL SYMPTOMS/MOOD NEUROLOGICAL BOWEL  NUTRITION STATUS        Diet (see discharge summary)  AMBULATORY STATUS COMMUNICATION OF NEEDS Skin   Limited Assist Verbally (some expressive aphasia) Normal                       Personal Care Assistance Level of Assistance  Bathing, Feeding, Dressing Bathing Assistance: Limited assistance Feeding assistance: Independent Dressing Assistance: Limited assistance     Functional Limitations Info  Sight, Hearing, Speech Sight Info: Adequate Hearing Info: Adequate Speech Info: Adequate (some delays with word finding)    SPECIAL CARE FACTORS FREQUENCY  PT (By licensed PT), OT (By licensed OT)     PT Frequency: 5x week OT Frequency: 5x week            Contractures Contractures Info: Not present    Additional Factors Info  Code Status, Allergies Code Status Info: Full Code Allergies Info: Clindamycin/lincomycin, Nsaids           Current Medications (10/24/2019):  This is the current hospital active medication list Current Facility-Administered Medications  Medication Dose Route Frequency Provider Last Rate Last Admin  .  stroke: mapping our early stages of recovery book   Does not apply Once 10/26/2019 D, MD      . acetaminophen (TYLENOL) tablet 650 mg  650 mg Oral Q4H PRN Roberto Scales D, MD   650 mg at 10/23/19 10/25/19   Or  . acetaminophen (TYLENOL) 160 MG/5ML solution 650 mg  650 mg Per Tube Q4H PRN [id number], MD       Or  .  acetaminophen (TYLENOL) suppository 650 mg  650 mg Rectal Q4H PRN Roberto Scales D, MD      . ALPRAZolam Prudy Feeler) tablet 0.5 mg  0.5 mg Oral TID PRN Chotiner, Claudean Severance, MD   0.5 mg at 10/23/19 2016  . alum & mag hydroxide-simeth (MAALOX/MYLANTA) 200-200-20 MG/5ML suspension 30 mL  30 mL Oral Q6H PRN [place], Eric J, DO   30 mL at 10/23/19 2015  . apixaban (ELIQUIS) tablet 5 mg  5 mg Oral BID [place], Eric J, DO   5 mg at 10/24/19 3546  . atorvastatin (LIPITOR) tablet 40 mg  40 mg Oral q1800 [place], Eric J, DO   40 mg at 10/23/19  1736  . furosemide (LASIX) tablet 10 mg  10 mg Oral q AM [place], Eric J, DO   10 mg at 10/24/19 5681  . pantoprazole (PROTONIX) EC tablet 40 mg  40 mg Oral QHS [place], Eric J, DO   40 mg at 10/23/19 2015  . propranolol (INDERAL) tablet 10 mg  10 mg Oral BID [place], Eric J, DO   10 mg at 10/24/19 2751  . senna-docusate (Senokot-S) tablet 1 tablet  1 tablet Oral QHS PRN Laverna Peace, MD   1 tablet at 10/23/19 2015  . sodium chloride flush (NS) 0.9 % injection 3 mL  3 mL Intravenous Once Tegeler, Canary Brim, MD         Discharge Medications: Please see discharge summary for a list of discharge medications.  Relevant Imaging Results:  Relevant Lab Results:   Additional Information SS#- [id number]  Doy Hutching, LCSW

## 2019-10-24 NOTE — Telephone Encounter (Signed)
Wende Mott, Daughter of the patient called. The patient was scheduled to come in for labs 10/24/19 but was admitted to the hospital over the weekend. The daughter says the patient had blood work done during her hospital stay and the daughter wanted to know if Dr. Elease Hashimoto would be abe to check her hospital labs for what he needed.   The daughter also wanted to know if she should make a follow-up appt for more lab work. The patient is going to a rehab facility but it has not been determined what facility or for how long at this time.

## 2019-10-24 NOTE — Telephone Encounter (Signed)
Pt had a bmet while in hospital will forward message to Dr Elease Hashimoto for review .Zack Seal

## 2019-10-24 NOTE — Discharge Instructions (Signed)

## 2019-10-24 NOTE — TOC Initial Note (Signed)
Transition of Care [facility]) - Initial/Assessment Note    Patient Details  Name: Charlene Dickerson MRN: [id number] Date of Birth: 1932/08/26  Transition of Care [facility]) CM/SW Contact:    Alexander Mt, LCSW Phone Number: 10/24/2019, 11:55 AM  Clinical Narrative:                 CSW met with pt at bedside. Introduced self, role, reason for visit. Pt from home with her cat, she has multiple adult children that assist as needed. Confirmed PCP and home address. She was at [facility] at the beginning of the year and felt she had a good experience, she had been working with [facility] and recently was d/c in June. Pt daughter [name] expressed she thinks pt would benefit from SNF at [facility] again before returning home if able. If insurance does not approve placement then they would be okay with return home with [facility]. Pt reluctantly agrees- she is understanding, just eager to get home with her cat as soon as she can.   TOC team continues to follow, insurance auth started for pt under ref #[id number], [facility] does have a bed. Pt has received both COVID vaccinations.   Expected Discharge Plan: Skilled Nursing Facility Barriers to Discharge: Continued Medical Work up, Ship broker   Patient Goals and CMS Choice Patient states their goals for this hospitalization and ongoing recovery are:: to go home with her cat CMS Medicare.gov Compare Post Acute Care list provided to:: Patient Choice offered to / list presented to : Patient, Adult Children  Expected Discharge Plan and Services Expected Discharge Plan: [place] In-house Referral: Clinical Social Work Discharge Planning Services: CM Consult Post Acute Care Choice: [place] Living arrangements for the past 2 months: [place]  Prior Living Arrangements/Services Living arrangements for the past 2 months: Single Family Home Lives with:: Self, Pets Patient language and need for interpreter  reviewed:: Yes (no needs) Do you feel safe going back to the place where you live?: Yes      Need for Family Participation in Patient Care: Yes (Comment) (assistance w/ daily cares as needed) Care giver support system in place?: Yes (comment) (pt children) Current home services: DME (recently d/c from [facility]/ [place]) Criminal Activity/Legal Involvement Pertinent to Current Situation/Hospitalization: No - Comment as needed  Activities of Daily Living Home Assistive Devices/Equipment: Walker (specify type) ADL Screening (condition at time of admission) Patient's cognitive ability adequate to safely complete daily activities?: Yes Is the patient deaf or have difficulty hearing?: Yes Does the patient have difficulty seeing, even when wearing glasses/contacts?: No Does the patient have difficulty concentrating, remembering, or making decisions?: No Patient able to express need for assistance with ADLs?: Yes Does the patient have difficulty dressing or bathing?: No Independently performs ADLs?: Yes (appropriate for developmental age) Does the patient have difficulty walking or climbing stairs?: Yes Weakness of Legs: Both Weakness of Arms/Hands: Both  Permission Sought/Granted Permission sought to share information with : Family Supports, Chartered certified accountant granted to share information with : Yes, Verbal Permission Granted  Share Information with NAME: Amado Coe  Permission granted to share info w AGENCY: [place] granted to share info w Relationship: daughter  Permission granted to share info w Contact Information: [phone]  Emotional Assessment Appearance:: Appears stated age Attitude/Demeanor/Rapport: Engaged, Gracious Affect (typically observed): Accepting, Adaptable, Appropriate, Pleasant Orientation: : Oriented to Place, Oriented to Self, Oriented to Situation, Oriented to  Time Alcohol /  Substance Use: Not Applicable Psych  Involvement: No (comment)  Admission diagnosis:  Aphasia [R47.01] Word finding difficulty [R47.89] Patient Active Problem List   Diagnosis Date Noted  . 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 Ventricular tachycardia ([place]) 07/15/2019  . Malnutrition of moderate degree 06/21/2019  . AKI (acute kidney injury) ([place]) 06/20/2019  . Hyperkalemia 06/20/2019  . Metabolic acidosis 00/51/1021  . Uremia 06/20/2019  . Closed compression fracture of L4 lumbar vertebra, initial encounter ([place]) 06/20/2019  . CAD (coronary artery disease) 06/20/2019  . Dehydration 06/20/2019  . Chronic diastolic CHF (congestive heart failure) ([place]) 06/20/2019  . Essential hypertension 06/20/2019  . Low back pain 06/01/2019  . Paroxysmal atrial fibrillation (HCC)   . Persistent atrial fibrillation ([place]) 03/12/2018  . Shock circulatory ([place])   . Hemodynamic instability   . Central venous catheter in place   . STEMI (ST elevation myocardial infarction) ([place]) 02/18/2018  . Acute myocardial infarction ([place]) 02/18/2018  . Complete heart block ([place])   . Rectocele 11/26/2017  . Cystocele with rectocele 11/26/2017  . Diffuse cystic mastopathy of breast 05/09/2011   PCP:  Marton Redwood, MD Pharmacy:   [facility], [place] [address] Phone: [phone] Fax: [place], [address] AT [facility] [address] [place] [address] Phone: [phone] Fax: [phone]  Readmission Risk Interventions No flowsheet data found.

## 2019-10-24 NOTE — Progress Notes (Signed)
PROGRESS NOTE    Charlene Dickerson  VOZ:[id number] DOB: [birth year] DOA: 10/22/2019 PCP: Martha Clan, MD    Brief Narrative:  Charlene Dickerson is a 86 y.o. female with medical history significant for paroxysmal atrial fibrillation on Eliquis, CAD status post STEMI with DES x2 (01/2018), history of ventricular tachycardia, HTN, HLD who presented to the emergency department with several day history of word finding difficulties.  In the ED blood pressure 148/83 and given her word finding difficulty code stroke was initiated with neurology consultation.  [facility] was consulted for admission and further evaluation.   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  Initial concern for TIA versus CVA.  Neurology was consulted and followed during the hospital course.  CT head with no acute intracranial findings.  MR brain with no acute or subacute infarct.  CT angiogram head/neck with no emergent findings but with undulating appearance right CEA.  Hemoglobin A1c 5.4, LDL 91, HDL 83.  Given the persistence of symptoms with negative MRI neurology believes that she has some neurodegenerative process causing impairments in her left temporal region causing her difficulty with language and math.  No further indication of additional studies to include EEG or echocardiogram. --Continue atorvastatin 40 mg p.o. daily --Continue Eliquis 5 mg p.o. twice daily --Pending PT/OT/SLP evaluation  Essential hypertension Chronic diastolic congestive heart failure TTE 06/20/2019 with preserved LVEF, grade 1 diastolic dysfunction. --Continue propranolol 10 mg p.o. twice daily and  furosemide 10 mg p.o. daily  Paroxysmal atrial fibrillation CAD status post PCI Follows with cardiology outpatient.  MI December 2019 status post DES x2 to right RCA. --Continue Eliquis 5 mg p.o. twice daily --Continue monitor on telemetry, currently in NSR --Outpatient follow-up with cardiology  Hyperlipidemia Total cholesterol 187, HDL 83, LDL 91, triglycerides 63. --Continue atorvastatin 40 mg p.o. daily  B12 deficiency Vitamin B12 level of 189, low limit of normal. --Will start oral B12 1000 mcg p.o. daily  Visual hallucinations Patient continues with occasional visual hallucinations, worse at nighttime.  She reports individuals in her room, specifically construction workers walking behind her bed last night.  Currently not present. --Psychiatry evaluation for further recommendations, concern for possible some mild underlying dementia versus schizophrenia.   DVT prophylaxis: Eliquis Code Status: Full code Family Communication: Discussed with patient's daughter who is present at bedside this morning this morning  Disposition Plan:  Status is: Observation  The patient remains OBS appropriate and will d/c before 2 midnights.  Dispo: The patient is from: Home              Anticipated d/c is to: Home              Anticipated d/c date is: 1 day              Patient currently is not medically stable to d/c.   Consultants:   Neurology  Procedures:   None  Antimicrobials:   None   Subjective: Patient seen and examined bedside, resting comfortably. Does have some continued issues with word finding, but she feels this is improved.  Poor sleep overnight.  No other complaints or concerns at this time.  Denies headache, no  visual changes, no chest pain, palpitations, no shortness of breath, no abdominal pain, no weakness, no fatigue, no paresthesias.  No acute events overnight per nurse staff.  Objective: Vitals:   10/24/19 0239 10/24/19 0552 10/24/19 0804 10/24/19 1208   BP: 125/78 (!) 143/86 138/81 112/78  Pulse: 84 70 83 71  Resp: Temp: 97.8 F (36.6 C) 98 F (36.7 C) 97.7 F (36.5 C) (!) 97.5 F (36.4 C)  TempSrc: Oral Oral Oral Oral  SpO2:  96% 96% 98%  Weight:      Height:       No intake or output data in the 24 hours ending 10/24/19 1243 Filed Weights   10/22/19 1424 10/22/19 2104  Weight: 64.9 kg 64.9 kg    Examination:  General exam: Appears calm and comfortable  Respiratory system: Clear to auscultation. Respiratory effort normal. Cardiovascular system: S1 & S2 heard, RRR. No JVD, murmurs, rubs, gallops or clicks. No pedal edema. Gastrointestinal system: Abdomen is nondistended, soft and nontender. No organomegaly or masses felt. Normal bowel sounds heard. Central nervous system: Alert and oriented. No focal neurological deficits. Extremities: Symmetric 5 x 5 power. Skin: No rashes, lesions or ulcers Psychiatry: Judgement and insight appear poor. Mood & affect appropriate.  Reports visual hallucinations overnight    Data Reviewed: I have personally reviewed following labs and imaging studies  CBC: Recent Labs  Lab 10/22/19 1350 10/22/19 1355 10/23/19 0754 10/24/19 0848  WBC 5.1  --  4.8 4.6  NEUTROABS 3.2  --  3.2 2.9  HGB 13.6 13.9 14.7 14.5  HCT 41.9 41.0 45.9 44.7  MCV 100.0  --  99.6 99.6  PLT 176  --  184 187   Basic Metabolic Panel: Recent Labs  Lab 10/22/19 1350 10/22/19 1355 10/23/19 0754 10/24/19 0848  NA 136 137 140 138  K 4.2 4.0 4.0 4.1  CL 104 104 101 105  CO2 22  --  28 22  GLUCOSE 97 95 143* 143*  BUN CREATININE 0.77 0.70 0.82 0.93  CALCIUM 9.3  --  9.7 9.3   GFR: Estimated Creatinine Clearance: 39.1 mL/min (by C-G formula based on SCr of 0.93 mg/dL). Liver Function Tests: Recent Labs  Lab 10/22/19 1350 10/23/19 0754 10/24/19 0848  AST ALT ALKPHOS 41 40 36*  BILITOT 0.9 0.8 0.8  PROT 6.7 6.9 6.5  ALBUMIN 3.8 3.7 3.5   No results for  input(s): LIPASE, AMYLASE in the last 168 hours. Recent Labs  Lab 10/22/19 1624  AMMONIA 29   Coagulation Profile: Recent Labs  Lab 10/22/19 1350  INR 1.1   Cardiac Enzymes: No results for input(s): CKTOTAL, CKMB, CKMBINDEX, TROPONINI in the last 168 hours. BNP (last 3 results) No results for input(s): PROBNP in the last 8760 hours. HbA1C: Recent Labs    10/22/19 1624  HGBA1C 5.4   CBG: No results for input(s): GLUCAP in the last 168 hours. Lipid Profile: Recent Labs    10/22/19 1624  CHOL 187  HDL 83  LDLCALC 91  TRIG 63  CHOLHDL 2.3   Thyroid Function Tests: No results for input(s): TSH, T4TOTAL, FREET4, T3FREE, THYROIDAB in the last 72 hours. Anemia Panel: Recent Labs    10/23/19 1257  VITAMINB12 189   Sepsis Labs: No results for input(s): PROCALCITON, LATICACIDVEN in the last 168 hours.  Recent Results (from the past 240 hour(s))  SARS Coronavirus 2 by RT PCR (hospital  order, performed in [facility] hospital lab) Nasopharyngeal Nasopharyngeal Swab     Status: None   Collection Time: 10/22/19  6:05 PM   Specimen: Nasopharyngeal Swab  Result Value Ref Range Status   SARS Coronavirus 2 NEGATIVE NEGATIVE Final    Comment: (NOTE) SARS-CoV-2 target nucleic acids are NOT DETECTED.  The SARS-CoV-2 RNA is generally detectable in upper and lower respiratory specimens during the acute phase of infection. The lowest concentration of SARS-CoV-2 viral copies this assay can detect is 250 copies / mL. A negative result does not preclude SARS-CoV-2 infection and should not be used as the sole basis for treatment or other patient management decisions.  A negative result may occur with improper specimen collection / handling, submission of specimen other than nasopharyngeal swab, presence of viral mutation(s) within the areas targeted by this assay, and inadequate number of viral copies (<250 copies / mL). A negative result must be combined with  clinical observations, patient history, and epidemiological information.  Fact Sheet for Patients:   BoilerBrush.com.cy  Fact Sheet for Healthcare Providers: https://pope.com/  This test is not yet approved or  cleared by the [place] FDA and has been authorized for detection and/or diagnosis of SARS-CoV-2 by FDA under an Emergency Use Authorization (EUA).  This EUA will remain in effect (meaning this test can be used) for the duration of the COVID-19 declaration under Section 564(b)(1) of the Act, 21 U.S.C. section 360bbb-3(b)(1), unless the authorization is terminated or revoked sooner.  Performed at [facility], [address], [place], [place] [address]   Culture, Urine     Status: Abnormal   Collection Time: 10/23/19 10:22 AM   Specimen: Urine, Clean Catch  Result Value Ref Range Status   Specimen Description URINE, CLEAN CATCH  Final   Special Requests   Final    NONE Performed at [facility], [address] [place], [place] [address]    Culture (A)  Final    30,000 COLONIES/mL MULTIPLE SPECIES PRESENT, SUGGEST RECOLLECTION   Report Status 10/24/2019 FINAL  Final         Radiology Studies: CT Code Stroke CTA Head W/WO contrast  Result Date: 10/22/2019 CLINICAL DATA:  Code stroke for aphasia. EXAM: CT ANGIOGRAPHY HEAD AND NECK TECHNIQUE: Multidetector CT imaging of the head and neck was performed using the standard protocol during bolus administration of intravenous contrast. Multiplanar CT image reconstructions and MIPs were obtained to evaluate the vascular anatomy. Carotid stenosis measurements (when applicable) are obtained utilizing NASCET criteria, using the distal internal carotid diameter as the denominator. CONTRAST:  25mL OMNIPAQUE IOHEXOL 350 MG/ML SOLN COMPARISON:  Noncontrast head CT earlier today FINDINGS: CTA NECK FINDINGS Aortic arch: Atheromatous plaque. No acute finding. Three vessel  branching. Right carotid system: Calcified plaque at the bifurcation without stenosis or ulceration. Mild undulation of the ICA lumen. Negative for dissection. Left carotid system: Mild calcified plaque at the bifurcation. No stenosis, ulceration, or beading. Vertebral arteries: No proximal subclavian stenosis. Left vertebral artery dominance. The vertebral arteries are smooth and widely patent. Skeleton: Negative Other neck: No evidence of mass or inflammation. Upper chest: Negative Review of the MIP images confirms the above findings CTA HEAD FINDINGS Anterior circulation: Calcified plaque on the carotid siphons without stenosis or major branch occlusion. Negative for aneurysm or beading. Posterior circulation: Left vertebral artery dominance. The vertebral and basilar arteries are widely patent. Arterial elongation without aneurysm. No branch occlusion or proximal stenosis Venous sinuses: Patent as permitted by  contrast timing Anatomic variants: As above Review of the MIP images confirms the above findings IMPRESSION: 1. No emergent finding. 2. Undulating appearance of the right cervical ICA, possible mild FMD. 3. Atherosclerosis without proximal stenosis, mild for age. Electronically Signed   By: Marnee Spring M.D.   On: 10/22/2019 14:43   CT Code Stroke CTA Neck W/WO contrast  Result Date: 10/22/2019 CLINICAL DATA:  Code stroke for aphasia. EXAM: CT ANGIOGRAPHY HEAD AND NECK TECHNIQUE: Multidetector CT imaging of the head and neck was performed using the standard protocol during bolus administration of intravenous contrast. Multiplanar CT image reconstructions and MIPs were obtained to evaluate the vascular anatomy. Carotid stenosis measurements (when applicable) are obtained utilizing NASCET criteria, using the distal internal carotid diameter as the denominator. CONTRAST:  42mL OMNIPAQUE IOHEXOL 350 MG/ML SOLN COMPARISON:  Noncontrast head CT earlier today FINDINGS: CTA NECK FINDINGS Aortic arch:  Atheromatous plaque. No acute finding. Three vessel branching. Right carotid system: Calcified plaque at the bifurcation without stenosis or ulceration. Mild undulation of the ICA lumen. Negative for dissection. Left carotid system: Mild calcified plaque at the bifurcation. No stenosis, ulceration, or beading. Vertebral arteries: No proximal subclavian stenosis. Left vertebral artery dominance. The vertebral arteries are smooth and widely patent. Skeleton: Negative Other neck: No evidence of mass or inflammation. Upper chest: Negative Review of the MIP images confirms the above findings CTA HEAD FINDINGS Anterior circulation: Calcified plaque on the carotid siphons without stenosis or major branch occlusion. Negative for aneurysm or beading. Posterior circulation: Left vertebral artery dominance. The vertebral and basilar arteries are widely patent. Arterial elongation without aneurysm. No branch occlusion or proximal stenosis Venous sinuses: Patent as permitted by contrast timing Anatomic variants: As above Review of the MIP images confirms the above findings IMPRESSION: 1. No emergent finding. 2. Undulating appearance of the right cervical ICA, possible mild FMD. 3. Atherosclerosis without proximal stenosis, mild for age. Electronically Signed   By: Marnee Spring M.D.   On: 10/22/2019 14:43   MR BRAIN WO CONTRAST  Result Date: 10/22/2019 CLINICAL DATA:  Aphasia. EXAM: MRI HEAD WITHOUT CONTRAST TECHNIQUE: Multiplanar, multiecho pulse sequences of the brain and surrounding structures were obtained without intravenous contrast. COMPARISON:  CT studies same day FINDINGS: Brain: Diffusion imaging does not show any acute or subacute infarction. No focal abnormality affects the brainstem or cerebellum. Cerebral hemispheres show generalized age related atrophy with mild chronic small-vessel ischemic changes of the white matter. No cortical or large vessel territory infarction. No mass lesion, hemorrhage,  hydrocephalus or extra-axial collection. Vascular: Major vessels at the base of the brain show flow. Skull and upper cervical spine: Negative Sinuses/Orbits: Clear/normal.  Previous lens implants. Other: None IMPRESSION: No acute or subacute infarction. Age related volume loss. Mild chronic small-vessel change of the cerebral hemispheric white matter. Electronically Signed   By: Paulina Fusi M.D.   On: 10/22/2019 17:16   CT HEAD CODE STROKE WO CONTRAST  Result Date: 10/22/2019 CLINICAL DATA:  Code stroke. Limited stroke symptoms. Aphasia at 9 a.m. today EXAM: CT HEAD WITHOUT CONTRAST TECHNIQUE: Contiguous axial images were obtained from the base of the skull through the vertex without intravenous contrast. COMPARISON:  None. FINDINGS: Brain: No evidence of acute infarction, hemorrhage, hydrocephalus, extra-axial collection or mass lesion/mass effect. Age normal generalized volume loss and white matter low-density. Vascular: Tortuous intracranial vessels. Atheromatous calcification. No convincing hyperdensity. Skull: Normal. Negative for fracture or focal lesion. Sinuses/Orbits: No acute finding.  Bilateral cataract resection Other: These results were communicated  to Dr. Amada Jupiter at 2:01 pmon 8/21/2021by text page via the [facility] messaging system. ASPECTS [facility] Stroke Program Early CT Score) - Ganglionic level infarction (caudate, lentiform nuclei, internal capsule, insula, M1-M3 cortex): 3 - Supraganglionic infarction (M4-M6 cortex): 7 Total score (0-10 with 10 being normal): 10 IMPRESSION: No acute finding. Age congruent senescent findings. Electronically Signed   By: Marnee Spring M.D.   On: 10/22/2019 14:02        Scheduled Meds: .  stroke: mapping our early stages of recovery book   Does not apply Once  . apixaban  5 mg Oral BID  . atorvastatin  40 mg Oral q1800  . furosemide  10 mg Oral q AM  . pantoprazole  40 mg Oral QHS  . propranolol  10 mg Oral BID  . sodium chloride flush  3 mL  Intravenous Once   Continuous Infusions:   LOS: 0 days    Time spent: 29 minutes spent on chart review, discussion with nursing staff, consultants, updating family and interview/physical exam; more than 50% of that time was spent in counseling and/or coordination of care.    Alvira Philips [place], DO [facility] Available via Epic secure chat 7am-7pm After these hours, please refer to coverage provider listed on [email or web address] 10/24/2019, 12:43 PM

## 2019-10-24 NOTE — Consult Note (Signed)
[facility] Face-to-Face Psychiatry Consult   Reason for Consult: Hallucinations Referring Physician:   Patient Identification: Charlene Dickerson MRN:  [id number] Principal Diagnosis: Anomic aphasia Diagnosis:  Principal Problem:   Anomic aphasia Active Problems:   Paroxysmal atrial fibrillation (HCC)   CAD (coronary artery disease)   Chronic diastolic CHF (congestive heart failure) (HCC)   Essential hypertension   Ventricular tachycardia (HCC)   Total Time spent with patient: 30 minutes  Subjective:   Charlene Dickerson is a 86 y.o. female patient admitted as potential code stroke. Consult was placed due to possible hallucinations vs dementia and or schizophrenia. Patient describes vivid hallucinations that happen specifically at night. She states she has seen Holiday representative workers, and last night she see na  Little girl in a white and yellow floral dress with a basketof flowers standing at her bedside. She states she also seen specks of red paint on the mirror. Her daughter Charlene Dickerson reports that she is having some auditory hallucinations as well that are described as hymns. Patient does confirm this. Her daughter forwards much information to report this only happens when she is in a different place, and is able to recall on three occasions (hospital admissions) in which she has these hallucinations. She denies any suicidal ideations. SHe denies any previous psychiatric conditions at this time.   HPI:  Charlene Dickerson is a 86 y.o.femalewith medical history significant forparoxysmal atrial fibrillation on Eliquis, CAD status post STEMI with DES x2 (01/2018), history of ventricular tachycardia, HTN, HLDwho presented to the emergency department with several day history of word finding difficulties.  In the ED blood pressure 148/83 and given her word finding difficulty code stroke was initiated with neurology consultation.  TRH was consulted for admission and further evaluation.  Patient is a 86 year old  female who presents from home as a potential code stroke.  Psych consult was placed due to hallucinations versus dementia and schizophrenia.  Patient does verbalize visual and auditory hallucinations that are described as seeing people in her room, specifically at nighttime.  Patient's daughter who is at bedside confirmed that this happens when she is in a different place.  Did discuss treatment options to include watch and wait as hallucinations do appear to be related to delirium.Marland Kitchen  Also discussed with patient high vulnerability to develop acute delirium since being in the hospital.  Patient does not appear to be a harm to self or others although she is experiencing some auditory and visual hallucinations that are intermittent and in nature.  We will proceed with plan below.  Patient does express interest of returning back home to be with her cat.  She does not present to have any acute safety concerns at this time.    Past Psychiatric History: Denies  Risk to Self:  No Risk to Others: No Prior Inpatient Therapy:   None Prior Outpatient Therapy:   None  Past Medical History:  Past Medical History:  Diagnosis Date  . Diverticulosis of colon   . Essential tremor   . GERD (gastroesophageal reflux disease)    occasional (no meds)  . History of basal cell carcinoma excision 2012  . Hypertension   . Osteoporosis   . PONV (postoperative nausea and vomiting)   . Rectocele    symptomatic    Past Surgical History:  Procedure Laterality Date  . BREAST EXCISIONAL BIOPSY Left 1995  . BREAST EXCISIONAL BIOPSY Right 1988  . CARDIOVERSION N/A 04/16/2018   Procedure: CARDIOVERSION;  Surgeon: Parke Poisson, MD;  Location: [facility];  Service: Cardiovascular;  Laterality: N/A;  . CATARACT EXTRACTION W/ INTRAOCULAR LENS  IMPLANT, BILATERAL  right 09/ 2013;  left 10/ 2013  . COLONOSCOPY  last one 04/10/ 2012  . CORONARY STENT INTERVENTION N/A 02/18/2018   Procedure: CORONARY STENT  INTERVENTION;  Surgeon: Runell Gess, MD;  Location: [facility] INVASIVE CV LAB;  Service: Cardiovascular;  Laterality: N/A;  . D & C HYSTEROSCOPY W/ RESECTION ENDOMETRIAL POLYP AND LESION  07-27-2007   dr Ambrose Mantle @[facility]   . IR KYPHO LUMBAR INC FX REDUCE BONE BX UNI/BIL CANNULATION INC/IMAGING  07/11/2019  . LEFT HEART CATH AND CORONARY ANGIOGRAPHY N/A 02/18/2018   Procedure: LEFT HEART CATH AND CORONARY ANGIOGRAPHY;  Surgeon: 02/20/2018, MD;  Location: [facility] INVASIVE CV LAB;  Service: Cardiovascular;  Laterality: N/A;  . RECTOCELE REPAIR N/A 11/26/2017   Procedure: POSTERIOR REPAIR (RECTOCELE);  Surgeon: 11/28/2017, MD;  Location: [facility];  Service: Gynecology;  Laterality: N/A;  OUTPT IN BED  . TEMPORARY PACEMAKER N/A 02/18/2018   Procedure: TEMPORARY PACEMAKER;  Surgeon: 02/20/2018, MD;  Location: [facility] INVASIVE CV LAB;  Service: Cardiovascular;  Laterality: N/A;  . TUBAL LIGATION Bilateral yrs ago   Family History:  Family History  Problem Relation Age of Onset  . Cancer Sister        Breast  . Heart disease Sister   . Breast cancer Sister   . Heart disease Father   . Cancer Daughter        breast  . Breast cancer Daughter   . Cancer Maternal Aunt        breast  . Breast cancer Maternal Aunt   . Cancer Maternal Grandmother        breast  . Breast cancer Maternal Grandmother    Family Psychiatric  History: Denies Social History:  Social History   Substance and Sexual Activity  Alcohol Use No     Social History   Substance and Sexual Activity  Drug Use Never    Social History   Socioeconomic History  . Marital status: Widowed    Spouse name: Not on file  . Number of children: Not on file  . Years of education: Not on file  . Highest education level: Not on file  Occupational History  . Not on file  Tobacco Use  . Smoking status: Former Smoker    Quit date: 05/08/1961    Years since quitting: 58.5  . Smokeless tobacco: Never Used  Vaping  Use  . Vaping Use: Never used  Substance and Sexual Activity  . Alcohol use: No  . Drug use: Never  . Sexual activity: Not on file  Other Topics Concern  . Not on file  Social History Narrative  . Not on file   Social Determinants of Health   Financial Resource Strain:   . Difficulty of Paying Living Expenses: Not on file  Food Insecurity:   . Worried About 07/08/1961 in the Last Year: Not on file  . Ran Out of Food in the Last Year: Not on file  Transportation Needs:   . Lack of Transportation (Medical): Not on file  . Lack of Transportation (Non-Medical): Not on file  Physical Activity:   . Days of Exercise per Week: Not on file  . Minutes of Exercise per Session: Not on file  Stress:   . Feeling of Stress : Not on file  Social Connections:   . Frequency of Communication with  Friends and Family: Not on file  . Frequency of Social Gatherings with Friends and Family: Not on file  . Attends Religious Services: Not on file  . Active Member of Clubs or Organizations: Not on file  . Attends Banker Meetings: Not on file  . Marital Status: Not on file   Additional Social History:    Allergies:   Allergies  Allergen Reactions  . Clindamycin/Lincomycin Diarrhea and Other (See Comments)    "c-diff"  . Nsaids Other (See Comments)    Was told to not take these    Labs:  Results for orders placed or performed during the hospital encounter of 10/22/19 (from the past 48 hour(s))  Ammonia     Status: None   Collection Time: 10/22/19  4:24 PM  Result Value Ref Range   Ammonia 29 9 - 35 umol/L    Comment: Performed at [facility], [address] [place], [place] [address]  Lipid panel     Status: None   Collection Time: 10/22/19  4:24 PM  Result Value Ref Range   Cholesterol 187 0 - 200 mg/dL   Triglycerides 63 <621 mg/dL   HDL 83 >30 mg/dL   Total CHOL/HDL Ratio 2.3 RATIO   VLDL 13 0 - 40 mg/dL   LDL Cholesterol 91 0 - 99 mg/dL    Comment:         Total Cholesterol/HDL:CHD Risk Coronary Heart Disease Risk Table                     Men   Women  1/2 Average Risk   3.4   3.3  Average Risk       5.0   4.4  2 X Average Risk   9.6   7.1  3 X Average Risk  23.4   11.0        Use the calculated Patient Ratio above and the CHD Risk Table to determine the patient's CHD Risk.        ATP III CLASSIFICATION (LDL):  <100     mg/dL   Optimal  865-784  mg/dL   Near or Above                    Optimal  130-159  mg/dL   Borderline  696-295  mg/dL   High  >284     mg/dL   Very High Performed at [facility], [address], [place], [place] [address]   Hemoglobin A1c     Status: None   Collection Time: 10/22/19  4:24 PM  Result Value Ref Range   Hgb A1c MFr Bld 5.4 4.8 - 5.6 %    Comment: (NOTE) Pre diabetes:          5.7%-6.4%  Diabetes:              >6.4%  Glycemic control for   <7.0% adults with diabetes    Mean Plasma Glucose 108.28 mg/dL    Comment: Performed at [facility] Lab, [address] [place], [place] [address]  SARS Coronavirus 2 by RT PCR (hospital order, performed in [facility] hospital lab) Nasopharyngeal Nasopharyngeal Swab     Status: None   Collection Time: 10/22/19  6:05 PM   Specimen: Nasopharyngeal Swab  Result Value Ref Range   SARS Coronavirus 2 NEGATIVE NEGATIVE    Comment: (NOTE) SARS-CoV-2 target nucleic acids are NOT DETECTED.  The SARS-CoV-2 RNA is generally  detectable in upper and lower respiratory specimens during the acute phase of infection. The lowest concentration of SARS-CoV-2 viral copies this assay can detect is 250 copies / mL. A negative result does not preclude SARS-CoV-2 infection and should not be used as the sole basis for treatment or other patient management decisions.  A negative result may occur with improper specimen collection / handling, submission of specimen other than nasopharyngeal swab, presence of viral mutation(s) within the areas targeted by this  assay, and inadequate number of viral copies (<250 copies / mL). A negative result must be combined with clinical observations, patient history, and epidemiological information.  Fact Sheet for Patients:   BoilerBrush.com.cy  Fact Sheet for Healthcare Providers: https://pope.com/  This test is not yet approved or  cleared by the [place] FDA and has been authorized for detection and/or diagnosis of SARS-CoV-2 by FDA under an Emergency Use Authorization (EUA).  This EUA will remain in effect (meaning this test can be used) for the duration of the COVID-19 declaration under Section 564(b)(1) of the Act, 21 U.S.C. section 360bbb-3(b)(1), unless the authorization is terminated or revoked sooner.  Performed at [facility] Lab, [address] [place], [place] [address]   CBC with Differential/Platelet     Status: None   Collection Time: 10/23/19  7:54 AM  Result Value Ref Range   WBC 4.8 4.0 - 10.5 K/uL   RBC 4.61 3.87 - 5.11 MIL/uL   Hemoglobin 14.7 12.0 - 15.0 g/dL   HCT 19.1 36 - 46 %   MCV 99.6 80.0 - 100.0 fL   MCH 31.9 26.0 - 34.0 pg   MCHC 32.0 30.0 - 36.0 g/dL   RDW 47.8 29.5 - 62.1 %   Platelets 184 150 - 400 K/uL   nRBC 0.0 0.0 - 0.2 %   Neutrophils Relative % 66 %   Neutro Abs 3.2 1.7 - 7.7 K/uL   Lymphocytes Relative 20 %   Lymphs Abs 1.0 0.7 - 4.0 K/uL   Monocytes Relative 12 %   Monocytes Absolute 0.6 0 - 1 K/uL   Eosinophils Relative 2 %   Eosinophils Absolute 0.1 0 - 0 K/uL   Basophils Relative 0 %   Basophils Absolute 0.0 0 - 0 K/uL   Immature Granulocytes 0 %   Abs Immature Granulocytes 0.01 0.00 - 0.07 K/uL    Comment: Performed at [facility] Lab, [address], [place], [place] [address]  Comprehensive metabolic panel     Status: Abnormal   Collection Time: 10/23/19  7:54 AM  Result Value Ref Range   Sodium 140 135 - 145 mmol/L   Potassium 4.0 3.5 - 5.1 mmol/L   Chloride 101 98 - 111 mmol/L    CO2 28 22 - 32 mmol/L   Glucose, Bld 143 (H) 70 - 99 mg/dL    Comment: Glucose reference range applies only to samples taken after fasting for at least 8 hours.   BUN 13 8 - 23 mg/dL   Creatinine, Ser 7.84 0.44 - 1.00 mg/dL   Calcium 9.7 8.9 - 69.6 mg/dL   Total Protein 6.9 6.5 - 8.1 g/dL   Albumin 3.7 3.5 - 5.0 g/dL   AST 21 15 - 41 U/L   ALT 17 0 - 44 U/L   Alkaline Phosphatase 40 38 - 126 U/L   Total Bilirubin 0.8 0.3 - 1.2 mg/dL   GFR calc non Af Amer >60 >60 mL/min   GFR calc Af Amer >60 >60  mL/min   Anion gap 11 5 - 15    Comment: Performed at [facility] Lab, [address], [place], [place] [address]  Culture, Urine     Status: Abnormal   Collection Time: 10/23/19 10:22 AM   Specimen: Urine, Clean Catch  Result Value Ref Range   Specimen Description URINE, CLEAN CATCH    Special Requests      NONE Performed at [facility], [address], [place], [place] [address]    Culture (A)     30,000 COLONIES/mL MULTIPLE SPECIES PRESENT, SUGGEST RECOLLECTION   Report Status 10/24/2019 FINAL   Urinalysis, Routine w reflex microscopic Urine, Clean Catch     Status: Abnormal   Collection Time: 10/23/19 10:45 AM  Result Value Ref Range   Color, Urine YELLOW YELLOW   APPearance CLEAR CLEAR   Specific Gravity, Urine 1.019 1.005 - 1.030   pH 6.0 5.0 - 8.0   Glucose, UA NEGATIVE NEGATIVE mg/dL   Hgb urine dipstick SMALL (A) NEGATIVE   Bilirubin Urine NEGATIVE NEGATIVE   Ketones, ur NEGATIVE NEGATIVE mg/dL   Protein, ur NEGATIVE NEGATIVE mg/dL   Nitrite NEGATIVE NEGATIVE   Leukocytes,Ua MODERATE (A) NEGATIVE   RBC / HPF 0-5 0 - 5 RBC/hpf   WBC, UA 6-10 0 - 5 WBC/hpf   Bacteria, UA NONE SEEN NONE SEEN   Squamous Epithelial / LPF 0-5 0 - 5   Mucus PRESENT     Comment: Performed at [facility] Lab, [address] [place], [place] [address]  Vitamin B12     Status: None   Collection Time: 10/23/19 12:57 PM  Result Value Ref Range   Vitamin B-12 189 180 - 914 pg/mL     Comment: (NOTE) This assay is not validated for testing neonatal or myeloproliferative syndrome specimens for Vitamin B12 levels. Performed at [facility], [address], [place], [place] [address]   CBC with Differential/Platelet     Status: None   Collection Time: 10/24/19  8:48 AM  Result Value Ref Range   WBC 4.6 4.0 - 10.5 K/uL   RBC 4.49 3.87 - 5.11 MIL/uL   Hemoglobin 14.5 12.0 - 15.0 g/dL   HCT 02.5 36 - 46 %   MCV 99.6 80.0 - 100.0 fL   MCH 32.3 26.0 - 34.0 pg   MCHC 32.4 30.0 - 36.0 g/dL   RDW 85.2 77.8 - 24.2 %   Platelets 187 150 - 400 K/uL   nRBC 0.0 0.0 - 0.2 %   Neutrophils Relative % 62 %   Neutro Abs 2.9 1.7 - 7.7 K/uL   Lymphocytes Relative 23 %   Lymphs Abs 1.1 0.7 - 4.0 K/uL   Monocytes Relative 12 %   Monocytes Absolute 0.6 0 - 1 K/uL   Eosinophils Relative 2 %   Eosinophils Absolute 0.1 0 - 0 K/uL   Basophils Relative 1 %   Basophils Absolute 0.0 0 - 0 K/uL   Immature Granulocytes 0 %   Abs Immature Granulocytes 0.02 0.00 - 0.07 K/uL    Comment: Performed at [facility], [address], [place], [place] [address]  Comprehensive metabolic panel     Status: Abnormal   Collection Time: 10/24/19  8:48 AM  Result Value Ref Range   Sodium 138 135 - 145 mmol/L   Potassium 4.1 3.5 - 5.1 mmol/L   Chloride 105 98 - 111 mmol/L   CO2 22 22 - 32 mmol/L  Glucose, Bld 143 (H) 70 - 99 mg/dL    Comment: Glucose reference range applies only to samples taken after fasting for at least 8 hours.   BUN 14 8 - 23 mg/dL   Creatinine, Ser 1.91 0.44 - 1.00 mg/dL   Calcium 9.3 8.9 - 47.8 mg/dL   Total Protein 6.5 6.5 - 8.1 g/dL   Albumin 3.5 3.5 - 5.0 g/dL   AST 19 15 - 41 U/L   ALT 16 0 - 44 U/L   Alkaline Phosphatase 36 (L) 38 - 126 U/L   Total Bilirubin 0.8 0.3 - 1.2 mg/dL   GFR calc non Af Amer 56 (L) >60 mL/min   GFR calc Af Amer >60 >60 mL/min   Anion gap 11 5 - 15    Comment: Performed at [facility], [address] [place],  [place] [address]    Current Facility-Administered Medications  Medication Dose Route Frequency Provider Last Rate Last Admin  .  stroke: mapping our early stages of recovery book   Does not apply Once Roberto Scales D, MD      . acetaminophen (TYLENOL) tablet 650 mg  650 mg Oral Q4H PRN Roberto Scales D, MD   650 mg at 10/23/19 1308   Or  . acetaminophen (TYLENOL) 160 MG/5ML solution 650 mg  650 mg Per Tube Q4H PRN Roberto Scales D, MD       Or  . acetaminophen (TYLENOL) suppository 650 mg  650 mg Rectal Q4H PRN Roberto Scales D, MD      . ALPRAZolam Prudy Feeler) tablet 0.5 mg  0.5 mg Oral TID PRN Chotiner, Claudean Severance, MD   0.5 mg at 10/23/19 2016  . alum & mag hydroxide-simeth (MAALOX/MYLANTA) 200-200-20 MG/5ML suspension 30 mL  30 mL Oral Q6H PRN [place], Eric J, DO   30 mL at 10/23/19 2015  . apixaban (ELIQUIS) tablet 5 mg  5 mg Oral BID [place], Eric J, DO   5 mg at 10/24/19 6578  . atorvastatin (LIPITOR) tablet 40 mg  40 mg Oral q1800 [place], Eric J, DO   40 mg at 10/23/19 1736  . furosemide (LASIX) tablet 10 mg  10 mg Oral q AM [place], Eric J, DO   10 mg at 10/24/19 4696  . pantoprazole (PROTONIX) EC tablet 40 mg  40 mg Oral QHS [place], Eric J, DO   40 mg at 10/23/19 2015  . propranolol (INDERAL) tablet 10 mg  10 mg Oral BID [place], Eric J, DO   10 mg at 10/24/19 2952  . senna-docusate (Senokot-S) tablet 1 tablet  1 tablet Oral QHS PRN Laverna Peace, MD   1 tablet at 10/23/19 2015  . sodium chloride flush (NS) 0.9 % injection 3 mL  3 mL Intravenous Once Tegeler, Canary Brim, MD      . vitamin B-12 (CYANOCOBALAMIN) tablet 1,000 mcg  1,000 mcg Oral Daily [place], Alvira Philips, DO   1,000 mcg at 10/24/19 1337    Musculoskeletal: Strength & Muscle Tone: within normal limits Gait & Station: normal Patient leans: N/A  Psychiatric Specialty Exam: Physical Exam  Review of Systems  Blood pressure 112/78, pulse 71, temperature (!) 97.5 F (36.4 C), temperature source Oral, resp. rate 16, height 5'  2" (1.575 m), weight 64.9 kg, SpO2 98 %.Body mass index is 26.16 kg/m.  General Appearance: Well Groomed wearing makeup  Eye Contact:  Good  Speech:  Clear and Coherent and Normal Rate  Volume:  Normal  Mood:  Euthymic  Affect:  Appropriate and Congruent  Thought Process:  Coherent, Goal Directed, Linear and Descriptions of Associations: Intact  Orientation:  Full (Time, Place, and Person)  Thought Content:  WDL and Hallucinations: Visual  Suicidal Thoughts:  No  Homicidal Thoughts:  No  Memory:  Immediate;   Good Recent;   Good  Judgement:  Good  Insight:  Good  Psychomotor Activity:  Normal  Concentration:  Concentration: Good and Attention Span: Good  Recall:  Good  Fund of Knowledge:  Good  Language:  Good  Akathisia:  No  Handed:  Right  AIMS (if indicated):     Assets:  Communication Skills Desire for Improvement Financial Resources/Insurance Housing Leisure Time Physical Health Social Support  ADL's:  Intact  Cognition:  WNL  Sleep:      Careful management of pharmacological therapy, space-time reorientation, early mobilization, minimization of restraint use, and adequate sleep hygiene is the most recommended option for preventing delirium in hospitalized patients.  -Start seroquel 25mg  po qhs -ekg QTc 442  Treatment Plan Summary: Plan Will start low dose Seroquel 25mg  po qhs for delirium. Discussed with patient and daughter, about discontinuation of BZD as it is not recommended in elderly patients. They both verbalize understanding. Did discuss delirium and expected course with patient. Hallucinations should improve over time, or upon returning home.    Disposition: No evidence of imminent risk to self or others at present.   Patient does not meet criteria for psychiatric inpatient admission. Supportive therapy provided about ongoing stressors.  Maryagnes Amos, FNP 10/24/2019 2:32 PM

## 2019-10-24 NOTE — TOC Progression Note (Addendum)
Transition of Care [facility]) - Progression Note    Patient Details  Name: Charlene Dickerson MRN: [id number] Date of Birth: Jul 04, 1932  [facility] CM/SW Contact  Doy Hutching, [place] Phone Number: 10/24/2019, 2:46 PM  Clinical Narrative:    Berkley Harvey received for [facility]. This Clinical research associate called and updated [name], pt daughter with this information. She confirms pt was just swabbed again for COVID per protocol. Auth ref #[id number] Approved 8/24-8/26. This was communicated to admissions liaison Everardo Pacific who is ready for pt tomorrow pending new negative COVID result.   Expected Discharge Plan: Skilled Nursing Facility Barriers to Discharge: Continued Medical Work up, English as a second language teacher  Expected Discharge Plan and Services Expected Discharge Plan: Skilled Nursing Facility In-house Referral: Clinical Social Work Discharge Planning Services: CM Consult Post Acute Care Choice: Skilled Nursing Facility Living arrangements for the past 2 months: Single Family Home  Readmission Risk Interventions No flowsheet data found.

## 2019-10-24 NOTE — Telephone Encounter (Signed)
No need to redraw CMP

## 2019-10-25 ENCOUNTER — Ambulatory Visit: Payer: Medicare Other

## 2019-10-25 LAB — CBC WITH DIFFERENTIAL/PLATELET
Abs Immature Granulocytes: 0.01 10*3/uL (ref 0.00–0.07)
Basophils Absolute: 0 10*3/uL (ref 0.0–0.1)
Basophils Relative: 1 %
Eosinophils Absolute: 0.2 10*3/uL (ref 0.0–0.5)
Eosinophils Relative: 3 %
HCT: 42.9 % (ref 36.0–46.0)
Hemoglobin: 13.7 g/dL (ref 12.0–15.0)
Immature Granulocytes: 0 %
Lymphocytes Relative: 31 %
Lymphs Abs: 1.5 10*3/uL (ref 0.7–4.0)
MCH: 32.1 pg (ref 26.0–34.0)
MCHC: 31.9 g/dL (ref 30.0–36.0)
MCV: 100.5 fL — ABNORMAL HIGH (ref 80.0–100.0)
Monocytes Absolute: 0.7 10*3/uL (ref 0.1–1.0)
Monocytes Relative: 15 %
Neutro Abs: 2.6 10*3/uL (ref 1.7–7.7)
Neutrophils Relative %: 50 %
Platelets: 170 10*3/uL (ref 150–400)
RBC: 4.27 MIL/uL (ref 3.87–5.11)
RDW: 12.2 % (ref 11.5–15.5)
WBC: 5 10*3/uL (ref 4.0–10.5)
nRBC: 0 % (ref 0.0–0.2)

## 2019-10-25 LAB — COMPREHENSIVE METABOLIC PANEL
ALT: 16 U/L (ref 0–44)
AST: 17 U/L (ref 15–41)
Albumin: 3.3 g/dL — ABNORMAL LOW (ref 3.5–5.0)
Alkaline Phosphatase: 37 U/L — ABNORMAL LOW (ref 38–126)
Anion gap: 9 (ref 5–15)
BUN: 17 mg/dL (ref 8–23)
CO2: 27 mmol/L (ref 22–32)
Calcium: 9.2 mg/dL (ref 8.9–10.3)
Chloride: 105 mmol/L (ref 98–111)
Creatinine, Ser: 0.84 mg/dL (ref 0.44–1.00)
GFR calc Af Amer: >60 mL/min (ref >60)
GFR calc non Af Amer: >60 mL/min (ref >60)
Glucose, Bld: 91 mg/dL (ref 70–99)
Potassium: 4.5 mmol/L (ref 3.5–5.1)
Sodium: 141 mmol/L (ref 135–145)
Total Bilirubin: 0.8 mg/dL (ref 0.3–1.2)
Total Protein: 6.1 g/dL — ABNORMAL LOW (ref 6.5–8.1)

## 2019-10-25 MED ORDER — VITAMIN D (ERGOCALCIFEROL) 1.25 MG (50000 UNIT) PO CAPS: 50000.0000 [IU] | ORAL_CAPSULE | ORAL | 0 refills | Status: AC

## 2019-10-25 MED ORDER — QUETIAPINE FUMARATE 25 MG PO TABS: 25.0000 mg | ORAL_TABLET | Freq: Every day | ORAL | 0 refills | Status: DC

## 2019-10-25 MED ORDER — PANTOPRAZOLE SODIUM 40 MG PO TBEC
40.0000 mg | DELAYED_RELEASE_TABLET | Freq: Every day | ORAL | 0 refills | Status: DC
Start: 2019-10-25 — End: 2020-06-06

## 2019-10-25 MED ORDER — PROPRANOLOL HCL 10 MG PO TABS: 10.0000 mg | ORAL_TABLET | Freq: Two times a day (BID) | ORAL | 0 refills | Status: AC

## 2019-10-25 MED ORDER — SENNOSIDES-DOCUSATE SODIUM 8.6-50 MG PO TABS: 1.0000 | ORAL_TABLET | Freq: Every evening | ORAL | 0 refills | Status: AC | PRN

## 2019-10-25 MED ORDER — ACETAMINOPHEN 325 MG PO TABS: 650.0000 mg | ORAL_TABLET | ORAL | 0 refills | Status: AC | PRN

## 2019-10-25 MED ORDER — APIXABAN 5 MG PO TABS: 5.0000 mg | ORAL_TABLET | Freq: Two times a day (BID) | ORAL | 0 refills | Status: AC

## 2019-10-25 MED ORDER — LORATADINE 10 MG PO TABS: 10.0000 mg | ORAL_TABLET | Freq: Every day | ORAL | 0 refills | Status: AC

## 2019-10-25 MED ORDER — NITROGLYCERIN 0.4 MG SL SUBL: 0.4000 mg | SUBLINGUAL_TABLET | SUBLINGUAL | 3 refills | Status: AC | PRN

## 2019-10-25 MED ORDER — FUROSEMIDE 20 MG PO TABS: 10.0000 mg | ORAL_TABLET | Freq: Every morning | ORAL | 0 refills | Status: AC

## 2019-10-25 MED ORDER — ATORVASTATIN CALCIUM 40 MG PO TABS: 40.0000 mg | ORAL_TABLET | Freq: Every day | ORAL | 0 refills | Status: AC

## 2019-10-25 MED ORDER — CYANOCOBALAMIN 1000 MCG PO TABS: 1000.0000 ug | ORAL_TABLET | Freq: Every day | ORAL | 0 refills | Status: AC

## 2019-10-25 MED ORDER — ALPRAZOLAM 0.5 MG PO TABS: 0.5000 mg | ORAL_TABLET | Freq: Three times a day (TID) | ORAL | 0 refills | Status: AC | PRN

## 2019-10-25 NOTE — Telephone Encounter (Signed)
Pt's daughter aware no need to repeat labs Per Dr Elease Hashimoto .Zack Seal

## 2019-10-25 NOTE — TOC Transition Note (Signed)
Transition of Care [facility]) - CM/SW Discharge Note   Patient Details  Name: Charlene Dickerson MRN: [id number] Date of Birth: 1932-05-02  Transition of Care [facility]) CM/SW Contact:  Doy Hutching, LCSW Phone Number: 10/25/2019, 12:14 PM   Clinical Narrative:    CSW spoke with pt and pt daughter at bedside. Pt ready for d/c, all scripts have been printed and signed. Pt daughter will transport pt via her car to SNF. PTAR papers completed as back up. RN Pieter Partridge aware to call report and that SNF requesting pt arrive after 2pm.    Final next level of care: Skilled Nursing Facility Barriers to Discharge: Barriers Resolved   Patient Goals and CMS Choice Patient states their goals for this hospitalization and ongoing recovery are:: to go home with her cat CMS Medicare.gov Compare Post Acute Care list provided to:: Other (Comment Required) (pt and pt daughter specifically request [facility]) Choice offered to / list presented to : Patient, Adult Children  Discharge Placement Existing PASRR number confirmed : 10/24/19          Patient [facility] bed at: [facility] Patient to be transferred to facility by: personal car Name of family member notified: pt daughter [name] at bedside. Patient and family notified of of transfer: 10/25/19  Discharge Plan and Services In-house Referral: Clinical Social Work Discharge Planning Services: CM Consult Post Acute Care Choice: Skilled Nursing Facility         Readmission Risk Interventions No flowsheet data found.

## 2019-10-25 NOTE — Social Work (Addendum)
Clinical Social Worker facilitated patient discharge including contacting patient family and facility to confirm patient discharge plans.  Clinical information faxed to facility and family agreeable with plan.  CSW aware pt to transport via daughter's car to [facility] room 1008P. RN to call [phone]  with report prior to discharge.  Clinical Social Worker will sign off for now as social work intervention is no longer needed. Please consult [place] again if new need arises.  Octavio Graves, MSW, LCSW Clinical Social Worker

## 2019-10-25 NOTE — Discharge Summary (Signed)
Physician Discharge Summary  Charlene Dickerson QTM:[id number] DOB: 06-13-1932 DOA: 10/22/2019  PCP: Martha Clan, MD  Admit date: 10/22/2019 Discharge date: 10/25/2019  Admitted From: Home  Disposition:  [facility]  Recommendations for Outpatient Follow-up:  1. Follow up with PCP in 1-2 weeks 2. Started on Seroquel for hallucinations by psychiatry   Discharge Condition: Stable CODE STATUS: Full code Diet recommendation: Heart healthy diet  History of present illness:  Charlene Dickerson is a 86 y.o.femalewith medical history significant forparoxysmal atrial fibrillation on Eliquis, CAD status post STEMI with DES x2 (01/2018), history of ventricular tachycardia, HTN, HLDwho presented to the emergency department with several day history of word finding difficulties.  In the ED blood pressure 148/83 and given her word finding difficulty code stroke was initiated with neurology consultation.  TRH was consulted for admission and further evaluation.  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  Initial concern for TIA versus CVA.  Neurology was consulted and followed during the hospital course.  CT head with no acute intracranial findings.  MR brain with no acute or subacute infarct.  CT angiogram head/neck with no emergent findings but with undulating appearance right CEA.  Hemoglobin A1c 5.4, LDL 91, HDL 83.  Given the persistence of symptoms with negative MRI neurology believes that she has some neurodegenerative process causing impairments in her left temporal region causing her difficulty with language and math.  No further indication of additional studies to include EEG or echocardiogram. Continue atorvastatin 40 mg p.o. daily, Continue Eliquis 5 mg p.o. twice daily.  Continue PT/OT/SLP efforts at rehab.  Essential hypertension Chronic diastolic congestive heart failure TTE 06/20/2019  with preserved LVEF, grade 1 diastolic dysfunction. Continue propranolol 10 mg p.o. twice daily and furosemide 10 mg p.o. daily  Paroxysmal atrial fibrillation CAD status post PCI Follows with cardiology outpatient.  MI December 2019 status post DES x2 to right RCA. Continue Eliquis 5 mg p.o. twice daily. Outpatient follow-up with cardiology.   Hyperlipidemia Total cholesterol 187, HDL 83, LDL 91, triglycerides 63. Continue atorvastatin 40 mg p.o. daily  B12 deficiency Vitamin B12 level of 189, low limit of normal. Will start oral B12 1000 mcg p.o. daily  Visual hallucinations thought to be secondary to acute delirium Patient continues with occasional visual hallucinations, worse at nighttime. She reports individuals in her room, specifically construction workers walking behind her bed.  Psychiatry is consulted and recommended initiation of Seroquel 25 mg p.o. nightly.  Discharge Diagnoses:  Principal Problem:   Anomic aphasia Active Problems:   Paroxysmal atrial fibrillation (HCC)   CAD (coronary artery disease)   Chronic diastolic CHF (congestive heart failure) (HCC)   Essential hypertension   Ventricular tachycardia [facility])    Discharge Instructions  Discharge Instructions    Ambulatory referral to Neurology   Complete by: As directed    An appointment is requested in approximately: 4 weeks   Call MD for:  difficulty breathing, headache or visual disturbances   Complete by: As directed    Call MD for:  extreme fatigue   Complete by: As directed    Call MD for:  persistant dizziness or light-headedness   Complete by: As directed    Call MD for:  persistant nausea and vomiting   Complete by: As directed    Call MD for:  severe uncontrolled pain   Complete by: As directed    Call MD for:  temperature >100.4   Complete by: As directed    Diet - low sodium heart healthy   Complete by: As directed    Increase activity slowly   Complete by: As directed      Allergies  as of 10/25/2019      Reactions   Clindamycin/lincomycin Diarrhea, Other (See Comments)   "c-diff"   Nsaids Other (See Comments)   Was told to not take these      Medication List    STOP taking these medications   acetaminophen 650 MG CR tablet Commonly known as: TYLENOL Replaced by: acetaminophen 325 MG tablet     TAKE these medications   acetaminophen 325 MG tablet Commonly known as: TYLENOL Take 2 tablets (650 mg total) by mouth every 4 (four) hours as needed for mild pain (or temp > 37.5 C (99.5 F)). Replaces: acetaminophen 650 MG CR tablet   ALPRAZolam 0.5 MG tablet Commonly known as: XANAX Take 1 tablet (0.5 mg total) by mouth 3 (three) times daily as needed for anxiety. What changed:   medication strength  how much to take  when to take this  Another medication with the same name was removed. Continue taking this medication, and follow the directions you see here.   apixaban 5 MG Tabs tablet Commonly known as: Eliquis Take 1 tablet (5 mg total) by mouth 2 (two) times daily.   atorvastatin 40 MG tablet Commonly known as: LIPITOR Take 1 tablet (40 mg total) by mouth daily with supper.   calcium citrate 950 (200 Ca) MG tablet Commonly known as: CALCITRATE - dosed in mg elemental calcium Take 200 mg of elemental calcium by mouth daily.   cyanocobalamin 1000 MCG tablet Take 1 tablet (1,000 mcg total) by mouth daily. Start taking on: October 26, 2019   docusate sodium 100 MG capsule Commonly known as: COLACE Take 100 mg by mouth daily as needed for mild constipation.   feeding supplement (ENSURE ENLIVE) Liqd Take 237 mLs by mouth 2 (two) times daily between meals.   furosemide 20 MG tablet Commonly known as: LASIX Take 0.5 tablets (10 mg total) by mouth in the morning. Start taking on: October 26, 2019 What changed:   how much to take  when to take this   latanoprost 0.005 % ophthalmic solution Commonly known as: XALATAN Place 1 drop into both eyes  at bedtime.   loratadine 10 MG tablet Commonly known as: CLARITIN Take 1 tablet (10 mg total) by mouth daily.   multivitamin with minerals Tabs tablet Take 1 tablet by mouth daily.   nitroGLYCERIN 0.4 MG SL tablet Commonly known as: Nitrostat Place 1 tablet (0.4 mg total) under the tongue every 5 (five) minutes as needed for chest pain.   pantoprazole 40 MG tablet Commonly known as: PROTONIX Take 1 tablet (40 mg total) by mouth at bedtime. What changed: See the new instructions.   propranolol 10 MG tablet Commonly known as: INDERAL Take 1 tablet (10 mg total) by mouth 2 (two) times daily with a meal.   PSYLLIUM PO Take 1 capsule by mouth 2 (two) times daily.   QUEtiapine 25 MG tablet Commonly known as: SEROQUEL Take 1 tablet (25 mg total) by mouth at bedtime.   senna-docusate 8.6-50 MG tablet Commonly known as: Senokot-S Take 1 tablet by mouth at bedtime as needed for moderate constipation.   Vitamin D (Ergocalciferol) 1.25 MG (50000 UNIT) Caps capsule Commonly known as: DRISDOL Take 1 capsule (50,000 Units total) by mouth every Wednesday.  Start taking on: October 26, 2019       Contact information for follow-up providers    Martha Clan, MD. Schedule an appointment as soon as possible for a visit in 1 week(s).   Specialty: Internal Medicine Contact information: [address] [place] [address] [phone]        Nahser, Deloris Ping, MD .   Specialty: Cardiology Contact information: [address] [place] [address] [phone]            Contact information for after-discharge care    Destination    [facility] .   Service: Skilled Nursing Contact information: [address] [place] [address] [phone]                 Allergies  Allergen Reactions  . Clindamycin/Lincomycin Diarrhea and Other (See Comments)    "c-diff"  . Nsaids Other (See Comments)    Was told to not take  these    Consultations:  Psychiatry  Neurology   Procedures/Studies: CT Code Stroke CTA Head W/WO contrast  Result Date: 10/22/2019 CLINICAL DATA:  Code stroke for aphasia. EXAM: CT ANGIOGRAPHY HEAD AND NECK TECHNIQUE: Multidetector CT imaging of the head and neck was performed using the standard protocol during bolus administration of intravenous contrast. Multiplanar CT image reconstructions and MIPs were obtained to evaluate the vascular anatomy. Carotid stenosis measurements (when applicable) are obtained utilizing NASCET criteria, using the distal internal carotid diameter as the denominator. CONTRAST:  58mL OMNIPAQUE IOHEXOL 350 MG/ML SOLN COMPARISON:  Noncontrast head CT earlier today FINDINGS: CTA NECK FINDINGS Aortic arch: Atheromatous plaque. No acute finding. Three vessel branching. Right carotid system: Calcified plaque at the bifurcation without stenosis or ulceration. Mild undulation of the ICA lumen. Negative for dissection. Left carotid system: Mild calcified plaque at the bifurcation. No stenosis, ulceration, or beading. Vertebral arteries: No proximal subclavian stenosis. Left vertebral artery dominance. The vertebral arteries are smooth and widely patent. Skeleton: Negative Other neck: No evidence of mass or inflammation. Upper chest: Negative Review of the MIP images confirms the above findings CTA HEAD FINDINGS Anterior circulation: Calcified plaque on the carotid siphons without stenosis or major branch occlusion. Negative for aneurysm or beading. Posterior circulation: Left vertebral artery dominance. The vertebral and basilar arteries are widely patent. Arterial elongation without aneurysm. No branch occlusion or proximal stenosis Venous sinuses: Patent as permitted by contrast timing Anatomic variants: As above Review of the MIP images confirms the above findings IMPRESSION: 1. No emergent finding. 2. Undulating appearance of the right cervical ICA, possible mild FMD. 3.  Atherosclerosis without proximal stenosis, mild for age. Electronically Signed   By: Marnee Spring M.D.   On: 10/22/2019 14:43   CT Code Stroke CTA Neck W/WO contrast  Result Date: 10/22/2019 CLINICAL DATA:  Code stroke for aphasia. EXAM: CT ANGIOGRAPHY HEAD AND NECK TECHNIQUE: Multidetector CT imaging of the head and neck was performed using the standard protocol during bolus administration of intravenous contrast. Multiplanar CT image reconstructions and MIPs were obtained to evaluate the vascular anatomy. Carotid stenosis measurements (when applicable) are obtained utilizing NASCET criteria, using the distal internal carotid diameter as the denominator. CONTRAST:  64mL OMNIPAQUE IOHEXOL 350 MG/ML SOLN COMPARISON:  Noncontrast head CT earlier today FINDINGS: CTA NECK FINDINGS Aortic arch: Atheromatous plaque. No acute finding. Three vessel branching. Right carotid system: Calcified plaque at the bifurcation without stenosis or ulceration. Mild undulation of the ICA lumen. Negative for dissection. Left carotid system: Mild  calcified plaque at the bifurcation. No stenosis, ulceration, or beading. Vertebral arteries: No proximal subclavian stenosis. Left vertebral artery dominance. The vertebral arteries are smooth and widely patent. Skeleton: Negative Other neck: No evidence of mass or inflammation. Upper chest: Negative Review of the MIP images confirms the above findings CTA HEAD FINDINGS Anterior circulation: Calcified plaque on the carotid siphons without stenosis or major branch occlusion. Negative for aneurysm or beading. Posterior circulation: Left vertebral artery dominance. The vertebral and basilar arteries are widely patent. Arterial elongation without aneurysm. No branch occlusion or proximal stenosis Venous sinuses: Patent as permitted by contrast timing Anatomic variants: As above Review of the MIP images confirms the above findings IMPRESSION: 1. No emergent finding. 2. Undulating appearance of  the right cervical ICA, possible mild FMD. 3. Atherosclerosis without proximal stenosis, mild for age. Electronically Signed   By: Marnee Spring M.D.   On: 10/22/2019 14:43   MR BRAIN WO CONTRAST  Result Date: 10/22/2019 CLINICAL DATA:  Aphasia. EXAM: MRI HEAD WITHOUT CONTRAST TECHNIQUE: Multiplanar, multiecho pulse sequences of the brain and surrounding structures were obtained without intravenous contrast. COMPARISON:  CT studies same day FINDINGS: Brain: Diffusion imaging does not show any acute or subacute infarction. No focal abnormality affects the brainstem or cerebellum. Cerebral hemispheres show generalized age related atrophy with mild chronic small-vessel ischemic changes of the white matter. No cortical or large vessel territory infarction. No mass lesion, hemorrhage, hydrocephalus or extra-axial collection. Vascular: Major vessels at the base of the brain show flow. Skull and upper cervical spine: Negative Sinuses/Orbits: Clear/normal.  Previous lens implants. Other: None IMPRESSION: No acute or subacute infarction. Age related volume loss. Mild chronic small-vessel change of the cerebral hemispheric white matter. Electronically Signed   By: Paulina Fusi M.D.   On: 10/22/2019 17:16   CT HEAD CODE STROKE WO CONTRAST  Result Date: 10/22/2019 CLINICAL DATA:  Code stroke. Limited stroke symptoms. Aphasia at 9 a.m. today EXAM: CT HEAD WITHOUT CONTRAST TECHNIQUE: Contiguous axial images were obtained from the base of the skull through the vertex without intravenous contrast. COMPARISON:  None. FINDINGS: Brain: No evidence of acute infarction, hemorrhage, hydrocephalus, extra-axial collection or mass lesion/mass effect. Age normal generalized volume loss and white matter low-density. Vascular: Tortuous intracranial vessels. Atheromatous calcification. No convincing hyperdensity. Skull: Normal. Negative for fracture or focal lesion. Sinuses/Orbits: No acute finding.  Bilateral cataract resection  Other: These results were communicated to Dr. Amada Jupiter at 2:01 pmon 8/21/2021by text page via the [facility] messaging system. ASPECTS [facility] Stroke Program Early CT Score) - Ganglionic level infarction (caudate, lentiform nuclei, internal capsule, insula, M1-M3 cortex): 3 - Supraganglionic infarction (M4-M6 cortex): 7 Total score (0-10 with 10 being normal): 10 IMPRESSION: No acute finding. Age congruent senescent findings. Electronically Signed   By: Marnee Spring M.D.   On: 10/22/2019 14:02      Subjective: Patient seen and examined bedside, resting comfortably.  No complaints this morning.  No further hallucinations.  Reports slept well.  Ready for discharge to SNF today.  Denies headache, no visual changes, no chest pain, no palpitations, no shortness of breath, no abdominal pain.,  No weakness, no fatigue, no paresthesias.  Discharge Exam: Vitals:   10/25/19 0431 10/25/19 0803  BP: (!) 142/73 (!) 146/63  Pulse: 66 70  Resp: 16 18  Temp:  (!) 97.5 F (36.4 C)  SpO2: 98% 97%   Vitals:   10/24/19 2006 10/25/19 0003 10/25/19 0431 10/25/19 0803  BP: 131/72 134/77 (!) 142/73 (!) 146/63  Pulse: 72 68 66 70  Resp: 18  16 18   Temp: 98.1 F (36.7 C) 97.9 F (36.6 C)  (!) 97.5 F (36.4 C)  TempSrc: Oral Oral  Oral  SpO2: 96% 97% 98% 97%  Weight:      Height:        General: Pt is alert, awake, not in acute distress Cardiovascular: RRR, S1/S2 +, no rubs, no gallops Respiratory: CTA bilaterally, no wheezing, no rhonchi Abdominal: Soft, NT, ND, bowel sounds + Extremities: no edema, no cyanosis    The results of significant diagnostics from this hospitalization (including imaging, microbiology, ancillary and laboratory) are listed below for reference.     Microbiology: Recent Results (from the past 240 hour(s))  SARS Coronavirus 2 by RT PCR (hospital order, performed in [facility] hospital lab) Nasopharyngeal Nasopharyngeal Swab     Status: None   Collection Time: 10/22/19   6:05 PM   Specimen: Nasopharyngeal Swab  Result Value Ref Range Status   SARS Coronavirus 2 NEGATIVE NEGATIVE Final    Comment: (NOTE) SARS-CoV-2 target nucleic acids are NOT DETECTED.  The SARS-CoV-2 RNA is generally detectable in upper and lower respiratory specimens during the acute phase of infection. The lowest concentration of SARS-CoV-2 viral copies this assay can detect is 250 copies / mL. A negative result does not preclude SARS-CoV-2 infection and should not be used as the sole basis for treatment or other patient management decisions.  A negative result may occur with improper specimen collection / handling, submission of specimen other than nasopharyngeal swab, presence of viral mutation(s) within the areas targeted by this assay, and inadequate number of viral copies (<250 copies / mL). A negative result must be combined with clinical observations, patient history, and epidemiological information.  Fact Sheet for Patients:   BoilerBrush.com.cy  Fact Sheet for Healthcare Providers: https://pope.com/  This test is not yet approved or  cleared by the [place] FDA and has been authorized for detection and/or diagnosis of SARS-CoV-2 by FDA under an Emergency Use Authorization (EUA).  This EUA will remain in effect (meaning this test can be used) for the duration of the COVID-19 declaration under Section 564(b)(1) of the Act, 21 U.S.C. section 360bbb-3(b)(1), unless the authorization is terminated or revoked sooner.  Performed at [facility], [address], [place], [place] [address]   Culture, Urine     Status: Abnormal   Collection Time: 10/23/19 10:22 AM   Specimen: Urine, Clean Catch  Result Value Ref Range Status   Specimen Description URINE, CLEAN CATCH  Final   Special Requests   Final    NONE Performed at [facility], [address] [place], [place] [address]    Culture (A)  Final     30,000 COLONIES/mL MULTIPLE SPECIES PRESENT, SUGGEST RECOLLECTION   Report Status 10/24/2019 FINAL  Final  SARS Coronavirus 2 by RT PCR (hospital order, performed in [facility] hospital lab) Nasopharyngeal Nasopharyngeal Swab     Status: None   Collection Time: 10/24/19  1:29 PM   Specimen: Nasopharyngeal Swab  Result Value Ref Range Status   SARS Coronavirus 2 NEGATIVE NEGATIVE Final    Comment: (NOTE) SARS-CoV-2 target nucleic acids are NOT DETECTED.  The SARS-CoV-2 RNA is generally detectable in upper and lower respiratory specimens during the acute phase of infection. The lowest concentration of SARS-CoV-2 viral copies this assay can detect is 250 copies / mL. A negative result does not preclude SARS-CoV-2 infection and should not be used  as the sole basis for treatment or other patient management decisions.  A negative result may occur with improper specimen collection / handling, submission of specimen other than nasopharyngeal swab, presence of viral mutation(s) within the areas targeted by this assay, and inadequate number of viral copies (<250 copies / mL). A negative result must be combined with clinical observations, patient history, and epidemiological information.  Fact Sheet for Patients:   BoilerBrush.com.cy  Fact Sheet for Healthcare Providers: https://pope.com/  This test is not yet approved or  cleared by the [place] FDA and has been authorized for detection and/or diagnosis of SARS-CoV-2 by FDA under an Emergency Use Authorization (EUA).  This EUA will remain in effect (meaning this test can be used) for the duration of the COVID-19 declaration under Section 564(b)(1) of the Act, 21 U.S.C. section 360bbb-3(b)(1), unless the authorization is terminated or revoked sooner.  Performed at [facility], [address], [place], [place] [address]      Labs: BNP (last 3 results) Recent Labs     06/20/19 0528 06/21/19 0246  BNP 144.3* 330.9*   Basic Metabolic Panel: Recent Labs  Lab 10/22/19 1350 10/22/19 1355 10/23/19 0754 10/24/19 0848 10/25/19 0542  NA 136 137 140 138 141  K 4.2 4.0 4.0 4.1 4.5  CL 104 104 101 105 105  CO2 22  --  28 22 27   GLUCOSE 97 95 143* 143* 91  BUN 16 17 13 14 17   CREATININE 0.77 0.70 0.82 0.93 0.84  CALCIUM 9.3  --  9.7 9.3 9.2   Liver Function Tests: Recent Labs  Lab 10/22/19 1350 10/23/19 0754 10/24/19 0848 10/25/19 0542  AST 18 21 19 17   ALT 17 17 16 16   ALKPHOS 41 40 36* 37*  BILITOT 0.9 0.8 0.8 0.8  PROT 6.7 6.9 6.5 6.1*  ALBUMIN 3.8 3.7 3.5 3.3*   No results for input(s): LIPASE, AMYLASE in the last 168 hours. Recent Labs  Lab 10/22/19 1624  AMMONIA 29   CBC: Recent Labs  Lab 10/22/19 1350 10/22/19 1355 10/23/19 0754 10/24/19 0848 10/25/19 0542  WBC 5.1  --  4.8 4.6 5.0  NEUTROABS 3.2  --  3.2 2.9 2.6  HGB 13.6 13.9 14.7 14.5 13.7  HCT 41.9 41.0 45.9 44.7 42.9  MCV 100.0  --  99.6 99.6 100.5*  PLT 176  --  184 187 170   Cardiac Enzymes: No results for input(s): CKTOTAL, CKMB, CKMBINDEX, TROPONINI in the last 168 hours. BNP: Invalid input(s): POCBNP CBG: No results for input(s): GLUCAP in the last 168 hours. D-Dimer No results for input(s): DDIMER in the last 72 hours. Hgb A1c Recent Labs    10/22/19 1624  HGBA1C 5.4   Lipid Profile Recent Labs    10/22/19 1624  CHOL 187  HDL 83  LDLCALC 91  TRIG 63  CHOLHDL 2.3   Thyroid function studies No results for input(s): TSH, T4TOTAL, T3FREE, THYROIDAB in the last 72 hours.  Invalid input(s): FREET3 Anemia work up Recent Labs    10/23/19 1257  VITAMINB12 189   Urinalysis    Component Value Date/Time   COLORURINE YELLOW 10/23/2019 1045   APPEARANCEUR CLEAR 10/23/2019 1045   LABSPEC 1.019 10/23/2019 1045   PHURINE 6.0 10/23/2019 1045   GLUCOSEU NEGATIVE 10/23/2019 1045   HGBUR SMALL (A) 10/23/2019 1045   BILIRUBINUR NEGATIVE 10/23/2019  1045   KETONESUR NEGATIVE 10/23/2019 1045   PROTEINUR NEGATIVE 10/23/2019 1045   NITRITE NEGATIVE 10/23/2019 1045   LEUKOCYTESUR  MODERATE (A) 10/23/2019 1045   Sepsis Labs Invalid input(s): PROCALCITONIN,  WBC,  LACTICIDVEN Microbiology Recent Results (from the past 240 hour(s))  SARS Coronavirus 2 by RT PCR (hospital order, performed in [facility] hospital lab) Nasopharyngeal Nasopharyngeal Swab     Status: None   Collection Time: 10/22/19  6:05 PM   Specimen: Nasopharyngeal Swab  Result Value Ref Range Status   SARS Coronavirus 2 NEGATIVE NEGATIVE Final    Comment: (NOTE) SARS-CoV-2 target nucleic acids are NOT DETECTED.  The SARS-CoV-2 RNA is generally detectable in upper and lower respiratory specimens during the acute phase of infection. The lowest concentration of SARS-CoV-2 viral copies this assay can detect is 250 copies / mL. A negative result does not preclude SARS-CoV-2 infection and should not be used as the sole basis for treatment or other patient management decisions.  A negative result may occur with improper specimen collection / handling, submission of specimen other than nasopharyngeal swab, presence of viral mutation(s) within the areas targeted by this assay, and inadequate number of viral copies (<250 copies / mL). A negative result must be combined with clinical observations, patient history, and epidemiological information.  Fact Sheet for Patients:   BoilerBrush.com.cy  Fact Sheet for Healthcare Providers: https://pope.com/  This test is not yet approved or  cleared by the [place] FDA and has been authorized for detection and/or diagnosis of SARS-CoV-2 by FDA under an Emergency Use Authorization (EUA).  This EUA will remain in effect (meaning this test can be used) for the duration of the COVID-19 declaration under Section 564(b)(1) of the Act, 21 U.S.C. section 360bbb-3(b)(1), unless the  authorization is terminated or revoked sooner.  Performed at [facility], [address], [place], [place] [address]   Culture, Urine     Status: Abnormal   Collection Time: 10/23/19 10:22 AM   Specimen: Urine, Clean Catch  Result Value Ref Range Status   Specimen Description URINE, CLEAN CATCH  Final   Special Requests   Final    NONE Performed at [facility], [address], [place], [place] [address]    Culture (A)  Final    30,000 COLONIES/mL MULTIPLE SPECIES PRESENT, SUGGEST RECOLLECTION   Report Status 10/24/2019 FINAL  Final  SARS Coronavirus 2 by RT PCR (hospital order, performed in [facility] hospital lab) Nasopharyngeal Nasopharyngeal Swab     Status: None   Collection Time: 10/24/19  1:29 PM   Specimen: Nasopharyngeal Swab  Result Value Ref Range Status   SARS Coronavirus 2 NEGATIVE NEGATIVE Final    Comment: (NOTE) SARS-CoV-2 target nucleic acids are NOT DETECTED.  The SARS-CoV-2 RNA is generally detectable in upper and lower respiratory specimens during the acute phase of infection. The lowest concentration of SARS-CoV-2 viral copies this assay can detect is 250 copies / mL. A negative result does not preclude SARS-CoV-2 infection and should not be used as the sole basis for treatment or other patient management decisions.  A negative result may occur with improper specimen collection / handling, submission of specimen other than nasopharyngeal swab, presence of viral mutation(s) within the areas targeted by this assay, and inadequate number of viral copies (<250 copies / mL). A negative result must be combined with clinical observations, patient history, and epidemiological information.  Fact Sheet for Patients:   BoilerBrush.com.cy  Fact Sheet for Healthcare Providers: https://pope.com/  This test is not yet approved or  cleared by the [place] FDA and has been authorized for detection  and/or diagnosis of SARS-CoV-2 by FDA under an Emergency Use Authorization (EUA).  This EUA will remain in effect (meaning this test can be used) for the duration of the COVID-19 declaration under Section 564(b)(1) of the Act, 21 U.S.C. section 360bbb-3(b)(1), unless the authorization is terminated or revoked sooner.  Performed at [facility], [address] [place], [place] [address]      Time coordinating discharge: Over 30 minutes  SIGNED:   Alvira Philips [place], DO  [facility] 10/25/2019, 10:37 AM

## 2019-10-25 NOTE — Progress Notes (Signed)
Physical Therapy Treatment Patient Details Name: Charlene Dickerson MRN: [id number] DOB: 10-08-1932 Today's Date: 10/25/2019    History of Present Illness Pt is an 86 y.o. F presenting to the hospital with aphasia and a UTI.  MRI of brain was negative for acute infarct.  neurology suspects she has an underlying neurodegenerative process causing impairments in her Lt temporal region. PMH of PAF, CAD s/p STEMI '19, ventricular tachycardia, HTN, and HLD    PT Comments    Patient was received laying in bed at start of physical therapy session. She appeared to be more focused and had good carry-over from last session in terms of keeping RW close while walking and using the RW when turning and transfering back into the bed. Continued to required some physical assistance and verbal cueing to keep both hands on the RW for transfers and during gait training in the hallway. See below for assistance levels. Patient was left sitting up in bed with all needs within reach and her daughter present.     Follow Up Recommendations  SNF;Supervision - Intermittent     Equipment Recommendations  Rolling walker with 5" wheels    Recommendations for Other Services       Precautions / Restrictions Precautions Precautions: Fall Restrictions Weight Bearing Restrictions: No    Mobility  Bed Mobility Overal bed mobility: Modified Independent                Transfers Overall transfer level: Needs assistance Equipment used: Rolling walker (2 wheeled) Transfers: Sit to/from Stand Sit to Stand: Min guard            Ambulation/Gait Ambulation/Gait assistance: Min guard Gait Distance (Feet): 180 Feet Assistive device: Rolling walker (2 wheeled) Gait Pattern/deviations: Step-through pattern;Narrow base of support;Trunk flexed Gait velocity: decreased   General Gait Details: Good carry over from last session to keep the RW close. Needed verbal cueing to keep hands on the RW while ambulating. More  focused today.   Stairs             Wheelchair Mobility    Modified Rankin (Stroke Patients Only)       Balance Overall balance assessment: Needs assistance Sitting-balance support: No upper extremity supported;Feet supported Sitting balance-Leahy Scale: Good     Standing balance support: Bilateral upper extremity supported;During functional activity Standing balance-Leahy Scale: Fair Standing balance comment: reliant on use of BUE's                            Cognition Arousal/Alertness: Awake/alert Behavior During Therapy: WFL for tasks assessed/performed Overall Cognitive Status: Impaired/Different from baseline Area of Impairment: Attention;Memory;Following commands;Safety/judgement;Problem solving                   Current Attention Level: Selective Memory: Decreased short-term memory Following Commands: Follows one step commands consistently Safety/Judgement: Decreased awareness of safety Awareness: Emergent Problem Solving: Slow processing;Requires verbal cues General Comments: Pt is very distractable requiring frequent redirection. Has been having hallucinations at night      Exercises      General Comments        Pertinent Vitals/Pain Pain Assessment: No/denies pain    Home Living                      Prior Function            PT Goals (current goals can now be found in the care plan section) Acute  Rehab PT Goals Patient Stated Goal: to go home to her cat PT Goal Formulation: With patient Time For Goal Achievement: 11/06/19 Potential to Achieve Goals: Good Progress towards PT goals: Progressing toward goals    Frequency    Min 2X/week      PT Plan Current plan remains appropriate    Co-evaluation              AM-PAC PT "6 Clicks" Mobility   Outcome Measure  Help needed turning from your back to your side while in a flat bed without using bedrails?: None Help needed moving from lying on your  back to sitting on the side of a flat bed without using bedrails?: None Help needed moving to and from a bed to a chair (including a wheelchair)?: A Little Help needed standing up from a chair using your arms (e.g., wheelchair or bedside chair)?: A Little Help needed to walk in hospital room?: A Little Help needed climbing 3-5 steps with a railing? : A Lot 6 Click Score: 19    End of Session Equipment Utilized During Treatment: Gait belt Activity Tolerance: Patient tolerated treatment well Patient left: in bed;with family/visitor present Nurse Communication: Mobility status PT Visit Diagnosis: Unsteadiness on feet (R26.81);Other abnormalities of gait and mobility (R26.89);Other symptoms and signs involving the nervous system (R29.898)     Time:  -     Charges:                        Elisha Ponder, SPT, ATC

## 2019-10-25 NOTE — Progress Notes (Signed)
RN called and gave Reuel Boom, RN at [facility] report on the patient. RN placed scripts and discharge AVS in discharge packet and handed it to her daughter to give to facility when she gets there

## 2019-11-08 ENCOUNTER — Telehealth: Payer: Self-pay | Admitting: Nurse Practitioner

## 2019-11-08 NOTE — Telephone Encounter (Signed)
Called patient's daughter, [name], in response to her MyChart advice request. She states patient has returned home after being admitted to [facility] for rehab post hospitalization. She has questions about the patient's cardiac medications. She reviewed the medication list and I answered questions and advised of any changes that were made during hospitalization to which she voiced agreement and understanding. She is scheduling an appointment with PCP, Dr. Clelia Croft, for the next few weeks as advised at hospital discharge. I scheduled her for follow-up with Dr. Elease Hashimoto on 10/5 per Lynn's request. I advised her to call back with additional questions or concerns prior to appointment and she thanked me for the call.

## 2019-11-23 ENCOUNTER — Emergency Department (HOSPITAL_COMMUNITY): Payer: Medicare Other

## 2019-11-23 ENCOUNTER — Emergency Department (HOSPITAL_COMMUNITY)
Admission: EM | Admit: 2019-11-23 | Discharge: 2019-11-23 | Disposition: A | Discharge status: Home / Self Care | Payer: Medicare Other | Attending: Emergency Medicine | Admitting: Emergency Medicine

## 2019-11-23 ENCOUNTER — Encounter (HOSPITAL_COMMUNITY): Payer: Self-pay | Admitting: Emergency Medicine

## 2019-11-23 ENCOUNTER — Other Ambulatory Visit: Payer: Self-pay

## 2019-11-23 DIAGNOSIS — I5032 Chronic diastolic (congestive) heart failure: Secondary | ICD-10-CM | POA: Diagnosis not present

## 2019-11-23 DIAGNOSIS — I10 Essential (primary) hypertension: Secondary | ICD-10-CM | POA: Diagnosis not present

## 2019-11-23 DIAGNOSIS — R251 Tremor, unspecified: Secondary | ICD-10-CM | POA: Insufficient documentation

## 2019-11-23 DIAGNOSIS — Z9861 Coronary angioplasty status: Secondary | ICD-10-CM | POA: Insufficient documentation

## 2019-11-23 DIAGNOSIS — I2510 Atherosclerotic heart disease of native coronary artery without angina pectoris: Secondary | ICD-10-CM | POA: Insufficient documentation

## 2019-11-23 DIAGNOSIS — Z79899 Other long term (current) drug therapy: Secondary | ICD-10-CM | POA: Insufficient documentation

## 2019-11-23 DIAGNOSIS — Z7901 Long term (current) use of anticoagulants: Secondary | ICD-10-CM | POA: Diagnosis not present

## 2019-11-23 DIAGNOSIS — R030 Elevated blood-pressure reading, without diagnosis of hypertension: Secondary | ICD-10-CM

## 2019-11-23 DIAGNOSIS — R29818 Other symptoms and signs involving the nervous system: Secondary | ICD-10-CM | POA: Diagnosis not present

## 2019-11-23 DIAGNOSIS — Z87891 Personal history of nicotine dependence: Secondary | ICD-10-CM | POA: Diagnosis not present

## 2019-11-23 LAB — PROTIME-INR
INR: 1.2 (ref 0.8–1.2)
Prothrombin Time: 14.6 seconds (ref 11.4–15.2)

## 2019-11-23 LAB — COMPREHENSIVE METABOLIC PANEL
ALT: 19 U/L (ref 0–44)
AST: 20 U/L (ref 15–41)
Albumin: 3.9 g/dL (ref 3.5–5.0)
Alkaline Phosphatase: 46 U/L (ref 38–126)
Anion gap: 10 (ref 5–15)
BUN: 13 mg/dL (ref 8–23)
CO2: 26 mmol/L (ref 22–32)
Calcium: 9.3 mg/dL (ref 8.9–10.3)
Chloride: 103 mmol/L (ref 98–111)
Creatinine, Ser: 0.8 mg/dL (ref 0.44–1.00)
GFR calc Af Amer: >60 mL/min (ref >60)
GFR calc non Af Amer: >60 mL/min (ref >60)
Glucose, Bld: 95 mg/dL (ref 70–99)
Potassium: 3.9 mmol/L (ref 3.5–5.1)
Sodium: 139 mmol/L (ref 135–145)
Total Bilirubin: 1 mg/dL (ref 0.3–1.2)
Total Protein: 6.6 g/dL (ref 6.5–8.1)

## 2019-11-23 LAB — DIFFERENTIAL
Abs Immature Granulocytes: 0.01 10*3/uL (ref 0.00–0.07)
Basophils Absolute: 0 10*3/uL (ref 0.0–0.1)
Basophils Relative: 1 %
Eosinophils Absolute: 0.1 10*3/uL (ref 0.0–0.5)
Eosinophils Relative: 1 %
Immature Granulocytes: 0 %
Lymphocytes Relative: 20 %
Lymphs Abs: 0.9 10*3/uL (ref 0.7–4.0)
Monocytes Absolute: 0.4 10*3/uL (ref 0.1–1.0)
Monocytes Relative: 11 %
Neutro Abs: 2.8 10*3/uL (ref 1.7–7.7)
Neutrophils Relative %: 67 %

## 2019-11-23 LAB — APTT: aPTT: 34 seconds (ref 24–36)

## 2019-11-23 LAB — CBC
HCT: 45.5 % (ref 36.0–46.0)
Hemoglobin: 14.9 g/dL (ref 12.0–15.0)
MCH: 32.4 pg (ref 26.0–34.0)
MCHC: 32.7 g/dL (ref 30.0–36.0)
MCV: 98.9 fL (ref 80.0–100.0)
Platelets: 169 10*3/uL (ref 150–400)
RBC: 4.6 MIL/uL (ref 3.87–5.11)
RDW: 12.7 % (ref 11.5–15.5)
WBC: 4.2 10*3/uL (ref 4.0–10.5)
nRBC: 0 % (ref 0.0–0.2)

## 2019-11-23 MED ORDER — LABETALOL HCL 5 MG/ML IV SOLN
10.0000 mg | Freq: Once | INTRAVENOUS | Status: DC
  Filled 2019-11-23: qty 4

## 2019-11-23 MED ORDER — HYDRALAZINE HCL 20 MG/ML IJ SOLN
10.0000 mg | Freq: Once | INTRAMUSCULAR | Status: AC
  Administered 2019-11-23: 10 mg via INTRAVENOUS
  Filled 2019-11-23: qty 1

## 2019-11-23 MED ORDER — IOHEXOL 350 MG/ML SOLN
75.0000 mL | Freq: Once | INTRAVENOUS | Status: AC | PRN
  Administered 2019-11-23: 75 mL via INTRAVENOUS

## 2019-11-23 MED ORDER — SODIUM CHLORIDE 0.9% FLUSH
3.0000 mL | Freq: Once | INTRAVENOUS | Status: AC
  Administered 2019-11-23: 3 mL via INTRAVENOUS

## 2019-11-23 MED ORDER — HYDRALAZINE HCL 10 MG PO TABS: 10.0000 mg | ORAL_TABLET | Freq: Three times a day (TID) | ORAL | 0 refills | Status: AC | PRN

## 2019-11-23 NOTE — ED Triage Notes (Signed)
Pt to triage via [facility] from home.  Reports L sided facial numbness that lasted 15 min and has resolved.  No neuro deficits noted on triage.  20g R FA.

## 2019-11-23 NOTE — ED Notes (Signed)
Patient transported to CT 

## 2019-11-23 NOTE — ED Provider Notes (Signed)
[facility] EMERGENCY DEPARTMENT Provider Note   CSN: [id number] Arrival date & time: 11/23/19  1206     History Chief Complaint  Patient presents with  . Transient Ischemic Attack    Charlene Dickerson is a 86 y.o. female.  The history is provided by the patient and medical records. No language interpreter was used.  Neurologic Problem This is a new problem. The current episode started 6 to 12 hours ago. The problem occurs rarely. The problem has been resolved. Associated symptoms include headaches (moderate). Pertinent negatives include no chest pain, no abdominal pain and no shortness of breath. Nothing aggravates the symptoms. Nothing relieves the symptoms. She has tried nothing for the symptoms. The treatment provided no relief.       Past Medical History:  Diagnosis Date  . Diverticulosis of colon   . Essential tremor   . GERD (gastroesophageal reflux disease)    occasional (no meds)  . History of basal cell carcinoma excision 2012  . Hypertension   . Osteoporosis   . PONV (postoperative nausea and vomiting)   . Rectocele    symptomatic    Patient Active Problem List   Diagnosis Date Noted  . 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 Ventricular tachycardia (HCC) 07/15/2019  . Malnutrition of moderate degree 06/21/2019  . AKI (acute kidney injury) (HCC) 06/20/2019  . Hyperkalemia 06/20/2019  . Metabolic acidosis 06/20/2019  . Uremia 06/20/2019  . Closed compression fracture of L4 lumbar vertebra, initial encounter (HCC) 06/20/2019  . CAD (coronary artery disease) 06/20/2019  . Dehydration 06/20/2019  . Chronic diastolic CHF (congestive heart failure) (HCC) 06/20/2019  . Essential hypertension 06/20/2019  . Low back pain 06/01/2019  . Paroxysmal atrial fibrillation (HCC)   . Persistent atrial fibrillation (HCC) 03/12/2018  . Shock circulatory (HCC)   . Hemodynamic instability   . Central venous catheter in place   . STEMI (ST elevation myocardial  infarction) (HCC) 02/18/2018  . Acute myocardial infarction (HCC) 02/18/2018  . Complete heart block (HCC)   . Rectocele 11/26/2017  . Cystocele with rectocele 11/26/2017  . Diffuse cystic mastopathy of breast 05/09/2011    Past Surgical History:  Procedure Laterality Date  . BREAST EXCISIONAL BIOPSY Left 1995  . BREAST EXCISIONAL BIOPSY Right 1988  . CARDIOVERSION N/A 04/16/2018   Procedure: CARDIOVERSION;  Surgeon: Parke Poisson, MD;  Location: [facility] ENDOSCOPY;  Service: Cardiovascular;  Laterality: N/A;  . CATARACT EXTRACTION W/ INTRAOCULAR LENS  IMPLANT, BILATERAL  right 09/ 2013;  left 10/ 2013  . COLONOSCOPY  last one 04/10/ 2012  . CORONARY STENT INTERVENTION N/A 02/18/2018   Procedure: CORONARY STENT INTERVENTION;  Surgeon: Runell Gess, MD;  Location: [facility] INVASIVE CV LAB;  Service: Cardiovascular;  Laterality: N/A;  . D & C HYSTEROSCOPY W/ RESECTION ENDOMETRIAL POLYP AND LESION  07-27-2007   dr Ambrose Mantle @[facility]   . IR KYPHO LUMBAR INC FX REDUCE BONE BX UNI/BIL CANNULATION INC/IMAGING  07/11/2019  . LEFT HEART CATH AND CORONARY ANGIOGRAPHY N/A 02/18/2018   Procedure: LEFT HEART CATH AND CORONARY ANGIOGRAPHY;  Surgeon: 02/20/2018, MD;  Location: [facility] INVASIVE CV LAB;  Service: Cardiovascular;  Laterality: N/A;  . RECTOCELE REPAIR N/A 11/26/2017   Procedure: POSTERIOR REPAIR (RECTOCELE);  Surgeon: 11/28/2017, MD;  Location: [facility];  Service: Gynecology;  Laterality: N/A;  OUTPT IN BED  . TEMPORARY PACEMAKER N/A 02/18/2018   Procedure: TEMPORARY PACEMAKER;  Surgeon: 02/20/2018, MD;  Location: [facility] INVASIVE CV LAB;  Service:  Cardiovascular;  Laterality: N/A;  . TUBAL LIGATION Bilateral yrs ago     OB History   No obstetric history on file.     Family History  Problem Relation Age of Onset  . Cancer Sister        Breast  . Heart disease Sister   . Breast cancer Sister   . Heart disease Father   . Cancer Daughter        breast  . Breast  cancer Daughter   . Cancer Maternal Aunt        breast  . Breast cancer Maternal Aunt   . Cancer Maternal Grandmother        breast  . Breast cancer Maternal Grandmother     Social History   Tobacco Use  . Smoking status: Former Smoker    Quit date: 05/08/1961    Years since quitting: 58.5  . Smokeless tobacco: Never Used  Vaping Use  . Vaping Use: Never used  Substance Use Topics  . Alcohol use: No  . Drug use: Never    Home Medications Prior to Admission medications   Medication Sig Start Date End Date Taking? Authorizing Provider  acetaminophen (TYLENOL) 325 MG tablet Take 2 tablets (650 mg total) by mouth every 4 (four) hours as needed for mild pain (or temp > 37.5 C (99.5 F)). 10/25/19 11/24/19  [place], Eric J, DO  ALPRAZolam Prudy Feeler) 0.5 MG tablet Take 1 tablet (0.5 mg total) by mouth 3 (three) times daily as needed for anxiety. 10/25/19 11/24/19  [place], Alvira Philips, DO  apixaban (ELIQUIS) 5 MG TABS tablet Take 1 tablet (5 mg total) by mouth 2 (two) times daily. 10/25/19 11/24/19  [place], Alvira Philips, DO  atorvastatin (LIPITOR) 40 MG tablet Take 1 tablet (40 mg total) by mouth daily with supper. 10/25/19 11/24/19  [place], Alvira Philips, DO  calcium citrate (CALCITRATE - DOSED IN MG ELEMENTAL CALCIUM) 950 MG tablet Take 200 mg of elemental calcium by mouth daily.    [provider]  docusate sodium (COLACE) 100 MG capsule Take 100 mg by mouth daily as needed for mild constipation.     [provider]  feeding supplement, ENSURE ENLIVE, (ENSURE ENLIVE) LIQD Take 237 mLs by mouth 2 (two) times daily between meals. 06/24/19   Rolly Salter, MD  furosemide (LASIX) 20 MG tablet Take 0.5 tablets (10 mg total) by mouth in the morning. 10/26/19 11/25/19  [place], Alvira Philips, DO  latanoprost (XALATAN) 0.005 % ophthalmic solution Place 1 drop into both eyes at bedtime.  04/17/11   [provider]  loratadine (CLARITIN) 10 MG tablet Take 1 tablet (10 mg total) by mouth daily. 10/25/19  11/24/19  [place], Eric J, DO  Multiple Vitamin (MULTIVITAMIN WITH MINERALS) TABS tablet Take 1 tablet by mouth daily.    [provider]  nitroGLYCERIN (NITROSTAT) 0.4 MG SL tablet Place 1 tablet (0.4 mg total) under the tongue every 5 (five) minutes as needed for chest pain. 10/25/19 11/24/19  [place], Alvira Philips, DO  pantoprazole (PROTONIX) 40 MG tablet Take 1 tablet (40 mg total) by mouth at bedtime. 10/25/19 11/24/19  [place], Alvira Philips, DO  propranolol (INDERAL) 10 MG tablet Take 1 tablet (10 mg total) by mouth 2 (two) times daily with a meal. 10/25/19 11/24/19  [place], Eric J, DO  PSYLLIUM PO Take 1 capsule by mouth 2 (two) times daily.    [provider]  QUEtiapine (SEROQUEL) 25 MG tablet Take 1 tablet (25 mg total) by  mouth at bedtime. 10/25/19 11/24/19  [place], Eric J, DO  senna-docusate (SENOKOT-S) 8.6-50 MG tablet Take 1 tablet by mouth at bedtime as needed for moderate constipation. 10/25/19 11/24/19  [place], Alvira Philips, DO  vitamin B-12 1000 MCG tablet Take 1 tablet (1,000 mcg total) by mouth daily. 10/26/19 11/25/19  [place], Eric J, DO  Vitamin D, Ergocalciferol, (DRISDOL) 1.25 MG (50000 UNIT) CAPS capsule Take 1 capsule (50,000 Units total) by mouth every Wednesday. 10/26/19 11/25/19  [place], Eric J, DO    Allergies    Clindamycin/lincomycin and Nsaids  Review of Systems   Review of Systems  Constitutional: Negative for chills, diaphoresis, fatigue and fever.  HENT: Negative for congestion.   Eyes: Positive for visual disturbance (resolved). Negative for photophobia.  Respiratory: Negative for cough, chest tightness and shortness of breath.   Cardiovascular: Negative for chest pain.  Gastrointestinal: Negative for abdominal pain, constipation, diarrhea, nausea and vomiting.  Genitourinary: Negative for dysuria and flank pain.  Musculoskeletal: Positive for neck pain. Negative for back pain and neck stiffness.  Skin: Negative for rash and wound.  Neurological: Positive for  tremors (at baseline), weakness, numbness and headaches (moderate). Negative for dizziness, seizures, speech difficulty and light-headedness.  Psychiatric/Behavioral: Negative for agitation and confusion.  All other systems reviewed and are negative.   Physical Exam Updated Vital Signs BP (!) 162/78   Pulse 61   Temp 98.2 F (36.8 C) (Oral)   Resp 15   SpO2 97%   Physical Exam Vitals and nursing note reviewed.  Constitutional:      General: She is not in acute distress.    Appearance: She is well-developed. She is not ill-appearing, toxic-appearing or diaphoretic.  HENT:     Head: Normocephalic and atraumatic.     Nose: No congestion or rhinorrhea.     Mouth/Throat:     Mouth: Mucous membranes are dry.     Pharynx: No oropharyngeal exudate or posterior oropharyngeal erythema.  Eyes:     Extraocular Movements: Extraocular movements intact.     Conjunctiva/sclera: Conjunctivae normal.     Pupils: Pupils are equal, round, and reactive to light.  Neck:     Vascular: No carotid bruit.  Cardiovascular:     Rate and Rhythm: Normal rate and regular rhythm.     Heart sounds: No murmur heard.   Pulmonary:     Effort: Pulmonary effort is normal. No respiratory distress.     Breath sounds: Normal breath sounds. No wheezing, rhonchi or rales.  Chest:     Chest wall: No tenderness.  Abdominal:     General: Abdomen is flat.     Palpations: Abdomen is soft.     Tenderness: There is no abdominal tenderness. There is no right CVA tenderness, left CVA tenderness, guarding or rebound.  Musculoskeletal:        General: No tenderness.     Cervical back: Neck supple. No rigidity or tenderness.     Right lower leg: No edema.     Left lower leg: No edema.  Skin:    General: Skin is warm and dry.     Capillary Refill: Capillary refill takes less than 2 seconds.     Findings: No erythema or rash.  Neurological:     General: No focal deficit present.     Mental Status: She is alert. Mental  status is at baseline.     GCS: GCS eye subscore is 4. GCS verbal subscore is 5. GCS motor subscore is 6.  Cranial Nerves: No cranial nerve deficit, dysarthria or facial asymmetry.     Sensory: No sensory deficit.     Motor: Tremor present. No weakness, abnormal muscle tone or seizure activity.     Coordination: Coordination normal. Finger-Nose-Finger Test normal.  Psychiatric:        Mood and Affect: Mood normal.     ED Results / Procedures / Treatments   Labs (all labs ordered are listed, but only abnormal results are displayed) Labs Reviewed  PROTIME-INR  APTT  CBC  DIFFERENTIAL  COMPREHENSIVE METABOLIC PANEL    EKG None  Radiology CT Angio Head W or Wo Contrast  Result Date: 11/23/2019 CLINICAL DATA:  Initial evaluation for TIA, right neck and occipital pain, speech difficulty, facial droop. EXAM: CT ANGIOGRAPHY HEAD AND NECK TECHNIQUE: Multidetector CT imaging of the head and neck was performed using the standard protocol during bolus administration of intravenous contrast. Multiplanar CT image reconstructions and MIPs were obtained to evaluate the vascular anatomy. Carotid stenosis measurements (when applicable) are obtained utilizing NASCET criteria, using the distal internal carotid diameter as the denominator. CONTRAST:  75mL OMNIPAQUE IOHEXOL 350 MG/ML SOLN COMPARISON:  Prior CT and MRI from earlier same day as well as prior CTA from 10/22/2019. FINDINGS: CTA NECK FINDINGS Aortic arch: Visualized aortic arch of normal caliber with normal 3 vessel morphology. Mild atheromatous change without flow-limiting stenosis. Right carotid system: Mild atheromatous plaque at the right bifurcation without significant stenosis. Right ICA tortuous with mild undulation, which could reflect a degree of underlying mild F in the as before. No stenosis or dissection. Left carotid system: Mild atheromatous plaque at the left bifurcation without stenosis. Left ICA mildly tortuous but widely patent  without stenosis or dissection. Vertebral arteries: No proximal subclavian artery stenosis. Left vertebral artery dominant. Mild diffuse tortuosity without stenosis or dissection. Skeleton: No acute osseous finding. No worrisome osseous lesions. Disc osteophyte complex at C6-7 with resultant mild to moderate stenosis. Other neck: No other acute abnormality within the neck. No mass or adenopathy. Few scattered subcentimeter hypodense thyroid nodules noted measuring up to 7 mm, felt to be of no significance given size and patient age. No follow-up imaging recommended regarding these lesions. Upper chest: Negative. Review of the MIP images confirms the above findings CTA HEAD FINDINGS Anterior circulation: Mild atheromatous change within the carotid siphons without flow limiting stenosis. A1 segments widely patent. Normal anterior communicating artery complex. Anterior cerebral arteries widely patent. No M1 stenosis or occlusion. Distal MCA branches well perfused and symmetric. Posterior circulation: Both vertebral arteries widely patent to the vertebrobasilar junction. Left vertebral artery dominant. Both picas patent. Basilar widely patent without stenosis. Basilar tip ectatic without focal aneurysm. Superior cerebellar and posterior cerebral arteries widely patent bilaterally. Venous sinuses: Grossly patent allowing for timing the contrast bolus. Anatomic variants: None significant. Review of the MIP images confirms the above findings IMPRESSION: 1. Negative CTA for large vessel occlusion. 2. Mild for age atheromatous change involving the major arterial vasculature of the head and neck without hemodynamically significant or correctable stenosis. 3. Diffuse tortuosity of the major arterial vasculature, suggesting chronic underlying hypertension. Question mild FMD involving the cervical right ICA. No dissection. 4. Disc osteophyte complex at C6-7 with resultant mild to moderate stenosis. Electronically Signed   By:  Rise Mu M.D.   On: 11/23/2019 20:31   CT HEAD WO CONTRAST  Result Date: 11/23/2019 CLINICAL DATA:  Acute neuro deficit.  Left facial numbness. EXAM: CT HEAD WITHOUT CONTRAST TECHNIQUE: Contiguous axial images  were obtained from the base of the skull through the vertex without intravenous contrast. COMPARISON:  CT head 10/22/2019 FINDINGS: Brain: Mild cortical atrophy, typical for age. Patchy white matter hypodensity bilaterally unchanged from the prior study. Negative for acute infarct, hemorrhage, mass Vascular: Negative for hyperdense vessel Skull: Negative Sinuses/Orbits: Paranasal sinuses clear. Bilateral cataract extraction Other: None IMPRESSION: No acute abnormality no change from the prior study Atrophy and chronic microvascular ischemic change in the white matter. Electronically Signed   By: Marlan Palau M.D.   On: 11/23/2019 13:27   CT Angio Neck W and/or Wo Contrast  Result Date: 11/23/2019 CLINICAL DATA:  Initial evaluation for TIA, right neck and occipital pain, speech difficulty, facial droop. EXAM: CT ANGIOGRAPHY HEAD AND NECK TECHNIQUE: Multidetector CT imaging of the head and neck was performed using the standard protocol during bolus administration of intravenous contrast. Multiplanar CT image reconstructions and MIPs were obtained to evaluate the vascular anatomy. Carotid stenosis measurements (when applicable) are obtained utilizing NASCET criteria, using the distal internal carotid diameter as the denominator. CONTRAST:  75mL OMNIPAQUE IOHEXOL 350 MG/ML SOLN COMPARISON:  Prior CT and MRI from earlier same day as well as prior CTA from 10/22/2019. FINDINGS: CTA NECK FINDINGS Aortic arch: Visualized aortic arch of normal caliber with normal 3 vessel morphology. Mild atheromatous change without flow-limiting stenosis. Right carotid system: Mild atheromatous plaque at the right bifurcation without significant stenosis. Right ICA tortuous with mild undulation, which could  reflect a degree of underlying mild F in the as before. No stenosis or dissection. Left carotid system: Mild atheromatous plaque at the left bifurcation without stenosis. Left ICA mildly tortuous but widely patent without stenosis or dissection. Vertebral arteries: No proximal subclavian artery stenosis. Left vertebral artery dominant. Mild diffuse tortuosity without stenosis or dissection. Skeleton: No acute osseous finding. No worrisome osseous lesions. Disc osteophyte complex at C6-7 with resultant mild to moderate stenosis. Other neck: No other acute abnormality within the neck. No mass or adenopathy. Few scattered subcentimeter hypodense thyroid nodules noted measuring up to 7 mm, felt to be of no significance given size and patient age. No follow-up imaging recommended regarding these lesions. Upper chest: Negative. Review of the MIP images confirms the above findings CTA HEAD FINDINGS Anterior circulation: Mild atheromatous change within the carotid siphons without flow limiting stenosis. A1 segments widely patent. Normal anterior communicating artery complex. Anterior cerebral arteries widely patent. No M1 stenosis or occlusion. Distal MCA branches well perfused and symmetric. Posterior circulation: Both vertebral arteries widely patent to the vertebrobasilar junction. Left vertebral artery dominant. Both picas patent. Basilar widely patent without stenosis. Basilar tip ectatic without focal aneurysm. Superior cerebellar and posterior cerebral arteries widely patent bilaterally. Venous sinuses: Grossly patent allowing for timing the contrast bolus. Anatomic variants: None significant. Review of the MIP images confirms the above findings IMPRESSION: 1. Negative CTA for large vessel occlusion. 2. Mild for age atheromatous change involving the major arterial vasculature of the head and neck without hemodynamically significant or correctable stenosis. 3. Diffuse tortuosity of the major arterial vasculature,  suggesting chronic underlying hypertension. Question mild FMD involving the cervical right ICA. No dissection. 4. Disc osteophyte complex at C6-7 with resultant mild to moderate stenosis. Electronically Signed   By: Rise Mu M.D.   On: 11/23/2019 20:31   MR BRAIN WO CONTRAST  Result Date: 11/23/2019 CLINICAL DATA:  TIA. Left facial droop. Right occipital headache. Blurred vision. EXAM: MRI HEAD WITHOUT CONTRAST TECHNIQUE: Multiplanar, multiecho pulse sequences of the brain and  surrounding structures were obtained without intravenous contrast. COMPARISON:  MRI head 10/22/2019.  CT head 11/23/2019 FINDINGS: Brain: Mild atrophy. Negative for hydrocephalus. Mild white matter changes consistent with chronic microvascular ischemia. Negative for acute infarct.  Negative for hemorrhage or mass. Vascular: Normal arterial flow voids Skull and upper cervical spine: Negative Sinuses/Orbits: Paranasal sinuses clear. Bilateral cataract extraction Other: None IMPRESSION: No acute abnormality Mild atrophy and mild chronic microvascular ischemic change in the white matter. Electronically Signed   By: Marlan Palau M.D.   On: 11/23/2019 19:11    Procedures Procedures (including critical care time)    Medications Ordered in ED Medications  sodium chloride flush (NS) 0.9 % injection 3 mL (3 mLs Intravenous Given 11/23/19 1649)  hydrALAZINE (APRESOLINE) injection 10 mg (10 mg Intravenous Given 11/23/19 1726)  iohexol (OMNIPAQUE) 350 MG/ML injection 75 mL (75 mLs Intravenous Contrast Given 11/23/19 1947)    ED Course  I have reviewed the triage vital signs and the nursing notes.  Pertinent labs & imaging results that were available during my care of the patient were reviewed by me and considered in my medical decision making (see chart for details).    MDM Rules/Calculators/A&P                          ZIONAH CRISWELL is a 86 y.o. female with a past medical history significant for persistent atrial  fibrillation on Eliquis therapy, CAD, CHF, hypertension, chronic essential tremor, and recent admission last month for aphasia who presents with elevated blood pressure and concern for TIA.  According to patient, she woke up normal this morning.  She took her blood pressure medication of losartan and then checked her blood pressure.  Her blood pressure was over 200 systolic.  Patient then noticed that she was having left facial droop and was having difficulty swallowing.  She denied difficulty speaking this time but she did report some blurry vision.  She also reports she had some facial numbness and some right-sided occipital and neck type headache.  She reports it lasted for approximately 15 minutes before going away.  When patient saw her physical therapist later today, they told her to come in for possible TIA.  Of note, the patient's recent work-up showed that she does have some carotid disease on the right side.  Patient reports no other preceding symptoms including no recent fevers, chills, dyspnea, cough, nausea, vomiting, constipation, diarrhea, or urinary symptoms.  She denies any recent chest pain, palpitations, shortness of breath.  She denies abdominal pain.  She denies any extremity symptoms.  She denies recent trauma.  On exam, lungs are clear and chest is nontender.  Abdomen is nontender.  No focal neurologic deficits-essential tremor which she reports that her baseline.  No sensation abnormalities, or strength problem with her arms and legs.  No facial droop seen.  Normal extraocular movements.  I did not appreciate a carotid bruit.  No neck tenderness.  Patient otherwise well-appearing at her baseline.  We will touch base with neurology given the recurrent neurologic deficit.  I am concerned somewhat about a hypertensive urgency versus dissection given the pain in her neck and head.    We will give some blood pressure medicine to help with the elevated blood pressure of 200 systolic.  Her  heart rate is around 60, will give hydralazine instead of labetalol at this time.  Spoke with neurology after a noncontrast head CT was performed in triage that did  not show any acute evidence of bleed or stroke.  Neurology recommended MRI as well as CTA head and neck to look for dissection given the neck and headache and MRI to look for possible stroke.  MRI showed no acute stroke and CTA did not show dissection and continues show some tortuosity in the vasculature.  After hydralazine, blood pressure has drastically improved.  We will reassess patient to determine disposition.  Patient is feeling much better and has no further symptoms.  Headache is also improved.  The hydralazine helped and the blood pressure is now in the 140s.  We went through all of her work-up and is reassuring.  We offered admission for blood pressure titration and management versus getting a short prescription of hydralazine to use as needed if her blood pressure is spiking up and she would rather try this and be discharged to follow-up with her PCP and outpatient team.  Patient understood return precautions and family did as well.  Patient continued to feel well and agreed with discharge.  Patient we discharged with prescription for hydralazine and instructions to call PCP tomorrow for further blood pressure management.   Final Clinical Impression(s) / ED Diagnoses Final diagnoses:  Elevated blood pressure reading  Transient neurological symptoms    Rx / DC Orders ED Discharge Orders         Ordered    hydrALAZINE (APRESOLINE) 10 MG tablet  3 times daily PRN        11/23/19 2103          Clinical Impression: 1. Elevated blood pressure reading   2. Transient neurological symptoms     Disposition: Discharge  Condition: Good  I have discussed the results, Dx and Tx plan with the pt(& family if present). He/she/they expressed understanding and agree(s) with the plan. Discharge instructions discussed at  great length. Strict return precautions discussed and pt &/or family have verbalized understanding of the instructions. No further questions at time of discharge.    New Prescriptions   HYDRALAZINE (APRESOLINE) 10 MG TABLET    Take 1 tablet (10 mg total) by mouth 3 (three) times daily as needed (significantly elevated Blood pressures).    Follow Up: Martha Clan, MD [address] [place] [address] [phone]     [facility] EMERGENCY DEPARTMENT [address] [id number] mc [place] [address] [phone]       Promise Weldin, Canary Brim, MD 11/23/19 2106

## 2019-11-23 NOTE — Discharge Instructions (Signed)
Your history and exam today initially was concerning for TIA however your symptoms may have been brought on by the elevated blood pressures this morning.  We monitored you for nearly 9 hours tonight with no further neurologic deficits and your work-up was very reassuring.  Your blood pressure responded well to the hydralazine.  Please use your home blood pressure medicine and fill the prescription for hydralazine to use if her blood pressure starts getting significantly elevated again.  Please call your doctor tomorrow to discuss further blood pressure medication titration and management.  We had a shared decision-making conversation about possible admission however given your well appearance for many hours, we do feel it is reasonable to try going home to treat this as an outpatient.  If any symptoms return, change, or worsen, please return to the nearest emergency department immediately.

## 2019-11-29 ENCOUNTER — Other Ambulatory Visit: Payer: Self-pay | Admitting: Internal Medicine

## 2019-11-29 DIAGNOSIS — M810 Age-related osteoporosis without current pathological fracture: Secondary | ICD-10-CM

## 2019-12-06 ENCOUNTER — Ambulatory Visit (INDEPENDENT_AMBULATORY_CARE_PROVIDER_SITE_OTHER): Payer: Medicare Other | Admitting: Cardiovascular Disease

## 2019-12-06 ENCOUNTER — Other Ambulatory Visit: Payer: Self-pay

## 2019-12-06 ENCOUNTER — Encounter: Payer: Self-pay | Admitting: Cardiovascular Disease

## 2019-12-06 VITALS — BP 134/80 | HR 68 | Ht 62.0 in | Wt 148.8 lb

## 2019-12-06 DIAGNOSIS — I10 Essential (primary) hypertension: Secondary | ICD-10-CM | POA: Diagnosis not present

## 2019-12-06 DIAGNOSIS — I4819 Other persistent atrial fibrillation: Secondary | ICD-10-CM

## 2019-12-06 DIAGNOSIS — I5032 Chronic diastolic (congestive) heart failure: Secondary | ICD-10-CM | POA: Diagnosis not present

## 2019-12-06 NOTE — Patient Instructions (Signed)
Medication Instructions:  Your provider recommends that you continue on your current medications as directed. Please refer to the Current Medication list given to you today.   *If you need a refill on your cardiac medications before your next appointment, please call your pharmacy*  Follow-Up: At [facility], you and your health needs are our priority.  As part of our continuing mission to provide you with exceptional heart care, we have created designated Provider Care Teams.  These Care Teams include your primary Cardiologist (physician) and Advanced Practice Providers (APPs -  Physician Assistants and Nurse Practitioners) who all work together to provide you with the care you need, when you need it. Your next appointment:   6 month(s) The format for your next appointment:   In Person Provider:   You may see Philip Nahser, MD or one of the following Advanced Practice Providers on your designated Care Team:    Scott Weaver, PA-C  Vin Bhagat, PA-C   

## 2019-12-06 NOTE — Progress Notes (Signed)
Cardiology Office Note:    Date:  12/06/2019   ID:  Charlene Dickerson, DOB [birth year], MRN [id number]  PCP:  Martha Clan, MD  Cardiologist:  Kristeen Miss, MD  Electrophysiologist:  None   Referring MD: Martha Clan, MD   Chief Complaint  Patient presents with   Hypertension   Coronary Artery Disease     Jan. 10, 2020   Charlene Dickerson is a 86 y.o. female with a recent admission to the hospital with an acute inferior wall myocardial infarction.  Was complicated by right ventricular infarction.  Was seen with Charlene Dickerson, ( daughter)   She initially had lots of problems with hypotension and required Levophed for several days while in hospital.  But has made a great recovery.   Also had paroxysmal atrial fibrillation as a complication of her heart attack.  She converted back to sinus rhythm on December 24    She is now feeling quite a bit better. No syncope or presyncope.  She is not had any episodes of angina.  April 12, 2018: See seen back today for follow-up visit.  She has a history of an inferior wall myocardial infarction complicated by right ventricular myocardial infarction.  She is developed atrial fibrillation. Gaining weight and her blood pressures been elevated.  We started her on furosemide 20 mg a day . Marland Kitchen  Swelling has improved significantly after starting Lasix but she still very uncomfortable. In discussing with her daughter, Charlene Dickerson , it is clear that Charlene Dickerson is not putting out much urine even on the Lasix.  Feb. 22, 2021  Charlene Dickerson was seen a year ago  She has a hx of Inf. MI with RV infarction.  made a nice recovery   She has RV CHF and atrial fib She had a successful cardioversion in Feb. 2020 She had a telemedicine visit with Tereso Newcomer, PA in August, 2020  Seemed to be doing well  She had some volume excess and her lasix was increased to 40 BID  Rare episodes of dyspnea with walking . No cp  Still has some leg swelling    Some leg swelling .   Is on  lasix 20 bid,  Has frequent urination   Has ad 1st covid vaccine.     Jul 15, 2019:  Charlene Dickerson is seen today for follow-up visit.  She was hospitalized in mid April with generalized weakness.  She had a UTI , developed acute renal failure , hyperkalemia and then ventricular tachycardia.   While in the emergency room she developed wide-complex tachycardia that is consistent with ventricular tachycardia.  Her potassium was 7.0 at the time.  She had acute renal failure with a creatinine of 5.8.  She is she was successfully cardioverted.  She was bagged briefly but did not lose a pulse.  No CP   She is developed some mild leg edema.  While she was in rehab she was started on Lasix 10 mg a day on Mondays Wednesdays and Fridays.  This seems to control her Lasix.  Her creatinine and potassium seem to be well controlled on this regimen. Echocardiogram from June 20, 2019 reveals normal left ventricular systolic function.  She has grade 1 diastolic dysfunction.  RV function is normal.  PA pressures are at the upper limits of normal.  She has moderate aortic insufficiency.  Oct.  5, 2021:  Seen with Daughter, Charlene Dickerson.  She was in the hosptial for HTN in Aug. 2021 Is on Losartan 50 mg ( new  medication )  Was also given hydralazine 10 mg to take as needed 3 times.  May be having TIAs recently .  MRI was unremarkable Carotid duplex is normal .   Past Medical History:  Diagnosis Date   Diverticulosis of colon    Essential tremor    GERD (gastroesophageal reflux disease)    occasional (no meds)   History of basal cell carcinoma excision 2012   Hypertension    Osteoporosis    PONV (postoperative nausea and vomiting)    Rectocele    symptomatic    Past Surgical History:  Procedure Laterality Date   BREAST EXCISIONAL BIOPSY Left 1995   BREAST EXCISIONAL BIOPSY Right 1988   CARDIOVERSION N/A 04/16/2018   Procedure: CARDIOVERSION;  Surgeon: Parke Poisson, MD;  Location: [facility]  ENDOSCOPY;  Service: Cardiovascular;  Laterality: N/A;   CATARACT EXTRACTION W/ INTRAOCULAR LENS  IMPLANT, BILATERAL  right 09/ 2013;  left 10/ 2013   COLONOSCOPY  last one 04/10/ 2012   CORONARY STENT INTERVENTION N/A 02/18/2018   Procedure: CORONARY STENT INTERVENTION;  Surgeon: Runell Gess, MD;  Location: [facility] INVASIVE CV LAB;  Service: Cardiovascular;  Laterality: N/A;   D & C HYSTEROSCOPY W/ RESECTION ENDOMETRIAL POLYP AND LESION  07-27-2007   dr Ambrose Mantle @[facility]    IR KYPHO LUMBAR INC FX REDUCE BONE BX UNI/BIL CANNULATION INC/IMAGING  07/11/2019   LEFT HEART CATH AND CORONARY ANGIOGRAPHY N/A 02/18/2018   Procedure: LEFT HEART CATH AND CORONARY ANGIOGRAPHY;  Surgeon: Runell Gess, MD;  Location: [facility] INVASIVE CV LAB;  Service: Cardiovascular;  Laterality: N/A;   RECTOCELE REPAIR N/A 11/26/2017   Procedure: POSTERIOR REPAIR (RECTOCELE);  Surgeon: Tracey Harries, MD;  [facility];  Service: Gynecology;  Laterality: N/A;  OUTPT IN BED   TEMPORARY PACEMAKER N/A 02/18/2018   Procedure: TEMPORARY PACEMAKER;  Surgeon: Runell Gess, MD;  Location: [facility] INVASIVE CV LAB;  Service: Cardiovascular;  Laterality: N/A;   TUBAL LIGATION Bilateral yrs ago    Current Medications: Current Meds  Medication Sig   apixaban (ELIQUIS) 5 MG TABS tablet Take 1 tablet (5 mg total) by mouth 2 (two) times daily.   atorvastatin (LIPITOR) 40 MG tablet Take 1 tablet (40 mg total) by mouth daily with supper.   calcium citrate (CALCITRATE - DOSED IN MG ELEMENTAL CALCIUM) 950 MG tablet Take 200 mg of elemental calcium by mouth daily.   docusate sodium (COLACE) 100 MG capsule Take 100 mg by mouth daily as needed for mild constipation.    feeding supplement, ENSURE ENLIVE, (ENSURE ENLIVE) LIQD Take 237 mLs by mouth 2 (two) times daily between meals.   furosemide (LASIX) 20 MG tablet Take 0.5 tablets (10 mg total) by mouth in the morning.   hydrALAZINE (APRESOLINE) 10 MG tablet  Take 1 tablet (10 mg total) by mouth 3 (three) times daily as needed (significantly elevated Blood pressures).   latanoprost (XALATAN) 0.005 % ophthalmic solution Place 1 drop into both eyes at bedtime.    loratadine (CLARITIN) 10 MG tablet Take 1 tablet (10 mg total) by mouth daily.   losartan (COZAAR) 50 MG tablet Take 50 mg by mouth daily.   Multiple Vitamin (MULTIVITAMIN WITH MINERALS) TABS tablet Take 1 tablet by mouth daily.   nitroGLYCERIN (NITROSTAT) 0.4 MG SL tablet Place 1 tablet (0.4 mg total) under the tongue every 5 (five) minutes as needed for chest pain.   pantoprazole (PROTONIX) 40 MG tablet Take 1 tablet (40 mg total) by mouth at bedtime.  propranolol (INDERAL) 10 MG tablet Take 1 tablet (10 mg total) by mouth 2 (two) times daily with a meal.   PSYLLIUM PO Take 1 capsule by mouth 2 (two) times daily.   QUEtiapine (SEROQUEL) 25 MG tablet Take 1 tablet (25 mg total) by mouth at bedtime.   vitamin B-12 (CYANOCOBALAMIN) 1000 MCG tablet Take 1,000 mcg by mouth daily.     Allergies:   Clindamycin/lincomycin and Nsaids   Social History   Socioeconomic History   Marital status: Widowed    Spouse name: Not on file   Number of children: Not on file   Years of education: Not on file   Highest education level: Not on file  Occupational History   Not on file  Tobacco Use   Smoking status: Former Smoker    Quit date: 05/08/1961    Years since quitting: 58.6   Smokeless tobacco: Never Used  Vaping Use   Vaping Use: Never used  Substance and Sexual Activity   Alcohol use: No   Drug use: Never   Sexual activity: Not on file  Other Topics Concern   Not on file  Social History Narrative   Not on file   Social Determinants of Health   Financial Resource Strain:    Difficulty of Paying Living Expenses: Not on file  Food Insecurity:    Worried About Programme researcher, broadcasting/film/video in the Last Year: Not on file   [employer] of Food in the Last Year: Not on file   Transportation Needs:    Lack of Transportation (Medical): Not on file   Lack of Transportation (Non-Medical): Not on file  Physical Activity:    Days of Exercise per Week: Not on file   Minutes of Exercise per Session: Not on file  Stress:    Feeling of Stress : Not on file  Social Connections:    Frequency of Communication with Friends and Family: Not on file   Frequency of Social Gatherings with Friends and Family: Not on file   Attends Religious Services: Not on file   Active Member of Clubs or Organizations: Not on file   Attends Banker Meetings: Not on file   Marital Status: Not on file     Family History: The patient's family history includes Breast cancer in her daughter, maternal aunt, maternal grandmother, and sister; Cancer in her daughter, maternal aunt, maternal grandmother, and sister; Heart disease in her father and sister.  ROS:   Please see the history of present illness.     All other systems reviewed and are negative.  EKGs/Labs/Other Studies Reviewed:    The following studies were reviewed today:   Recent Labs: 06/20/2019: Magnesium 2.0; TSH 1.610 06/21/2019: B Natriuretic Peptide 330.9 11/23/2019: ALT 19; BUN 13; Creatinine, Ser 0.80; Hemoglobin 14.9; Platelets 169; Potassium 3.9; Sodium 139  Recent Lipid Panel    Component Value Date/Time   CHOL 187 10/22/2019 1624   CHOL 184 04/25/2019 1602   TRIG 63 10/22/2019 1624   HDL 83 10/22/2019 1624   HDL 86 04/25/2019 1602   CHOLHDL 2.3 10/22/2019 1624   VLDL 13 10/22/2019 1624   LDLCALC 91 10/22/2019 1624   LDLCALC 79 04/25/2019 1602    Physical Exam: Blood pressure 134/80, pulse 68, height 5\' 2"  (1.575 m), weight 148 lb 12.8 oz (67.5 kg), SpO2 97 %.  GEN:  Elderly, frail female,   NAD  HEENT: Normal NECK: No JVD; No carotid bruits LYMPHATICS: No lymphadenopathy CARDIAC: RRR , no  murmurs, rubs, gallops RESPIRATORY:  Clear to auscultation without rales, wheezing or rhonchi   ABDOMEN: Soft, non-tender, non-distended MUSCULOSKELETAL:  No edema; No deformity  SKIN: Warm and dry NEUROLOGIC:  Alert and oriented x 3    ECG:   ASSESSMENT:    No diagnosis found. PLAN:      1.  Ventricular tachycardia:   No further episodes     2.  Inferior wall myocardial infarction-  No angina     3..  Acute congestive heart failure:  Seems to be ding well.  Cont losartan,  PRN hydralazine   4..  Paroxysmal atrial fibrillation: Continue Eliquis.  4.  Recent history of acute renal failure: She presented to the hospital with acute renal failure and marked hyperkalemia.  It appears that she had a urinary tract infection.  We will have her continue to check in with her primary medical doctor to watch for signs of UTI.  6.  HTN:   She was recently seen in the emergency room with an episode of markedly increased blood pressure.  She is now on losartan.  She is taking hydralazine.  She tries to avoid eating excessive salt.  Her blood pressure is well controlled today.  . Medication Adjustments/Labs and Tests Ordered: Current medicines are reviewed at length with the patient today.  Concerns regarding medicines are outlined above.  No orders of the defined types were placed in this encounter.  No orders of the defined types were placed in this encounter.   Patient Instructions  Medication Instructions:  Your provider recommends that you continue on your current medications as directed. Please refer to the Current Medication list given to you today.   *If you need a refill on your cardiac medications before your next appointment, please call your pharmacy*  Follow-Up: At [facility], you and your health needs are our priority.  As part of our continuing mission to provide you with exceptional heart care, we have created designated Provider Care Teams.  These Care Teams include your primary Cardiologist (physician) and Advanced Practice Providers (APPs -  Physician  Assistants and Nurse Practitioners) who all work together to provide you with the care you need, when you need it. Your next appointment:   6 month(s) The format for your next appointment:   In Person Provider:   You may see Kristeen Miss, MD or one of the following Advanced Practice Providers on your designated Care Team:    Tereso Newcomer, PA-C  Chelsea Aus, [place]      Signed, Kristeen Miss, MD  12/06/2019 5:56 PM    [facility]

## 2019-12-07 ENCOUNTER — Encounter: Payer: Self-pay | Admitting: Diagnostic Neuroimaging

## 2019-12-07 ENCOUNTER — Ambulatory Visit (INDEPENDENT_AMBULATORY_CARE_PROVIDER_SITE_OTHER): Payer: Medicare Other | Admitting: Diagnostic Neuroimaging

## 2019-12-07 VITALS — BP 162/87 | HR 67 | Ht 62.0 in | Wt 145.8 lb

## 2019-12-07 DIAGNOSIS — R413 Other amnesia: Secondary | ICD-10-CM

## 2019-12-07 NOTE — Patient Instructions (Signed)
MEMORY LOSS / COGNITIVE CHANGES (possible dementia with lewy bodies) - check neuropsychology testing - consider memantine 10mg  at bedtime; increase to twice a day after 1-2 weeks - safety / supervision issues reviewed - daily physical activity / exercise (at least 15-30 minutes) - eat more plants / vegetables - increase social activities, brain stimulation, games, puzzles, hobbies, crafts, arts, music - aim for at least 7-8 hours sleep per night (or more) - avoid smoking and alcohol - caregiver resources provided - caution with driving, finances, medications and living alone  B12 deficiency - continue B12 replacement

## 2019-12-07 NOTE — Progress Notes (Signed)
[facility]  PATIENT: Charlene Dickerson DOB: 12-21-1932  REFERRING CLINICIAN: Rejeana Brock,* HISTORY FROM: patient  REASON FOR VISIT: new consult    HISTORICAL  CHIEF COMPLAINT:  Chief Complaint  Patient presents with  . Word finding difficulty    rm 7 New Pt, hospital FU, dgtr- Charlene Dickerson    HISTORY OF PRESENT ILLNESS:   86 year old female with coronary artery disease, CHF, A. fib, presented to the hospital for word Hunt difficulties and confusion in August 2021.  Patient had been in fairly good health until about 86 years old.  Then she was having issues with blood pressure fluctuation, confusion and heart issues.  She had a heart attack in December 2019.  Since that time she has had problems with sleep, personality, mood and memory.  Patient lives independently.  Patient presented to hospital due to fluctuating confusion and word finding difficulties.  She was taken to the hospital for evaluation and code stroke was activated.  Patient was admitted for further evaluation.  Stroke work-up was completed.  Possibility of underlying neurodegenerative process was raised.  Patient had low B12 level of 189 and was started on replacement.  Patient was having delirium and visual hallucinations treated with Seroquel.  Since that time symptoms have improved but continue to fluctuate.  She has issues with multitasking, short-term memory, problem-solving, attention and language.   REVIEW OF SYSTEMS: Full 14 system review of systems performed and negative with exception of: As per HPI.  ALLERGIES: Allergies  Allergen Reactions  . Clindamycin/Lincomycin Diarrhea and Other (See Comments)    "c-diff"  . Nsaids Other (See Comments)    Was told to not take these    HOME MEDICATIONS: Outpatient Medications Prior to Visit  Medication Sig Dispense Refill  . apixaban (ELIQUIS) 5 MG TABS tablet Take 1 tablet (5 mg total) by mouth 2 (two) times daily. 60 tablet 0  .  atorvastatin (LIPITOR) 40 MG tablet Take 1 tablet (40 mg total) by mouth daily with supper. 30 tablet 0  . calcium citrate (CALCITRATE - DOSED IN MG ELEMENTAL CALCIUM) 950 MG tablet Take 200 mg of elemental calcium by mouth daily.    Marland Kitchen docusate sodium (COLACE) 100 MG capsule Take 100 mg by mouth daily as needed for mild constipation.     . feeding supplement, ENSURE ENLIVE, (ENSURE ENLIVE) LIQD Take 237 mLs by mouth 2 (two) times daily between meals. 237 mL 12  . furosemide (LASIX) 20 MG tablet Take 0.5 tablets (10 mg total) by mouth in the morning. 15 tablet 0  . hydrALAZINE (APRESOLINE) 10 MG tablet Take 1 tablet (10 mg total) by mouth 3 (three) times daily as needed (significantly elevated Blood pressures). 21 tablet 0  . latanoprost (XALATAN) 0.005 % ophthalmic solution Place 1 drop into both eyes at bedtime.     Marland Kitchen loratadine (CLARITIN) 10 MG tablet Take 1 tablet (10 mg total) by mouth daily. 30 tablet 0  . losartan (COZAAR) 50 MG tablet Take 50 mg by mouth daily.    . Multiple Vitamin (MULTIVITAMIN WITH MINERALS) TABS tablet Take 1 tablet by mouth daily.    . nitroGLYCERIN (NITROSTAT) 0.4 MG SL tablet Place 1 tablet (0.4 mg total) under the tongue every 5 (five) minutes as needed for chest pain. 25 tablet 3  . pantoprazole (PROTONIX) 40 MG tablet Take 1 tablet (40 mg total) by mouth at bedtime. 30 tablet 0  . propranolol (INDERAL) 10 MG tablet Take 1 tablet (10 mg total) by  mouth 2 (two) times daily with a meal. 60 tablet 0  . PSYLLIUM PO Take 1 capsule by mouth 2 (two) times daily.    . QUEtiapine (SEROQUEL) 25 MG tablet Take 1 tablet (25 mg total) by mouth at bedtime. 30 tablet 0  . vitamin B-12 (CYANOCOBALAMIN) 1000 MCG tablet Take 1,000 mcg by mouth daily.     No facility-administered medications prior to visit.    PAST MEDICAL HISTORY: Past Medical History:  Diagnosis Date  . Diverticulosis of colon   . Essential tremor   . GERD (gastroesophageal reflux disease)    occasional (no  meds)  . History of basal cell carcinoma excision 2012  . Hypertension   . Osteoporosis   . PONV (postoperative nausea and vomiting)   . Rectocele    symptomatic    PAST SURGICAL HISTORY: Past Surgical History:  Procedure Laterality Date  . BREAST EXCISIONAL BIOPSY Left 1995  . BREAST EXCISIONAL BIOPSY Right 1988  . CARDIOVERSION N/A 04/16/2018   Procedure: CARDIOVERSION;  Surgeon: Parke Poisson, MD;  Location: [facility] ENDOSCOPY;  Service: Cardiovascular;  Laterality: N/A;  . CATARACT EXTRACTION W/ INTRAOCULAR LENS  IMPLANT, BILATERAL  right 09/ 2013;  left 10/ 2013  . COLONOSCOPY  last one 04/10/ 2012  . CORONARY STENT INTERVENTION N/A 02/18/2018   Procedure: CORONARY STENT INTERVENTION;  Surgeon: Runell Gess, MD;  Location: [facility] INVASIVE CV LAB;  Service: Cardiovascular;  Laterality: N/A;  . D & C HYSTEROSCOPY W/ RESECTION ENDOMETRIAL POLYP AND LESION  07-27-2007   dr Ambrose Mantle @[facility]   . IR KYPHO LUMBAR INC FX REDUCE BONE BX UNI/BIL CANNULATION INC/IMAGING  07/11/2019  . LEFT HEART CATH AND CORONARY ANGIOGRAPHY N/A 02/18/2018   Procedure: LEFT HEART CATH AND CORONARY ANGIOGRAPHY;  Surgeon: Runell Gess, MD;  Location: [facility] INVASIVE CV LAB;  Service: Cardiovascular;  Laterality: N/A;  . RECTOCELE REPAIR N/A 11/26/2017   Procedure: POSTERIOR REPAIR (RECTOCELE);  Surgeon: Tracey Harries, MD;  Location: [facility];  Service: Gynecology;  Laterality: N/A;  OUTPT IN BED  . TEMPORARY PACEMAKER N/A 02/18/2018   Procedure: TEMPORARY PACEMAKER;  Surgeon: Runell Gess, MD;  Location: [facility] INVASIVE CV LAB;  Service: Cardiovascular;  Laterality: N/A;  . TUBAL LIGATION Bilateral yrs ago    FAMILY HISTORY: Family History  Problem Relation Age of Onset  . Cancer Sister        Breast  . Heart disease Sister   . Breast cancer Sister   . Heart disease Father   . Cancer Daughter        breast  . Breast cancer Daughter   . Cancer Maternal Aunt        breast  . Breast  cancer Maternal Aunt   . Cancer Maternal Grandmother        breast  . Breast cancer Maternal Grandmother   . Pancreatic cancer Sister     SOCIAL HISTORY: Social History   Socioeconomic History  . Marital status: Widowed    Spouse name: Not on file  . Number of children: 2  . Years of education: Not on file  . Highest education level: High school graduate  Occupational History  . Not on file  Tobacco Use  . Smoking status: Former Smoker    Quit date: 05/08/1961    Years since quitting: 58.6  . Smokeless tobacco: Never Used  Vaping Use  . Vaping Use: Never used  Substance and Sexual Activity  . Alcohol use: No  . Drug use:  Never  . Sexual activity: Not on file  Other Topics Concern  . Not on file  Social History Narrative   12/07/19 lives alone   Social Determinants of Health   Financial Resource Strain:   . Difficulty of Paying Living Expenses: Not on file  Food Insecurity:   . Worried About Programme researcher, broadcasting/film/video in the Last Year: Not on file  . Ran Out of Food in the Last Year: Not on file  Transportation Needs:   . Lack of Transportation (Medical): Not on file  . Lack of Transportation (Non-Medical): Not on file  Physical Activity:   . Days of Exercise per Week: Not on file  . Minutes of Exercise per Session: Not on file  Stress:   . Feeling of Stress : Not on file  Social Connections:   . Frequency of Communication with Friends and Family: Not on file  . Frequency of Social Gatherings with Friends and Family: Not on file  . Attends Religious Services: Not on file  . Active Member of Clubs or Organizations: Not on file  . Attends Banker Meetings: Not on file  . Marital Status: Not on file  Intimate Partner Violence:   . Fear of Current or Ex-Partner: Not on file  . Emotionally Abused: Not on file  . Physically Abused: Not on file  . Sexually Abused: Not on file     PHYSICAL EXAM  GENERAL EXAM/CONSTITUTIONAL: Vitals:  Vitals:   12/07/19  0937  BP: (!) 162/87  Pulse: 67  Weight: 145 lb 12.8 oz (66.1 kg)  Height:  (1.575 m)     Body mass index is 26.67 kg/m. Wt Readings from Last 3 Encounters:  12/07/19 145 lb 12.8 oz (66.1 kg)  12/06/19 148 lb 12.8 oz (67.5 kg)  10/22/19 143 lb (64.9 kg)     Patient is in no distress; well developed, nourished and groomed; neck is supple  CARDIOVASCULAR:  Examination of carotid arteries is normal; no carotid bruits  Regular rate and rhythm, no murmurs  Examination of peripheral vascular system by observation and palpation is normal  EYES:  Ophthalmoscopic exam of optic discs and posterior segments is normal; no papilledema or hemorrhages  No exam data present  MUSCULOSKELETAL:  Gait, strength, tone, movements noted in Neurologic exam below  NEUROLOGIC: MENTAL STATUS:  MMSE - Mini Mental State Exam 12/07/2019  Orientation to time 5  Orientation to Place 5  Registration 3  Attention/ Calculation 1  Recall 3  Language- name 2 objects 2  Language- repeat 1  Language- follow 3 step command 3  Language- read & follow direction 1  Write a sentence 1  Copy design 0  Total score 25    awake, alert, oriented to person, place and time  recent and remote memory intact  normal attention and concentration  language fluent, comprehension intact, naming intact  fund of knowledge appropriate  CRANIAL NERVE:   2nd - no papilledema on fundoscopic exam  2nd, 3rd, 4th, 6th - pupils equal and reactive to light, visual fields full to confrontation, extraocular muscles intact, no nystagmus  5th - facial sensation symmetric  7th - facial strength symmetric  8th - hearing intact  9th - palate elevates symmetrically, uvula midline  11th - shoulder shrug symmetric  12th - tongue protrusion midline  MOTOR:   HEAD TREMOR  COGWHEELING AND BRADYKINESIA IN LUE > RUE  normal bulk and tone, full strength in the BUE, BLE  SENSORY:  normal and symmetric to  light touch, temperature, vibration; EXCEPT DECR IN FINGERS AND FEET  COORDINATION:   finger-nose-finger, fine finger movements normal  REFLEXES:   deep tendon reflexes BRISK IN UPPER EXT and symmetric  GAIT/STATION:   narrow based gait; SHORT STEPS     DIAGNOSTIC DATA (LABS, IMAGING, TESTING) - I reviewed patient records, labs, notes, testing and imaging myself where available.  Lab Results  Component Value Date   WBC 4.2 11/23/2019   HGB 14.9 11/23/2019   HCT 45.5 11/23/2019   MCV 98.9 11/23/2019   PLT 169 11/23/2019      Component Value Date/Time   NA 139 11/23/2019 1220   NA 143 04/25/2019 1602   K 3.9 11/23/2019 1220   CL 103 11/23/2019 1220   CO2 26 11/23/2019 1220   GLUCOSE 95 11/23/2019 1220   BUN 13 11/23/2019 1220   BUN 13 04/25/2019 1602   CREATININE 0.80 11/23/2019 1220   CALCIUM 9.3 11/23/2019 1220   PROT 6.6 11/23/2019 1220   PROT 7.1 04/25/2019 1602   ALBUMIN 3.9 11/23/2019 1220   ALBUMIN 4.2 04/25/2019 1602   AST 20 11/23/2019 1220   ALT 19 11/23/2019 1220   ALKPHOS 46 11/23/2019 1220   BILITOT 1.0 11/23/2019 1220   BILITOT 0.5 04/25/2019 1602   GFRNONAA >60 11/23/2019 1220   GFRAA >60 11/23/2019 1220   Lab Results  Component Value Date   CHOL 187 10/22/2019   HDL 83 10/22/2019   LDLCALC 91 10/22/2019   TRIG 63 10/22/2019   CHOLHDL 2.3 10/22/2019   Lab Results  Component Value Date   HGBA1C 5.4 10/22/2019   Lab Results  Component Value Date   VITAMINB12 189 10/23/2019   Lab Results  Component Value Date   TSH 1.610 06/20/2019    06/20/19 TTE 1. Left ventricular ejection fraction, by estimation, is 60 to 65%. The  left ventricle has normal function. The left ventricle has no regional  wall motion abnormalities. Left ventricular diastolic parameters are  consistent with Grade I diastolic  dysfunction (impaired relaxation).  2. Right ventricular systolic function is normal. The right ventricular  size is normal. There is  mildly elevated pulmonary artery systolic  pressure. The estimated right ventricular systolic pressure is 32.4 mmHg.  3. The mitral valve is normal in structure. No evidence of mitral valve  regurgitation. No evidence of mitral stenosis.  4. Tricuspid valve regurgitation is moderate.  5. The aortic valve is normal in structure. Aortic valve regurgitation is  moderate. No aortic stenosis is present.  6. The inferior vena cava is normal in size with greater than 50%  respiratory variability, suggesting right atrial pressure of 3 mmHg.    10/22/19 MRI brain - No acute or subacute infarction. Age related volume loss. Mild chronic small-vessel change of the cerebral hemispheric white matter.   11/23/19 MRI brain  - No acute abnormality - Mild atrophy and mild chronic microvascular ischemic change in the white matter.  11/23/19 CTA head / neck 1. Negative CTA for large vessel occlusion. 2. Mild for age atheromatous change involving the major arterial vasculature of the head and neck without hemodynamically significant or correctable stenosis. 3. Diffuse tortuosity of the major arterial vasculature, suggesting chronic underlying hypertension. Question mild FMD involving the cervical right ICA. No dissection. 4. Disc osteophyte complex at C6-7 with resultant mild to moderate stenosis.     ASSESSMENT AND PLAN  86 y.o. year old female here with fluctuating memory loss, cognitive changes, extrapyramidal symptoms,  hallucinations, since 2019, worse in the past 6 months.  Some changes in ADLs.  Signs and symptoms concerning for neurodegenerative dementia such as dementia with Lewy bodies.  Dx:  1. Memory loss      PLAN:  MEMORY LOSS / COGNITIVE CHANGES (possible dementia with lewy bodies) - check neuropsychology testing - consider memantine 10mg  at bedtime; increase to twice a day after 1-2 weeks - safety / supervision issues reviewed - daily physical activity / exercise (at  least 15-30 minutes) - eat more plants / vegetables - increase social activities, brain stimulation, games, puzzles, hobbies, crafts, arts, music - aim for at least 7-8 hours sleep per night (or more) - avoid smoking and alcohol - caregiver resources provided - caution with driving, finances, medications and living alone  B12 deficiency - continue B12 replacement  Orders Placed This Encounter  Procedures  . Ambulatory referral to Neuropsychology   Return in about 6 months (around 06/06/2020) for with NP (Amy Lomax).    08/06/2020, MD 12/07/2019, 9:53 AM Certified in Neurology, Neurophysiology and Neuroimaging  [facility] [address] [place], [place] [phone]

## 2019-12-12 ENCOUNTER — Encounter: Payer: Self-pay | Admitting: Diagnostic Neuroimaging

## 2019-12-19 ENCOUNTER — Ambulatory Visit: Payer: Medicare Other

## 2020-01-12 ENCOUNTER — Other Ambulatory Visit: Payer: Self-pay

## 2020-01-12 ENCOUNTER — Ambulatory Visit
Admission: RE | Admit: 2020-01-12 | Discharge: 2020-01-12 | Disposition: A | Discharge status: Home / Self Care | Payer: Medicare Other | Source: Ambulatory Visit | Attending: Internal Medicine | Admitting: Internal Medicine

## 2020-01-12 DIAGNOSIS — Z1231 Encounter for screening mammogram for malignant neoplasm of breast: Secondary | ICD-10-CM

## 2020-01-23 ENCOUNTER — Encounter: Payer: Self-pay | Admitting: Psychology

## 2020-01-23 ENCOUNTER — Ambulatory Visit: Payer: Medicare Other | Admitting: Psychology

## 2020-01-23 ENCOUNTER — Other Ambulatory Visit: Payer: Self-pay

## 2020-01-23 ENCOUNTER — Ambulatory Visit (INDEPENDENT_AMBULATORY_CARE_PROVIDER_SITE_OTHER): Payer: Medicare Other | Admitting: Psychology

## 2020-01-23 DIAGNOSIS — G3184 Mild cognitive impairment, so stated: Secondary | ICD-10-CM

## 2020-01-23 DIAGNOSIS — R3129 Other microscopic hematuria: Secondary | ICD-10-CM

## 2020-01-23 DIAGNOSIS — Z87898 Personal history of other specified conditions: Secondary | ICD-10-CM | POA: Diagnosis not present

## 2020-01-23 DIAGNOSIS — R4189 Other symptoms and signs involving cognitive functions and awareness: Secondary | ICD-10-CM

## 2020-01-23 DIAGNOSIS — E538 Deficiency of other specified B group vitamins: Secondary | ICD-10-CM | POA: Insufficient documentation

## 2020-01-23 DIAGNOSIS — F0670 Mild neurocognitive disorder due to known physiological condition without behavioral disturbance: Secondary | ICD-10-CM

## 2020-01-23 DIAGNOSIS — F411 Generalized anxiety disorder: Secondary | ICD-10-CM | POA: Insufficient documentation

## 2020-01-23 DIAGNOSIS — I2111 ST elevation (STEMI) myocardial infarction involving right coronary artery: Secondary | ICD-10-CM

## 2020-01-23 DIAGNOSIS — G459 Transient cerebral ischemic attack, unspecified: Secondary | ICD-10-CM | POA: Insufficient documentation

## 2020-01-23 HISTORY — DX: Mild neurocognitive disorder due to known physiological condition without behavioral disturbance: F06.70

## 2020-01-23 HISTORY — DX: Mild cognitive impairment, so stated: G31.84

## 2020-01-23 HISTORY — DX: Other microscopic hematuria: R31.29

## 2020-01-23 NOTE — Progress Notes (Signed)
   Psychometrician Note   Cognitive testing was administered to Charlene Dickerson by Wallace Keller, B.S. (psychometrist) under the supervision of Dr. Newman Nickels, Ph.D., licensed psychologist on 01/23/20. [name] did not appear overtly distressed by the testing session per behavioral observation or responses across self-report questionnaires. Dr. Newman Nickels, Ph.D. checked in with Ms. Berens as needed to manage any distress related to testing procedures (if applicable). Rest breaks were offered.    The battery of tests administered was selected by Dr. Newman Nickels, Ph.D. with consideration to [name]'s current level of functioning, the nature of her symptoms, emotional and behavioral responses during interview, level of literacy, observed level of motivation/effort, and the nature of the referral question. This battery was communicated to the psychometrist. Communication between Dr. Newman Nickels, Ph.D. and the psychometrist was ongoing throughout the evaluation and Dr. Newman Nickels, Ph.D. was immediately accessible at all times. Dr. Newman Nickels, Ph.D. provided supervision to the psychometrist on the date of this service to the extent necessary to assure the quality of all services provided.    Charlene Dickerson will return within approximately 1-2 weeks for an interactive feedback session with Dr. Milbert Coulter at which time her test performances, clinical impressions, and treatment recommendations will be reviewed in detail. [name] understands she can contact our office should she require our assistance before this time.  A total of 150 minutes of billable time were spent face-to-face with [name] by the psychometrist. This includes both test administration and scoring time. Billing for these services is reflected in the clinical report generated by Dr. Newman Nickels, Ph.D..  This note reflects time spent with the psychometrician and does not include test scores or any clinical  interpretations made by Dr. Milbert Coulter. The full report will follow in a separate note.

## 2020-01-23 NOTE — Progress Notes (Addendum)
NEUROPSYCHOLOGICAL EVALUATION [place] Department of Neurology  Date of Evaluation: January 23, 2020  Reason for Referral:   Charlene Dickerson is a 86 y.o. right-handed Caucasian female referred by Joycelyn Schmid, M.D., to characterize her current cognitive functioning and assist with diagnostic clarity and treatment planning in the context of subjective cognitive decline, a history of delirium with visual/auditory hallucinations, and several cardiovascular comorbidities.   Assessment and Plan:   Clinical Impression(s): Charlene Dickerson' pattern of performance is suggestive of primary impairments surrounding executive functioning and semantic fluency. A relative weakness was additionally exhibited across processing speed, while variability was exhibited across retrieval aspects of memory. Despite her drawing of a clock being impaired, performance across other visuospatial/visuoconstructional tasks was appropriate. Performance was also appropriate across attention/concentration, receptive language, phonemic fluency, confrontation naming, and both encoding (i.e., learning) and consolidation aspects of memory. Charlene Dickerson denied difficulties completing instrumental activities of daily living (ADLs) independently and this was not contradicted by her daughter. As such, given evidence for cognitive dysfunction described above, she meets criteria for a Mild Neurocognitive Disorder (formerly "mild cognitive impairment") at the present time.  The etiology for cognitive weakness is likely multifactorial in nature. She was hospitalized in August 2021 and experienced symptoms of delirium. This presentation can be accompanied by persisting and sometimes permanent cognitive dysfunction, especially affecting executive functioning. Additionally, her pattern of performance across testing is somewhat consistent with a primary vascular etiology. She has numerous cardiovascular conditions in  her medical history and this etiology would be consistent with reports that deficits started following her heart attack in 2019.   Regarding concerns of neurodegenerative illness raised by her neurologist, her profile is inconsistent with the most common forms of illness. Despite weaknesses in semantic fluency and retrieval aspects of memory, she performed well across yes/no recognition trials on memory tasks, thus not suggestive of a memory storage deficit or Alzheimer's disease. Visual hallucinations were reported only during her period of delirium and both her and her daughter denied their presence before or after this event. As such, prior hallucinations were likely secondary to delirium rather than related to possible Lewy body dementia. Her cognitive profile and behavioral characteristics are further inconsistent with this illness. Finally, behavioral characteristics are also inconsistent with frontotemporal dementia. Charlene Dickerson and her daughter's report of improving cognitive abilities rather than worsening abilities is also inconsistent with the presence of a neurodegenerative illness. As such, a combination of a vascular neurocognitive disorder combined with complications of delirium represent the most likely culprit for experienced cognitive deficits. Continued medical monitoring will be important moving forward.  Recommendations: A repeat neuropsychological evaluation in 18 months (or sooner if functional decline is noted) is recommended to assess the trajectory of future cognitive decline should it occur. This will also aid in future efforts towards improved diagnostic clarity.  Treating ongoing hearing loss with hearing aids will be vital. Recent studies have suggested that uncorrected hearing loss can elevated one's risk for the future development of dementia. Charlene Dickerson is strongly encouraged to follow the recommendations of her recent hearing evaluation and seek out appropriate intervention.    Charlene Dickerson and her daughter reported noted benefits stemming from engaging in outpatient speech therapy. I would strongly encourage her to continue these services on an outpatient basis, particularly those which emphasize compensatory strategies dealing with executive functioning, memory retrieval, and adaptive functioning.   Primary vascular etiologies are often stable over time relative to neurodegenerative processes. However, should there be a  progression of her current deficits, Charlene Dickerson is unlikely to regain any independent living skills lost. Therefore, it is recommended that she remain as involved as possible in all aspects of household chores, finances, and medication management, with supervision to ensure adequate performance if necessary. She will likely benefit from the establishment and maintenance of a routine in order to maximize her functional abilities over time.  Performance across neurocognitive testing is not a strong predictor of an individual's safety operating a motor vehicle. However, she did demonstrate deficits with executive functioning and processing speed, which certainly raises some concerns. I would recommend that she pursue a formalized driving evaluation. Her and/or her family should contact [employer] [facility] in [place] at [phone]. Another option would be through [facility]; however, the latter would likely require a referral from a medical doctor. [facility] can be reached directly at [phone].   Charlene Dickerson is encouraged to attend to lifestyle factors for brain health (e.g., regular physical exercise, good nutrition habits, regular participation in cognitively-stimulating activities, and general stress management techniques), which are likely to have benefits for both emotional adjustment and cognition. Optimal control of vascular risk factors (including safe cardiovascular exercise and adherence to dietary recommendations) is  encouraged. Continued participation in activities which provide mental stimulation and social interaction is also recommended.   When learning new information, she would benefit from information being broken up into small, manageable pieces. She may also find it helpful to articulate the material in her own words and in a context to promote encoding at the onset of a new task. This material may need to be repeated multiple times to promote encoding.  Memory can be improved using internal strategies such as rehearsal, repetition, chunking, mnemonics, association, and imagery. External strategies such as written notes in a consistently used memory journal, visual and nonverbal auditory cues such as a calendar on the refrigerator or appointments with alarm, such as on a cell phone, can also help maximize recall.    To address problems with processing speed, she may wish to consider:   -Ensuring that she is alerted when essential material or instructions are being presented   -Allowing additional processing time or a chance to rehearse novel information   -Allowing for more time in comprehending, processing, and responding in conversation  To address problems with executive dysfunction, she may wish to consider:   -Avoiding external distractions when needing to concentrate   -Limiting exposure to fast paced environments with multiple sensory demands   -Writing down complicated information and using checklists   -Attempting and completing one task at a time (i.e., no multi-tasking)   -Verbalizing aloud each step of a task to maintain focus   -Taking frequent breaks during the completion of steps/tasks to avoid fatigue   -Reducing the amount of information considered at one time  Review of Records:   Charlene Dickerson presented to the ED on 10/22/2019 after a several day history of anomic aphasia. In the ED, blood pressure was 148/83. Given word finding difficulties, code stroke was initiated with neurology  consultation. Her anomic aphasia was eventually believed to be secondary to underlying cognitive deficits. She also developed visual hallucinations during her hospitalization, believed to be secondary to delirium. She reported individuals in her room, specifically construction workers walking behind her bed. Psychiatry was consulted and Charlene Dickerson was started on Seroquel 25 mg p.o. nightly. She was ultimately discharged to rehab.   Charlene Dickerson was seen briefly in the ED  on 11/23/2019 with symptoms of elevated blood pressure and concerns for a transient ischemic attack (TIA). According to Charlene Dickerson, she woke up feeling normal that morning. However, after taking her blood pressure medication and checking her blood pressure, she discovered that her systolic value was over 200. She then noticed a left facial droop and difficulty swallowing. She denied difficulty speaking but did report some blurred vision. She also reported facial numbness and right-sided occipital and neck headache. Symptoms reportedly lasted for approximately 15 minutes before subsiding. This was said to not represent her first TIA. She was ultimately discharged home.  Ms. Baisch was seen by [facility] Joycelyn Schmid, M.D.) on 12/07/2019 for an evaluation of memory loss. Briefly, Charlene Dickerson was said to have been in fairly good health until presenting to the hospital in August 2021 with word finding difficulties and ongoing confusion. At that time, she was having issues with blood pressure and other cardiovascular complications. During her hospitalization, a stroke work-up was completed. Low B12 levels were discovered and she was started on replacement. She was also noted to experience delirium with visual hallucinations. These were treated with Seroquel. She also has a history of a heart attack in 2019. Performance on a brief cognitive screening instrument (MMSE) was 25/30. Ultimately, Charlene Dickerson was referred for a  comprehensive neuropsychological evaluation to characterize her cognitive abilities and to assist with diagnostic clarity and treatment planning.   Head CT on 10/22/2019 revealed age congruent senescent findings but no acute intracranial abnormalities. Head CTA on 10/22/2019 revealed an undulating appearance of the right cervical ICA and possible mild FMD. Mild for age atherosclerosis without proximal stenosis was also noted. Brain MRI on 10/22/2019 revealed age-related volume loss and mild chronic small vessel ischemic changes. No acute or subacute infarction was noted. Head CT on 11/23/2019 was negative for an acute intracranial abnormality. Head CTA on 11/23/2019 revealed Mild for age atheromatous change involving the major arterial vasculature of the head and neck without hemodynamically significant or correctable stenosis, as well as diffuse tortuosity of the major arterial vasculature suggesting chronic underlying hypertension. 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 Brain MRI on 11/23/2019 was stable relative to previous imaging.   Past Medical History:  Diagnosis Date  . AKI (acute kidney injury) 06/20/2019  . CAD (coronary artery disease) 06/20/2019  . Chronic diastolic CHF (congestive heart failure) 06/20/2019  . Closed compression fracture of L4 lumbar vertebra 06/20/2019  . Complete heart block   . Cystocele with rectocele 11/26/2017  . Diffuse cystic mastopathy of breast 05/09/2011  . Diverticulosis of colon   . Essential hypertension 06/20/2019  . Generalized anxiety disorder   . GERD (gastroesophageal reflux disease)    occasional (no meds)  . History of basal cell carcinoma excision 2012  . History of delirium   . Hyperkalemia 06/20/2019  . Low back pain 06/01/2019  . Malnutrition of moderate degree 06/21/2019  . Metabolic acidosis 06/20/2019  . Microscopic hematuria 01/23/2020  . Osteoporosis   . Paroxysmal atrial fibrillation   . PONV  (postoperative nausea and vomiting)   . Shock circulatory   . STEMI (ST elevation myocardial infarction) 02/18/2018  . Transient ischemic attack (TIA)   . Uremia 06/20/2019  . Ventricular tachycardia 07/15/2019  . Vitamin B12 deficiency     Past Surgical History:  Procedure Laterality Date  . BREAST EXCISIONAL BIOPSY Left 1995  . BREAST EXCISIONAL BIOPSY Right 1988  . CARDIOVERSION N/A 04/16/2018   Procedure:  CARDIOVERSION;  Surgeon: Parke Poisson, MD;  Location: [facility] ENDOSCOPY;  Service: Cardiovascular;  Laterality: N/A;  . CATARACT EXTRACTION W/ INTRAOCULAR LENS  IMPLANT, BILATERAL  right 09/ 2013;  left 10/ 2013  . COLONOSCOPY  last one 04/10/ 2012  . CORONARY STENT INTERVENTION N/A 02/18/2018   Procedure: CORONARY STENT INTERVENTION;  Surgeon: Runell Gess, MD;  Location: [facility] INVASIVE CV LAB;  Service: Cardiovascular;  Laterality: N/A;  . D & C HYSTEROSCOPY W/ RESECTION ENDOMETRIAL POLYP AND LESION  07-27-2007   dr Ambrose Mantle   . IR KYPHO LUMBAR INC FX REDUCE BONE BX UNI/BIL CANNULATION INC/IMAGING  07/11/2019  . LEFT HEART CATH AND CORONARY ANGIOGRAPHY N/A 02/18/2018   Procedure: LEFT HEART CATH AND CORONARY ANGIOGRAPHY;  Surgeon: Runell Gess, MD;  Location: [facility] INVASIVE CV LAB;  Service: Cardiovascular;  Laterality: N/A;  . RECTOCELE REPAIR N/A 11/26/2017   Procedure: POSTERIOR REPAIR (RECTOCELE);  Surgeon: Tracey Harries, MD;  Location: [facility];  Service: Gynecology;  Laterality: N/A;  OUTPT IN BED  . TEMPORARY PACEMAKER N/A 02/18/2018   Procedure: TEMPORARY PACEMAKER;  Surgeon: Runell Gess, MD;  Location: [facility] INVASIVE CV LAB;  Service: Cardiovascular;  Laterality: N/A;  . TUBAL LIGATION Bilateral yrs ago    Current Outpatient Medications:  .  apixaban (ELIQUIS) 5 MG TABS tablet, Take 1 tablet (5 mg total) by mouth 2 (two) times daily., Disp: 60 tablet, Rfl: 0 .  atorvastatin (LIPITOR) 40 MG tablet, Take 1 tablet (40 mg total) by mouth daily with  supper., Disp: 30 tablet, Rfl: 0 .  calcium citrate (CALCITRATE - DOSED IN MG ELEMENTAL CALCIUM) 950 MG tablet, Take 200 mg of elemental calcium by mouth daily., Disp: , Rfl:  .  docusate sodium (COLACE) 100 MG capsule, Take 100 mg by mouth daily as needed for mild constipation. , Disp: , Rfl:  .  feeding supplement, ENSURE ENLIVE, (ENSURE ENLIVE) LIQD, Take 237 mLs by mouth 2 (two) times daily between meals., Disp: 237 mL, Rfl: 12 .  furosemide (LASIX) 20 MG tablet, Take 0.5 tablets (10 mg total) by mouth in the morning., Disp: 15 tablet, Rfl: 0 .  hydrALAZINE (APRESOLINE) 10 MG tablet, Take 1 tablet (10 mg total) by mouth 3 (three) times daily as needed (significantly elevated Blood pressures)., Disp: 21 tablet, Rfl: 0 .  latanoprost (XALATAN) 0.005 % ophthalmic solution, Place 1 drop into both eyes at bedtime. , Disp: , Rfl:  .  loratadine (CLARITIN) 10 MG tablet, Take 1 tablet (10 mg total) by mouth daily., Disp: 30 tablet, Rfl: 0 .  losartan (COZAAR) 50 MG tablet, Take 50 mg by mouth daily., Disp: , Rfl:  .  Multiple Vitamin (MULTIVITAMIN WITH MINERALS) TABS tablet, Take 1 tablet by mouth daily., Disp: , Rfl:  .  nitroGLYCERIN (NITROSTAT) 0.4 MG SL tablet, Place 1 tablet (0.4 mg total) under the tongue every 5 (five) minutes as needed for chest pain., Disp: 25 tablet, Rfl: 3 .  pantoprazole (PROTONIX) 40 MG tablet, Take 1 tablet (40 mg total) by mouth at bedtime., Disp: 30 tablet, Rfl: 0 .  propranolol (INDERAL) 10 MG tablet, Take 1 tablet (10 mg total) by mouth 2 (two) times daily with a meal., Disp: 60 tablet, Rfl: 0 .  PSYLLIUM PO, Take 1 capsule by mouth 2 (two) times daily., Disp: , Rfl:  .  QUEtiapine (SEROQUEL) 25 MG tablet, Take 1 tablet (25 mg total) by mouth at bedtime., Disp: 30 tablet, Rfl: 0 .  vitamin B-12 (  CYANOCOBALAMIN) 1000 MCG tablet, Take 1,000 mcg by mouth daily., Disp: , Rfl:   Clinical Interview:   The following information was obtained during a clinical interview with  Charlene Dickerson and her daughter prior to cognitive testing.  Cognitive Symptoms: Decreased short-term memory: Denied. However, her daughter did acknowledge generally mild difficulties surrounding repeating things that were previously said or discussed. She noted that her mother takes copious notes, is well organized, and does not have trouble recalling the names of familiar individuals or important events (such as birthdays).  Decreased long-term memory: Denied. Decreased attention/concentration: Denied. However, her daughter did describe mild difficulties with sustained attention and/or distractibility at times.  Reduced processing speed: Denied. Difficulties with executive functions: Denied. Her daughter reported her mother more easily feeling overwhelmed when needing to multi-task, creating difficulties in this area. However, other areas of executive functioning were described as appropriate.  Difficulties with emotion regulation: Denied. Difficulties with receptive language: Denied assuming she can hear the source of the sound adequately.  Difficulties with word finding: Denied. Her daughter noted that her mother exhibited word finding and word recall deficits following previous TIAs. However, these have improved notably with the help of ongoing outpatient speech therapy.  Decreased visuoperceptual ability: Denied.  Trajectory of deficits: Charlene Dickerson denied any ongoing cognitive deficits. While her daughter did acknowledge some, these were generally mild in nature and described as improving rather than worsening. Per medical records, deficits were first noticed following her heart attack in 2019 and then worsened following her recent hospitalization with evidence for delirium in August 2021.   Difficulties completing ADLs: Largely denied. Charlene Dickerson' family have worked with her to color code and organize her medications. Charlene Dickerson noted taking her medications as prescribed independently. Her family  has moved her to online bill paying and auto-draft as a means to eliminate stress. Her daughter stated that her mother could likely perform these actions independently if necessary. Charlene Dickerson continues to drive short distances without reported difficulty.   Additional Medical History: History of traumatic brain injury/concussion: She reported a fall occurring approximately 1.5 months ago where she missed a step, causing her to fall backwards and hit the back of her head. She denied a loss in consciousness or being formally diagnosed with a concussion after being worked up for possible injuries. No other potential concussive events were reported.  History of stroke: Denied. However, there is a history of several TIAs with the most recent occurring on 11/23/2019 as described above.  History of seizure activity: Denied. History of known exposure to toxins: Denied. Symptoms of chronic pain: Endorsed. She reported chronic back pain, as well as occasional pain in her lower extremities. Overall pain symptoms were described as manageable.  Experience of frequent headaches/migraines: Denied. Frequent instances of dizziness/vertigo: Denied.  Sensory changes: She described her vision as adequate and noted a history of cataract surgery. She is hard of hearing and stated that a recent hearing evaluation recommended hearing aids. Her and her daughter expressed a desire to move forward with this recommendation. Other sensory changes/difficulties (e.g., taste or smell) were denied.  Balance/coordination difficulties: Largely denied. She reported ongoing back pain which impacts her mobility and stability at times. She did acknowledge one fall occurring 1.5 months previously where she missed a step and fell backwards. No serious injuries were sustained and no other recent falls were reported.  Other motor difficulties: Denied.  Sleep History: Estimated hours obtained each night: 4 hours. This pattern of sleep was said  to  be longstanding in nature.  Difficulties falling asleep: Denied. Difficulties staying asleep: Endorsed. She reported frequently waking throughout the night or waking earlier than desired and will occasionally exhibit difficulties falling back asleep.  Feels rested and refreshed upon awakening: Endorsed. However, she will nap during the day.   History of snoring: Endorsed. Symptoms were described as mild and Ms. Poss noted sometimes waking up with a very dry mouth.  History of waking up gasping for air: Denied. Witnessed breath cessation while asleep: Denied.  History of vivid dreaming: Denied. Excessive movement while asleep: Denied. Instances of acting out her dreams: Denied.  Psychiatric/Behavioral Health History: Depression: Denied. She described her current mood as "good" and denied previous concerns surrounding a depressive disorder. Current or remote suicidal ideation, intent, or plan was likewise denied.  Anxiety: Endorsed. Generalized symptoms of anxiety were said to be longstanding and generally mild in nature. Her daughter noted that her mother had a long history of utilizing Xanax for anxiety symptoms. However, this medication was discontinued during a her recent hospitalization. Medication efforts (i.e., Seroquel) was said to be helpful at managing these symptoms presently.  Mania: Denied.  Trauma History: Denied. Visual/auditory hallucinations: She reported the presence of visual hallucinations while in the hospital with ongoing symptoms of delirium. Her daughter noted that these symptoms had subsided by the time she was discharged to rehab. Hallucinations before experienced delirium were denied, as were subsequent symptoms since that time.  Delusional thoughts: Denied.  Tobacco: Denied. Alcohol: She denied current alcohol consumption as well as a history of problematic alcohol abuse or dependence.  Recreational drugs: Denied. Caffeine: 1 cup of coffee in the morning as well  as an occasional soda throughout the day.   Family History: Problem Relation Age of Onset  . Heart disease Sister   . Breast cancer Sister   . Heart disease Father   . Breast cancer Daughter   . Breast cancer Maternal Aunt   . Breast cancer Maternal Grandmother   . Pancreatic cancer Sister    This information was confirmed by Ms. Mokry.  Academic/Vocational History: Highest level of educational attainment: 12 years. She graduated from high school and described herself as an average (A/B/C) student in academic settings. Math was noted as a longstanding relative weakness.  History of developmental delay: Denied. History of grade repetition: Denied. Enrollment in special education courses: Denied. History of LD/ADHD: Denied.  Employment: Retired. She previously worked as a Environmental education officer at [employer].   Evaluation Results:   Behavioral Observations: Ms. Farid was accompanied by her daughter, arrived to her appointment on time, and was appropriately dressed and groomed. She appeared alert and oriented. Observed gait and station were broadly within normal limits. Gross motor functioning appeared intact upon informal observation and no abnormal movements (e.g., tremors) were noted. Her affect was generally relaxed and positive, but did range appropriately given the subject being discussed during the clinical interview or the task at hand during testing procedures. Spontaneous speech was fluent and word finding difficulties were not observed during the clinical interview. Thought processes were coherent, organized, and normal in content. Hearing loss was apparent during both clinical interview and during testing procedures. Insight into her cognitive difficulties appeared limited seeing as objective deficits did emerge despite Ms. Hibbert denying all concerns during interview. During testing, sustained attention was appropriate. Task engagement was adequate and she persisted when challenged.  Overall, Ms. Yelland was cooperative with the clinical interview and subsequent testing procedures.   Adequacy of Effort: The  validity of neuropsychological testing is limited by the extent to which the individual being tested may be assumed to have exerted adequate effort during testing. Ms. Rollo expressed her intention to perform to the best of her abilities and exhibited adequate task engagement and persistence. Scores across stand-alone and embedded performance validity measures were within expectation. As such, the results of the current evaluation are believed to be a valid representation of Ms. Hiraldo' current cognitive functioning.  Test Results: Ms. Stillson was fully oriented at the time of the current evaluation.  Intellectual abilities based upon educational and vocational attainment were estimated to be in the average range. Premorbid abilities were estimated to be within the average range based upon a single-word reading test.   Processing speed was well below average to below average, representing a relative weakness across testing. Basic attention was average. More complex attention (e.g., working memory) was below average to average. Executive functioning was variable but largely below expectation. Impairments were exhibited across cognitive flexibility and response inhibition. Safety/judgment was average.   Assessed receptive language abilities were above average. Likewise, Ms. Goodson did not exhibit any difficulties comprehending task instructions and answered all questions asked of her appropriately. Assessed expressive language were variable. Phonemic fluency was below average, semantic fluency was exceptionally low to well below average, and confrontation naming was above average across a screening instrument and below average across a more comprehensive task.    Assessed visuospatial/visuoconstructional abilities were average outside of her drawing of a clock. Points were lost on  her drawing of a clock due to notable spatial abnormalities in numerical placement, as well as incorrect hand placement.    Learning (i.e., encoding) of novel verbal information was average. Spontaneous delayed recall (i.e., retrieval) of previously learned information was below average to average. Retention rates were 75% across a story learning task, 14% across a list learning task, and 50% across a shape learning task. Performance across recognition tasks was below average to average, suggesting evidence for information consolidation.   Results of emotional screening instruments suggested that recent symptoms of generalized anxiety were in the minimal range, while symptoms of depression were within normal limits. A screening instrument assessing recent sleep quality suggested the presence of minimal sleep dysfunction.  Tables of Scores:   Note: This summary of test scores accompanies the interpretive report and should not be considered in isolation without reference to the appropriate sections in the text. Descriptors are based on appropriate normative data and may be adjusted based on clinical judgment. The terms "impaired" and "within normal limits (WNL)" are used when a more specific level of functioning cannot be determined.       Effort Testing:   DESCRIPTOR       Dot Counting Test: --- --- Within Expectation  RBANS Effort Index: --- --- Within Expectation  WAIS-IV Reliable Digit Span: --- --- Within Expectation  D-KEFS Color Word Effort Index: --- --- Within Expectation       Orientation:      Raw Score Percentile   NAB Orientation, Form 1 29/29 --- ---       Cognitive Screening:           Raw Score Percentile   SLUMS: 17/30 --- ---       RBANS, Form A: Standard Score/ Scaled Score Percentile   Total Score 83 13 Below Average  Immediate Memory 90 25 Average    List Learning 9 37 Average    Story Memory 8 25 Average  Visuospatial/Constructional 100  50 Average    Figure Copy 8  25 Average    Line Orientation 17/20 51-75 Average  Language 83 13 Below Average    Picture Naming 10/10 >75 Above Average    Semantic Fluency 3 1 Exceptionally Low  Attention 85 16 Below Average    Digit Span 9 37 Average    Coding 6 9 Below Average  Delayed Memory 78 7 Well Below Average    List Recall 1/10 10-16 Below Average    List Recognition 17/20 10-16 Below Average    Story Recall 8 25 Average    Story Recognition 11/12 68-86 Average    Figure Recall 7 16 Below Average    Figure Recognition 5/8 39-57 Average       Intellectual Functioning:           Standard Score Percentile   Test of Premorbid Functioning: 93 32 Average       Attention/Executive Function:          Trail Making Test (TMT): Raw Score  (Scaled Score) Percentile     Part A 109 secs.,  0 errors (4) 2 Well Below Average    Part B Discontinued --- Impaired  *Based on [employer] Older Normative Studies (MOANS)           Scaled Score Percentile   WAIS-IV Digit Span: 8 25 Average    Forward 11 63 Average    Backward 9 37 Average    Sequencing 6 9 Below Average        Scaled Score Percentile   WAIS-IV Similarities: 7 16 Below Average       D-KEFS Color-Word Interference Test: Raw Score (Scaled Score) Percentile     Color Naming 47 secs. (6) 9 Below Average    Word Reading 32 secs. (7) 16 Below Average    Inhibition 96 secs. (10) 50 Average      Total Errors 14 errors (2) <1 Exceptionally Low    Inhibition/Switching 120 secs. (7) 16 Below Average      Total Errors 12 errors (2) <1 Exceptionally Low       NAB Executive Functions Module, Form 1: T Score Percentile     Judgment 56 73 Average       Language:          Verbal Fluency Test: Raw Score  (Scaled Score) Percentile     Phonemic Fluency (CFL) 21 (7) 16 Below Average    Category Fluency 22 (5) 5 Well Below Average  *Based on [employer] Older Normative Studies (MOANS)          NAB Language Module, Form 1: T Score Percentile     Auditory  Comprehension 57 75 Above Average    Naming 26/31 (41) 18 Below Average       Visuospatial/Visuoconstruction:      Raw Score Percentile   Clock Drawing: 6/10 --- Impaired        Scaled Score Percentile   WAIS-IV Block Design: 11 63 Average       Mood and Personality:      Raw Score Percentile   Geriatric Depression Scale: 0 --- Within Normal Limits  Geriatric Anxiety Scale: 7 --- Minimal    Somatic 6 --- Mild    Cognitive 1 --- Minimal    Affective 0 --- Minimal       Additional Questionnaires:      Raw Score Percentile   PROMIS Sleep Disturbance Questionnaire: 24 --- None to Slight   Informed Consent and  Coding/Compliance:   Ms. Jared was provided with a verbal description of the nature and purpose of the present neuropsychological evaluation. Also reviewed were the foreseeable risks and/or discomforts and benefits of the procedure, limits of confidentiality, and mandatory reporting requirements of this provider. The patient was given the opportunity to ask questions and receive answers about the evaluation. Oral consent to participate was provided by the patient.   This evaluation was conducted by Newman Nickels, Ph.D., licensed clinical neuropsychologist. Ms. Mells completed a comprehensive clinical interview with Dr. Milbert Coulter, billed as one unit [phone], and 150 minutes of cognitive testing and scoring, billed as one unit [phone] and four additional units [id number]. Psychometrist Wallace Keller, B.S., assisted Dr. Milbert Coulter with test administration and scoring procedures. As a separate and discrete service, Dr. Milbert Coulter spent a total of 160 minutes in interpretation and report writing billed as one unit [phone] and two units [id number].

## 2020-01-30 ENCOUNTER — Other Ambulatory Visit: Payer: Self-pay

## 2020-01-30 ENCOUNTER — Ambulatory Visit (INDEPENDENT_AMBULATORY_CARE_PROVIDER_SITE_OTHER): Payer: Medicare Other | Admitting: Psychology

## 2020-01-30 DIAGNOSIS — F0670 Mild neurocognitive disorder due to known physiological condition without behavioral disturbance: Secondary | ICD-10-CM

## 2020-01-30 DIAGNOSIS — G3184 Mild cognitive impairment, so stated: Secondary | ICD-10-CM | POA: Diagnosis not present

## 2020-01-30 DIAGNOSIS — Z87898 Personal history of other specified conditions: Secondary | ICD-10-CM

## 2020-01-30 NOTE — Patient Instructions (Signed)
A repeat neuropsychological evaluation in 18 months (or sooner if functional decline is noted) is recommended to assess the trajectory of future cognitive decline should it occur. This will also aid in future efforts towards improved diagnostic clarity.  Treating ongoing hearing loss with hearing aids will be vital. Recent studies have suggested that uncorrected hearing loss can elevated one's risk for the future development of dementia. Charlene Dickerson is strongly encouraged to follow the recommendations of Charlene recent hearing evaluation and seek out appropriate intervention.   Charlene Dickerson and Charlene Dickerson reported noted benefits stemming from engaging in outpatient speech therapy. I would strongly encourage Charlene to continue these services on an outpatient basis, particularly those which emphasize compensatory strategies dealing with executive functioning, memory retrieval, and adaptive functioning.   Primary vascular etiologies are often stable over time relative to neurodegenerative processes. However, should there be a progression of Charlene current deficits, Charlene Dickerson is unlikely to regain any independent living skills lost. Therefore, it is recommended that she remain as involved as possible in all aspects of household chores, finances, and medication management, with supervision to ensure adequate performance if necessary. She will likely benefit from the establishment and maintenance of a routine in order to maximize Charlene functional abilities over time.  Performance across neurocognitive testing is not a strong predictor of an individual's safety operating a motor vehicle. However, she did demonstrate deficits with executive functioning and processing speed, which certainly raises some concerns. I would recommend that she pursue a formalized driving evaluation. Charlene and/or Charlene family should Forensic psychologist in [place], [place] at [phone].Another option would be through  [facility]; however, the latter would likely require a referral from a medical doctor. [facility] can be reached directly at [phone].  Charlene Dickerson is encouraged to attend to lifestyle factors for brain health (e.g., regular physical exercise, good nutrition habits, regular participation in cognitively-stimulating activities, and general stress management techniques), which are likely to have benefits for both emotional adjustment and cognition. Optimal control of vascular risk factors (including safe cardiovascular exercise and adherence to dietary recommendations) is encouraged. Continued participation in activities which provide mental stimulation and social interaction is also recommended.   When learning new information, she would benefit from information being broken up into small, manageable pieces. She may also find it helpful to articulate the material in Charlene own words and in a context to promote encoding at the onset of a new task. This material may need to be repeated multiple times to promote encoding.  Memory can be improved using internal strategies such as rehearsal, repetition, chunking, mnemonics, association, and imagery. External strategies such as written notes in a consistently used memory journal, visual and nonverbal auditory cues such as a calendar on the refrigerator or appointments with alarm, such as on a cell phone, can also help maximize recall.    To address problems with processing speed, she may wish to consider:   -Ensuring that she is alerted when essential material or instructions are being presented   -Allowing additional processing time or a chance to rehearse novel information   -Allowing for more time in comprehending, processing, and responding in conversation  To address problems with executive dysfunction, she may wish to consider:   -Avoiding external distractions when needing to concentrate   -Limiting exposure to fast paced environments with  multiple sensory demands   -Writing down complicated information and using checklists   -Attempting and completing one task at a time (i.e., no multi-tasking)   -  Verbalizing aloud each step of a task to maintain focus   -Taking frequent breaks during the completion of steps/tasks to avoid fatigue   -Reducing the amount of information considered at one time

## 2020-01-30 NOTE — Progress Notes (Signed)
   Neuropsychology Feedback Session Charlene Dickerson. [facility] Department of Neurology  Reason for Referral:   Charlene Dickerson a 86 y.o. right-handed Caucasian female referred by Joycelyn Schmid, M.D.,to characterize hercurrent cognitive functioning and assist with diagnostic clarity and treatment planning in the context of subjective cognitive decline, a history of delirium with visual/auditory hallucinations, and several cardiovascular comorbidities.   Feedback:   Charlene Dickerson completed a comprehensive neuropsychological evaluation on 01/23/2020. Please refer to that encounter for the full report and recommendations. Briefly, results suggested primary impairments surrounding executive functioning and semantic fluency. A relative weakness was additionally exhibited across processing speed, while variability was exhibited across retrieval aspects of memory. The etiology for cognitive weakness is likely multifactorial in nature. She was hospitalized in August 2021 and experienced symptoms of delirium. This presentation can be accompanied by persisting and sometimes permanent cognitive dysfunction, especially affecting executive functioning. Additionally, her pattern of performance across testing is somewhat consistent with a primary vascular etiology. She has numerous cardiovascular conditions in her medical history and this etiology would be consistent with reports that deficits started following her heart attack in 2019.   Charlene Dickerson was accompanied by her daughter during the current feedback appointment. Content of the current session focused on the results of her neuropsychological evaluation. Charlene Dickerson and her daughter were given the opportunity to ask questions and their questions were answered. They were encouraged to reach out should additional questions arise. A copy of her report was provided at the conclusion of the visit.      30 minutes were spent conducting the current  feedback session with Charlene Dickerson, billed as one unit [phone].

## 2020-03-13 ENCOUNTER — Other Ambulatory Visit: Payer: Medicare Other

## 2020-03-18 ENCOUNTER — Other Ambulatory Visit: Payer: Self-pay

## 2020-03-18 ENCOUNTER — Emergency Department (HOSPITAL_COMMUNITY)
Admission: EM | Admit: 2020-03-18 | Discharge: 2020-03-18 | Disposition: A | Discharge status: Home / Self Care | Payer: Medicare Other | Attending: Emergency Medicine | Admitting: Emergency Medicine

## 2020-03-18 DIAGNOSIS — Z79899 Other long term (current) drug therapy: Secondary | ICD-10-CM | POA: Insufficient documentation

## 2020-03-18 DIAGNOSIS — I2510 Atherosclerotic heart disease of native coronary artery without angina pectoris: Secondary | ICD-10-CM | POA: Insufficient documentation

## 2020-03-18 DIAGNOSIS — I110 Hypertensive heart disease with heart failure: Secondary | ICD-10-CM | POA: Diagnosis not present

## 2020-03-18 DIAGNOSIS — Z87891 Personal history of nicotine dependence: Secondary | ICD-10-CM | POA: Diagnosis not present

## 2020-03-18 DIAGNOSIS — Z7901 Long term (current) use of anticoagulants: Secondary | ICD-10-CM | POA: Diagnosis not present

## 2020-03-18 DIAGNOSIS — I5032 Chronic diastolic (congestive) heart failure: Secondary | ICD-10-CM | POA: Diagnosis not present

## 2020-03-18 DIAGNOSIS — R4701 Aphasia: Secondary | ICD-10-CM

## 2020-03-18 LAB — CBC WITH DIFFERENTIAL/PLATELET
Abs Immature Granulocytes: 0.01 10*3/uL (ref 0.00–0.07)
Basophils Absolute: 0 10*3/uL (ref 0.0–0.1)
Basophils Relative: 0 %
Eosinophils Absolute: 0.1 10*3/uL (ref 0.0–0.5)
Eosinophils Relative: 2 %
HCT: 40.5 % (ref 36.0–46.0)
Hemoglobin: 13.5 g/dL (ref 12.0–15.0)
Immature Granulocytes: 0 %
Lymphocytes Relative: 17 %
Lymphs Abs: 0.8 10*3/uL (ref 0.7–4.0)
MCH: 33.5 pg (ref 26.0–34.0)
MCHC: 33.3 g/dL (ref 30.0–36.0)
MCV: 100.5 fL — ABNORMAL HIGH (ref 80.0–100.0)
Monocytes Absolute: 0.5 10*3/uL (ref 0.1–1.0)
Monocytes Relative: 11 %
Neutro Abs: 3.2 10*3/uL (ref 1.7–7.7)
Neutrophils Relative %: 70 %
Platelets: 157 10*3/uL (ref 150–400)
RBC: 4.03 MIL/uL (ref 3.87–5.11)
RDW: 13 % (ref 11.5–15.5)
WBC: 4.5 10*3/uL (ref 4.0–10.5)
nRBC: 0 % (ref 0.0–0.2)

## 2020-03-18 LAB — URINALYSIS, ROUTINE W REFLEX MICROSCOPIC
Bacteria, UA: NONE SEEN
Bilirubin Urine: NEGATIVE
Glucose, UA: NEGATIVE mg/dL
Hgb urine dipstick: NEGATIVE
Ketones, ur: NEGATIVE mg/dL
Nitrite: NEGATIVE
Protein, ur: NEGATIVE mg/dL
Specific Gravity, Urine: 1.009 (ref 1.005–1.030)
pH: 5 (ref 5.0–8.0)

## 2020-03-18 LAB — BASIC METABOLIC PANEL
Anion gap: 7 (ref 5–15)
BUN: 17 mg/dL (ref 8–23)
CO2: 27 mmol/L (ref 22–32)
Calcium: 9.3 mg/dL (ref 8.9–10.3)
Chloride: 106 mmol/L (ref 98–111)
Creatinine, Ser: 0.87 mg/dL (ref 0.44–1.00)
GFR, Estimated: >60 mL/min (ref >60)
Glucose, Bld: 91 mg/dL (ref 70–99)
Potassium: 4.3 mmol/L (ref 3.5–5.1)
Sodium: 140 mmol/L (ref 135–145)

## 2020-03-18 LAB — CBG MONITORING, ED: Glucose-Capillary: 81 mg/dL (ref 70–99)

## 2020-03-18 NOTE — ED Triage Notes (Signed)
Per ems pt states her son called her the this morning and she sounded different. States her speech didn't sound right. Per ems pts speech seemed at basline. pts daughter states she has issues with her speech occasionally and it resolves on its own. Pt states she felt this morning her speech was different but currently states her speech is fine. No other symptoms at this time.

## 2020-03-18 NOTE — Discharge Instructions (Signed)
All the blood work today and the urine looked normal.  May be this episode today was a small TIA but your blood pressure and everything else looks normal.  All the recent head scans that you have had have also looked pretty good.  If you have return of symptoms and it does not go away or you start having severe headache or symptoms involve your arm or your leg please return to the emergency room

## 2020-03-18 NOTE — ED Provider Notes (Signed)
[facility] EMERGENCY DEPARTMENT Provider Note   CSN: [id number] Arrival date & time: 03/18/20  1037     History Chief Complaint  Patient presents with  . Aphasia    Per ems son states he thought his mom had speech issues this morning.     RUPAL WETHERELL is a 87 y.o. female.  Patient is an 87 year old female with a history of CAD, CHF, complete heart block, TIA who is presenting today for concerns for a brief episode of abnormal speech.  Patient reports that she woke up this morning feeling her normal self.  She took her blood pressure medication and drink water the way she normally does and she was talking to her son on the phone which she states initially she sounded normal and then her speech became slurred and garbled and he became worried.  Patient reports the symptoms lasted from 3 to 5 minutes and then resolved.  During that time she had no tingling or numbness in her face or extremities.  She had no difficulty walking or change in vision.  She had no swallowing issues.  She denied any headache or neck pain.  She reports since the symptoms resolved she has had no recurrent symptoms.  Currently she reports that she feels fine.  She had her losartan changed in September when she came for headache, neck pain and some facial numbness and weakness.  At that time she had a CTA of her head, neck and an MRI of her brain all without acute findings.  The month before that she was admitted for TIA work-up which was relatively benign.  She has been following up with her neurologist Dr. Levada Dy as well as her cardiologist Dr. Elease Hashimoto and has been doing overall well.  When she woke up this morning she takes her blood pressure every morning and it was normal this morning.  Blood sugar was normal per EMS.  Patient denies any recent illness.  She did test positive for COVID approximately 20 days ago but had no symptoms throughout the episode and still denies any cough, shortness of breath,  fever or other issues.  The history is provided by the patient, the EMS personnel, a relative and medical records.       Past Medical History:  Diagnosis Date  . AKI (acute kidney injury) 06/20/2019  . CAD (coronary artery disease) 06/20/2019  . Chronic diastolic CHF (congestive heart failure) 06/20/2019  . Closed compression fracture of L4 lumbar vertebra 06/20/2019  . Complete heart block   . Cystocele with rectocele 11/26/2017  . Diffuse cystic mastopathy of breast 05/09/2011  . Diverticulosis of colon   . Essential hypertension 06/20/2019  . Generalized anxiety disorder   . GERD (gastroesophageal reflux disease)    occasional (no meds)  . History of basal cell carcinoma excision 2012  . History of delirium   . Hyperkalemia 06/20/2019  . Low back pain 06/01/2019  . Malnutrition of moderate degree 06/21/2019  . Metabolic acidosis 06/20/2019  . Microscopic hematuria 01/23/2020  . Mild neurocognitive disorder due to multiple etiologies 01/23/2020   Likely a combination of a vascular etiology and deficits related to prior delirium  . Osteoporosis   . Paroxysmal atrial fibrillation   . PONV (postoperative nausea and vomiting)   . Shock circulatory   . STEMI (ST elevation myocardial infarction) 02/18/2018  . Transient ischemic attack (TIA)   . Uremia 06/20/2019  . Ventricular tachycardia 07/15/2019  . Vitamin B12 deficiency  Patient Active Problem List   Diagnosis Date Noted  . Microscopic hematuria 01/23/2020  . Mild neurocognitive disorder due to multiple etiologies 01/23/2020  . Transient ischemic attack (TIA)   . History of delirium   . Generalized anxiety disorder   . Vitamin B12 deficiency   . Ventricular tachycardia 07/15/2019  . Malnutrition of moderate degree 06/21/2019  . Hyperkalemia 06/20/2019  . Metabolic acidosis 06/20/2019  . Uremia 06/20/2019  . CAD (coronary artery disease) 06/20/2019  . Dehydration 06/20/2019  . Chronic diastolic CHF (congestive heart  failure) 06/20/2019  . Essential hypertension 06/20/2019  . Low back pain 06/01/2019  . Paroxysmal atrial fibrillation   . Shock circulatory   . STEMI (ST elevation myocardial infarction) 02/18/2018  . Complete heart block   . Cystocele with rectocele 11/26/2017  . Diffuse cystic mastopathy of breast 05/09/2011    Past Surgical History:  Procedure Laterality Date  . BREAST EXCISIONAL BIOPSY Left 1995  . BREAST EXCISIONAL BIOPSY Right 1988  . CARDIOVERSION N/A 04/16/2018   Procedure: CARDIOVERSION;  Surgeon: Parke Poisson, MD;  Location: [facility] ENDOSCOPY;  Service: Cardiovascular;  Laterality: N/A;  . CATARACT EXTRACTION W/ INTRAOCULAR LENS  IMPLANT, BILATERAL  right 09/ 2013;  left 10/ 2013  . COLONOSCOPY  last one 04/10/ 2012  . CORONARY STENT INTERVENTION N/A 02/18/2018   Procedure: CORONARY STENT INTERVENTION;  Surgeon: Runell Gess, MD;  Location: [facility] INVASIVE CV LAB;  Service: Cardiovascular;  Laterality: N/A;  . D & C HYSTEROSCOPY W/ RESECTION ENDOMETRIAL POLYP AND LESION  07-27-2007   dr Ambrose Mantle @[facility]   . IR KYPHO LUMBAR INC FX REDUCE BONE BX UNI/BIL CANNULATION INC/IMAGING  07/11/2019  . LEFT HEART CATH AND CORONARY ANGIOGRAPHY N/A 02/18/2018   Procedure: LEFT HEART CATH AND CORONARY ANGIOGRAPHY;  Surgeon: 02/20/2018, MD;  Location: [facility] INVASIVE CV LAB;  Service: Cardiovascular;  Laterality: N/A;  . RECTOCELE REPAIR N/A 11/26/2017   Procedure: POSTERIOR REPAIR (RECTOCELE);  Surgeon: 11/28/2017, MD;  Location[facility];  Service: Gynecology;  Laterality: N/A;  OUTPT IN BED  . TEMPORARY PACEMAKER N/A 02/18/2018   Procedure: TEMPORARY PACEMAKER;  Surgeon: 02/20/2018, MD;  Location: [facility] INVASIVE CV LAB;  Service: Cardiovascular;  Laterality: N/A;  . TUBAL LIGATION Bilateral yrs ago     OB History   No obstetric history on file.     Family History  Problem Relation Age of Onset  . Heart disease Sister   . Breast cancer Sister   . Heart  disease Father   . Breast cancer Daughter   . Breast cancer Maternal Aunt   . Breast cancer Maternal Grandmother   . Pancreatic cancer Sister     Social History   Tobacco Use  . Smoking status: Former Smoker    Quit date: 05/08/1961    Years since quitting: 58.9  . Smokeless tobacco: Never Used  Vaping Use  . Vaping Use: Never used  Substance Use Topics  . Alcohol use: No  . Drug use: Never    Home Medications Prior to Admission medications   Medication Sig Start Date End Date Taking? Authorizing Provider  apixaban (ELIQUIS) 5 MG TABS tablet Take 1 tablet (5 mg total) by mouth 2 (two) times daily. 10/25/19 12/07/19  02/06/20, [place], DO  atorvastatin (LIPITOR) 40 MG tablet Take 1 tablet (40 mg total) by mouth daily with supper. 10/25/19 12/07/19  02/06/20, [place], DO  calcium citrate (CALCITRATE - DOSED IN MG ELEMENTAL CALCIUM) 950 MG  tablet Take 200 mg of elemental calcium by mouth daily.    [provider]  docusate sodium (COLACE) 100 MG capsule Take 100 mg by mouth daily as needed for mild constipation.     [provider]  feeding supplement, ENSURE ENLIVE, (ENSURE ENLIVE) LIQD Take 237 mLs by mouth 2 (two) times daily between meals. 06/24/19   Rolly Salter, MD  furosemide (LASIX) 20 MG tablet Take 0.5 tablets (10 mg total) by mouth in the morning. 10/26/19 12/07/19  [place], Alvira Philips, DO  hydrALAZINE (APRESOLINE) 10 MG tablet Take 1 tablet (10 mg total) by mouth 3 (three) times daily as needed (significantly elevated Blood pressures). 11/23/19   Tegeler, Canary Brim, MD  latanoprost (XALATAN) 0.005 % ophthalmic solution Place 1 drop into both eyes at bedtime.  04/17/11   [provider]  loratadine (CLARITIN) 10 MG tablet Take 1 tablet (10 mg total) by mouth daily. 10/25/19 12/07/19  [place], Alvira Philips, DO  losartan (COZAAR) 50 MG tablet Take 50 mg by mouth daily. 11/14/19   [provider]  Multiple Vitamin (MULTIVITAMIN WITH MINERALS) TABS tablet Take 1  tablet by mouth daily.    [provider]  nitroGLYCERIN (NITROSTAT) 0.4 MG SL tablet Place 1 tablet (0.4 mg total) under the tongue every 5 (five) minutes as needed for chest pain. 10/25/19 12/07/19  [place], Eric J, DO  pantoprazole (PROTONIX) 40 MG tablet Take 1 tablet (40 mg total) by mouth at bedtime. 10/25/19 12/07/19  [place], Alvira Philips, DO  propranolol (INDERAL) 10 MG tablet Take 1 tablet (10 mg total) by mouth 2 (two) times daily with a meal. 10/25/19 12/07/19  [place], Eric J, DO  PSYLLIUM PO Take 1 capsule by mouth 2 (two) times daily.    [provider]  QUEtiapine (SEROQUEL) 25 MG tablet Take 1 tablet (25 mg total) by mouth at bedtime. 10/25/19 12/07/19  [place], Alvira Philips, DO  vitamin B-12 (CYANOCOBALAMIN) 1000 MCG tablet Take 1,000 mcg by mouth daily.    [provider]    Allergies    Clindamycin/lincomycin and Nsaids  Review of Systems   Review of Systems  All other systems reviewed and are negative.   Physical Exam Updated Vital Signs BP (!) 175/104 (BP Location: Left Arm)   Pulse 70   Temp 98.2 F (36.8 C) (Oral)   Resp (!) 21   Wt 63.5 kg   SpO2 98%   BMI 25.61 kg/m   Physical Exam Vitals and nursing note reviewed.  Constitutional:      General: She is not in acute distress.    Appearance: Normal appearance. She is well-developed, normal weight and well-nourished.  HENT:     Head: Normocephalic and atraumatic.     Nose: Nose normal.     Mouth/Throat:     Mouth: Mucous membranes are moist.  Eyes:     General: No visual field deficit.    Extraocular Movements: EOM normal.     Pupils: Pupils are equal, round, and reactive to light.  Cardiovascular:     Rate and Rhythm: Normal rate and regular rhythm.     Pulses: Intact distal pulses.     Heart sounds: Normal heart sounds. No murmur heard. No friction rub.  Pulmonary:     Effort: Pulmonary effort is normal.     Breath sounds: Normal breath sounds. No wheezing or rales.  Abdominal:      General: Bowel sounds are normal. There is no distension.     Palpations:  Abdomen is soft.     Tenderness: There is no abdominal tenderness. There is no guarding or rebound.  Musculoskeletal:        General: No tenderness. Normal range of motion.     Cervical back: Normal range of motion and neck supple.     Comments: No edema  Skin:    General: Skin is warm and dry.     Findings: No rash.  Neurological:     General: No focal deficit present.     Mental Status: She is alert and oriented to person, place, and time. Mental status is at baseline.     Cranial Nerves: No cranial nerve deficit, dysarthria or facial asymmetry.     Sensory: Sensation is intact.     Motor: Motor function is intact. No weakness or pronator drift.     Coordination: Coordination is intact. Finger-Nose-Finger Test and Heel to [facility] Test normal.     Comments: Mild resting tremor that resolves with intention  Psychiatric:        Mood and Affect: Mood and affect and mood normal.        Behavior: Behavior normal.        Thought Content: Thought content normal.     ED Results / Procedures / Treatments   Labs (all labs ordered are listed, but only abnormal results are displayed) Labs Reviewed  URINALYSIS, ROUTINE W REFLEX MICROSCOPIC - Abnormal; Notable for the following components:      Result Value   Leukocytes,Ua TRACE (*)    All other components within normal limits  CBC WITH DIFFERENTIAL/PLATELET - Abnormal; Notable for the following components:   MCV 100.5 (*)    All other components within normal limits  BASIC METABOLIC PANEL  CBG MONITORING, ED    EKG EKG Interpretation  Date/Time:  Sunday March 18 2020 10:46:38 EST Ventricular Rate:  75 PR Interval:    QRS Duration: 90 QT Interval:  387 QTC Calculation: 433 R Axis:   -6 Text Interpretation: Sinus rhythm Borderline prolonged PR interval No significant change since last tracing Confirmed by Gwyneth SproutPlunkett, Mariane Burpee ([id number]) on 03/18/2020 10:59:11  AM   Radiology No results found.  Procedures Procedures (including critical care time)  Medications Ordered in ED Medications - No data to display  ED Course  I have reviewed the triage vital signs and the nursing notes.  Pertinent labs & imaging results that were available during my care of the patient were reviewed by me and considered in my medical decision making (see chart for details).    MDM Rules/Calculators/A&P                          Elderly female presenting today with a brief episode of slurred and garbled speech that lasted 3 to 5 minutes.  Upon arrival here patient's symptoms are resolved.  Patient reports no recent illnesses or issues.  She is well-appearing on exam with a normal neurologic exam at this time.  She denies any recent urinary symptoms cough or congestion.  Daughter reports she did have a COVID-positive contact on December 23 from her grandson.  She has received both COVID vaccines and the booster.  She did test positive for COVID on December 29 but has never developed any symptoms and is now 20 days out from her initial test.  It is unlikely that this has anything to do with the episode today.  Initially blood pressure read as 175/104 however with recheck it is  140/79.  She has been taking her blood pressure medications as prescribed and checks her blood pressure daily which it has been relatively normal.  Discussed the findings with neurology and given her extensive work-up in August and September low suspicion that patient is having an acute stroke or occlusion.  Do not feel necessary that patient needs further imaging with CT or MRI at this time.  Spoke with Dr. Jerrell Belfast who agrees with this assessment.  He did recommend checking a urine as patient did have urinary tract infection several days prior to her symptoms starting in the past.  She denies urinary symptoms but will check a CBC BMP and UA just to ensure no electrolyte abnormalities, stable renal function  and no evidence of UTI.  1:18 PM Pt's labs wnl. Pt still feeling well. Will d/c home.  MDM Number of Diagnoses or Management Options   Amount and/or Complexity of Data Reviewed Clinical lab tests: ordered and reviewed Tests in the medicine section of CPT: ordered and reviewed Decide to obtain previous medical records or to obtain history from someone other than the patient: yes Obtain history from someone other than the patient: yes Review and summarize past medical records: yes Discuss the patient with other providers: no Independent visualization of images, tracings, or specimens: yes  Risk of Complications, Morbidity, and/or Mortality Presenting problems: moderate Diagnostic procedures: low Management options: minimal  Patient Progress Patient progress: stable   Final Clinical Impression(s) / ED Diagnoses Final diagnoses:  Aphasia    Rx / DC Orders ED Discharge Orders    None       Gwyneth Sprout, MD 03/18/20 1319

## 2020-06-05 NOTE — Progress Notes (Signed)
Chief Complaint  Patient presents with  . Follow-up    RM 2 with daughter (Charlene Dickerson) Pt is well, no complaints      HISTORY OF PRESENT ILLNESS: 06/06/20 ALL:  Charlene Dickerson is a 87 y.o. female here today for follow up for [facility]. She reports that she is doing well. She feels that memory is good. She found benefit working with ST. Neurocog eval concerning for possible vascular etiology. She lives independently. She drives without difficulty. She manages medicaitons and finances without difficulty. She is not interested in starting any new medicaitons. She loves to walk.    HISTORY (copied from Dr Charlene Dickerson previous note)  87 year old female with coronary artery disease, CHF, A. fib, presented to the hospital for word Hunt difficulties and confusion in August 2021.  Patient had been in fairly good health until about 87 years old.  Then she was having issues with blood pressure fluctuation, confusion and heart issues.  She had a heart attack in December 2019.  Since that time she has had problems with sleep, personality, mood and memory.  Patient lives independently.  Patient presented to hospital due to fluctuating confusion and word finding difficulties.  She was taken to the hospital for evaluation and code stroke was activated.  Patient was admitted for further evaluation.  Stroke work-up was completed.  Possibility of underlying neurodegenerative process was raised.  Patient had low B12 level of 189 and was started on replacement.  Patient was having delirium and visual hallucinations treated with Seroquel.  Since that time symptoms have improved but continue to fluctuate.  She has issues with multitasking, short-term memory, problem-solving, attention and language.    REVIEW OF SYSTEMS: Out of a complete 14 system review of symptoms, the patient complains only of the following symptoms, low back pain, short term memory loss  and all other reviewed systems are  negative.    ALLERGIES: Allergies  Allergen Reactions  . Clindamycin/Lincomycin Diarrhea and Other (See Comments)    "c-diff"  . Nsaids Other (See Comments)    Was told to not take these     HOME MEDICATIONS: Outpatient Medications Prior to Visit  Medication Sig Dispense Refill  . apixaban (ELIQUIS) 5 MG TABS tablet Take 1 tablet (5 mg total) by mouth 2 (two) times daily. 60 tablet 0  . atorvastatin (LIPITOR) 40 MG tablet Take 1 tablet (40 mg total) by mouth daily with supper. 30 tablet 0  . calcium citrate (CALCITRATE - DOSED IN MG ELEMENTAL CALCIUM) 950 MG tablet Take 200 mg of elemental calcium by mouth daily.    . feeding supplement, ENSURE ENLIVE, (ENSURE ENLIVE) LIQD Take 237 mLs by mouth 2 (two) times daily between meals. 237 mL 12  . hydrALAZINE (APRESOLINE) 10 MG tablet Take 1 tablet (10 mg total) by mouth 3 (three) times daily as needed (significantly elevated Blood pressures). 21 tablet 0  . latanoprost (XALATAN) 0.005 % ophthalmic solution Place 1 drop into both eyes at bedtime.     Marland Kitchen loratadine (CLARITIN) 10 MG tablet Take 1 tablet (10 mg total) by mouth daily. 30 tablet 0  . losartan (COZAAR) 50 MG tablet Take 50 mg by mouth daily.    . Multiple Vitamin (MULTIVITAMIN WITH MINERALS) TABS tablet Take 1 tablet by mouth daily.    . nitroGLYCERIN (NITROSTAT) 0.4 MG SL tablet Place 1 tablet (0.4 mg total) under the tongue every 5 (five) minutes as needed for chest pain. 25 tablet 3  . propranolol (INDERAL) 10 MG  tablet Take 1 tablet (10 mg total) by mouth 2 (two) times daily with a meal. 60 tablet 0  . PSYLLIUM PO Take 1 capsule by mouth 2 (two) times daily.    . QUEtiapine (SEROQUEL) 25 MG tablet Take 1 tablet (25 mg total) by mouth at bedtime. 30 tablet 0  . vitamin B-12 (CYANOCOBALAMIN) 1000 MCG tablet Take 1,000 mcg by mouth daily.    . furosemide (LASIX) 20 MG tablet Take 0.5 tablets (10 mg total) by mouth in the morning. 15 tablet 0  . docusate sodium (COLACE) 100 MG  capsule Take 100 mg by mouth daily as needed for mild constipation.     . pantoprazole (PROTONIX) 40 MG tablet Take 1 tablet (40 mg total) by mouth at bedtime. (Patient not taking: Reported on 06/06/2020) 30 tablet 0   No facility-administered medications prior to visit.     PAST MEDICAL HISTORY: Past Medical History:  Diagnosis Date  . AKI (acute kidney injury) 06/20/2019  . CAD (coronary artery disease) 06/20/2019  . Chronic diastolic CHF (congestive heart failure) 06/20/2019  . Closed compression fracture of L4 lumbar vertebra 06/20/2019  . Complete heart block   . Cystocele with rectocele 11/26/2017  . Diffuse cystic mastopathy of breast 05/09/2011  . Diverticulosis of colon   . Essential hypertension 06/20/2019  . Generalized anxiety disorder   . GERD (gastroesophageal reflux disease)    occasional (no meds)  . History of basal cell carcinoma excision 2012  . History of delirium   . Hyperkalemia 06/20/2019  . Low back pain 06/01/2019  . Malnutrition of moderate degree 06/21/2019  . Metabolic acidosis 06/20/2019  . Microscopic hematuria 01/23/2020  . Mild neurocognitive disorder due to multiple etiologies 01/23/2020   Likely a combination of a vascular etiology and deficits related to prior delirium  . Osteoporosis   . Paroxysmal atrial fibrillation   . PONV (postoperative nausea and vomiting)   . Shock circulatory   . STEMI (ST elevation myocardial infarction) 02/18/2018  . Transient ischemic attack (TIA)   . Uremia 06/20/2019  . Ventricular tachycardia 07/15/2019  . Vitamin B12 deficiency      PAST SURGICAL HISTORY: Past Surgical History:  Procedure Laterality Date  . BREAST EXCISIONAL BIOPSY Left 1995  . BREAST EXCISIONAL BIOPSY Right 1988  . CARDIOVERSION N/A 04/16/2018   Procedure: CARDIOVERSION;  Surgeon: Charlene Poisson, MD;  Location: [facility] ENDOSCOPY;  Service: Cardiovascular;  Laterality: N/A;  . CATARACT EXTRACTION W/ INTRAOCULAR LENS  IMPLANT, BILATERAL  right 09/  2013;  left 10/ 2013  . COLONOSCOPY  last one 04/10/ 2012  . CORONARY STENT INTERVENTION N/A 02/18/2018   Procedure: CORONARY STENT INTERVENTION;  Surgeon: Charlene Gess, MD;  Location: [facility] INVASIVE CV LAB;  Service: Cardiovascular;  Laterality: N/A;  . D & C HYSTEROSCOPY W/ RESECTION ENDOMETRIAL POLYP AND LESION  07-27-2007   dr Ambrose Mantle @[facility]   . IR KYPHO LUMBAR INC FX REDUCE BONE BX UNI/BIL CANNULATION INC/IMAGING  07/11/2019  . LEFT HEART CATH AND CORONARY ANGIOGRAPHY N/A 02/18/2018   Procedure: LEFT HEART CATH AND CORONARY ANGIOGRAPHY;  Surgeon: Charlene Gess, MD;  Location: [facility] INVASIVE CV LAB;  Service: Cardiovascular;  Laterality: N/A;  . RECTOCELE REPAIR N/A 11/26/2017   Procedure: POSTERIOR REPAIR (RECTOCELE);  Surgeon: Tracey Harries, MD;  Location: [facility];  Service: Gynecology;  Laterality: N/A;  OUTPT IN BED  . TEMPORARY PACEMAKER N/A 02/18/2018   Procedure: TEMPORARY PACEMAKER;  Surgeon: Charlene Gess, MD;  Location: [facility] INVASIVE CV  LAB;  Service: Cardiovascular;  Laterality: N/A;  . TUBAL LIGATION Bilateral yrs ago     FAMILY HISTORY: Family History  Problem Relation Age of Onset  . Heart disease Sister   . Breast cancer Sister   . Heart disease Father   . Breast cancer Daughter   . Breast cancer Maternal Aunt   . Breast cancer Maternal Grandmother   . Pancreatic cancer Sister      SOCIAL HISTORY: Social History   Socioeconomic History  . Marital status: Widowed    Spouse name: Not on file  . Number of children: 2  . Years of education: 23  . Highest education level: High school graduate  Occupational History  . Occupation: Retired  Tobacco Use  . Smoking status: Former Smoker    Quit date: 05/08/1961    Years since quitting: 59.1  . Smokeless tobacco: Never Used  Vaping Use  . Vaping Use: Never used  Substance and Sexual Activity  . Alcohol use: No  . Drug use: Never  . Sexual activity: Not on file  Other Topics Concern  . Not  on file  Social History Narrative   12/07/19 lives alone   Social Determinants of Health   Financial Resource Strain: Not on file  Food Insecurity: Not on file  Transportation Needs: Not on file  Physical Activity: Not on file  Stress: Not on file  Social Connections: Not on file  Intimate Partner Violence: Not on file      PHYSICAL EXAM  Vitals:   06/06/20 1535  BP: (!) 164/89  Pulse: 67  Weight: 153 lb (69.4 kg)  Height: 5\' 1"  (1.549 m)   Body mass index is 28.91 kg/m.   Generalized: Well developed, in no acute distress  Cardiology: normal rate and rhythm, no murmur auscultated  Respiratory: clear to auscultation bilaterally    Neurological examination  Mentation: Alert oriented to time, place, history taking. Follows all commands speech and language fluent Cranial nerve II-XII: Pupils were equal round reactive to light. Extraocular movements were full, visual field were full on confrontational test. Facial sensation and strength were normal. Head turning and shoulder shrug  were normal and symmetric. Motor: The motor testing reveals 5 over 5 strength of all 4 extremities. Good symmetric motor tone is noted throughout.   Gait and station: Gait is arthritic, mildly stooped posture (reports back pain)    DIAGNOSTIC DATA (LABS, IMAGING, TESTING) - I reviewed patient records, labs, notes, testing and imaging myself where available.  Lab Results  Component Value Date   WBC 4.5 03/18/2020   HGB 13.5 03/18/2020   HCT 40.5 03/18/2020   MCV 100.5 (H) 03/18/2020   PLT 157 03/18/2020      Component Value Date/Time   NA 140 03/18/2020 1148   NA 143 04/25/2019 1602   K 4.3 03/18/2020 1148   CL 106 03/18/2020 1148   CO2 27 03/18/2020 1148   GLUCOSE 91 03/18/2020 1148   BUN 17 03/18/2020 1148   BUN 13 04/25/2019 1602   CREATININE 0.87 03/18/2020 1148   CALCIUM 9.3 03/18/2020 1148   PROT 6.6 11/23/2019 1220   PROT 7.1 04/25/2019 1602   ALBUMIN 3.9 11/23/2019 1220    ALBUMIN 4.2 04/25/2019 1602   AST 20 11/23/2019 1220   ALT 19 11/23/2019 1220   ALKPHOS 46 11/23/2019 1220   BILITOT 1.0 11/23/2019 1220   BILITOT 0.5 04/25/2019 1602   GFRNONAA >60 03/18/2020 1148   GFRAA >60 11/23/2019 1220   Lab  Results  Component Value Date   CHOL 187 10/22/2019   HDL 83 10/22/2019   LDLCALC 91 10/22/2019   TRIG 63 10/22/2019   CHOLHDL 2.3 10/22/2019   Lab Results  Component Value Date   HGBA1C 5.4 10/22/2019   Lab Results  Component Value Date   VITAMINB12 189 10/23/2019   Lab Results  Component Value Date   TSH 1.610 06/20/2019    MMSE - Mini Mental State Exam 12/07/2019  Orientation to time 5  Orientation to Place 5  Registration 3  Attention/ Calculation 1  Recall 3  Language- name 2 objects 2  Language- repeat 1  Language- follow 3 step command 3  Language- read & follow direction 1  Write a sentence 1  Copy design 0  Total score 25     No flowsheet data found.   ASSESSMENT AND PLAN  87 y.o. year old female  has a past medical history of AKI (acute kidney injury) (06/20/2019), CAD (coronary artery disease) (06/20/2019), Chronic diastolic CHF (congestive heart failure) (06/20/2019), Closed compression fracture of L4 lumbar vertebra (06/20/2019), Complete heart block, Cystocele with rectocele (11/26/2017), Diffuse cystic mastopathy of breast (05/09/2011), Diverticulosis of colon, Essential hypertension (06/20/2019), Generalized anxiety disorder, GERD (gastroesophageal reflux disease), History of basal cell carcinoma excision (2012), History of delirium, Hyperkalemia (06/20/2019), Low back pain (06/01/2019), Malnutrition of moderate degree (06/21/2019), Metabolic acidosis (06/20/2019), Microscopic hematuria (01/23/2020), Mild neurocognitive disorder due to multiple etiologies (01/23/2020), Osteoporosis, Paroxysmal atrial fibrillation, PONV (postoperative nausea and vomiting), Shock circulatory, STEMI (ST elevation myocardial infarction) (02/18/2018),  Transient ischemic attack (TIA), Uremia (06/20/2019), Ventricular tachycardia (07/15/2019), and Vitamin B12 deficiency. here with   Mild neurocognitive disorder due to multiple etiologies  Charlene Dickerson is doing well. She feels that symptoms have improved with time and memory is improved. She is not interested in adding any memory agents. She was encouraged to continue healthy lifestyle habits. She plans to follow up with neuropsychology in 1 year. She will return to see me in 1 year. Memory compensation strategies reviewed. She and her daughter verbalize understanding and and agreement with this plan.    No orders of the defined types were placed in this encounter.    No orders of the defined types were placed in this encounter.     I spent 20 minutes of face-to-face and non-face-to-face time with patient.  This included previsit chart review, lab review, study review, order entry, electronic health record documentation, patient education.    Shawnie Dapper, MSN, FNP-C 06/06/2020, 4:18 PM  [facility] [address], [address] [place], [place] [address] [phone]

## 2020-06-05 NOTE — Patient Instructions (Signed)
Below is our plan:  We will continue to monitor symptoms.   Please make sure you are staying well hydrated. I recommend 50-60 ounces daily. Well balanced diet and regular exercise encouraged. Consistent sleep schedule with 6-8 hours recommended.   Please continue follow up with care team as directed.   Follow up with me in 1 year, sooner if needed   You may receive a survey regarding today's visit. I encourage you to leave honest feed back as I do use this information to improve patient care. Thank you for seeing me today!      Memory Compensation Strategies  1. Use "WARM" strategy.  W= write it down  A= associate it  R= repeat it  M= make a mental note  2.   You can keep a Glass blower/designer.  Use a 3-ring notebook with sections for the following: calendar, important names and phone numbers,  medications, doctors' names/phone numbers, lists/reminders, and a section to journal what you did  each day.   3.    Use a calendar to write appointments down.  4.    Write yourself a schedule for the day.  This can be placed on the calendar or in a separate section of the Memory Notebook.  Keeping a  regular schedule can help memory.  5.    Use medication organizer with sections for each day or morning/evening pills.  You may need help loading it  6.    Keep a basket, or pegboard by the door.  Place items that you need to take out with you in the basket or on the pegboard.  You may also want to  include a message board for reminders.  7.    Use sticky notes.  Place sticky notes with reminders in a place where the task is performed.  For example: " turn off the  stove" placed by the stove, "lock the door" placed on the door at eye level, " take your medications" on  the bathroom mirror or by the place where you normally take your medications.  8.    Use alarms/timers.  Use while cooking to remind yourself to check on food or as a reminder to take your medicine, or as a  reminder to make a  call, or as a reminder to perform another task, etc.

## 2020-06-06 ENCOUNTER — Encounter: Payer: Self-pay | Admitting: Family Medicine

## 2020-06-06 ENCOUNTER — Ambulatory Visit (INDEPENDENT_AMBULATORY_CARE_PROVIDER_SITE_OTHER): Payer: Medicare Other | Admitting: Family Medicine

## 2020-06-06 VITALS — BP 164/89 | HR 67 | Ht 61.0 in | Wt 153.0 lb

## 2020-06-06 DIAGNOSIS — F0670 Mild neurocognitive disorder due to known physiological condition without behavioral disturbance: Secondary | ICD-10-CM

## 2020-06-06 DIAGNOSIS — G3184 Mild cognitive impairment, so stated: Secondary | ICD-10-CM

## 2020-06-09 ENCOUNTER — Encounter: Payer: Self-pay | Admitting: Cardiovascular Disease

## 2020-06-09 NOTE — Progress Notes (Signed)
Cardiology Office Note:    Date:  06/11/2020   ID:  Charlene Dickerson, DOB 02-16-1933, MRN [id number]  PCP:  Charlene Polka., MD  Cardiologist:  Kristeen Miss, MD  Electrophysiologist:  None   Referring MD: Charlene Polka., MD   Chief Complaint  Patient presents with  . Atrial Fibrillation  . Coronary Artery Disease     Jan. 10, 2020   Charlene Dickerson is a 87 y.o. female with a recent admission to the hospital with an acute inferior wall myocardial infarction.  Was complicated by right ventricular infarction.  Was seen with Charlene Dickerson, ( daughter)   She initially had lots of problems with hypotension and required Levophed for several days while in hospital.  But has made a great recovery.   Also had paroxysmal atrial fibrillation as a complication of her heart attack.  She converted back to sinus rhythm on December 24    She is now feeling quite a bit better. No syncope or presyncope.  She is not had any episodes of angina.  April 12, 2018: See seen back today for follow-up visit.  She has a history of an inferior wall myocardial infarction complicated by right ventricular myocardial infarction.  She is developed atrial fibrillation. Gaining weight and her blood pressures been elevated.  We started her on furosemide 20 mg a day . Marland Kitchen  Swelling has improved significantly after starting Lasix but she still very uncomfortable. In discussing with her daughter, Charlene Dickerson , it is clear that [name] is not putting out much urine even on the Lasix.  Feb. 22, 2021  Charlene Dickerson was seen a year ago  She has a hx of Inf. MI with RV infarction.  made a nice recovery   She has RV CHF and atrial fib She had a successful cardioversion in Feb. 2020 She had a telemedicine visit with Charlene Newcomer, PA in August, 2020  Seemed to be doing well  She had some volume excess and her lasix was increased to 40 BID  Rare episodes of dyspnea with walking . No cp  Still has some leg swelling    Some leg  swelling .   Is on lasix 20 bid,  Has frequent urination   Has ad 1st covid vaccine.     Jul 15, 2019:  [name] is seen today for follow-up visit.  She was hospitalized in mid April with generalized weakness.  She had a UTI , developed acute renal failure , hyperkalemia and then ventricular tachycardia.   While in the emergency room she developed wide-complex tachycardia that is consistent with ventricular tachycardia.  Her potassium was 7.0 at the time.  She had acute renal failure with a creatinine of 5.8.  She is she was successfully cardioverted.  She was bagged briefly but did not lose a pulse.  No CP   She is developed some mild leg edema.  While she was in rehab she was started on Lasix 10 mg a day on Mondays Wednesdays and Fridays.  This seems to control her Lasix.  Her creatinine and potassium seem to be well controlled on this regimen. Echocardiogram from June 20, 2019 reveals normal left ventricular systolic function.  She has grade 1 diastolic dysfunction.  RV function is normal.  PA pressures are at the upper limits of normal.  She has moderate aortic insufficiency.  Oct.  5, 2021:  Seen with Daughter, Charlene Dickerson.  She was in the hosptial for HTN in Aug. 2021 Is on  Losartan 50 mg ( new medication )  Was also given hydralazine 10 mg to take as needed 3 times.  May be having TIAs recently .  MRI was unremarkable Carotid duplex is normal .  June 11, 2020: Charlene Dickerson is sen today for follow up of her HTN Mancy had covid in Dec  Still has some DOE I encouraged her to ambulate more .    Past Medical History:  Diagnosis Date  . AKI (acute kidney injury) 06/20/2019  . CAD (coronary artery disease) 06/20/2019  . Chronic diastolic CHF (congestive heart failure) 06/20/2019  . Closed compression fracture of L4 lumbar vertebra 06/20/2019  . Complete heart block   . Cystocele with rectocele 11/26/2017  . Diffuse cystic mastopathy of breast 05/09/2011  . Diverticulosis of colon   . Essential  hypertension 06/20/2019  . Generalized anxiety disorder   . GERD (gastroesophageal reflux disease)    occasional (no meds)  . History of basal cell carcinoma excision 2012  . History of delirium   . Hyperkalemia 06/20/2019  . Low back pain 06/01/2019  . Malnutrition of moderate degree 06/21/2019  . Metabolic acidosis 06/20/2019  . Microscopic hematuria 01/23/2020  . Mild neurocognitive disorder due to multiple etiologies 01/23/2020   Likely a combination of a vascular etiology and deficits related to prior delirium  . Osteoporosis   . Paroxysmal atrial fibrillation   . PONV (postoperative nausea and vomiting)   . Shock circulatory   . STEMI (ST elevation myocardial infarction) 02/18/2018  . Transient ischemic attack (TIA)   . Uremia 06/20/2019  . Ventricular tachycardia 07/15/2019  . Vitamin B12 deficiency     Past Surgical History:  Procedure Laterality Date  . BREAST EXCISIONAL BIOPSY Left 1995  . BREAST EXCISIONAL BIOPSY Right 1988  . CARDIOVERSION N/A 04/16/2018   Procedure: CARDIOVERSION;  Surgeon: Parke Poisson, MD;  Location: [facility] ENDOSCOPY;  Service: Cardiovascular;  Laterality: N/A;  . CATARACT EXTRACTION W/ INTRAOCULAR LENS  IMPLANT, BILATERAL  right 09/ 2013;  left 10/ 2013  . COLONOSCOPY  last one 04/10/ 2012  . CORONARY STENT INTERVENTION N/A 02/18/2018   Procedure: CORONARY STENT INTERVENTION;  Surgeon: Runell Gess, MD;  Location: [facility] INVASIVE CV LAB;  Service: Cardiovascular;  Laterality: N/A;  . D & C HYSTEROSCOPY W/ RESECTION ENDOMETRIAL POLYP AND LESION  07-27-2007   dr Ambrose Mantle @[facility]   . IR KYPHO LUMBAR INC FX REDUCE BONE BX UNI/BIL CANNULATION INC/IMAGING  07/11/2019  . LEFT HEART CATH AND CORONARY ANGIOGRAPHY N/A 02/18/2018   Procedure: LEFT HEART CATH AND CORONARY ANGIOGRAPHY;  Surgeon: 02/20/2018, MD;  Location: [facility] INVASIVE CV LAB;  Service: Cardiovascular;  Laterality: N/A;  . RECTOCELE REPAIR N/A 11/26/2017   Procedure: POSTERIOR REPAIR (RECTOCELE);   Surgeon: 11/28/2017, MD;  Location: [facility];  Service: Gynecology;  Laterality: N/A;  OUTPT IN BED  . TEMPORARY PACEMAKER N/A 02/18/2018   Procedure: TEMPORARY PACEMAKER;  Surgeon: 02/20/2018, MD;  Location[facility] INVASIVE CV LAB;  Service: Cardiovascular;  Laterality: N/A;  . TUBAL LIGATION Bilateral yrs ago    Current Medications: Current Meds  Medication Sig  . acetaminophen (TYLENOL) 325 MG tablet Take 650 mg by mouth every 6 (six) hours as needed.  [facility] apixaban (ELIQUIS) 5 MG TABS tablet Take 1 tablet (5 mg total) by mouth 2 (two) times daily.  Marland Kitchen atorvastatin (LIPITOR) 40 MG tablet Take 1 tablet (40 mg total) by mouth daily with supper.  . calcium citrate (CALCITRATE - DOSED IN  MG ELEMENTAL CALCIUM) 950 MG tablet Take 200 mg of elemental calcium by mouth daily.  . furosemide (LASIX) 20 MG tablet Take 0.5 tablets (10 mg total) by mouth in the morning.  . hydrALAZINE (APRESOLINE) 10 MG tablet Take 1 tablet (10 mg total) by mouth 3 (three) times daily as needed (significantly elevated Blood pressures).  . latanoprost (XALATAN) 0.005 % ophthalmic solution Place 1 drop into both eyes at bedtime.   Marland Kitchen loratadine (CLARITIN) 10 MG tablet Take 1 tablet (10 mg total) by mouth daily.  Marland Kitchen losartan (COZAAR) 100 MG tablet Take 100 mg by mouth daily.  . Multiple Vitamin (MULTIVITAMIN WITH MINERALS) TABS tablet Take 1 tablet by mouth daily.  . nitroGLYCERIN (NITROSTAT) 0.4 MG SL tablet Place 1 tablet (0.4 mg total) under the tongue every 5 (five) minutes as needed for chest pain.  Marland Kitchen propranolol (INDERAL) 10 MG tablet Take 1 tablet (10 mg total) by mouth 2 (two) times daily with a meal.  . QUEtiapine (SEROQUEL) 25 MG tablet Take 1 tablet (25 mg total) by mouth at bedtime.  . vitamin B-12 (CYANOCOBALAMIN) 1000 MCG tablet Take 1,000 mcg by mouth daily.  . Vitamin D, Ergocalciferol, (DRISDOL) 1.25 MG (50000 UNIT) CAPS capsule Take 50,000 Units by mouth every 7 (seven) days. On  Wednesday  . vitamin E 180 MG (400 UNITS) capsule Take 400 Units by mouth daily.  . [DISCONTINUED] feeding supplement, ENSURE ENLIVE, (ENSURE ENLIVE) LIQD Take 237 mLs by mouth 2 (two) times daily between meals.  . [DISCONTINUED] PSYLLIUM PO Take 1 capsule by mouth 2 (two) times daily.     Allergies:   Clindamycin/lincomycin and Nsaids   Social History   Socioeconomic History  . Marital status: Widowed    Spouse name: Not on file  . Number of children: 2  . Years of education: 85  . Highest education level: High school graduate  Occupational History  . Occupation: Retired  Tobacco Use  . Smoking status: Former Smoker    Quit date: 05/08/1961    Years since quitting: 59.1  . Smokeless tobacco: Never Used  Vaping Use  . Vaping Use: Never used  Substance and Sexual Activity  . Alcohol use: No  . Drug use: Never  . Sexual activity: Not on file  Other Topics Concern  . Not on file  Social History Narrative   12/07/19 lives alone   Social Determinants of Health   Financial Resource Strain: Not on file  Food Insecurity: Not on file  Transportation Needs: Not on file  Physical Activity: Not on file  Stress: Not on file  Social Connections: Not on file     Family History: The patient's family history includes Breast cancer in her daughter, maternal aunt, maternal grandmother, and sister; Heart disease in her father and sister; Pancreatic cancer in her sister.  ROS:   Please see the history of present illness.     All other systems reviewed and are negative.  EKGs/Labs/Other Studies Reviewed:    The following studies were reviewed today:   Recent Labs: 06/20/2019: Magnesium 2.0; TSH 1.610 06/21/2019: B Natriuretic Peptide 330.9 11/23/2019: ALT 19 03/18/2020: BUN 17; Creatinine, Ser 0.87; Hemoglobin 13.5; Platelets 157; Potassium 4.3; Sodium 140  Recent Lipid Panel    Component Value Date/Time   CHOL 187 10/22/2019 1624   CHOL 184 04/25/2019 1602   TRIG 63 10/22/2019  1624   HDL 83 10/22/2019 1624   HDL 86 04/25/2019 1602   CHOLHDL 2.3 10/22/2019 1624   VLDL  13 10/22/2019 1624   LDLCALC 91 10/22/2019 1624   LDLCALC 79 04/25/2019 1602    Physical Exam: Blood pressure 122/78, pulse 68, height 5\' 1"  (1.549 m), weight 152 lb 12.8 oz (69.3 kg), SpO2 97 %.  GEN:  Well nourished, well developed in no acute distress HEENT: Normal NECK: No JVD; No carotid bruits LYMPHATICS: No lymphadenopathy CARDIAC: RRR , no murmurs, rubs, gallops RESPIRATORY:  Clear to auscultation without rales, wheezing or rhonchi  ABDOMEN: Soft, non-tender, non-distended MUSCULOSKELETAL:  No edema; No deformity  SKIN: Warm and dry NEUROLOGIC:  Alert and oriented x 3    ECG:   ASSESSMENT:    1. Coronary artery disease involving native coronary artery of native heart without angina pectoris   2. Essential hypertension    PLAN:      1.  Ventricular tachycardia:     Stable, no further episodes     2.  Inferior wall myocardial infarction-     stable, no angina   3..  Acute congestive heart failure:     4..  Paroxysmal atrial fibrillation:  4.  DOE:  She had normal LV function in April 2021.   Encouraged her to do more exercise      6.  HTN:    BP is well controlled.    . Medication Adjustments/Labs and Tests Ordered: Current medicines are reviewed at length with the patient today.  Concerns regarding medicines are outlined above.  No orders of the defined types were placed in this encounter.  No orders of the defined types were placed in this encounter.   Patient Instructions  Medication Instructions:  Your physician recommends that you continue on your current medications as directed. Please refer to the Current Medication list given to you today.  *If you need a refill on your cardiac medications before your next appointment, please call your pharmacy*   Lab Work: none If you have labs (blood work) drawn today and your tests are completely normal, you  will receive your results only by: May 2021 MyChart Message (if you have MyChart) OR . A paper copy in the mail If you have any lab test that is abnormal or we need to change your treatment, we will call you to review the results.   Testing/Procedures: none   Follow-Up: At [facility], you and your health needs are our priority.  As part of our continuing mission to provide you with exceptional heart care, we have created designated Provider Care Teams.  These Care Teams include your primary Cardiologist (physician) and Advanced Practice Providers (APPs -  Physician Assistants and Nurse Practitioners) who all work together to provide you with the care you need, when you need it.   Your next appointment:   1 year(s)  The format for your next appointment:   In Person  Provider:   You may see [facility], MD or one of the following Advanced Practice Providers on your designated Care Team:    Kristeen Miss, PA-C  Charlene Dickerson, Chelsea Aus       Signed, [place], MD  06/11/2020 6:02 PM    [facility]

## 2020-06-11 ENCOUNTER — Ambulatory Visit (INDEPENDENT_AMBULATORY_CARE_PROVIDER_SITE_OTHER): Payer: Medicare Other | Admitting: Cardiovascular Disease

## 2020-06-11 ENCOUNTER — Other Ambulatory Visit: Payer: Self-pay

## 2020-06-11 ENCOUNTER — Encounter: Payer: Self-pay | Admitting: Cardiovascular Disease

## 2020-06-11 VITALS — BP 122/78 | HR 68 | Ht 61.0 in | Wt 152.8 lb

## 2020-06-11 DIAGNOSIS — I10 Essential (primary) hypertension: Secondary | ICD-10-CM

## 2020-06-11 DIAGNOSIS — I2510 Atherosclerotic heart disease of native coronary artery without angina pectoris: Secondary | ICD-10-CM | POA: Diagnosis not present

## 2020-06-11 NOTE — Patient Instructions (Signed)

## 2020-06-28 ENCOUNTER — Other Ambulatory Visit: Payer: Self-pay

## 2020-06-28 ENCOUNTER — Ambulatory Visit
Admission: RE | Admit: 2020-06-28 | Discharge: 2020-06-28 | Disposition: A | Discharge status: Home / Self Care | Payer: Medicare Other | Source: Ambulatory Visit | Attending: Internal Medicine | Admitting: Internal Medicine

## 2020-06-28 DIAGNOSIS — M810 Age-related osteoporosis without current pathological fracture: Secondary | ICD-10-CM

## 2020-07-24 NOTE — Progress Notes (Signed)
I reviewed note and agree with plan.   Banner Huckaba R. Grady Mohabir, MD 07/24/2020, 9:22 PM Certified in Neurology, Neurophysiology and Neuroimaging  [facility] [address], [address] [place], [place] [address] [phone]  

## 2020-12-19 ENCOUNTER — Other Ambulatory Visit: Payer: Self-pay | Admitting: Internal Medicine

## 2020-12-19 DIAGNOSIS — Z1231 Encounter for screening mammogram for malignant neoplasm of breast: Secondary | ICD-10-CM

## 2021-01-21 ENCOUNTER — Ambulatory Visit
Admission: RE | Admit: 2021-01-21 | Discharge: 2021-01-21 | Disposition: A | Discharge status: Home / Self Care | Payer: Medicare Other | Source: Ambulatory Visit | Attending: Internal Medicine | Admitting: Internal Medicine

## 2021-01-21 DIAGNOSIS — Z1231 Encounter for screening mammogram for malignant neoplasm of breast: Secondary | ICD-10-CM

## 2021-06-06 ENCOUNTER — Ambulatory Visit: Payer: Medicare Other | Admitting: Family Medicine

## 2021-06-13 ENCOUNTER — Ambulatory Visit: Payer: Medicare Other | Admitting: Family Medicine

## 2021-08-06 ENCOUNTER — Ambulatory Visit (INDEPENDENT_AMBULATORY_CARE_PROVIDER_SITE_OTHER): Payer: Medicare Other | Admitting: Cardiovascular Disease

## 2021-08-06 ENCOUNTER — Encounter: Payer: Self-pay | Admitting: Cardiovascular Disease

## 2021-08-06 VITALS — BP 142/80 | HR 69 | Ht 62.0 in | Wt 154.0 lb

## 2021-08-06 DIAGNOSIS — I2510 Atherosclerotic heart disease of native coronary artery without angina pectoris: Secondary | ICD-10-CM

## 2021-08-06 DIAGNOSIS — I10 Essential (primary) hypertension: Secondary | ICD-10-CM

## 2021-08-06 NOTE — Progress Notes (Signed)
Cardiology Office Note:    Date:  08/06/2021   ID:  Charlene Dickerson, DOB 19-May-1932, MRN [id number]  PCP:  Ginger Organ., MD  Cardiologist:  Mertie Moores, MD  Electrophysiologist:  None   Referring MD: Ginger Organ., MD   Chief Complaint  Patient presents with   Coronary Artery Disease   Atrial Fibrillation     Jan. 10, 2020   Charlene Dickerson is a 88 y.o. female with a recent admission to the hospital with an acute inferior wall myocardial infarction.  Was complicated by right ventricular infarction.  Was seen with Charlene Dickerson, ( daughter)   She initially had lots of problems with hypotension and required Levophed for several days while in hospital.  But has made a great recovery.   Also had paroxysmal atrial fibrillation as a complication of her heart attack.  She converted back to sinus rhythm on December 24    She is now feeling quite a bit better. No syncope or presyncope.  She is not had any episodes of angina.  April 12, 2018: See seen back today for follow-up visit.  She has a history of an inferior wall myocardial infarction complicated by right ventricular myocardial infarction.  She is developed atrial fibrillation. Gaining weight and her blood pressures been elevated.  We started her on furosemide 20 mg a day . Marland Kitchen  Swelling has improved significantly after starting Lasix but she still very uncomfortable. In discussing with her daughter, Charlene Dickerson , it is clear that [name] is not putting out much urine even on the Lasix.  Feb. 22, 2021  Charlene Dickerson was seen a year ago  She has a hx of Inf. MI with RV infarction.  made a nice recovery   She has RV CHF and atrial fib She had a successful cardioversion in Feb. 2020 She had a telemedicine visit with [name], [place] in August, 2020  Seemed to be doing well  She had some volume excess and her lasix was increased to 40 BID  Rare episodes of dyspnea with walking . No cp  Still has some leg swelling    Some leg  swelling .   Is on lasix 20 bid,  Has frequent urination   Has ad 1st covid vaccine.     Jul 15, 2019:  Charlene Dickerson is seen today for follow-up visit.  She was hospitalized in mid April with generalized weakness.  She had a UTI , developed acute renal failure , hyperkalemia and then ventricular tachycardia.   While in the emergency room she developed wide-complex tachycardia that is consistent with ventricular tachycardia.  Her potassium was 7.0 at the time.  She had acute renal failure with a creatinine of 5.8.  She is she was successfully cardioverted.  She was bagged briefly but did not lose a pulse.  No CP   She is developed some mild leg edema.  While she was in rehab she was started on Lasix 10 mg a day on Mondays Wednesdays and Fridays.  This seems to control her Lasix.  Her creatinine and potassium seem to be well controlled on this regimen. Echocardiogram from June 20, 2019 reveals normal left ventricular systolic function.  She has grade 1 diastolic dysfunction.  RV function is normal.  PA pressures are at the upper limits of normal.  She has moderate aortic insufficiency.  Oct.  5, 2021:  Seen with Daughter, Charlene Dickerson.  She was in the hosptial for HTN in Aug. 2021 Is on  Losartan 50 mg ( new medication )  Was also given hydralazine 10 mg to take as needed 3 times.  May be having TIAs recently .  MRI was unremarkable Carotid duplex is normal .  June 11, 2020: Charlene Dickerson is sen today for follow up of her HTN Mancy had covid in Dec  Still has some DOE I encouraged her to ambulate more .    August 06, 2021: Charlene Dickerson seen for follow-up of her hypertension.  She has a history of coronary artery disease and has had stenting to the mid/distal right coronary artery  Echocardiogram from April, 2021 shows normal left ventricular systolic function.  EF is 60 to 65%.  She has grade 1 diastolic dysfunction.  Occasionally eats more salt than she should  Lipids managed by Dr. Brigitte Pulse.   Past Medical  History:  Diagnosis Date   AKI (acute kidney injury) 06/20/2019   CAD (coronary artery disease) 06/20/2019   Chronic diastolic CHF (congestive heart failure) 06/20/2019   Closed compression fracture of L4 lumbar vertebra 06/20/2019   Complete heart block    Cystocele with rectocele 11/26/2017   Diffuse cystic mastopathy of breast 05/09/2011   Diverticulosis of colon    Essential hypertension 06/20/2019   Generalized anxiety disorder    GERD (gastroesophageal reflux disease)    occasional (no meds)   History of basal cell carcinoma excision 2012   History of delirium    Hyperkalemia 06/20/2019   Low back pain 06/01/2019   Malnutrition of moderate degree [id number]   Metabolic acidosis 0000000   Microscopic hematuria 01/23/2020   Mild neurocognitive disorder due to multiple etiologies 01/23/2020   Likely a combination of a vascular etiology and deficits related to prior delirium   Osteoporosis    Paroxysmal atrial fibrillation    PONV (postoperative nausea and vomiting)    Shock circulatory    STEMI (ST elevation myocardial infarction) 02/18/2018   Transient ischemic attack (TIA)    Uremia 06/20/2019   Ventricular tachycardia 07/15/2019   Vitamin B12 deficiency     Past Surgical History:  Procedure Laterality Date   BREAST EXCISIONAL BIOPSY Left 1995   BREAST EXCISIONAL BIOPSY Right 1988   CARDIOVERSION N/A 04/16/2018   Procedure: CARDIOVERSION;  Surgeon: Elouise Munroe, MD;  Location: [facility] END[facility]COPY;  Service: Cardiovascular;  Laterality: N/A;   CATARACT EXTRACTION W/ INTRAOCULAR LENS  IMPLANT, BILATERAL  right 09/ 2013;  left 10/ 2013   COLONOSCOPY  last one 04/10/ 2012   CORONARY STENT INTERVENTION N/A 02/18/2018   Procedure: CORONARY STENT INTERVENTION;  Surgeon: Lorretta Harp, MD;  Location: [facility];  Service: Cardiovascular;  Laterality: N/A;   D & C HYSTEROSCOPY W/ RESECTION ENDOMETRIAL POLYP AND LESION  07-27-2007   dr Ulanda Edison @[facility]    IR KYPHO LUMBAR INC FX  REDUCE BONE BX UNI/BIL CANNULATION INC/IMAGING  07/11/2019   LEFT HEART CATH AND CORONARY ANGIOGRAPHY N/A 02/18/2018   Procedure: LEFT HEART CATH AND CORONARY ANGIOGRAPHY;  Surgeon: Lorretta Harp, MD;  Location: [facility];  Service: Cardiovascular;  Laterality: N/A;   RECTOCELE REPAIR N/A 11/26/2017   Procedure: POSTERIOR REPAIR (RECTOCELE);  Surgeon: Newton Pigg, MD;  Location: [facility];  Service: Gynecology;  Laterality: N/A;  OUTPT IN BED   TEMPORARY PACEMAKER N/A 02/18/2018   Procedure: TEMPORARY PACEMAKER;  Surgeon: Lorretta Harp, MD;  Location: [facility];  Service: Cardiovascular;  Laterality: N/A;   TUBAL LIGATION Bilateral yrs ago    Current Medications:  Current Meds  Medication Sig   acetaminophen (TYLENOL) 325 MG tablet Take 650 mg by mouth every 6 (six) hours as needed (pain).   amLODipine (NORVASC) 2.5 MG tablet Take 2.5 mg by mouth daily.   apixaban (ELIQUIS) 5 MG TABS tablet Take 1 tablet (5 mg total) by mouth 2 (two) times daily.   atorvastatin (LIPITOR) 40 MG tablet Take 1 tablet (40 mg total) by mouth daily with supper.   calcium citrate (CALCITRATE - DOSED IN MG ELEMENTAL CALCIUM) 950 MG tablet Take 200 mg of elemental calcium by mouth daily.   furosemide (LASIX) 20 MG tablet Take 0.5 tablets (10 mg total) by mouth in the morning.   hydrALAZINE (APRESOLINE) 10 MG tablet Take 1 tablet (10 mg total) by mouth 3 (three) times daily as needed (significantly elevated Blood pressures).   latanoprost (XALATAN) 0.005 % ophthalmic solution Place 1 drop into both eyes at bedtime.    losartan (COZAAR) 100 MG tablet Take 100 mg by mouth daily.   Multiple Vitamin (MULTIVITAMIN WITH MINERALS) TABS tablet Take 1 tablet by mouth daily.   nitroGLYCERIN (NITROSTAT) 0.4 MG SL tablet Place 1 tablet (0.4 mg total) under the tongue every 5 (five) minutes as needed for chest pain.   propranolol (INDERAL) 10 MG tablet Take 1 tablet (10 mg total) by mouth  2 (two) times daily with a meal.   vitamin B-12 (CYANOCOBALAMIN) 1000 MCG tablet Take 1,000 mcg by mouth daily.   Vitamin D, Ergocalciferol, (DRISDOL) 1.25 MG (50000 UNIT) CAPS capsule Take 50,000 Units by mouth every 7 (seven) days. On Wednesday   vitamin E 180 MG (400 UNITS) capsule Take 400 Units by mouth daily.     Allergies:   Clindamycin/lincomycin and Nsaids   Social History   Socioeconomic History   Marital status: Widowed    Spouse name: Not on file   Number of children: 2   Years of education: 28   Highest education level: High school graduate  Occupational History   Occupation: Retired  Tobacco Use   Smoking status: Former    Types: Cigarettes    Quit date: 05/08/1961    Years since quitting: 60.2   Smokeless tobacco: Never  Vaping Use   Vaping Use: Never used  Substance and Sexual Activity   Alcohol use: No   Drug use: Never   Sexual activity: Not on file  Other Topics Concern   Not on file  Social History Narrative   12/07/19 lives alone   Social Determinants of Health   Financial Resource Strain: Not on file  Food Insecurity: Not on file  Transportation Needs: Not on file  Physical Activity: Not on file  Stress: Not on file  Social Connections: Not on file     Family History: The patient's family history includes Breast cancer in her daughter, maternal aunt, maternal grandmother, and sister; Heart disease in her father and sister; Pancreatic cancer in her sister.  ROS:   Please see the history of present illness.     All other systems reviewed and are negative.  EKGs/Labs/Other Studies Reviewed:    The following studies were reviewed today:   Recent Labs: No results found for requested labs within last 8760 hours.  Recent Lipid Panel    Component Value Date/Time   CHOL 187 10/22/2019 1624   CHOL 184 04/25/2019 1602   TRIG 63 10/22/2019 1624   HDL 83 10/22/2019 1624   HDL 86 04/25/2019 1602   CHOLHDL 2.3 10/22/2019 1624   VLDL  13  10/22/2019 1624   LDLCALC 91 10/22/2019 1624   LDLCALC 79 04/25/2019 1602    Physical Exam: Blood pressure (!) 142/80, pulse 69, height 5\' 2"  (1.575 m), weight 154 lb (69.9 kg), SpO2 95 %.  GEN:  Well nourished, well developed in no acute distress HEENT: Normal NECK: No JVD; No carotid bruits LYMPHATICS: No lymphadenopathy CARDIAC: RRR , no murmurs, rubs, gallops RESPIRATORY:  Clear to auscultation without rales, wheezing or rhonchi  ABDOMEN: Soft, non-tender, non-distended MUSCULOSKELETAL:  No edema; No deformity  SKIN: Warm and dry NEUROLOGIC:  Alert and oriented x 3   ECG: August 06, 2021: Normal sinus rhythm at 69.  Occasional premature ventricular contractions.  Poor R wave progression-possible previous anterior wall myocardial infarction.  Previous inferior wall myocardial infarction.  ASSESSMENT:    No diagnosis found.  PLAN:      1.  Ventricular tachycardia:     Stable, no further episodes     2.  Inferior wall myocardial infarction-   she is status post inferior wall myocardial infarction.  She does have mild to moderate disease in her LAD.  I suspect that her poor R wave progression is due to lead placement..  She has healed up nicely from her myocardial infarction.  Her LVEF is normal.  She is not having any chest pain or shortness of breath.    3..  Acute congestive heart failure:     4..  Paroxysmal atrial fibrillation:  4.  DOE:      6.  HTN:    Her daughter came with her today and brought her blood pressure log.  She has occasional readings that are a little high.  Occasional readings that are normal and then some readings that are low.  I suspect that she eats more salt than she should.  Turns out that she eats fried or sugars fairly regularly and admits that they are very salty.  Given her age of 72 and the likelihood that I will be able to change his eating habit, have asked her to cut her salt in other areas.  I have asked her to continue to walk and watch  her weight.  Continue current medications.   . Medication Adjustments/Labs and Tests Ordered: Current medicines are reviewed at length with the patient today.  Concerns regarding medicines are outlined above.  No orders of the defined types were placed in this encounter.  No orders of the defined types were placed in this encounter.   There are no Patient Instructions on file for this visit.   Signed, Mertie Moores, MD  08/06/2021 4:30 PM    [place]

## 2021-08-06 NOTE — Patient Instructions (Signed)
Medication Instructions:  Your physician recommends that you continue on your current medications as directed. Please refer to the Current Medication list given to you today.  *If you need a refill on your cardiac medications before your next appointment, please call your pharmacy*   Lab Work: NONE If you have labs (blood work) drawn today and your tests are completely normal, you will receive your results only by: MyChart Message (if you have MyChart) OR A paper copy in the mail If you have any lab test that is abnormal or we need to change your treatment, we will call you to review the results.   Testing/Procedures: NONE   Follow-Up: At [facility], you and your health needs are our priority.  As part of our continuing mission to provide you with exceptional heart care, we have created designated Provider Care Teams.  These Care Teams include your primary Cardiologist (physician) and Advanced Practice Providers (APPs -  Physician Assistants and Nurse Practitioners) who all work together to provide you with the care you need, when you need it.  Your next appointment:   1 year(s)  The format for your next appointment:   In Person  Provider:   Loma Newton, or [place] {  Important Information About Sugar

## 2021-09-09 ENCOUNTER — Ambulatory Visit (INDEPENDENT_AMBULATORY_CARE_PROVIDER_SITE_OTHER): Payer: Medicare Other | Admitting: Family Medicine

## 2021-09-09 ENCOUNTER — Encounter: Payer: Self-pay | Admitting: Family Medicine

## 2021-09-09 VITALS — BP 156/81 | HR 73 | Ht 62.0 in | Wt 155.0 lb

## 2021-09-09 DIAGNOSIS — F0670 Mild neurocognitive disorder due to known physiological condition without behavioral disturbance: Secondary | ICD-10-CM

## 2021-09-09 NOTE — Patient Instructions (Signed)
Below is our plan:  We will continue to monitor memory. I agree with Dr Clelia Croft. I feel weaning Seroquel could be beneficial. Try taking 1/2 tablet every other night for 2 weeks then 1/2 tablet every 3rd day for a week or so then stop. Make sure to get plenty of rest.   Please make sure you are staying well hydrated. I recommend 50-60 ounces daily. Well balanced diet and regular exercise encouraged. Consistent sleep schedule with 6-8 hours recommended.   Please continue follow up with care team as directed.   Follow up with me as needed   You may receive a survey regarding today's visit. I encourage you to leave honest feed back as I do use this information to improve patient care. Thank you for seeing me today!   Management of Memory Problems   There are some general things you can do to help manage your memory problems.  Your memory may not in fact recover, but by using techniques and strategies you will be able to manage your memory difficulties better.   1)  Establish a routine. Try to establish and then stick to a regular routine.  By doing this, you will get used to what to expect and you will reduce the need to rely on your memory.  Also, try to do things at the same time of day, such as taking your medication or checking your calendar first thing in the morning. Think about think that you can do as a part of a regular routine and make a list.  Then enter them into a daily planner to remind you.  This will help you establish a routine.   2)  Organize your environment. Organize your environment so that it is uncluttered.  Decrease visual stimulation.  Place everyday items such as keys or cell phone in the same place every day (ie.  Basket next to front door) Use post it notes with a brief message to yourself (ie. Turn off light, lock the door) Use labels to indicate where things go (ie. Which cupboards are for food, dishes, etc.) Keep a notepad and pen by the telephone to take messages    3)  Memory Aids A diary or journal/notebook/daily planner Making a list (shopping list, chore list, to do list that needs to be done) Using an alarm as a reminder (kitchen timer or cell phone alarm) Using cell phone to store information (Notes, Calendar, Reminders) Calendar/White board placed in a prominent position Post-it notes   In order for memory aids to be useful, you need to have good habits.  It's no good remembering to make a note in your journal if you don't remember to look in it.  Try setting aside a certain time of day to look in journal.   4)  Improving mood and managing fatigue. There may be other factors that contribute to memory difficulties.  Factors, such as anxiety, depression and tiredness can affect memory. Regular gentle exercise can help improve your mood and give you more energy. Simple relaxation techniques may help relieve symptoms of anxiety Try to get back to completing activities or hobbies you enjoyed doing in the past. Learn to pace yourself through activities to decrease fatigue. Find out about some local support groups where you can share experiences with others. Try and achieve 7-8 hours of sleep at night.

## 2021-09-09 NOTE — Progress Notes (Signed)
Chief Complaint  Patient presents with   Follow-up    Pt with daughter, rm 87. Pt states overall stable and concerns. They would like to discuss coming off the seroquel. It was started when she went into the hospital and she has done well since dc back at home. This was over year ago. The daughter feels that she is doing well and thinks that the worsening memory concerns was related stroke/heart attack and being in hospital.     HISTORY OF PRESENT ILLNESS:  09/09/21 ALL:  Charlene Dickerson returns for follow up for MCI. She presents with her daughter, [name], who aids in history. They both feel she is doing very well. She continues to live alone. Memory is intact. No obvious deficits. Occasional short term memory difficulty but seems to be doing very well. She is able to manage her home and performs ADLs independently. No further episodes of confusion, delusions, or hallucinations. PCP discussed discontinuation of Seroquel at last visit last week. She does have some difficulty sleeping at night from time to time but able to get plenty of rest throughout the day.   06/06/2020 ALL:  Charlene Dickerson is a 88 y.o. female here today for follow up for MCI. She reports that she is doing well. She feels that memory is good. She found benefit working with ST. Neurocog eval concerning for possible vascular etiology. She lives independently. She drives without difficulty. She manages medicaitons and finances without difficulty. She is not interested in starting any new medicaitons. She loves to walk.   HISTORY (copied from Dr Richrd Humbles previous note)  88 year old female with coronary artery disease, CHF, A. fib, presented to the hospital for word Hunt difficulties and confusion in August 2021.  Patient had been in fairly good health until about 88 years old.  Then she was having issues with blood pressure fluctuation, confusion and heart issues.  She had a heart attack in December 2019.  Since that time she has had  problems with sleep, personality, mood and memory.  Patient lives independently.   Patient presented to hospital due to fluctuating confusion and word finding difficulties.  She was taken to the hospital for evaluation and code stroke was activated.  Patient was admitted for further evaluation.   Stroke work-up was completed.  Possibility of underlying neurodegenerative process was raised.  Patient had low B12 level of 189 and was started on replacement.  Patient was having delirium and visual hallucinations treated with Seroquel.   Since that time symptoms have improved but continue to fluctuate.  She has issues with multitasking, short-term memory, problem-solving, attention and language.   REVIEW OF SYSTEMS: Out of a complete 14 system review of symptoms, the patient complains only of the following symptoms, low back pain, short term memory loss  and all other reviewed systems are negative.   ALLERGIES: Allergies  Allergen Reactions   Clindamycin/Lincomycin Diarrhea and Other (See Comments)    "c-diff"   Nsaids Other (See Comments)    Was told to not take these     HOME MEDICATIONS: Outpatient Medications Prior to Visit  Medication Sig Dispense Refill   acetaminophen (TYLENOL) 325 MG tablet Take 650 mg by mouth every 6 (six) hours as needed (pain).     amLODipine (NORVASC) 2.5 MG tablet Take 2.5 mg by mouth daily.     apixaban (ELIQUIS) 5 MG TABS tablet Take 1 tablet (5 mg total) by mouth 2 (two) times daily. 60 tablet 0   atorvastatin (LIPITOR) 40  MG tablet Take 1 tablet (40 mg total) by mouth daily with supper. 30 tablet 0   calcium citrate (CALCITRATE - DOSED IN MG ELEMENTAL CALCIUM) 950 MG tablet Take 200 mg of elemental calcium by mouth daily.     furosemide (LASIX) 20 MG tablet Take 0.5 tablets (10 mg total) by mouth in the morning. 15 tablet 0   hydrALAZINE (APRESOLINE) 10 MG tablet Take 1 tablet (10 mg total) by mouth 3 (three) times daily as needed (significantly elevated  Blood pressures). 21 tablet 0   latanoprost (XALATAN) 0.005 % ophthalmic solution Place 1 drop into both eyes at bedtime.      loratadine (CLARITIN) 10 MG tablet Take 1 tablet (10 mg total) by mouth daily. 30 tablet 0   losartan (COZAAR) 100 MG tablet Take 100 mg by mouth daily.     Multiple Vitamin (MULTIVITAMIN WITH MINERALS) TABS tablet Take 1 tablet by mouth daily.     nitroGLYCERIN (NITROSTAT) 0.4 MG SL tablet Place 1 tablet (0.4 mg total) under the tongue every 5 (five) minutes as needed for chest pain. 25 tablet 3   olmesartan (BENICAR) 40 MG tablet Take 40 mg by mouth at bedtime.     propranolol (INDERAL) 10 MG tablet Take 1 tablet (10 mg total) by mouth 2 (two) times daily with a meal. 60 tablet 0   QUEtiapine (SEROQUEL) 25 MG tablet Take 1 tablet (25 mg total) by mouth at bedtime. (Patient taking differently: Take 12.5 mg by mouth at bedtime.) 30 tablet 0   vitamin B-12 (CYANOCOBALAMIN) 1000 MCG tablet Take 1,000 mcg by mouth daily.     Vitamin D, Ergocalciferol, (DRISDOL) 1.25 MG (50000 UNIT) CAPS capsule Take 50,000 Units by mouth every 7 (seven) days. On Wednesday     vitamin E 180 MG (400 UNITS) capsule Take 400 Units by mouth daily.     No facility-administered medications prior to visit.     PAST MEDICAL HISTORY: Past Medical History:  Diagnosis Date   AKI (acute kidney injury) 06/20/2019   CAD (coronary artery disease) 06/20/2019   Chronic diastolic CHF (congestive heart failure) 06/20/2019   Closed compression fracture of L4 lumbar vertebra 06/20/2019   Complete heart block    Cystocele with rectocele 11/26/2017   Diffuse cystic mastopathy of breast 05/09/2011   Diverticulosis of colon    Essential hypertension 06/20/2019   Generalized anxiety disorder    GERD (gastroesophageal reflux disease)    occasional (no meds)   History of basal cell carcinoma excision 2012   History of delirium    Hyperkalemia 06/20/2019   Low back pain 06/01/2019   Malnutrition of moderate degree  [id number]   Metabolic acidosis 0000000   Microscopic hematuria 01/23/2020   Mild neurocognitive disorder due to multiple etiologies 01/23/2020   Likely a combination of a vascular etiology and deficits related to prior delirium   Osteoporosis    Paroxysmal atrial fibrillation    PONV (postoperative nausea and vomiting)    Shock circulatory    STEMI (ST elevation myocardial infarction) 02/18/2018   Transient ischemic attack (TIA)    Uremia 06/20/2019   Ventricular tachycardia 07/15/2019   Vitamin B12 deficiency      PAST SURGICAL HISTORY: Past Surgical History:  Procedure Laterality Date   BREAST EXCISIONAL BIOPSY Left 1995   BREAST EXCISIONAL BIOPSY Right 1988   CARDIOVERSION N/A 04/16/2018   Procedure: CARDIOVERSION;  Surgeon: Elouise Munroe, MD;  Location: [facility] ENDOSCOPY;  Service: Cardiovascular;  Laterality: N/A;   CATARACT EXTRACTION  W/ INTRAOCULAR LENS  IMPLANT, BILATERAL  right 09/ 2013;  left 10/ 2013   COLONOSCOPY  last one 04/10/ 2012   CORONARY STENT INTERVENTION N/A 02/18/2018   Procedure: CORONARY STENT INTERVENTION;  Surgeon: Lorretta Harp, MD;  Location: [place] [facility];  Service: Cardiovascular;  Laterality: N/A;   D & C HYSTEROSCOPY W/ RESECTION ENDOMETRIAL POLYP AND LESION  07-27-2007   dr Ulanda Edison @[facility]    IR KYPHO LUMBAR INC FX REDUCE BONE BX UNI/BIL CANNULATION INC/IMAGING  07/11/2019   LEFT HEART CATH AND CORONARY ANGIOGRAPHY N/A 02/18/2018   Procedure: LEFT HEART CATH AND CORONARY ANGIOGRAPHY;  Surgeon: Lorretta Harp, MD;  Location: [place] [facility];  Service: Cardiovascular;  Laterality: N/A;   RECTOCELE REPAIR N/A 11/26/2017   Procedure: POSTERIOR REPAIR (RECTOCELE);  Surgeon: Newton Pigg, MD;  Location[facility];  Service: Gynecology;  Laterality: N/A;  OUTPT IN BED   TEMPORARY PACEMAKER N/A 02/18/2018   Procedure: TEMPORARY PACEMAKER;  Surgeon: Lorretta Harp, MD;  Location: [place] [facility];  Service: Cardiovascular;   Laterality: N/A;   TUBAL LIGATION Bilateral yrs ago     FAMILY HISTORY: Family History  Problem Relation Age of Onset   Heart disease Sister    Breast cancer Sister    Heart disease Father    Breast cancer Daughter    Breast cancer Maternal Aunt    Breast cancer Maternal Grandmother    Pancreatic cancer Sister      SOCIAL HISTORY: Social History   Socioeconomic History   Marital status: Widowed    Spouse name: Not on file   Number of children: 2   Years of education: 70   Highest education level: High school graduate  Occupational History   Occupation: Retired  Tobacco Use   Smoking status: Former    Types: Cigarettes    Quit date: 05/08/1961    Years since quitting: 60.3   Smokeless tobacco: Never  Vaping Use   Vaping Use: Never used  Substance and Sexual Activity   Alcohol use: No   Drug use: Never   Sexual activity: Not on file  Other Topics Concern   Not on file  Social History Narrative   12/07/19 lives alone   Social Determinants of Health   Financial Resource Strain: Not on file  Food Insecurity: Not on file  Transportation Needs: Not on file  Physical Activity: Not on file  Stress: Not on file  Social Connections: Not on file  Intimate Partner Violence: Not on file      PHYSICAL EXAM  Vitals:   09/09/21 1422  BP: (!) 156/81  Pulse: 73  Weight: 155 lb (70.3 kg)  Height: 5\' 2"  (1.575 m)    Body mass index is 28.35 kg/m.   Generalized: Well developed, in no acute distress  Cardiology: normal rate and rhythm, no murmur auscultated  Respiratory: clear to auscultation bilaterally    Neurological examination  Mentation: Alert oriented to time, place, history taking. Follows all commands speech and language fluent Cranial nerve II-XII: Pupils were equal round reactive to light. Extraocular movements were full, visual field were full on confrontational test. Facial sensation and strength were normal. Head turning and shoulder shrug  were  normal and symmetric. Motor: The motor testing reveals 5 over 5 strength of all 4 extremities. Good symmetric motor tone is noted throughout.   Gait and station: Gait is arthritic, mildly stooped posture (reports back pain)    DIAGNOSTIC DATA (LABS, IMAGING, TESTING) -  I reviewed patient records, labs, notes, testing and imaging myself where available.  Lab Results  Component Value Date   WBC 4.5 03/18/2020   HGB 13.5 03/18/2020   HCT 40.5 03/18/2020   MCV 100.5 (H) 03/18/2020   PLT 157 03/18/2020      Component Value Date/Time   NA 140 03/18/2020 1148   NA 143 04/25/2019 1602   K 4.3 03/18/2020 1148   CL 106 03/18/2020 1148   CO2 27 03/18/2020 1148   GLUCOSE 91 03/18/2020 1148   BUN 17 03/18/2020 1148   BUN 13 04/25/2019 1602   CREATININE 0.87 03/18/2020 1148   CALCIUM 9.3 03/18/2020 1148   PROT 6.6 11/23/2019 1220   PROT 7.1 04/25/2019 1602   ALBUMIN 3.9 11/23/2019 1220   ALBUMIN 4.2 04/25/2019 1602   AST 20 11/23/2019 1220   ALT 19 11/23/2019 1220   ALKPHOS 46 11/23/2019 1220   BILITOT 1.0 11/23/2019 1220   BILITOT 0.5 04/25/2019 1602   GFRNONAA >60 03/18/2020 1148   GFRAA >60 11/23/2019 1220   Lab Results  Component Value Date   CHOL 187 10/22/2019   HDL 83 10/22/2019   LDLCALC 91 10/22/2019   TRIG 63 10/22/2019   CHOLHDL 2.3 10/22/2019   Lab Results  Component Value Date   HGBA1C 5.4 10/22/2019   Lab Results  Component Value Date   VITAMINB12 189 10/23/2019   Lab Results  Component Value Date   TSH 1.610 06/20/2019       09/09/2021    2:24 PM 12/07/2019   10:59 AM  MMSE - Mini Mental State Exam  Orientation to time 5 5  Orientation to Place 5 5  Registration 3 3  Attention/ Calculation 3 1  Recall 3 3  Language- name 2 objects 2 2  Language- repeat 1 1  Language- follow 3 step command 3 3  Language- read & follow direction 1 1  Write a sentence 1 1  Copy design 1 0  Total score 28 25         No data to display            ASSESSMENT AND PLAN  88 y.o. year old female  has a past medical history of AKI (acute kidney injury) (06/20/2019), CAD (coronary artery disease) (06/20/2019), Chronic diastolic CHF (congestive heart failure) (06/20/2019), Closed compression fracture of L4 lumbar vertebra (06/20/2019), Complete heart block, Cystocele with rectocele (11/26/2017), Diffuse cystic mastopathy of breast (05/09/2011), Diverticulosis of colon, Essential hypertension (06/20/2019), Generalized anxiety disorder, GERD (gastroesophageal reflux disease), History of basal cell carcinoma excision (2012), History of delirium, Hyperkalemia (06/20/2019), Low back pain (06/01/2019), Malnutrition of moderate degree (06/21/2019), Metabolic acidosis (06/20/2019), Microscopic hematuria (01/23/2020), Mild neurocognitive disorder due to multiple etiologies (01/23/2020), Osteoporosis, Paroxysmal atrial fibrillation, PONV (postoperative nausea and vomiting), Shock circulatory, STEMI (ST elevation myocardial infarction) (02/18/2018), Transient ischemic attack (TIA), Uremia (06/20/2019), Ventricular tachycardia (07/15/2019), and Vitamin B12 deficiency. here with   Mild neurocognitive disorder due to multiple etiologies  Rima is doing well. She feels that symptoms have improved with time and memory is improved. MMSE 28/30. We have discussed discontinuation of Seroquel. I advised weaning slowly. She was encouraged to follow up closely with PCP if insomnia worsens. She will return to see me as needed. Memory compensation strategies reviewed. She and her daughter verbalize understanding and and agreement with this plan.    No orders of the defined types were placed in this encounter.    No orders of the defined types were placed  in this encounter.    Debbora Presto, MSN, FNP-C 09/09/2021, 3:17 PM  [facility] [address], [place], [place] [address] [phone]

## 2021-10-23 ENCOUNTER — Other Ambulatory Visit (HOSPITAL_COMMUNITY): Payer: Self-pay | Admitting: *Deleted

## 2021-10-24 ENCOUNTER — Ambulatory Visit (HOSPITAL_COMMUNITY)
Admission: RE | Admit: 2021-10-24 | Discharge: 2021-10-24 | Disposition: A | Discharge status: Home / Self Care | Payer: Medicare Other | Source: Ambulatory Visit | Attending: Internal Medicine | Admitting: Internal Medicine

## 2021-10-24 DIAGNOSIS — M810 Age-related osteoporosis without current pathological fracture: Secondary | ICD-10-CM | POA: Diagnosis present

## 2021-10-24 MED ORDER — DENOSUMAB 60 MG/ML ~~LOC~~ SOSY
PREFILLED_SYRINGE | SUBCUTANEOUS | Status: AC
  Filled 2021-10-24: qty 1

## 2021-10-24 MED ORDER — DENOSUMAB 60 MG/ML ~~LOC~~ SOSY
60.0000 mg | PREFILLED_SYRINGE | Freq: Once | SUBCUTANEOUS | Status: AC
  Administered 2021-10-24: 60 mg via SUBCUTANEOUS

## 2021-12-06 IMAGING — MR MR LUMBAR SPINE W/O CM
4 of 6 series · 22 of 48 positions shown · non-contrast
Comparison: Lumbar spine radiograph 06/01/2019

CLINICAL DATA: Low back pain going to left groin

EXAM:
MRI LUMBAR SPINE WITHOUT CONTRAST
TECHNIQUE: Multiplanar, multisequence MR imaging of the lumbar spine was
performed. No intravenous contrast was administered.

[Series 5: T1 · sagittal · 4.0mm · 0.51mm/px · 4 of 15 slices shown]
[im 1/15]
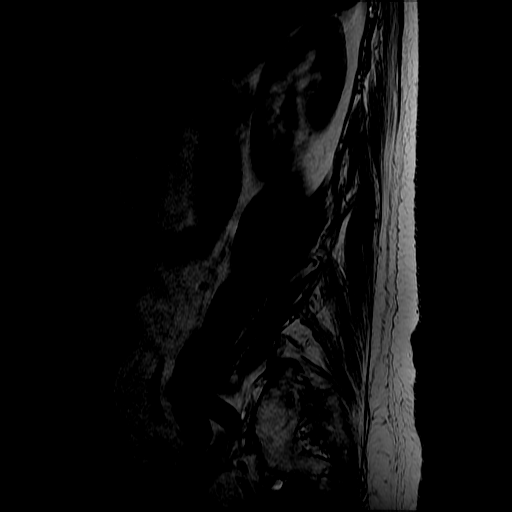
[im 4/15]
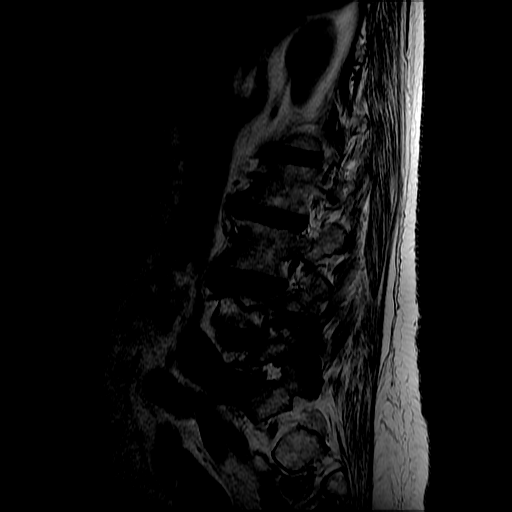
[im 8/15]
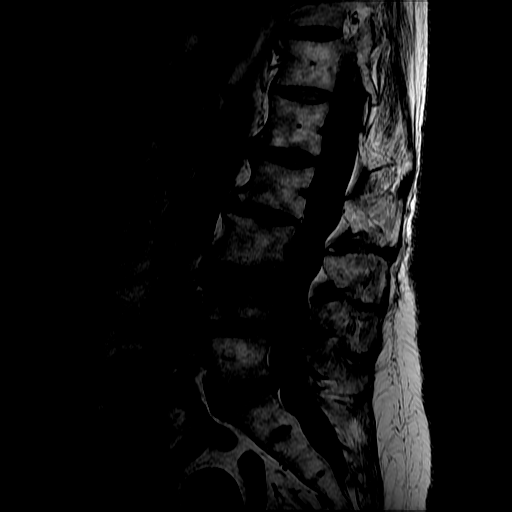
[im 15/15]
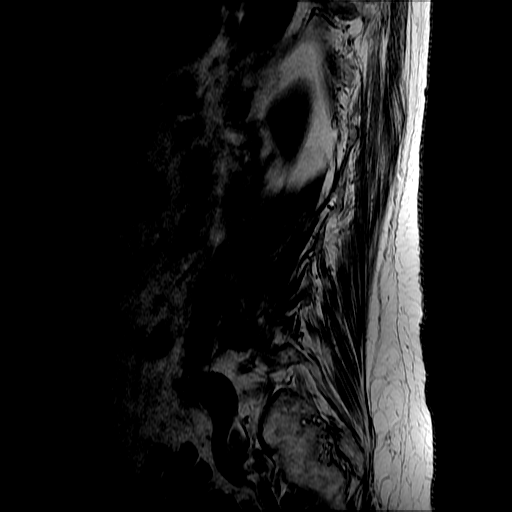

[Series 7: T2 · sagittal · 4.0mm · 0.51mm/px · 5 of 15 slices shown (1 of 3)]
[im 1/15]
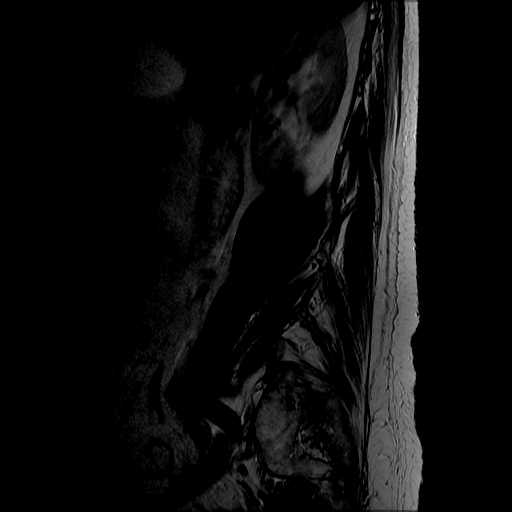
[im 4/15]
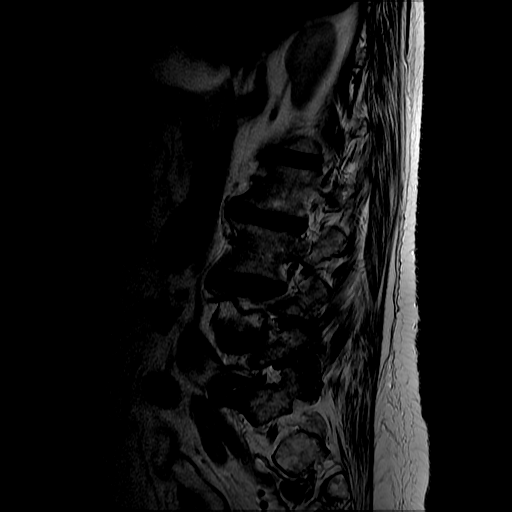
[im 8/15]
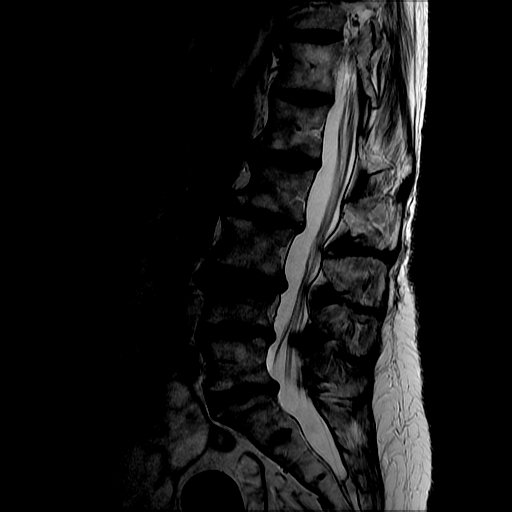
[im 11/15]
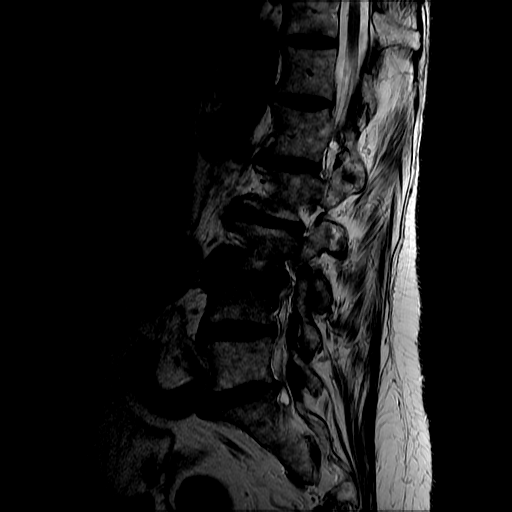
[im 15/15]
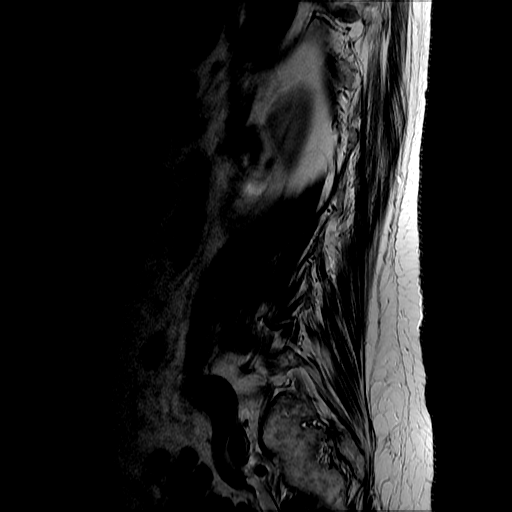

[Series 9: T2 · axial · 4.0mm · 0.39mm/px · z∈[-103,+126]mm · 8 of 38 slices shown (2 of 3)]
[im 1/38]
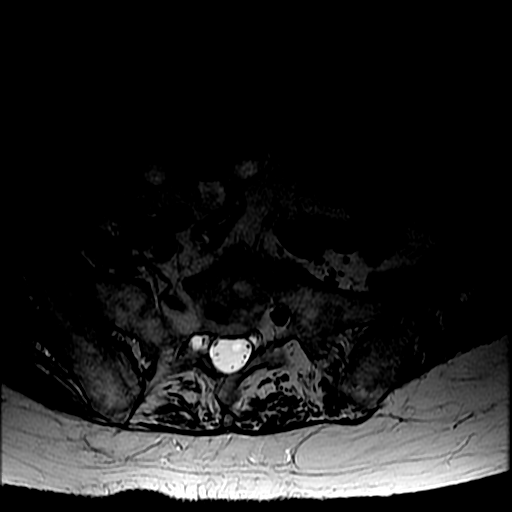
[im 6/38]
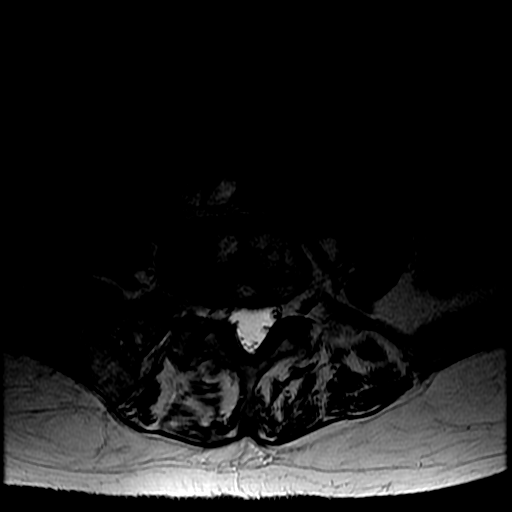
[im 12/38]
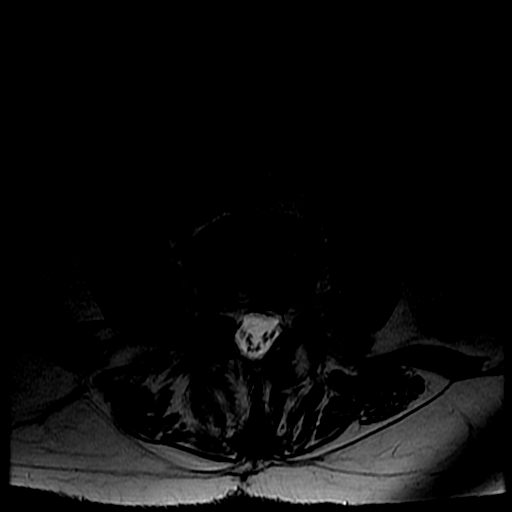
[im 18/38]
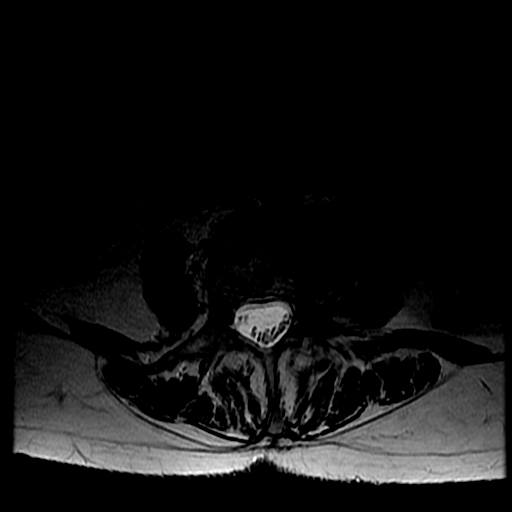
[im 20/38]
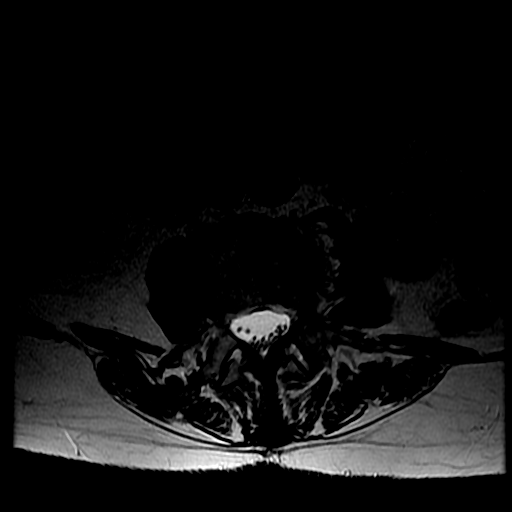
[im 26/38]
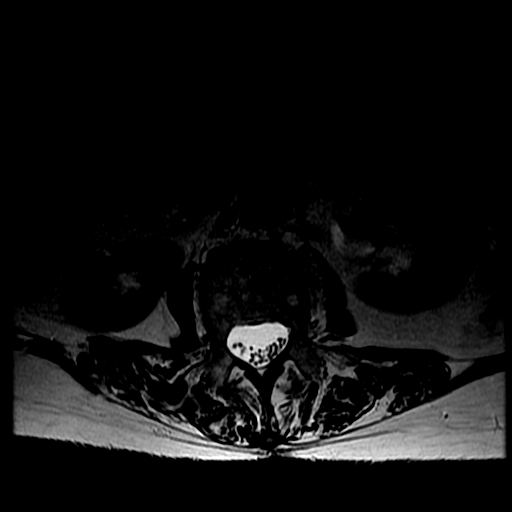
[im 32/38]
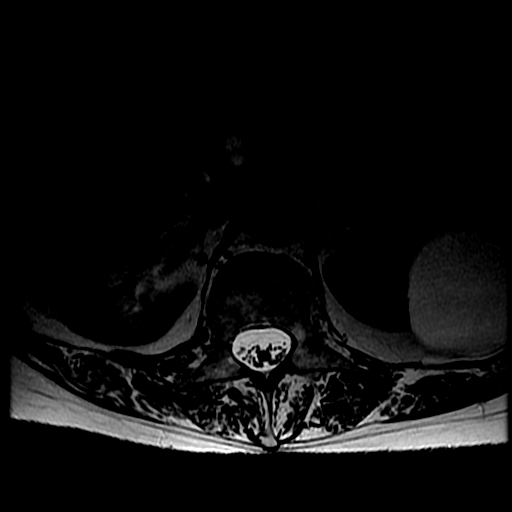
[im 38/38]
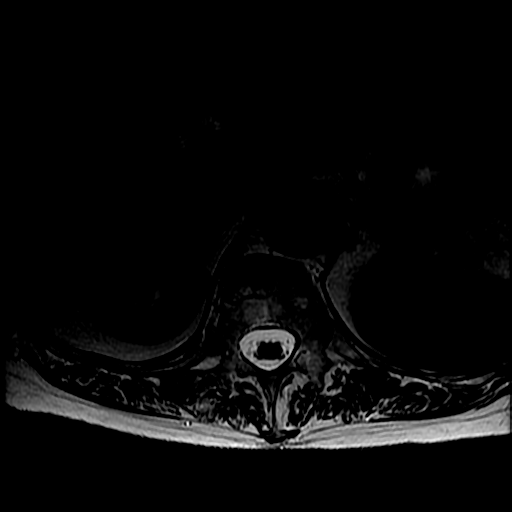

[Series 11: T2 · coronal · 4.0mm · 1.02mm/px · 5 of 13 slices shown (3 of 3)]
[im 1/13]
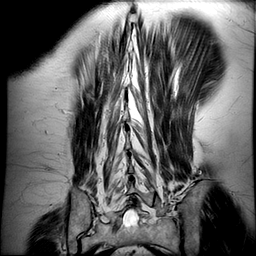
[im 4/13]
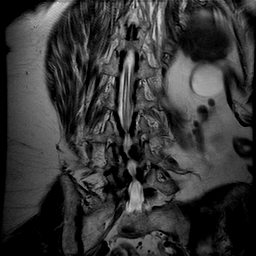
[im 7/13]
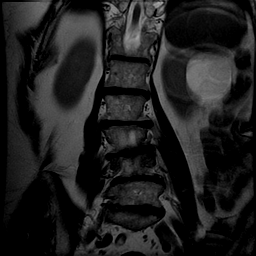
[im 10/13]
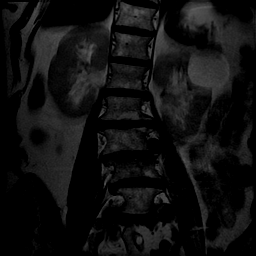
[im 13/13]
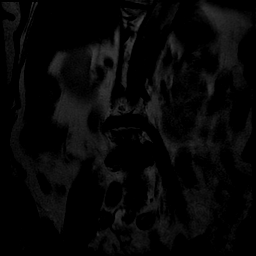

[22 of 48 positions shown; findings below may reference images not displayed]

FINDINGS: Segmentation:  Standard.

Alignment:  Trace retrolisthesis at L2-L3.

Vertebrae: Mild loss of height at the superior endplate of L4 appear
similar to 06/01/2019 radiograph. Marrow edema is present at L4.
There is degenerative endplate marrow edema at L2-L3, L3-L4, and
L5-S1. Minor loss of height at the superior endplate of L2 without
underlying marrow edema. There is no suspicious osseous lesion.

Conus medullaris and cauda equina: Conus extends to the L1-L2 level.
Conus and cauda equina appear normal.

Paraspinal and other soft tissues: Partially imaged T2 hyperintense
lesion of the left kidney statistically likely reflects a cyst.

Disc levels:

L1-L2:  Trace disc bulge.  No canal or foraminal stenosis.

L2-L3: Mild disc bulge slightly eccentric to the left and facet
arthropathy with left joint effusion. No canal stenosis. Partial
effacement of the left lateral recess. No foraminal stenosis.

L3-L4: Disc bulge and superimposed left foraminal protrusion with
endplate osteophytic ridging. Mild facet arthropathy with ligamentum
flavum infolding. No canal or right foraminal stenosis. Mild left
foraminal stenosis.

L4-L5: Disc bulge with endplate osteophytic ridging and moderate
facet arthropathy with ligamentum flavum infolding. Minor canal
stenosis with slight effacement of the lateral recesses. Mild right
foraminal stenosis. No left foraminal stenosis.

L5-S1: Disc bulge with endplate osteophytic ridging and mild facet
arthropathy. No canal or left foraminal stenosis. Minor right
foraminal stenosis. Far lateral component of disc bulge contacts the
extraforaminal right L5 nerve root.
IMPRESSION: Degenerative changes as detailed above. There is multilevel marrow
edema, potentially a source of pain. Superimposed recent L4
compression fracture is possible, noting vertebral body height is
similar to 06/01/2019 radiograph. There is no high-grade canal or
left foraminal stenosis. Narrowing of the left lateral recess at
L2-L3.

## 2021-12-23 ENCOUNTER — Other Ambulatory Visit: Payer: Self-pay | Admitting: Internal Medicine

## 2021-12-23 DIAGNOSIS — Z1231 Encounter for screening mammogram for malignant neoplasm of breast: Secondary | ICD-10-CM

## 2022-02-12 ENCOUNTER — Ambulatory Visit
Admission: RE | Admit: 2022-02-12 | Discharge: 2022-02-12 | Disposition: A | Discharge status: Home / Self Care | Payer: Medicare Other | Source: Ambulatory Visit | Attending: Internal Medicine | Admitting: Internal Medicine

## 2022-02-12 DIAGNOSIS — Z1231 Encounter for screening mammogram for malignant neoplasm of breast: Secondary | ICD-10-CM

## 2022-08-15 NOTE — Progress Notes (Unsigned)
Cardiology Office Note:    Date:  08/21/2022   ID:  Charlene Dickerson, DOB October 29, 1932, MRN [id number]  PCP:  Charlene Dickerson., MD   [facility] Providers Cardiologist:  Charlene Miss, MD     Referring MD: Charlene Dickerson., MD   Chief Complaint: follow-up CAD, hyperlipidemia  History of Present Illness:    Charlene Dickerson is a very pleasant 89 y.o. female with a hx of CAD s/p STEMI 01/2018, PAF on chronic anticoagulation, HLD, HTN, low vitamin B12 level, cognitive impairment, and chronic combined CHF.   Hospital admission 01/2018 with acute inferior wall myocardial infarction complicated by right ventricular infarction. She had stenting to the mid/distal RCA, mild to moderate LAD and LCx disease. She initially had lots of problems with hypotension and required Levophed for several days while hospitalized.  She had PAF and converted back to sinus rhythm on 02/23/2018. She made a remarkable recovery and has maintained consistent follow-up with Dr. Elease Dickerson.  She underwent successful cardioversion in February 2020.  She was hospitalized April 2021 with generalized weakness, found to have UTI and developed acute renal failure, hyperkalemia and then ventricular tachycardia.  Her potassium was 7.0 at the time.  She had AKI with a creatinine of 5.8. She did not lose pulse.  Echo 06/20/2019 revealed normal LVEF, G1 DD, RV function normal, PA pressures upper limits of normal, moderate aortic insufficiency.  Reported possible TIAs/cognitive impairment October 2021, MRI was unremarkable. Carotid duplex was normal.  Seen by Dr. Kirke Dickerson 08/2019 for lower extremity arterial study that showed occluded posterior tibial artery distally, however [name] was normal and there was no evidence of obstructive disease in the major vessels above the knee. Medical management recommended.   Last cardiology clinic visit was 08/06/2021 with Dr. Elease Dickerson. Overall doing well, admitted to occasionally eating more salt than she  should; she was encouraged to cut back. No changes were made to her treatment regimen and 1 year follow-up was recommended.  Today, she is here for follow-up and is accompanied by her daughter Charlene Dickerson.  Reports that she is doing well.  Has shortness of breath that has been present since MI in 2019. Admits that she stopped being as active following MI due to fear. She stay active at home and walks around inside occasionally outside as well but no formal exercise.  Is somewhat limited by chronic back pain secondary to back fractures and scoliosis. No chest pain, shortness of breath, orthopnea, edema, PND, lightheadedenss, presyncope, syncope. Home SBP typically 116-120s in the afternoons, a little higher up to 135 mmHg in the mornings. No concerns with current medications. PCP checking labs in 1 month. Is excited about her upcoming 89th birthday.   Past Medical History:  Diagnosis Date   AKI (acute kidney injury) 06/20/2019   CAD (coronary artery disease) 06/20/2019   Chronic diastolic CHF (congestive heart failure) 06/20/2019   Closed compression fracture of L4 lumbar vertebra 06/20/2019   Complete heart block    Cystocele with rectocele 11/26/2017   Diffuse cystic mastopathy of breast 05/09/2011   Diverticulosis of colon    Essential hypertension 06/20/2019   Generalized anxiety disorder    GERD (gastroesophageal reflux disease)    occasional (no meds)   History of basal cell carcinoma excision 2012   History of delirium    Hyperkalemia 06/20/2019   Low back pain 06/01/2019   Malnutrition of moderate degree 06/21/2019   Metabolic acidosis 06/20/2019   Microscopic hematuria 01/23/2020   Mild  neurocognitive disorder due to multiple etiologies 01/23/2020   Likely a combination of a vascular etiology and deficits related to prior delirium   Osteoporosis    Paroxysmal atrial fibrillation    PONV (postoperative nausea and vomiting)    Shock circulatory    STEMI (ST elevation myocardial infarction)  02/18/2018   Transient ischemic attack (TIA)    Uremia 06/20/2019   Ventricular tachycardia 07/15/2019   Vitamin B12 deficiency     Past Surgical History:  Procedure Laterality Date   BREAST EXCISIONAL BIOPSY Left 1995   BREAST EXCISIONAL BIOPSY Right 1988   CARDIOVERSION N/A 04/16/2018   Procedure: CARDIOVERSION;  Surgeon: Parke Poisson, MD;  Location: [facility] ENDOSCOPY;  Service: Cardiovascular;  Laterality: N/A;   CATARACT EXTRACTION W/ INTRAOCULAR LENS  IMPLANT, BILATERAL  right 09/ 2013;  left 10/ 2013   COLONOSCOPY  last one 04/10/ 2012   CORONARY STENT INTERVENTION N/A 02/18/2018   Procedure: CORONARY STENT INTERVENTION;  Surgeon: Runell Gess, MD;  Location: [facility] INVASIVE CV LAB;  Service: Cardiovascular;  Laterality: N/A;   D & C HYSTEROSCOPY W/ RESECTION ENDOMETRIAL POLYP AND LESION  07-27-2007   dr Ambrose Mantle @[facility]    IR KYPHO LUMBAR INC FX REDUCE BONE BX UNI/BIL CANNULATION INC/IMAGING  07/11/2019   LEFT HEART CATH AND CORONARY ANGIOGRAPHY N/A 02/18/2018   Procedure: LEFT HEART CATH AND CORONARY ANGIOGRAPHY;  Surgeon: Runell Gess, MD;  Location: [facility] INVASIVE CV LAB;  Service: Cardiovascular;  Laterality: N/A;   RECTOCELE REPAIR N/A 11/26/2017   Procedure: POSTERIOR REPAIR (RECTOCELE);  Surgeon: Tracey Harries, MD;  Location[facility];  Service: Gynecology;  Laterality: N/A;  OUTPT IN BED   TEMPORARY PACEMAKER N/A 02/18/2018   Procedure: TEMPORARY PACEMAKER;  Surgeon: Runell Gess, MD;  Location: [facility] INVASIVE CV LAB;  Service: Cardiovascular;  Laterality: N/A;   TUBAL LIGATION Bilateral yrs ago    Current Medications: Current Meds  Medication Sig   acetaminophen (TYLENOL) 325 MG tablet Take 650 mg by mouth every 6 (six) hours as needed (pain).   amLODipine (NORVASC) 2.5 MG tablet Take 2.5 mg by mouth daily.   apixaban (ELIQUIS) 5 MG TABS tablet Take 1 tablet (5 mg total) by mouth 2 (two) times daily.   atorvastatin (LIPITOR) 40 MG tablet Take 1 tablet  (40 mg total) by mouth daily with supper.   calcium citrate (CALCITRATE - DOSED IN MG ELEMENTAL CALCIUM) 950 MG tablet Take 200 mg of elemental calcium by mouth daily.   furosemide (LASIX) 20 MG tablet Take 0.5 tablets (10 mg total) by mouth in the morning.   hydrALAZINE (APRESOLINE) 10 MG tablet Take 1 tablet (10 mg total) by mouth 3 (three) times daily as needed (significantly elevated Blood pressures).   latanoprost (XALATAN) 0.005 % ophthalmic solution Place 1 drop into both eyes at bedtime.    moxifloxacin (VIGAMOX) 0.5 % ophthalmic solution    Multiple Vitamin (MULTIVITAMIN WITH MINERALS) TABS tablet Take 1 tablet by mouth daily.   olmesartan (BENICAR) 40 MG tablet Take 40 mg by mouth at bedtime.   prednisoLONE acetate (PRED FORTE) 1 % ophthalmic suspension    vitamin B-12 (CYANOCOBALAMIN) 1000 MCG tablet Take 1,000 mcg by mouth daily.   Vitamin D, Ergocalciferol, (DRISDOL) 1.25 MG (50000 UNIT) CAPS capsule Take 50,000 Units by mouth every 7 (seven) days. On Wednesday   vitamin E 180 MG (400 UNITS) capsule Take 400 Units by mouth daily.   [DISCONTINUED] losartan (COZAAR) 100 MG tablet Take 100 mg by mouth  daily.     Allergies:   Clindamycin/lincomycin and Nsaids   Social History   Socioeconomic History   Marital status: Widowed    Spouse name: Not on file   Number of children: 2   Years of education: 46   Highest education level: High school graduate  Occupational History   Occupation: Retired  Tobacco Use   Smoking status: Former    Types: Cigarettes    Quit date: 05/08/1961    Years since quitting: 61.3   Smokeless tobacco: Never  Vaping Use   Vaping Use: Never used  Substance and Sexual Activity   Alcohol use: No   Drug use: Never   Sexual activity: Not on file  Other Topics Concern   Not on file  Social History Narrative   12/07/19 lives alone   Social Determinants of Health   Financial Resource Strain: Not on file  Food Insecurity: Not on file  Transportation  Needs: Not on file  Physical Activity: Not on file  Stress: Not on file  Social Connections: Not on file     Family History: The patient's family history includes Breast cancer in her daughter, maternal aunt, maternal grandmother, and sister; Heart disease in her father and sister; Pancreatic cancer in her sister.  ROS:   Please see the history of present illness.    + chronic shortness of breath All other systems reviewed and are negative.  Labs/Other Studies Reviewed:    The following studies were reviewed today:  Echo Complete 06/20/2019 1. Left ventricular ejection fraction, by estimation, is 60 to 65%. The  left ventricle has normal function. The left ventricle has no regional  wall motion abnormalities. Left ventricular diastolic parameters are  consistent with Grade I diastolic  dysfunction (impaired relaxation).   2. Right ventricular systolic function is normal. The right ventricular  size is normal. There is mildly elevated pulmonary artery systolic  pressure. The estimated right ventricular systolic pressure is 32.4 mmHg.   3. The mitral valve is normal in structure. No evidence of mitral valve  regurgitation. No evidence of mitral stenosis.   4. Tricuspid valve regurgitation is moderate.   5. The aortic valve is normal in structure. Aortic valve regurgitation is  moderate. No aortic stenosis is present.   6. The inferior vena cava is normal in size with greater than 50%  respiratory variability, suggesting right atrial pressure of 3 mmHg.   LHC 02/18/2018 Prox Cx to Mid Cx lesion is 80% stenosed. Prox LAD to Mid LAD lesion is 50% stenosed. Prox RCA lesion is 99% stenosed. Mid RCA to Dist RCA lesion is 100% stenosed. A drug-eluting stent was successfully placed. Post intervention, there is a 0% residual stenosis. A stent was successfully placed. Post intervention, there is a 0% residual stenosis.  IMPRESSION: Ms. Lybarger had an acute inferior STEMI complicated  by complete heart block and cardiogenic shock.  Her heart rate improved with placement of a temporary transvenous pacemaker.  Her RCA intervention was difficult because of the proximal lesion which was highly calcified and resistant to balloon dilatation as well as extensive thrombus throughout the remainder of the vessel.  Ultimately I was able to restore antegrade flow with placement of 3 stents and aggressive angioplasty.  She was placed on Aggrastat drip.  The Angiomax was discontinued at the end of the case.  A 2D echo will be obtained.   Recent Labs: No results found for requested labs within last 365 days.  Recent Lipid Panel  Component Value Date/Time   CHOL 187 10/22/2019 1624   CHOL 184 04/25/2019 1602   TRIG 63 10/22/2019 1624   HDL 83 10/22/2019 1624   HDL 86 04/25/2019 1602   CHOLHDL 2.3 10/22/2019 1624   VLDL 13 10/22/2019 1624   LDLCALC 91 10/22/2019 1624   LDLCALC 79 04/25/2019 1602     Risk Assessment/Calculations:    CHA2DS2-VASc Score = 6   This indicates a 9.7% annual risk of stroke. The patient's score is based upon: CHF History: 1 HTN History: 1 Diabetes History: 0 Stroke History: 0 Vascular Disease History: 1 Age Score: 2 Gender Score: 1          Physical Exam:    VS:  BP (!) 150/80   Pulse 62   Ht 5\' 2"  (1.575 m)   Wt 151 lb (68.5 kg)   SpO2 96%   BMI 27.62 kg/m     Wt Readings from Last 3 Encounters:  08/21/22 151 lb (68.5 kg)  09/09/21 155 lb (70.3 kg)  08/06/21 154 lb (69.9 kg)     GEN:  Well nourished, well developed in no acute distress HEENT: Normal NECK: No JVD; No carotid bruits CARDIAC: RRR, no murmurs, rubs, gallops RESPIRATORY:  Clear to auscultation without rales, wheezing or rhonchi  ABDOMEN: Soft, non-tender, non-distended MUSCULOSKELETAL:  No edema; No deformity. 2+ pedal pulses, equal bilaterally SKIN: Warm and dry NEUROLOGIC:  Alert and oriented x 3 PSYCHIATRIC:  Normal affect   EKG:   EKG  Interpretation  Date/Time:  Thursday August 21 2022 10:48:44 EDT Ventricular Rate:  72 PR Interval:  192 QRS Duration: 82 QT Interval:  394 QTC Calculation: 431 R Axis:   122 Text Interpretation: Sinus rhythm with occasional and consecutive Premature ventricular complexes Left posterior fascicular block Septal infarct , age undetermined When compared with ECG of 18-Mar-2020 10:46, PREVIOUS ECG IS PRESENT Confirmed by Eligha Bridegroom [phone]) on 08/21/2022 4:26:13 PM    HYPERTENSION CONTROL Vitals:   08/21/22 1045 08/21/22 1109  BP: (!) 168/88 (!) 150/80    The patient's blood pressure is elevated above target today.  In order to address the patient's elevated BP: Blood pressure will be monitored at home to determine if medication changes need to be made.; The blood pressure is usually elevated in clinic.  Blood pressures monitored at home have been optimal.       Diagnoses:    1. Coronary artery disease involving native coronary artery of native heart without angina pectoris   2. Essential hypertension   3. Paroxysmal atrial fibrillation   4. Chronic anticoagulation   5. Ischemic cardiomyopathy   6. Nonrheumatic tricuspid valve regurgitation   7. Nonrheumatic aortic valve insufficiency   8. Dyslipidemia    Assessment and Plan:     CAD without angina:  History of acute inferior STEMI with RV failure and DES x 3 to prox and mid RCA. Residual calcified lesion to LAD and prox to mid LCx 80% stenosis. She reports chronic shortness of breath since STEMI but no dyspnea on exertion. She denies chest pain. No change in activity tolerance recently. No indication for further ischemic evaluation at this time. Encouraged increased activity and recommended possible recumbent bicycle or water aerobics.  LDL above goal as discussed below.  She will follow-up with PCP soon.  Continue amlodipine, atorvastatin, losartan,  Hypertension: BP is elevated today and remains so on my recheck. Home BP is  well-controlled. We have both losartan and olmesartan on med list. Have asked them  to check at home to clarify. She called back to report pt takes olmesartan only, no longer takes losartan.   PAF on chronic anticoagulation: She is in sinus rhythm today with well controlled ventricular rate.  No concerning symptoms of tachycardia or palpitations.  No bleeding concerns.  Continue Eliquis for stroke prevention for CHA2DS2-VASc score of 6. Continue propranolol for rate control.  Chronic combined CHF: LVEF 60-65%, G1DD, no rwma, mildly elevated PASP on echo 06/2019.  She has chronic shortness of breath for many years, no recent change in symptoms or in activity intolerance. No DOE, edema, orthopnea, or PND. She appears euvolemic on exam. Weight is stable. Limit dietary sodium.   Valve disease: Moderate TR and moderate AI on echo 06/2019. We discussed potential symptoms of worsening valve disease and well as potential treatments for severe disease. She is asymptomatic. We will continue to monitor clinically for now. Patient and daughter aware to notify us if she develops concerning symptoms prior to next office visit.   Hyperlipidemia LDL goal < 55: LDL 84 on 08/22/2021. She is having fasting lab work in 1 month. Lengthy discussion about LDL goal 55 or lower and the 5 year benefit of increased statin lowering. Would favor addition of ezetimibe or PCSK9i if LDL remains above goal. She and daughter will discuss with PCP and contact us if necessary.      Disposition: 6 months with me  Medication Adjustments/Labs and Tests Ordered: Current medicines are reviewed at length with the patient today.  Concerns regarding medicines are outlined above.  Orders Placed This Encounter  Procedures   EKG 12-Lead   No orders of the defined types were placed in this encounter.   Patient Instructions  Medication Instructions:   If LDL is greater than 55 you can try zetia one (1) tablet by mouth ( 10 mg) daily for a  15-20 % reduction or PCSK9 inhibitor for 40 % or greater reduction.   *If you need a refill on your cardiac medications before your next appointment, please call your pharmacy*   Lab Work:  None ordered.  If you have labs (blood work) drawn today and your tests are completely normal, you will receive your results only by: MyChart Message (if you have MyChart) OR A paper copy in the mail If you have any lab test that is abnormal or we need to change your treatment, we will call you to review the results.   Testing/Procedures:  None ordered.   Follow-Up: At [facility], you and your health needs are our priority.  As part of our continuing mission to provide you with exceptional heart care, we have created designated Provider Care Teams.  These Care Teams include your primary Cardiologist (physician) and Advanced Practice Providers (APPs -  Physician Assistants and Nurse Practitioners) who all work together to provide you with the care you need, when you need it.  We recommend signing up for the patient portal called "MyChart".  Sign up information is provided on this After Visit Summary.  MyChart is used to connect with patients for Virtual Visits (Telemedicine).  Patients are able to view lab/test results, encounter notes, upcoming appointments, etc.  Non-urgent messages can be sent to your provider as well.   To learn more about what you can do with MyChart, go to [email or web address].    Your next appointment:   5 month(s)  Provider:   Eligha Bridegroom, NP            Signed, Eligha Bridegroom  M, NP  08/21/2022 5:41 PM    [facility]

## 2022-08-21 ENCOUNTER — Ambulatory Visit: Payer: Medicare Other | Attending: Nurse Practitioner | Admitting: Nurse Practitioner

## 2022-08-21 ENCOUNTER — Encounter: Payer: Self-pay | Admitting: Nurse Practitioner

## 2022-08-21 VITALS — BP 150/80 | HR 62 | Ht 62.0 in | Wt 151.0 lb

## 2022-08-21 DIAGNOSIS — I351 Nonrheumatic aortic (valve) insufficiency: Secondary | ICD-10-CM

## 2022-08-21 DIAGNOSIS — I480 Paroxysmal atrial fibrillation: Secondary | ICD-10-CM | POA: Diagnosis not present

## 2022-08-21 DIAGNOSIS — Z7901 Long term (current) use of anticoagulants: Secondary | ICD-10-CM | POA: Diagnosis not present

## 2022-08-21 DIAGNOSIS — I255 Ischemic cardiomyopathy: Secondary | ICD-10-CM

## 2022-08-21 DIAGNOSIS — I10 Essential (primary) hypertension: Secondary | ICD-10-CM

## 2022-08-21 DIAGNOSIS — I2510 Atherosclerotic heart disease of native coronary artery without angina pectoris: Secondary | ICD-10-CM

## 2022-08-21 DIAGNOSIS — E785 Hyperlipidemia, unspecified: Secondary | ICD-10-CM

## 2022-08-21 DIAGNOSIS — I361 Nonrheumatic tricuspid (valve) insufficiency: Secondary | ICD-10-CM

## 2022-08-21 NOTE — Patient Instructions (Signed)
Medication Instructions:   If LDL is greater than 55 you can try zetia one (1) tablet by mouth ( 10 mg) daily for a 15-20 % reduction or PCSK9 inhibitor for 40 % or greater reduction.   *If you need a refill on your cardiac medications before your next appointment, please call your pharmacy*   Lab Work:  None ordered.  If you have labs (blood work) drawn today and your tests are completely normal, you will receive your results only by: MyChart Message (if you have MyChart) OR A paper copy in the mail If you have any lab test that is abnormal or we need to change your treatment, we will call you to review the results.   Testing/Procedures:  None ordered.   Follow-Up: At [facility], you and your health needs are our priority.  As part of our continuing mission to provide you with exceptional heart care, we have created designated Provider Care Teams.  These Care Teams include your primary Cardiologist (physician) and Advanced Practice Providers (APPs -  Physician Assistants and Nurse Practitioners) who all work together to provide you with the care you need, when you need it.  We recommend signing up for the patient portal called "MyChart".  Sign up information is provided on this After Visit Summary.  MyChart is used to connect with patients for Virtual Visits (Telemedicine).  Patients are able to view lab/test results, encounter notes, upcoming appointments, etc.  Non-urgent messages can be sent to your provider as well.   To learn more about what you can do with MyChart, go to [email or web address].    Your next appointment:   5 month(s)  Provider:   Eligha Bridegroom, NP

## 2022-08-21 NOTE — Addendum Note (Signed)
Addended by: Levi Aland on: 08/21/2022 05:42 PM   Modules accepted: Orders

## 2022-10-11 ENCOUNTER — Emergency Department (HOSPITAL_BASED_OUTPATIENT_CLINIC_OR_DEPARTMENT_OTHER): Payer: Medicare Other

## 2022-10-11 ENCOUNTER — Emergency Department (HOSPITAL_BASED_OUTPATIENT_CLINIC_OR_DEPARTMENT_OTHER)
Admission: EM | Admit: 2022-10-11 | Discharge: 2022-10-11 | Disposition: A | Discharge status: Home / Self Care | Payer: Medicare Other | Source: Home / Self Care | Attending: Emergency Medicine | Admitting: Emergency Medicine

## 2022-10-11 ENCOUNTER — Encounter (HOSPITAL_BASED_OUTPATIENT_CLINIC_OR_DEPARTMENT_OTHER): Payer: Self-pay | Admitting: *Deleted

## 2022-10-11 ENCOUNTER — Other Ambulatory Visit: Payer: Self-pay

## 2022-10-11 DIAGNOSIS — R21 Rash and other nonspecific skin eruption: Secondary | ICD-10-CM | POA: Insufficient documentation

## 2022-10-11 DIAGNOSIS — W01198A Fall on same level from slipping, tripping and stumbling with subsequent striking against other object, initial encounter: Secondary | ICD-10-CM | POA: Diagnosis not present

## 2022-10-11 DIAGNOSIS — Z7901 Long term (current) use of anticoagulants: Secondary | ICD-10-CM | POA: Diagnosis not present

## 2022-10-11 DIAGNOSIS — Y92000 Kitchen of unspecified non-institutional (private) residence as  the place of occurrence of the external cause: Secondary | ICD-10-CM | POA: Diagnosis not present

## 2022-10-11 DIAGNOSIS — S60211A Contusion of right wrist, initial encounter: Secondary | ICD-10-CM | POA: Diagnosis not present

## 2022-10-11 DIAGNOSIS — I6789 Other cerebrovascular disease: Secondary | ICD-10-CM | POA: Diagnosis not present

## 2022-10-11 DIAGNOSIS — S6991XA Unspecified injury of right wrist, hand and finger(s), initial encounter: Secondary | ICD-10-CM | POA: Diagnosis present

## 2022-10-11 DIAGNOSIS — S0990XA Unspecified injury of head, initial encounter: Secondary | ICD-10-CM | POA: Insufficient documentation

## 2022-10-11 DIAGNOSIS — W19XXXA Unspecified fall, initial encounter: Secondary | ICD-10-CM

## 2022-10-11 NOTE — ED Provider Notes (Signed)
[place] EMERGENCY DEPARTMENT AT [facility] Provider Note   CSN: [id number] Arrival date & time: 10/11/22  2952     History  Chief Complaint  Patient presents with   Charlene Dickerson    BRINLYNN Dickerson is a 89 y.o. female.  HPI    89 year old female comes in with chief complaint of mechanical fall. Patient takes blood thinners.  She slipped in her kitchen and fell yesterday.  She struck her head in the process.  She is coming into the ER from urgent care because of wrist pain.  Patient had gone to the urgent care, however she complained of direct blunt trauma to her head and she is on blood thinners, therefore she was advised to come to the emergency room.  Pt has no associated nausea, vomiting, seizures, loss of consciousness or new visual complains, weakness, numbness, dizziness or gait instability.  Patient's pain is over the right wrist, ulnar side.   Home Medications Prior to Admission medications   Medication Sig Start Date End Date Taking? Authorizing Provider  acetaminophen (TYLENOL) 325 MG tablet Take 650 mg by mouth every 6 (six) hours as needed (pain).    [provider]  amLODipine (NORVASC) 2.5 MG tablet Take 2.5 mg by mouth daily. 07/17/21   [provider]  apixaban (ELIQUIS) 5 MG TABS tablet Take 1 tablet (5 mg total) by mouth 2 (two) times daily. 10/25/19   [place], Eric J, DO  atorvastatin (LIPITOR) 40 MG tablet Take 1 tablet (40 mg total) by mouth daily with supper. 10/25/19   [place], Alvira Philips, DO  calcium citrate (CALCITRATE - DOSED IN MG ELEMENTAL CALCIUM) 950 MG tablet Take 200 mg of elemental calcium by mouth daily.    [provider]  furosemide (LASIX) 20 MG tablet Take 0.5 tablets (10 mg total) by mouth in the morning. 10/26/19   [place], Alvira Philips, DO  hydrALAZINE (APRESOLINE) 10 MG tablet Take 1 tablet (10 mg total) by mouth 3 (three) times daily as needed (significantly elevated Blood pressures). 11/23/19   Tegeler, Canary Brim, MD   latanoprost (XALATAN) 0.005 % ophthalmic solution Place 1 drop into both eyes at bedtime.  04/17/11   [provider]  loratadine (CLARITIN) 10 MG tablet Take 1 tablet (10 mg total) by mouth daily. 10/25/19 12/07/19  [place], Alvira Philips, DO  moxifloxacin (VIGAMOX) 0.5 % ophthalmic solution     [provider]  Multiple Vitamin (MULTIVITAMIN WITH MINERALS) TABS tablet Take 1 tablet by mouth daily.    [provider]  nitroGLYCERIN (NITROSTAT) 0.4 MG SL tablet Place 1 tablet (0.4 mg total) under the tongue every 5 (five) minutes as needed for chest pain. 10/25/19 08/06/21  [place], Eric J, DO  olmesartan (BENICAR) 40 MG tablet Take 40 mg by mouth at bedtime.    [provider]  prednisoLONE acetate (PRED FORTE) 1 % ophthalmic suspension     [provider]  propranolol (INDERAL) 10 MG tablet Take 1 tablet (10 mg total) by mouth 2 (two) times daily with a meal. 10/25/19 08/06/21  [place], Alvira Philips, DO  vitamin B-12 (CYANOCOBALAMIN) 1000 MCG tablet Take 1,000 mcg by mouth daily.    [provider]  Vitamin D, Ergocalciferol, (DRISDOL) 1.25 MG (50000 UNIT) CAPS capsule Take 50,000 Units by mouth every 7 (seven) days. On Wednesday    [provider]  vitamin E 180 MG (400 UNITS) capsule Take 400 Units by mouth daily.    [provider]  Allergies    Clindamycin/lincomycin and Nsaids    Review of Systems   Review of Systems  Physical Exam Updated Vital Signs BP (!) 147/79 (BP Location: Left Arm)   Pulse 67   Temp 98.3 F (36.8 C) (Oral)   Resp 16   Wt 67.6 kg   SpO2 96%   BMI 27.25 kg/m  Physical Exam Vitals and nursing note reviewed.  Constitutional:      Appearance: She is well-developed.  HENT:     Head: Atraumatic.  Cardiovascular:     Rate and Rhythm: Normal rate.  Pulmonary:     Effort: Pulmonary effort is normal.  Musculoskeletal:        General: Tenderness and signs of injury present. No deformity.     Cervical  back: Normal range of motion and neck supple.     Comments: Patient has tenderness over the right wrist, just distal to the ulnar styloid. There are superficial skin abrasions to the wrist area.  Skin:    General: Skin is warm and dry.     Findings: Erythema and rash present.  Neurological:     Mental Status: She is alert and oriented to person, place, and time.     ED Results / Procedures / Treatments   Labs (all labs ordered are listed, but only abnormal results are displayed) Labs Reviewed - No data to display  EKG None  Radiology DG Hand Complete Right  Result Date: 10/11/2022 CLINICAL DATA:  89 year old female with history of trauma from a fall. Right hand pain. EXAM: RIGHT HAND - COMPLETE 3+ VIEW COMPARISON:  No priors. FINDINGS: Three views of the right hand demonstrate no acute displaced fracture or dislocation. There is multifocal joint space narrowing, subchondral sclerosis, subchondral cyst formation and osteophyte formation, most severe at the first [facility] joint. IMPRESSION: 1. No acute radiographic abnormality of the right hand. 2. Degenerative changes of osteoarthritis most severe at the first [facility] joint, as above. Electronically Signed   By: Trudie Reed M.D.   On: 10/11/2022 10:24   CT Cervical Spine Wo Contrast  Result Date: 10/11/2022 CLINICAL DATA:  89 year old female status post fall yesterday morning. Right side injury. On Eliquis. EXAM: CT CERVICAL SPINE WITHOUT CONTRAST TECHNIQUE: Multidetector CT imaging of the cervical spine was performed without intravenous contrast. Multiplanar CT image reconstructions were also generated. RADIATION DOSE REDUCTION: This exam was performed according to the departmental dose-optimization program which includes automated exposure control, adjustment of the mA and/or kV according to patient size and/or use of iterative reconstruction technique. COMPARISON:  Head CT today. FINDINGS: Alignment: Preserved cervical lordosis.  Cervicothoracic junction alignment is within normal limits. Bilateral posterior element alignment is within normal limits. Skull base and vertebrae: Visualized skull base is intact. No atlanto-occipital dissociation. C1 and C2 appear intact and aligned. No acute osseous abnormality identified. Left TMJ degeneration. Soft tissues and spinal canal: No prevertebral fluid or swelling. No visible canal hematoma. Negative visible noncontrast neck soft tissues except for carotid bifurcation calcified atherosclerosis. Disc levels: Cervical spine degeneration with bulky anterior endplate osteophytes at C4-C5. Bulky posterior disc osteophyte disease at C6-C7 resulting in mild spinal stenosis with evidence of mild spinal cord mass effect. Upper chest: Visible upper thoracic levels appear intact. Mild apical lung scarring more so on the right. IMPRESSION: 1. No acute traumatic injury identified in the cervical spine. 2. Cervical spine degeneration with mild spinal stenosis, mild spinal cord mass effect suspected at C6-C7. Electronically Signed   By: Althea Grimmer.D.  On: 10/11/2022 10:22   CT Head Wo Contrast  Result Date: 10/11/2022 CLINICAL DATA:  89 year old female status post fall yesterday morning. Right side injury. On Eliquis. EXAM: CT HEAD WITHOUT CONTRAST TECHNIQUE: Contiguous axial images were obtained from the base of the skull through the vertex without intravenous contrast. RADIATION DOSE REDUCTION: This exam was performed according to the departmental dose-optimization program which includes automated exposure control, adjustment of the mA and/or kV according to patient size and/or use of iterative reconstruction technique. COMPARISON:  Brain MRI and head CT 11/23/2019. FINDINGS: Brain: Stable cerebral volume since 2021. No midline shift, ventriculomegaly, mass effect, evidence of mass lesion, intracranial hemorrhage or evidence of cortically based acute infarction. Stable gray-white matter differentiation  throughout the brain. Mild to moderate for age cerebral white matter heterogeneity, most pronounced in the left corona radiata as before. Vascular: No suspicious intracranial vascular hyperdensity. Calcified atherosclerosis at the skull base. Skull: Stable.  No acute osseous abnormality identified. Sinuses/Orbits: Visualized paranasal sinuses and mastoids are stable and well aerated. Other: No orbit or scalp soft tissue injury identified. IMPRESSION: 1. No acute intracranial abnormality or acute traumatic injury identified. 2. Stable non contrast CT appearance of small vessel disease since 2021. Electronically Signed   By: Odessa Fleming M.D.   On: 10/11/2022 10:19    Procedures Procedures    Medications Ordered in ED Medications - No data to display  ED Course/ Medical Decision Making/ A&P                                 Medical Decision Making Amount and/or Complexity of Data Reviewed Radiology: ordered.   89 year old patient comes in with chief complaint of mechanical fall.  She is complaining of wrist pain.  Patient is also on blood thinners.  No red flags suggesting elevated ICP. I feel comfortable clearing the C-spine clinically.  CT scan of the brain ordered, it is negative for acute bleed.  X-ray of the hand was ordered in triage, I do not see any evidence of clear fracture.  Although I think wrist x-ray would have been better, I do not think based on my assessment that we need to add another x-ray of the wrist.  Differential diagnosis for this patient includes wrist contusion, hairline fracture, wrist sprain.  Treatment recommendation is RICE right now.  Will put Velcro splint on and advise follow-up with orthopedist if not getting better over 2 weeks.  Final Clinical Impression(s) / ED Diagnoses Final diagnoses:  Fall, initial encounter  Contusion of right wrist, initial encounter    Rx / DC Orders ED Discharge Orders     None         Derwood Kaplan, MD 10/11/22  1108

## 2022-10-11 NOTE — ED Triage Notes (Addendum)
BIB family from s/p fall, slipped in water yesterday at 8am, hit R parietal head, and R hand (5th MC bruising and soreness). Rates soreness as mild-moderate. Takes eliquis for past MI/CAD. ES tylenol taken this am. Alert, NAD, calm, interactive, steady gait. Tremor noted. Good bilateral grip strength, no deformity. Denies syncope, dizziness, sob, NVD, fever, recent illness, bleeding or weakness.

## 2022-10-11 NOTE — Discharge Instructions (Signed)
You were seen in the emergency room for fall.  The CT scan of your brain is negative for bleed. There is no evidence of clear fracture over your wrist.  Please follow the RICE recommendations.  Ice the injured area.  Take over-the-counter medications for pain control.  If your symptoms are not getting better over the next 7 days, follow-up with the orthopedic doctors.

## 2022-10-27 ENCOUNTER — Other Ambulatory Visit (HOSPITAL_COMMUNITY): Payer: Self-pay | Admitting: *Deleted

## 2022-10-28 ENCOUNTER — Ambulatory Visit (HOSPITAL_COMMUNITY)
Admission: RE | Admit: 2022-10-28 | Discharge: 2022-10-28 | Disposition: A | Discharge status: Home / Self Care | Payer: Medicare Other | Source: Ambulatory Visit | Attending: Internal Medicine | Admitting: Internal Medicine

## 2022-10-28 DIAGNOSIS — M810 Age-related osteoporosis without current pathological fracture: Secondary | ICD-10-CM | POA: Diagnosis not present

## 2022-10-28 MED ORDER — DENOSUMAB 60 MG/ML ~~LOC~~ SOSY
PREFILLED_SYRINGE | SUBCUTANEOUS | Status: AC
  Administered 2022-10-28: 60 mg
  Filled 2022-10-28: qty 1

## 2023-01-25 NOTE — Progress Notes (Unsigned)
Cardiology Office Note:  .   Date:  01/27/2023  ID:  Charlene Dickerson, DOB 1933-01-21, MRN [id number] PCP: Cleatis Polka., MD  [facility] Providers Cardiologist:  Kristeen Miss, MD    Patient Profile: .      PMH Coronary artery disease S/p STEMI 01/2018 >>acute inferior wall MI Stenting to mid/distal RCA Mild to moderate LAD and LCx disease PAF On chronic anticoagulation Hyperlipidemia Hypertension Low vitamin B12 level Chronic combined systolic and diastolic heart failure Cognitive impairment/TIAs  Hospital admission 01/2018 with acute inferior wall myocardial infarction complicated by right ventricular infarction.  She had stenting to the mid/distal RCA, mild to moderate LAD and LCx disease.  She initially had lots of problems with hypotension and required Levophed for several days while hospitalized.  She had PAF and converted back to sinus rhythm on 02/23/2018.  She made a remarkable recovery and has maintained consistent follow-up with Dr. Elease Hashimoto.  She underwent successful cardioversion in February 2020 for atrial fibrillation.  She was hospitalized April 2021 with generalized weakness, found to have UTI and developed acute renal failure, hyperkalemia and then ventricular tachycardia.  Her potassium was 7.0 at the time.  She had AKI with creatinine of 5.8.  She did not lose pulse.  Echocardiogram 06/20/2019 revealed normal LVEF, G1 DD, normal RV, PA pressures upper limits of normal, and moderate aortic insufficiency.  Possible TIA/cognitive impairment October 2021, MRI was unremarkable. Carotid duplex was normal.  Seen by Dr. Kirke Corin 08/2019 for lower extremity arterial study that showed occluded posterior tibial artery distally, however [name] was normal and there was no evidence of obstructive disease in the major vessels above the knee.  Medical management was recommended.  Last cardiology clinic visit was with me on 08/21/2022, accompanied by her daughter Charlene Dickerson.  She was  having shortness of breath that had been present since MI in 2019.  She stopped being as active following the MI due to fear.  She walks around inside occasionally but no formal exercise.  Is somewhat limited by chronic back pain secondary to back fractures and scoliosis.  She was not having any chest pain or anginal symptoms.  Home SBP typically 116-120s in the afternoons, a little higher up to 135 mmHg in the mornings.  She was euvolemic with no evidence of volume overload.  We discussed potentially repeating echo to evaluate for worsening valve disease but she did not wish to schedule at this time. She was asked to consider adding ezetimibe 10 mg daily for LDL goal < 55 - was having lab work with PCP shortly after our visit.        History of Present Illness: .   Charlene Dickerson is a very pleasant 89 y.o. female who is here today with her daughter Charlene Dickerson. She reports she is feeling well.  She continues to live alone with help from her family as needed.  She is having some  left leg pain that extends from the groin to her toes and is associated with numbness. She denies any changes in Dickerson color or temperature. Continues to have occasional shortness of breath, particularly when exerting herself or walking short distances. However, she is mostly sedentary and does not experience significant dyspnea. PCP started her on Zetia for elevated LDL, no concerning side effects. Repeat lab work from 12/30/22 reviewed. She denies bleeding concerns. Reports good appetite and hydration. She denies dizziness, lightheadedness, presyncope, syncope, chest pain, palpitations, orthopnea, PND, or edema. No concerns with any of her  cardiac medications.   Discussed the use of AI scribe software for clinical note transcription with the patient, who gave verbal consent to proceed.   ROS: See HPI       Studies Reviewed: .        Risk Assessment/Calculations:    CHA2DS2-VASc Score = 6   This indicates a 9.7% annual risk of  stroke. The patient's score is based upon: CHF History: 1 HTN History: 1 Diabetes History: 0 Stroke History: 0 Vascular Disease History: 1 Age Score: 2 Gender Score: 1            Physical Exam:   VS:  BP 122/84   Pulse 69   Ht 5\' 2"  (1.575 m)   Wt 152 lb 6.4 oz (69.1 kg)   SpO2 97%   BMI 27.87 kg/m    Wt Readings from Last 3 Encounters:  01/27/23 152 lb 6.4 oz (69.1 kg)  10/11/22 149 lb (67.6 kg)  08/21/22 151 lb (68.5 kg)    GEN: Well nourished, well developed in no acute distress NECK: No JVD; No carotid bruits CARDIAC: RRR, soft systolic murmur. No rubs, gallops RESPIRATORY:  Clear to auscultation without rales, wheezing or rhonchi  ABDOMEN: Soft, non-tender, non-distended EXTREMITIES:  No edema; No deformity     ASSESSMENT AND PLAN: .    CAD without angina: S/p inferior STEMI 01/2018 with RV failure and DES x 3 to prox and mid RCA, residual calcified lesion to LAD and prox to mid LCx 80% stenosis. She reports shortness of breath since that time which is stable and has not worsened.  She denies chest pain or other symptoms concerning for angina.  No indication for further ischemia evaluation at this time.  Continue GDMT including propranolol, ezetimibe, amlodipine, atorvastatin, and olmesartan. She is not on anti-platelet therapy in the setting of OAC for a fib.   PAF on chronic anticoagulation: She clinically appears to be in sinus rhythm today. No tachy palpitations or other concerns for more frequent episodes of a fib. HR is well controlled. No bleeding concerns. Continue Eliquis 5 mg twice daily for stroke prevention for CHA2DS2-VASc score of 6.  Continue low-dose beta-blocker for rate control.  Hyperlipidemia LDL goal < 55: Zetia started by PCP a few months ago. Lipid panel completed 12/30/2022 which revealed total cholesterol 142, triglycerides 64, HDL 79, and LDL 50.  ALT was 17. LDL is now at goal of < 55.  She is having no concerning side effects on ezetimibe and  atorvastatin so we will continue these.  Hypertension: BP is well controlled.  Stable renal function on labs completed 12/30/2022. No medication changes today.   Chronic combined CHF/ICM: LVEF 60 to 65%, G1 DD, mildly elevated PASP on echo 06/2019.  She has chronic shortness of breath that she feels is stable. She denies dyspnea, orthopnea, PND, edema.  She appears euvolemic on exam. No indication of worsening heart function. Continue GDMT including olmesartan, furosemide, propranolol.   Valve disease: She has a soft murmur on exam.  Moderate TR and moderate AI on echo 06/2019.  She has chronic shortness of breath that she feels is stable.  No indication of worsening valve function.  We discussed echocardiogram for surveillance but will hold off for now.       Dispo: 6 months with me  Signed, Eligha Bridegroom, NP-C

## 2023-01-27 ENCOUNTER — Encounter: Payer: Self-pay | Admitting: Nurse Practitioner

## 2023-01-27 ENCOUNTER — Ambulatory Visit: Payer: Medicare Other | Attending: Nurse Practitioner | Admitting: Nurse Practitioner

## 2023-01-27 VITALS — BP 122/84 | HR 69 | Ht 62.0 in | Wt 152.4 lb

## 2023-01-27 DIAGNOSIS — I10 Essential (primary) hypertension: Secondary | ICD-10-CM | POA: Diagnosis not present

## 2023-01-27 DIAGNOSIS — I480 Paroxysmal atrial fibrillation: Secondary | ICD-10-CM | POA: Diagnosis not present

## 2023-01-27 DIAGNOSIS — E785 Hyperlipidemia, unspecified: Secondary | ICD-10-CM

## 2023-01-27 DIAGNOSIS — I5042 Chronic combined systolic (congestive) and diastolic (congestive) heart failure: Secondary | ICD-10-CM

## 2023-01-27 DIAGNOSIS — Z7901 Long term (current) use of anticoagulants: Secondary | ICD-10-CM | POA: Diagnosis not present

## 2023-01-27 DIAGNOSIS — I255 Ischemic cardiomyopathy: Secondary | ICD-10-CM

## 2023-01-27 DIAGNOSIS — I361 Nonrheumatic tricuspid (valve) insufficiency: Secondary | ICD-10-CM

## 2023-01-27 DIAGNOSIS — I351 Nonrheumatic aortic (valve) insufficiency: Secondary | ICD-10-CM

## 2023-01-27 DIAGNOSIS — I2510 Atherosclerotic heart disease of native coronary artery without angina pectoris: Secondary | ICD-10-CM

## 2023-01-27 NOTE — Patient Instructions (Signed)
Medication Instructions:  Your physician recommends that you continue on your current medications as directed. Please refer to the Current Medication list given to you today.  *If you need a refill on your cardiac medications before your next appointment, please call your pharmacy*   Lab Work: None ordered. If you have labs (blood work) drawn today and your tests are completely normal, you will receive your results only by: MyChart Message (if you have MyChart) OR A paper copy in the mail If you have any lab test that is abnormal or we need to change your treatment, we will call you to review the results.   Testing/Procedures: None ordered.   Follow-Up: At [facility], you and your health needs are our priority.  As part of our continuing mission to provide you with exceptional heart care, we have created designated Provider Care Teams.  These Care Teams include your primary Cardiologist (physician) and Advanced Practice Providers (APPs -  Physician Assistants and Nurse Practitioners) who all work together to provide you with the care you need, when you need it.  We recommend signing up for the patient portal called "MyChart".  Sign up information is provided on this After Visit Summary.  MyChart is used to connect with patients for Virtual Visits (Telemedicine).  Patients are able to view lab/test results, encounter notes, upcoming appointments, etc.  Non-urgent messages can be sent to your provider as well.   To learn more about what you can do with MyChart, go to [email or web address].    Your next appointment:   6 month(s)  Provider:   Eligha Bridegroom, NP         Other Instructions Your physician wants you to follow-up in: 6 months. You will receive a reminder letter in the mail two months in advance. If you don't receive a letter, please call our office to schedule the follow-up appointment.

## 2023-04-09 ENCOUNTER — Ambulatory Visit: Payer: Medicare Other | Admitting: Orthopaedic Surgery

## 2023-04-09 ENCOUNTER — Other Ambulatory Visit: Payer: Self-pay

## 2023-04-09 ENCOUNTER — Encounter: Payer: Self-pay | Admitting: Orthopaedic Surgery

## 2023-04-09 ENCOUNTER — Other Ambulatory Visit (INDEPENDENT_AMBULATORY_CARE_PROVIDER_SITE_OTHER): Payer: Medicare Other

## 2023-04-09 DIAGNOSIS — M25562 Pain in left knee: Secondary | ICD-10-CM

## 2023-04-09 MED ORDER — METHYLPREDNISOLONE ACETATE 80 MG/ML IJ SUSP
1.0000 mL | INTRAMUSCULAR | Status: AC | PRN
Start: 2023-04-09 — End: 2023-04-09
  Administered 2023-04-09: 1 mL via INTRA_ARTICULAR

## 2023-04-09 MED ORDER — LIDOCAINE HCL 1 % IJ SOLN
3.0000 mL | INTRAMUSCULAR | Status: AC | PRN
Start: 2023-04-09 — End: 2023-04-09
  Administered 2023-04-09: 3 mL

## 2023-04-09 MED ORDER — METHYLPREDNISOLONE ACETATE 40 MG/ML IJ SUSP
40.0000 mg | INTRAMUSCULAR | Status: AC | PRN
  Administered 2023-04-09: 40 mg via INTRA_ARTICULAR

## 2023-04-09 NOTE — Patient Instructions (Signed)
 Please take it easy today and tomorrow.  You may continue to ice the area as that will help decrease some of the inflammation and swelling.  It is important that you provide compression over your knee, I would do this throughout most of the day and at night if you feel like it is getting swollen throughout the night into the morning.  You may use some topical Voltaren over the knee.  If in 3 weeks you feel like the pain in the posterior aspect of your knee is still bothering you then please come back and see us  here in the clinic, ideally on a Thursday as Dr. Vita will also be here and we can try and aspirate the Baker's cyst.

## 2023-04-09 NOTE — Progress Notes (Signed)
 Office Visit Note   Patient: Charlene Dickerson           Date of Birth: [birth year]           MRN: [id number] Visit Date: 04/09/2023              Requested by: Loreli Elsie JONETTA Mickey., MD [address] [place],  [place] [address] PCP: Loreli Elsie JONETTA Mickey., MD   Assessment & Plan: Visit Diagnoses:  1. Left knee pain, unspecified chronicity     Plan: Patient with noted Baker's cyst on ultrasound as well as some swelling of the knee.  After reviewing x-rays as well as discussion with patient, patient pain could be related to some osteoarthritic changes.  Discussed with patient that Baker's cyst can usually refill if aspirated.  Both patient and patient's daughter are agreeable to go ahead and do aspiration of the knee joint and steroid injection today.  If there is little to no improvement in the next 3 weeks, patient come back and at that time we will aspirate the Baker's cyst itself.  Patient tolerated procedure well without any difficulty and is agreeable with plan.  I have seen and examined the patient and agree with Dr. Vita in terms of his evaluation and plan.  He is really excellent at ultrasound assessment of the knee with the Baker's cyst as well.  We talked to the patient in detail as well as her daughter.  Will see her back in 3 weeks to see how she is responded to the steroid injection.  Follow-Up Instructions: Return in about 3 weeks (around 04/30/2023).   Orders:  Orders Placed This Encounter  Procedures   Large Joint Inj   XR Knee 1-2 Views Left   US  Guided Needle Placement - No Linked Charges   No orders of the defined types were placed in this encounter.     Procedures: Large Joint Inj on 04/09/2023 2:20 PM Indications: pain Details: 22 G 1.5 in needle, superolateral approach Medications: 3 mL lidocaine  1 %; 1 mL methylPREDNISolone  acetate 80 MG/ML; 40 mg methylPREDNISolone  acetate 40 MG/ML Aspirate: serous      Clinical Data: No additional  findings.   Subjective: Chief Complaint  Patient presents with   Left Knee - Pain    Patient here alongside daughter.  Patient notes that she has been dealing with some left-sided knee pain that has been worsening over the past 6 months.  Patient notes that she has some tightness and fullness throughout the knee as well as in the posterior aspect specifically.  Patient states that she saw her primary care doctor who noted she does have a Baker's cyst advised her to follow-up with Dr. Vernetta.    Review of Systems   Objective: Vital Signs: There were no vitals taken for this visit.  Physical Exam  Ortho Exam Inspection of the left knee shows some swelling of the left knee compared to the right.  No signs of any redness, warmth or noted infection.  There are some tenderness to palpation along the medial joint line on the left side.  Also some tenderness over the patella tendon.  Range of motion is full though there is some pain noted with flexion of the knee and full extension as well.  Strength is decreased due to pain with flexion extension. Specialty Comments:  No specialty comments available.  Imaging: US  Guided Needle Placement - No Linked Charges Result Date: 04/09/2023 Ultrasound left knee: Ultrasound left knee shows a  notable Baker's cyst in the posterior aspect of the knee.  Cyst appears hypoechoic and this is consistent with an effusion.  No notable septations noted. Impression: Hypoechoic changes in the posterior aspect the knee consistent with Baker's cyst.  XR Knee 1-2 Views Left Result Date: 04/09/2023 X-ray of the bilateral knees show some mild joint space narrowing medially with some sclerotic changes over the tibial plateaus bilaterally.  Some spurring noted.  No signs of any acute fractures.  Patellofemoral narrowing noted more so at the distal aspect.    PMFS History: Patient Active Problem List   Diagnosis Date Noted   Microscopic hematuria 01/23/2020   Mild  neurocognitive disorder due to multiple etiologies 01/23/2020   Transient ischemic attack (TIA)    History of delirium    Generalized anxiety disorder    Vitamin B12 deficiency    Ventricular tachycardia 07/15/2019   Malnutrition of moderate degree 06/21/2019   Hyperkalemia 06/20/2019   Metabolic acidosis 06/20/2019   Uremia 06/20/2019   CAD (coronary artery disease) 06/20/2019   Dehydration 06/20/2019   Chronic diastolic CHF (congestive heart failure) 06/20/2019   Essential hypertension 06/20/2019   Low back pain 06/01/2019   Paroxysmal atrial fibrillation    Shock circulatory    STEMI (ST elevation myocardial infarction) 02/18/2018   Complete heart block    Cystocele with rectocele 11/26/2017   Diffuse cystic mastopathy of breast 05/09/2011   Past Medical History:  Diagnosis Date   AKI (acute kidney injury) 06/20/2019   CAD (coronary artery disease) 06/20/2019   Chronic diastolic CHF (congestive heart failure) 06/20/2019   Closed compression fracture of L4 lumbar vertebra 06/20/2019   Complete heart block    Cystocele with rectocele 11/26/2017   Diffuse cystic mastopathy of breast 05/09/2011   Diverticulosis of colon    Essential hypertension 06/20/2019   Generalized anxiety disorder    GERD (gastroesophageal reflux disease)    occasional (no meds)   History of basal cell carcinoma excision 2012   History of delirium    Hyperkalemia 06/20/2019   Low back pain 06/01/2019   Malnutrition of moderate degree 06/21/2019   Metabolic acidosis 06/20/2019   Microscopic hematuria 01/23/2020   Mild neurocognitive disorder due to multiple etiologies 01/23/2020   Likely a combination of a vascular etiology and deficits related to prior delirium   Osteoporosis    Paroxysmal atrial fibrillation    PONV (postoperative nausea and vomiting)    Shock circulatory    STEMI (ST elevation myocardial infarction) 02/18/2018   Transient ischemic attack (TIA)    Uremia 06/20/2019   Ventricular  tachycardia 07/15/2019   Vitamin B12 deficiency     Family History  Problem Relation Age of Onset   Heart disease Sister    Breast cancer Sister    Heart disease Father    Breast cancer Daughter    Breast cancer Maternal Aunt    Breast cancer Maternal Grandmother    Pancreatic cancer Sister     Past Surgical History:  Procedure Laterality Date   BREAST EXCISIONAL BIOPSY Left 1995   BREAST EXCISIONAL BIOPSY Right 1988   CARDIOVERSION N/A 04/16/2018   Procedure: CARDIOVERSION;  Surgeon: Loni Soyla LABOR, MD;  Location: [facility] ENDOSCOPY;  Service: Cardiovascular;  Laterality: N/A;   CATARACT EXTRACTION W/ INTRAOCULAR LENS  IMPLANT, BILATERAL  right 09/ 2013;  left 10/ 2013   COLONOSCOPY  last one 04/10/ 2012   CORONARY STENT INTERVENTION N/A 02/18/2018   Procedure: CORONARY STENT INTERVENTION;  Surgeon: Court Dorn PARAS, MD;  Location: [facility] INVASIVE CV LAB;  Service: Cardiovascular;  Laterality: N/A;   D & C HYSTEROSCOPY W/ RESECTION ENDOMETRIAL POLYP AND LESION  07-27-2007   dr austin @[facility]    IR KYPHO LUMBAR INC FX REDUCE BONE BX UNI/BIL CANNULATION INC/IMAGING  07/11/2019   LEFT HEART CATH AND CORONARY ANGIOGRAPHY N/A 02/18/2018   Procedure: LEFT HEART CATH AND CORONARY ANGIOGRAPHY;  Surgeon: Court Dorn PARAS, MD;  Location: [facility] INVASIVE CV LAB;  Service: Cardiovascular;  Laterality: N/A;   RECTOCELE REPAIR N/A 11/26/2017   Procedure: POSTERIOR REPAIR (RECTOCELE);  Surgeon: Austin Ned, MD;  Location: [facility];  Service: Gynecology;  Laterality: N/A;  OUTPT IN BED   TEMPORARY PACEMAKER N/A 02/18/2018   Procedure: TEMPORARY PACEMAKER;  Surgeon: Court Dorn PARAS, MD;  Location: [facility] INVASIVE CV LAB;  Service: Cardiovascular;  Laterality: N/A;   TUBAL LIGATION Bilateral yrs ago   Social History   Occupational History   Occupation: Retired  Tobacco Use   Smoking status: Former    Current packs/day: 0.00    Types: Cigarettes    Quit date: 05/08/1961    Years since  quitting: 61.9   Smokeless tobacco: Never  Vaping Use   Vaping status: Never Used  Substance and Sexual Activity   Alcohol use: No   Drug use: Never   Sexual activity: Not on file

## 2023-04-30 ENCOUNTER — Other Ambulatory Visit (HOSPITAL_COMMUNITY): Payer: Self-pay | Admitting: *Deleted

## 2023-05-04 ENCOUNTER — Telehealth: Payer: Self-pay | Admitting: *Deleted

## 2023-05-04 ENCOUNTER — Ambulatory Visit (HOSPITAL_COMMUNITY)
Admission: RE | Admit: 2023-05-04 | Discharge: 2023-05-04 | Disposition: A | Discharge status: Home / Self Care | Payer: Medicare Other | Source: Ambulatory Visit | Attending: Internal Medicine | Admitting: Internal Medicine

## 2023-05-04 DIAGNOSIS — M810 Age-related osteoporosis without current pathological fracture: Secondary | ICD-10-CM | POA: Diagnosis present

## 2023-05-04 MED ORDER — DENOSUMAB 60 MG/ML ~~LOC~~ SOSY
60.0000 mg | PREFILLED_SYRINGE | Freq: Once | SUBCUTANEOUS | Status: AC
  Administered 2023-05-04: 60 mg via SUBCUTANEOUS

## 2023-05-04 MED ORDER — DENOSUMAB 60 MG/ML ~~LOC~~ SOSY
PREFILLED_SYRINGE | SUBCUTANEOUS | Status: DC
Start: 2023-05-04 — End: 2023-05-05
  Filled 2023-05-04: qty 1

## 2023-05-04 NOTE — Telephone Encounter (Signed)
 Lvm will call pt back to see if pt wants 6 month f/u with Marcelino Duster at [facility].

## 2023-05-04 NOTE — Telephone Encounter (Signed)
-----   Message from [facility] Vernadine Coombs G sent at 01/27/2023 10:51 AM EST ----- Send pt mychart message for 6 months in May with MSW like late am or early pm.

## 2023-05-04 NOTE — Telephone Encounter (Signed)
 Called pt twice to let pt know Marcelino Duster is moving to [facility] and if pt wants to be seen at that location of [address] location.

## 2023-08-03 ENCOUNTER — Encounter (HOSPITAL_BASED_OUTPATIENT_CLINIC_OR_DEPARTMENT_OTHER): Payer: Self-pay | Admitting: Nurse Practitioner

## 2023-08-03 ENCOUNTER — Ambulatory Visit (INDEPENDENT_AMBULATORY_CARE_PROVIDER_SITE_OTHER): Admitting: Nurse Practitioner

## 2023-08-03 VITALS — BP 138/76 | HR 75 | Ht 62.0 in | Wt 156.0 lb

## 2023-08-03 DIAGNOSIS — I255 Ischemic cardiomyopathy: Secondary | ICD-10-CM

## 2023-08-03 DIAGNOSIS — I2510 Atherosclerotic heart disease of native coronary artery without angina pectoris: Secondary | ICD-10-CM

## 2023-08-03 DIAGNOSIS — Z7901 Long term (current) use of anticoagulants: Secondary | ICD-10-CM

## 2023-08-03 DIAGNOSIS — E785 Hyperlipidemia, unspecified: Secondary | ICD-10-CM | POA: Diagnosis not present

## 2023-08-03 DIAGNOSIS — I480 Paroxysmal atrial fibrillation: Secondary | ICD-10-CM | POA: Diagnosis not present

## 2023-08-03 DIAGNOSIS — I361 Nonrheumatic tricuspid (valve) insufficiency: Secondary | ICD-10-CM | POA: Diagnosis not present

## 2023-08-03 DIAGNOSIS — I351 Nonrheumatic aortic (valve) insufficiency: Secondary | ICD-10-CM

## 2023-08-03 DIAGNOSIS — I5042 Chronic combined systolic (congestive) and diastolic (congestive) heart failure: Secondary | ICD-10-CM

## 2023-08-03 DIAGNOSIS — I10 Essential (primary) hypertension: Secondary | ICD-10-CM

## 2023-08-03 NOTE — Progress Notes (Signed)
 Cardiology Office Note:  .   Date:  08/03/2023  ID:  Charlene Dickerson, DOB February 11, 1933, MRN [id number] PCP: Jeannine Milroy., MD  [facility] Providers Cardiologist:  Ahmad Alert, MD    Patient Profile: .      PMH Coronary artery disease S/p STEMI 01/2018 >>acute inferior wall MI Stenting to mid/distal RCA Mild to moderate LAD and LCx disease PAF On chronic anticoagulation Hyperlipidemia Hypertension Low vitamin B12 level Chronic combined systolic and diastolic heart failure Cognitive impairment/TIAs  Hospital admission 01/2018 with acute inferior wall myocardial infarction complicated by right ventricular infarction.  She had stenting to the mid/distal RCA, mild to moderate LAD and LCx disease.  She initially had lots of problems with hypotension and required Levophed  for several days while hospitalized.  She had PAF and converted back to sinus rhythm on 02/23/2018.  She made a remarkable recovery and has maintained consistent follow-up with Dr. Alroy Aspen.  She underwent successful cardioversion in February 2020 for atrial fibrillation.  She was hospitalized April 2021 with generalized weakness, found to have UTI and developed acute renal failure, hyperkalemia and then ventricular tachycardia.  Her potassium was 7.0 at the time.  She had AKI with creatinine of 5.8.  She did not lose pulse.  Echocardiogram 06/20/2019 revealed normal LVEF, G1 DD, normal RV, PA pressures upper limits of normal, and moderate aortic insufficiency.  Possible TIA/cognitive impairment October 2021, MRI was unremarkable. Carotid duplex was normal.  Seen by Dr. Alvenia Aus 08/2019 for lower extremity arterial study that showed occluded posterior tibial artery distally, however [name] was normal and there was no evidence of obstructive disease in the major vessels above the knee.  Medical management was recommended.  Seen by me on 08/21/2022, accompanied by her daughter Tanis Fan.  She was having shortness of breath that  had been present since MI in 2019.  She stopped being as active following the MI due to fear.  She walks around inside occasionally but no formal exercise.  Is somewhat limited by chronic back pain secondary to back fractures and scoliosis.  No chest pain or anginal symptoms.  Home SBP typically 116-120s in the afternoons, a little higher up to 135 mmHg in the mornings. Euvolemic with no evidence of volume overload.  We discussed potentially repeating echo to evaluate for worsening valve disease but she did not wish to schedule at this time. She was asked to consider adding ezetimibe 10 mg daily for LDL goal < 55 - was having lab work with PCP shortly after our visit.   Last cardiology clinic visit was 01/27/23 with me, she was accompanied by her daughter, Tanis Fan. Feeling well. Continuing to live alone with help from her family as needed.  Having some left leg pain that extends from the groin to her toes and is associated with numbness. No changes in foot color or temperature. Continues to have occasional shortness of breath, particularly when exerting herself or walking short distances. However, she is mostly sedentary and does not experience significant dyspnea. PCP started her on Zetia for elevated LDL, no concerning side effects. Repeat lab work from 12/30/22 reviewed. No bleeding concerns. Reports good appetite and hydration. No concerns with any of her cardiac medications.       History of Present Illness: .    History of Present Illness Charlene Dickerson is a very pleasant 89 y.o. female who is here today with her daughter Tanis Fan for follow-up of CAD. She reports she is feeling well and continues to  live independently.  She drives a short distance to the beauty shop once a week to get her hair done. She limits walking long distances due to concern for falling.  She enjoys walking around inside her home and watering her flowers outside. She does not want to use a cane or walker. Her daughter lives out of town  but is here frequently and  she has a Network engineer who lives next-door who helps care for her. She denies shortness of breath, chest pain, lightheadedness, palpitations, orthopnea, PND, presyncope or syncope. Occasional bilateral LE  edema is present, which fluctuates based on diet and activity level. She denies any bleeding problems, melena, or epistaxis. Her appetite is good, and she regularly checks pulse, blood pressure, oxygen saturation, and weight with BP readings around 133/70 mmHg. Hydralazine  is available for emergency use if her blood pressure exceeds 160 mmHg, but it has not been needed. She does not have any concerns with cardiac medications.    Discussed the use of AI scribe software for clinical note transcription with the patient, who gave verbal consent to proceed.   ROS: See HPI       Studies Reviewed: Aaron Aas   EKG Interpretation Date/Time:  Monday August 03 2023 14:49:42 EDT Ventricular Rate:  70 PR Interval:  182 QRS Duration:  76 QT Interval:  398 QTC Calculation: 429 R Axis:   -61  Text Interpretation: Sinus rhythm with frequent Premature ventricular complexes Left axis deviation Low voltage QRS Cannot rule out Anterior infarct , age undetermined No acute changes Confirmed by Slater Duncan [phone]) on 08/03/2023 4:37:24 PM    Risk Assessment/Calculations:    CHA2DS2-VASc Score = 6   This indicates a 9.7% annual risk of stroke. The patient's score is based upon: CHF History: 1 HTN History: 1 Diabetes History: 0 Stroke History: 0 Vascular Disease History: 1 Age Score: 2 Gender Score: 1            Physical Exam:   VS:  BP 138/76   Pulse 75   Ht 5\' 2"  (1.575 m)   Wt 156 lb (70.8 kg)   SpO2 94%   BMI 28.53 kg/m    Wt Readings from Last 3 Encounters:  08/03/23 156 lb (70.8 kg)  01/27/23 152 lb 6.4 oz (69.1 kg)  10/11/22 149 lb (67.6 kg)    GEN: Well nourished, well developed in no acute distress NECK: No JVD; No carotid bruits CARDIAC: RRR, soft systolic  murmur. No rubs, gallops RESPIRATORY:  Clear to auscultation without rales, wheezing or rhonchi  ABDOMEN: Soft, non-tender, non-distended EXTREMITIES:  No edema; No deformity     ASSESSMENT AND PLAN: .    CAD without angina: S/p inferior STEMI 01/2018 with RV failure and DES x 3 to prox and mid RCA, residual calcified lesion to LAD and prox to mid LCx 80% stenosis. She reports shortness of breath since that time which is stable and has not worsened. She remains active around her home. She denies chest pain or other symptoms concerning for angina.  No indication for further ischemia evaluation at this time. Continue GDMT including propranolol , ezetimibe, amlodipine, atorvastatin , and olmesartan. She is not on anti-platelet therapy in the setting of OAC for a fib.   PAF on chronic anticoagulation: EKG reveals NSR at 70 bpm. No tachy palpitations or other concerns for more frequent episodes of a fib. HR is well controlled. No bleeding concerns. Continue Eliquis  5 mg twice daily for stroke prevention for CHA2DS2-VASc score of 6. We will  recheck BMET and CBC today for surveillance. Continue low-dose beta-blocker for rate control.  Hyperlipidemia LDL goal < 55: Lipid panel completed 12/30/2022 revealed total cholesterol 142, triglycerides 64, HDL 79, and LDL 50.  ALT was 17. LDL is now at goal of < 55. We will repeat lipid panel in the setting of getting other lab values for renal function. Continue atorvastatin  and ezetimibe.  Hypertension: BP is well controlled. We will get BMET today to ensure stable renal function. No medication changes today.   Chronic combined CHF/ICM: LVEF 60 to 65%, G1 DD, mildly elevated PASP on echo 06/2019.  She has chronic shortness of breath that she feels is stable. She denies significant dyspnea, orthopnea, PND, edema.  She appears euvolemic on exam. No indication of worsening heart function. Continue GDMT including olmesartan, furosemide , propranolol .   Valve disease: Soft  murmur on exam.  Moderate TR and moderate AI on echo 06/2019. Chronic shortness of breath that she feels is stable.  No indication of worsening valve function. We will continue to monitor clinically for now.         Disposition: 9 months with me  Signed, Slater Duncan, NP-C

## 2023-08-03 NOTE — Patient Instructions (Addendum)
 Medication Instructions:   Your physician recommends that you continue on your current medications as directed. Please refer to the Current Medication list given to you today.   *If you need a refill on your cardiac medications before your next appointment, please call your pharmacy*  Lab Work:  TODAY!!!! LIPID/LDL DIRECT/CMET/CBC  If you have labs (blood work) drawn today and your tests are completely normal, you will receive your results only by: MyChart Message (if you have MyChart) OR A paper copy in the mail If you have any lab test that is abnormal or we need to change your treatment, we will call you to review the results.  Testing/Procedures:  None ordered.  Follow-Up: At [facility], you and your health needs are our priority.  As part of our continuing mission to provide you with exceptional heart care, our providers are all part of one team.  This team includes your primary Cardiologist (physician) and Advanced Practice Providers or APPs (Physician Assistants and Nurse Practitioners) who all work together to provide you with the care you need, when you need it.  Your next appointment:   10 month(s)  Provider:   Slater Duncan, NP    We recommend signing up for the patient portal called "MyChart".  Sign up information is provided on this After Visit Summary.  MyChart is used to connect with patients for Virtual Visits (Telemedicine).  Patients are able to view lab/test results, encounter notes, upcoming appointments, etc.  Non-urgent messages can be sent to your provider as well.   To learn more about what you can do with MyChart, go to [email or web address].   Other Instructions  Your physician wants you to follow-up in: 10 months.  You will receive a reminder letter in the mail two months in advance. If you don't receive a letter, please call our office to schedule the follow-up appointment.

## 2023-08-04 ENCOUNTER — Ambulatory Visit (HOSPITAL_BASED_OUTPATIENT_CLINIC_OR_DEPARTMENT_OTHER): Payer: Self-pay | Admitting: Nurse Practitioner

## 2023-08-04 LAB — COMPREHENSIVE METABOLIC PANEL WITH GFR
ALT: 20 IU/L (ref 0–32)
AST: 23 IU/L (ref 0–40)
Albumin: 4.4 g/dL (ref 3.7–4.7)
Alkaline Phosphatase: 50 IU/L (ref 44–121)
BUN/Creatinine Ratio: 18 (ref 12–28)
BUN: 14 mg/dL (ref 8–27)
Bilirubin Total: 0.5 mg/dL (ref 0.0–1.2)
CO2: 20 mmol/L (ref 20–29)
Calcium: 9.8 mg/dL (ref 8.7–10.3)
Chloride: 104 mmol/L (ref 96–106)
Creatinine, Ser: 0.8 mg/dL (ref 0.57–1.00)
Globulin, Total: 2.4 g/dL (ref 1.5–4.5)
Glucose: 89 mg/dL (ref 70–99)
Potassium: 4.5 mmol/L (ref 3.5–5.2)
Sodium: 141 mmol/L (ref 134–144)
Total Protein: 6.8 g/dL (ref 6.0–8.5)
eGFR: 71 mL/min/{1.73_m2} (ref >59)

## 2023-08-04 LAB — LDL CHOLESTEROL, DIRECT: LDL Direct: 56 mg/dL (ref 0–99)

## 2023-08-04 LAB — LIPID PANEL
Chol/HDL Ratio: 1.9 ratio (ref 0.0–4.4)
Cholesterol, Total: 151 mg/dL (ref 100–199)
HDL: 81 mg/dL (ref >39)
LDL Chol Calc (NIH): 53 mg/dL (ref 0–99)
Triglycerides: 91 mg/dL (ref 0–149)
VLDL Cholesterol Cal: 17 mg/dL (ref 5–40)

## 2023-08-04 LAB — CBC
Hematocrit: 43 % (ref 34.0–46.6)
Hemoglobin: 14.4 g/dL (ref 11.1–15.9)
MCH: 33.6 pg — ABNORMAL HIGH (ref 26.6–33.0)
MCHC: 33.5 g/dL (ref 31.5–35.7)
MCV: 100 fL — ABNORMAL HIGH (ref 79–97)
Platelets: 162 10*3/uL (ref 150–450)
RBC: 4.29 x10E6/uL (ref 3.77–5.28)
RDW: 11.9 % (ref 11.7–15.4)
WBC: 5.1 10*3/uL (ref 3.4–10.8)

## 2023-08-05 NOTE — Progress Notes (Signed)
 Order(s) created erroneously. Erroneous order ID: [id number]  Order moved by: Debbra Fairy  Order move date/time: 08/05/2023 3:10 PM  Source Patient: [id number]  Source Contact: 08/03/2023  Destination Patient: [id number]  Destination Contact: 08/13/2022

## 2023-10-14 ENCOUNTER — Telehealth (HOSPITAL_COMMUNITY): Payer: Self-pay | Admitting: Pharmacy Technician

## 2023-10-14 NOTE — Telephone Encounter (Signed)
 Auth Submission: APPROVED Site of care: [facility] Payer: [employer] Medicare Medication & CPT/J Code(s) submitted: Prolia (Denosumab) [id number] Diagnosis Code: M81.0 Route of submission (phone, fax, portal): Providers office obtained Phone # Fax # Auth type: Buy/Bill HB Units/visits requested: 60mg  x 2 doses, q 6 months Reference number: [id number] Approval from: 10/14/23 to 10/13/24    Dagoberto Armour, CPhT [facility] Phone: [phone] 10/14/2023

## 2023-11-05 ENCOUNTER — Other Ambulatory Visit (HOSPITAL_COMMUNITY): Payer: Self-pay | Admitting: *Deleted

## 2023-11-06 ENCOUNTER — Ambulatory Visit (HOSPITAL_COMMUNITY)
Admission: RE | Admit: 2023-11-06 | Discharge: 2023-11-06 | Disposition: A | Discharge status: Home / Self Care | Source: Ambulatory Visit | Attending: Internal Medicine | Admitting: Internal Medicine

## 2023-11-06 DIAGNOSIS — M810 Age-related osteoporosis without current pathological fracture: Secondary | ICD-10-CM | POA: Insufficient documentation

## 2023-11-06 MED ORDER — DENOSUMAB 60 MG/ML ~~LOC~~ SOSY
60.0000 mg | PREFILLED_SYRINGE | Freq: Once | SUBCUTANEOUS | Status: AC
  Administered 2023-11-06: 60 mg via SUBCUTANEOUS

## 2023-11-06 MED ORDER — DENOSUMAB 60 MG/ML ~~LOC~~ SOSY
PREFILLED_SYRINGE | SUBCUTANEOUS | Status: AC
  Filled 2023-11-06: qty 1

## 2024-01-04 ENCOUNTER — Encounter: Payer: Self-pay | Admitting: Radiology
